# Patient Record
Sex: Female | Born: 1975
Health system: Southern US, Community
[De-identification: ages and names within clinical notes are randomized; demographics above are authoritative.]

## PROBLEM LIST (undated history)

## (undated) DIAGNOSIS — F419 Anxiety disorder, unspecified: Secondary | ICD-10-CM

## (undated) DIAGNOSIS — K589 Irritable bowel syndrome without diarrhea: Secondary | ICD-10-CM

## (undated) DIAGNOSIS — I10 Essential (primary) hypertension: Secondary | ICD-10-CM

## (undated) DIAGNOSIS — K8020 Calculus of gallbladder without cholecystitis without obstruction: Secondary | ICD-10-CM

## (undated) DIAGNOSIS — K269 Duodenal ulcer, unspecified as acute or chronic, without hemorrhage or perforation: Secondary | ICD-10-CM

## (undated) DIAGNOSIS — Z1371 Encounter for nonprocreative screening for genetic disease carrier status: Secondary | ICD-10-CM

## (undated) DIAGNOSIS — T7840XA Allergy, unspecified, initial encounter: Secondary | ICD-10-CM

## (undated) DIAGNOSIS — G43909 Migraine, unspecified, not intractable, without status migrainosus: Secondary | ICD-10-CM

## (undated) DIAGNOSIS — F32A Depression, unspecified: Secondary | ICD-10-CM

## (undated) DIAGNOSIS — K635 Polyp of colon: Secondary | ICD-10-CM

## (undated) DIAGNOSIS — Z803 Family history of malignant neoplasm of breast: Secondary | ICD-10-CM

## (undated) DIAGNOSIS — J40 Bronchitis, not specified as acute or chronic: Secondary | ICD-10-CM

## (undated) DIAGNOSIS — E162 Hypoglycemia, unspecified: Secondary | ICD-10-CM

## (undated) DIAGNOSIS — E785 Hyperlipidemia, unspecified: Secondary | ICD-10-CM

## (undated) DIAGNOSIS — Z800 Family history of malignant neoplasm of digestive organs: Secondary | ICD-10-CM

## (undated) DIAGNOSIS — Z8041 Family history of malignant neoplasm of ovary: Secondary | ICD-10-CM

## (undated) DIAGNOSIS — Z87440 Personal history of urinary (tract) infections: Secondary | ICD-10-CM

## (undated) DIAGNOSIS — K219 Gastro-esophageal reflux disease without esophagitis: Secondary | ICD-10-CM

## (undated) DIAGNOSIS — A048 Other specified bacterial intestinal infections: Secondary | ICD-10-CM

## (undated) DIAGNOSIS — R42 Dizziness and giddiness: Secondary | ICD-10-CM

## (undated) DIAGNOSIS — M1990 Unspecified osteoarthritis, unspecified site: Secondary | ICD-10-CM

## (undated) HISTORY — DX: Bronchitis, not specified as acute or chronic: J40

## (undated) HISTORY — DX: Hyperlipidemia, unspecified: E78.5

## (undated) HISTORY — DX: Family history of malignant neoplasm of breast: Z80.3

## (undated) HISTORY — PX: TUBAL LIGATION: SHX77

## (undated) HISTORY — DX: Family history of malignant neoplasm of digestive organs: Z80.0

## (undated) HISTORY — DX: Family history of malignant neoplasm of ovary: Z80.41

## (undated) HISTORY — PX: TOE SURGERY: SHX1073

## (undated) HISTORY — DX: Anxiety disorder, unspecified: F41.9

## (undated) HISTORY — DX: Irritable bowel syndrome, unspecified: K58.9

## (undated) HISTORY — PX: WISDOM TOOTH EXTRACTION: SHX21

## (undated) HISTORY — DX: Essential (primary) hypertension: I10

## (undated) HISTORY — DX: Encounter for nonprocreative screening for genetic disease carrier status: Z13.71

## (undated) HISTORY — DX: Allergy, unspecified, initial encounter: T78.40XA

## (undated) HISTORY — DX: Depression, unspecified: F32.A

## (undated) HISTORY — PX: BACK SURGERY: SHX140

## (undated) HISTORY — PX: OTHER SURGICAL HISTORY: SHX169

## (undated) HISTORY — PX: CHOLECYSTECTOMY: SHX55

## (undated) HISTORY — DX: Calculus of gallbladder without cholecystitis without obstruction: K80.20

## (undated) HISTORY — DX: Duodenal ulcer, unspecified as acute or chronic, without hemorrhage or perforation: K26.9

## (undated) HISTORY — PX: DIAGNOSTIC LAPAROSCOPY: SUR761

## (undated) HISTORY — PX: APPENDECTOMY: SHX54

## (undated) HISTORY — PX: GALLBLADDER SURGERY: SHX652

## (undated) HISTORY — DX: Unspecified osteoarthritis, unspecified site: M19.90

## (undated) HISTORY — PX: SPINE SURGERY: SHX786

## (undated) HISTORY — DX: Other specified bacterial intestinal infections: A04.8

## (undated) HISTORY — DX: Polyp of colon: K63.5

---

## 2005-12-08 ENCOUNTER — Emergency Department: Payer: Self-pay | Admitting: Emergency Medicine

## 2006-07-04 HISTORY — PX: ABDOMINAL HYSTERECTOMY: SHX81

## 2007-03-28 DIAGNOSIS — G43909 Migraine, unspecified, not intractable, without status migrainosus: Secondary | ICD-10-CM | POA: Insufficient documentation

## 2007-03-28 DIAGNOSIS — F17200 Nicotine dependence, unspecified, uncomplicated: Secondary | ICD-10-CM

## 2008-06-30 ENCOUNTER — Ambulatory Visit: Payer: Self-pay | Admitting: Urology

## 2008-07-13 ENCOUNTER — Ambulatory Visit: Payer: Self-pay | Admitting: Urology

## 2008-09-14 DIAGNOSIS — R945 Abnormal results of liver function studies: Secondary | ICD-10-CM

## 2008-09-19 ENCOUNTER — Ambulatory Visit: Payer: Self-pay | Admitting: Family Medicine

## 2008-10-12 DIAGNOSIS — F341 Dysthymic disorder: Secondary | ICD-10-CM | POA: Insufficient documentation

## 2008-11-15 DIAGNOSIS — M26609 Unspecified temporomandibular joint disorder, unspecified side: Secondary | ICD-10-CM

## 2008-11-15 DIAGNOSIS — E162 Hypoglycemia, unspecified: Secondary | ICD-10-CM | POA: Insufficient documentation

## 2009-02-21 DIAGNOSIS — K921 Melena: Secondary | ICD-10-CM

## 2009-03-30 ENCOUNTER — Ambulatory Visit: Payer: Self-pay | Admitting: Gastroenterology

## 2009-04-13 DIAGNOSIS — N644 Mastodynia: Secondary | ICD-10-CM | POA: Insufficient documentation

## 2009-04-25 ENCOUNTER — Ambulatory Visit: Payer: Self-pay | Admitting: Family Medicine

## 2009-08-07 ENCOUNTER — Emergency Department: Payer: Self-pay | Admitting: Emergency Medicine

## 2009-10-10 DIAGNOSIS — A6920 Lyme disease, unspecified: Secondary | ICD-10-CM

## 2009-12-20 DIAGNOSIS — L679 Hair color and hair shaft abnormality, unspecified: Secondary | ICD-10-CM

## 2010-03-31 ENCOUNTER — Emergency Department: Payer: Self-pay | Admitting: Unknown Physician Specialty

## 2010-08-27 ENCOUNTER — Ambulatory Visit: Payer: Self-pay

## 2010-09-20 ENCOUNTER — Ambulatory Visit: Payer: Self-pay

## 2010-09-26 ENCOUNTER — Ambulatory Visit: Payer: Self-pay

## 2010-11-01 ENCOUNTER — Ambulatory Visit: Payer: Self-pay | Admitting: Podiatry

## 2011-09-03 ENCOUNTER — Emergency Department: Payer: Self-pay | Admitting: Emergency Medicine

## 2011-09-03 LAB — URINALYSIS, COMPLETE
Bilirubin,UR: NEGATIVE
Ph: 6 (ref 4.5–8.0)
RBC,UR: 2 /HPF (ref 0–5)
Squamous Epithelial: 2
WBC UR: 7 /HPF (ref 0–5)

## 2011-09-03 LAB — CBC
MCH: 31.1 pg (ref 26.0–34.0)
MCHC: 33.3 g/dL (ref 32.0–36.0)
RBC: 4.37 10*6/uL (ref 3.80–5.20)
RDW: 12.6 % (ref 11.5–14.5)
WBC: 7.9 10*3/uL (ref 3.6–11.0)

## 2011-09-03 LAB — BASIC METABOLIC PANEL
Anion Gap: 7 (ref 7–16)
BUN: 9 mg/dL (ref 7–18)
Calcium, Total: 8.8 mg/dL (ref 8.5–10.1)
EGFR (African American): >60
EGFR (Non-African Amer.): >60
Glucose: 97 mg/dL (ref 65–99)
Sodium: 140 mmol/L (ref 136–145)

## 2011-11-03 ENCOUNTER — Emergency Department: Payer: Self-pay | Admitting: Emergency Medicine

## 2012-02-07 ENCOUNTER — Emergency Department: Payer: Self-pay | Admitting: Emergency Medicine

## 2012-02-07 LAB — URINALYSIS, COMPLETE
Bilirubin,UR: NEGATIVE
Glucose,UR: NEGATIVE mg/dL (ref 0–75)
Ketone: NEGATIVE
Ph: 6 (ref 4.5–8.0)
Protein: NEGATIVE
RBC,UR: 5 /HPF (ref 0–5)
WBC UR: 134 /HPF (ref 0–5)

## 2012-02-07 LAB — PREGNANCY, URINE: Pregnancy Test, Urine: NEGATIVE m[IU]/mL

## 2012-02-09 LAB — URINE CULTURE

## 2012-05-20 ENCOUNTER — Ambulatory Visit: Payer: Self-pay | Admitting: General Practice

## 2012-08-25 ENCOUNTER — Ambulatory Visit: Payer: Self-pay | Admitting: General Practice

## 2012-10-12 ENCOUNTER — Ambulatory Visit: Payer: Self-pay | Admitting: Pain Medicine

## 2012-10-12 ENCOUNTER — Other Ambulatory Visit: Payer: Self-pay | Admitting: Pain Medicine

## 2012-10-12 LAB — BASIC METABOLIC PANEL
Anion Gap: 6 — ABNORMAL LOW (ref 7–16)
BUN: 11 mg/dL (ref 7–18)
Calcium, Total: 8.9 mg/dL (ref 8.5–10.1)
Chloride: 109 mmol/L — ABNORMAL HIGH (ref 98–107)
Co2: 26 mmol/L (ref 21–32)
Creatinine: 0.61 mg/dL (ref 0.60–1.30)
EGFR (African American): >60
EGFR (Non-African Amer.): >60
Potassium: 3.8 mmol/L (ref 3.5–5.1)

## 2012-10-12 LAB — SEDIMENTATION RATE: Erythrocyte Sed Rate: 8 mm/hr (ref 0–20)

## 2012-10-29 ENCOUNTER — Ambulatory Visit: Payer: Self-pay | Admitting: Pain Medicine

## 2012-11-30 ENCOUNTER — Ambulatory Visit: Payer: Self-pay | Admitting: Pain Medicine

## 2012-12-03 ENCOUNTER — Ambulatory Visit: Payer: Self-pay | Admitting: General Practice

## 2012-12-03 ENCOUNTER — Ambulatory Visit: Payer: Self-pay | Admitting: Pain Medicine

## 2012-12-17 ENCOUNTER — Ambulatory Visit: Payer: Self-pay | Admitting: Pain Medicine

## 2012-12-30 ENCOUNTER — Ambulatory Visit: Payer: Self-pay | Admitting: Pain Medicine

## 2013-01-26 ENCOUNTER — Ambulatory Visit: Payer: Self-pay | Admitting: General Practice

## 2013-03-08 ENCOUNTER — Emergency Department: Payer: Self-pay | Admitting: Emergency Medicine

## 2013-03-10 ENCOUNTER — Encounter: Payer: Self-pay | Admitting: *Deleted

## 2013-03-12 ENCOUNTER — Encounter: Payer: Self-pay | Admitting: Podiatry

## 2013-03-12 ENCOUNTER — Ambulatory Visit (INDEPENDENT_AMBULATORY_CARE_PROVIDER_SITE_OTHER): Payer: PRIVATE HEALTH INSURANCE | Admitting: Podiatry

## 2013-03-12 VITALS — BP 104/63 | HR 66 | Temp 97.8°F | Resp 16 | Ht 67.0 in | Wt 175.1 lb

## 2013-03-12 DIAGNOSIS — M7750 Other enthesopathy of unspecified foot: Secondary | ICD-10-CM

## 2013-03-12 DIAGNOSIS — L600 Ingrowing nail: Secondary | ICD-10-CM

## 2013-03-12 NOTE — Patient Instructions (Signed)
We will call you when orthotics are here

## 2013-03-13 NOTE — Progress Notes (Signed)
Subjective:     Patient ID: [name], female   DOB: Jul 20, 1975, 37 y.o.   MRN: [id number]  HPI patient states that her nails are healing well. States that she does do a lot of pain in her feet when standing or walking   Review of Systems  All other systems reviewed and are negative.       Objective:   Physical Exam  Nursing note and vitals reviewed. Cardiovascular: Intact distal pulses.   Musculoskeletal: Normal range of motion.  Neurological: She is alert.  Skin: Skin is warm.   patient does develop pain in the arch bilateral with palpation and around posterior tibial insertion bilateral nailbeds of crusted over with no redness or drainage     Assessment:     Healing nail sites bilateral. Chronic tendinitis secondary to foot structure bilateral.    Plan:     Reviewed x-rays with the patient explaining foot structure. Recommended orthotics and she was scanned today for custom type orthotic devices. Continue soaks until all existing drainage has ended

## 2013-03-23 ENCOUNTER — Other Ambulatory Visit: Payer: Self-pay | Admitting: Neurological Surgery

## 2013-04-21 ENCOUNTER — Encounter (HOSPITAL_COMMUNITY): Payer: Self-pay | Admitting: Pharmacy Technician

## 2013-04-27 ENCOUNTER — Encounter (HOSPITAL_COMMUNITY)
Admission: RE | Admit: 2013-04-27 | Discharge: 2013-04-27 | Disposition: A | Discharge status: Home / Self Care | Payer: No Typology Code available for payment source | Source: Ambulatory Visit | Attending: Neurological Surgery | Admitting: Neurological Surgery

## 2013-04-27 ENCOUNTER — Other Ambulatory Visit (HOSPITAL_COMMUNITY): Payer: Self-pay | Admitting: *Deleted

## 2013-04-27 ENCOUNTER — Encounter (HOSPITAL_COMMUNITY): Payer: Self-pay

## 2013-04-27 DIAGNOSIS — Z01812 Encounter for preprocedural laboratory examination: Secondary | ICD-10-CM | POA: Insufficient documentation

## 2013-04-27 DIAGNOSIS — Z01818 Encounter for other preprocedural examination: Secondary | ICD-10-CM | POA: Insufficient documentation

## 2013-04-27 HISTORY — DX: Hypoglycemia, unspecified: E16.2

## 2013-04-27 HISTORY — DX: Personal history of urinary (tract) infections: Z87.440

## 2013-04-27 LAB — SURGICAL PCR SCREEN
MRSA, PCR: NEGATIVE
Staphylococcus aureus: NEGATIVE

## 2013-04-27 LAB — TYPE AND SCREEN: Antibody Screen: NEGATIVE

## 2013-04-27 LAB — CBC
Hemoglobin: 13.7 g/dL (ref 12.0–15.0)
MCHC: 34.1 g/dL (ref 30.0–36.0)
Platelets: 309 10*3/uL (ref 150–400)
RBC: 4.42 MIL/uL (ref 3.87–5.11)

## 2013-04-27 NOTE — Pre-Procedure Instructions (Signed)
[name]  04/27/2013   Your procedure is scheduled on:  Thursday, May 06, 2013 at 9:55 AM.   Report to [facility] Entrance "A" at 8:00 AM.   Call this number if you have problems the morning of surgery: [phone]   Remember:   Do not eat food or drink liquids after midnight Wednesday, 05/05/13.   Take these medicines the morning of surgery with A SIP OF WATER: None   Do not wear jewelry, make-up or nail polish.  Do not wear lotions, powders, or perfumes. You may wear deodorant.  Do not shave 48 hours prior to surgery.   Do not bring valuables to the hospital.  [facility] is not responsible                  for any belongings or valuables.               Contacts, dentures or bridgework may not be worn into surgery.  Leave suitcase in the car. After surgery it may be brought to your room.  For patients admitted to the hospital, discharge time is determined by your                treatment team.            Special Instructions: Shower using CHG 2 nights before surgery and the night before surgery.  If you shower the day of surgery use CHG.  Use special wash - you have one bottle of CHG for all showers.  You should use approximately 1/3 of the bottle for each shower.   Please read over the following fact sheets that you were given: Pain Booklet, Coughing and Deep Breathing, Blood Transfusion Information, MRSA Information and Surgical Site Infection Prevention

## 2013-05-04 ENCOUNTER — Ambulatory Visit: Payer: PRIVATE HEALTH INSURANCE | Admitting: Podiatry

## 2013-05-05 MED ORDER — CEFAZOLIN SODIUM-DEXTROSE 2-3 GM-% IV SOLR
2.0000 g | INTRAVENOUS | Status: AC
  Administered 2013-05-06: 2 g via INTRAVENOUS
  Filled 2013-05-05: qty 50

## 2013-05-06 ENCOUNTER — Encounter (HOSPITAL_COMMUNITY)
Admission: RE | Disposition: A | Discharge status: Home / Self Care | Payer: Self-pay | Source: Ambulatory Visit | Attending: Neurological Surgery

## 2013-05-06 ENCOUNTER — Inpatient Hospital Stay (HOSPITAL_COMMUNITY)
Admission: RE | Admit: 2013-05-06 | Discharge: 2013-05-07 | DRG: 460 | Disposition: A | Discharge status: Home / Self Care | Payer: No Typology Code available for payment source | Source: Ambulatory Visit | Attending: Neurological Surgery | Admitting: Neurological Surgery

## 2013-05-06 ENCOUNTER — Encounter (HOSPITAL_COMMUNITY): Payer: Self-pay | Admitting: *Deleted

## 2013-05-06 ENCOUNTER — Encounter (HOSPITAL_COMMUNITY): Payer: No Typology Code available for payment source | Admitting: Certified Registered Nurse Anesthetist

## 2013-05-06 ENCOUNTER — Inpatient Hospital Stay (HOSPITAL_COMMUNITY): Payer: No Typology Code available for payment source | Admitting: Certified Registered Nurse Anesthetist

## 2013-05-06 ENCOUNTER — Inpatient Hospital Stay (HOSPITAL_COMMUNITY): Payer: No Typology Code available for payment source

## 2013-05-06 DIAGNOSIS — K589 Irritable bowel syndrome without diarrhea: Secondary | ICD-10-CM | POA: Diagnosis present

## 2013-05-06 DIAGNOSIS — M51379 Other intervertebral disc degeneration, lumbosacral region without mention of lumbar back pain or lower extremity pain: Secondary | ICD-10-CM | POA: Diagnosis present

## 2013-05-06 DIAGNOSIS — G8929 Other chronic pain: Secondary | ICD-10-CM | POA: Diagnosis present

## 2013-05-06 DIAGNOSIS — Z981 Arthrodesis status: Secondary | ICD-10-CM

## 2013-05-06 DIAGNOSIS — Z9089 Acquired absence of other organs: Secondary | ICD-10-CM

## 2013-05-06 DIAGNOSIS — Z882 Allergy status to sulfonamides status: Secondary | ICD-10-CM

## 2013-05-06 DIAGNOSIS — Z79899 Other long term (current) drug therapy: Secondary | ICD-10-CM

## 2013-05-06 DIAGNOSIS — Z9851 Tubal ligation status: Secondary | ICD-10-CM

## 2013-05-06 DIAGNOSIS — Z87440 Personal history of urinary (tract) infections: Secondary | ICD-10-CM

## 2013-05-06 DIAGNOSIS — F17200 Nicotine dependence, unspecified, uncomplicated: Secondary | ICD-10-CM | POA: Diagnosis present

## 2013-05-06 DIAGNOSIS — M5137 Other intervertebral disc degeneration, lumbosacral region: Secondary | ICD-10-CM | POA: Diagnosis present

## 2013-05-06 DIAGNOSIS — Z820 Family history of epilepsy and other diseases of the nervous system: Secondary | ICD-10-CM

## 2013-05-06 DIAGNOSIS — Z836 Family history of other diseases of the respiratory system: Secondary | ICD-10-CM

## 2013-05-06 DIAGNOSIS — Z8249 Family history of ischemic heart disease and other diseases of the circulatory system: Secondary | ICD-10-CM

## 2013-05-06 DIAGNOSIS — Z888 Allergy status to other drugs, medicaments and biological substances status: Secondary | ICD-10-CM

## 2013-05-06 DIAGNOSIS — M5126 Other intervertebral disc displacement, lumbar region: Principal | ICD-10-CM | POA: Diagnosis present

## 2013-05-06 HISTORY — PX: MAXIMUM ACCESS (MAS)POSTERIOR LUMBAR INTERBODY FUSION (PLIF) 1 LEVEL: SHX6368

## 2013-05-06 LAB — POCT I-STAT 4, (NA,K, GLUC, HGB,HCT)
Glucose, Bld: 123 mg/dL — ABNORMAL HIGH (ref 70–99)
HCT: 39 % (ref 36.0–46.0)
Hemoglobin: 13.3 g/dL (ref 12.0–15.0)
Potassium: 4.5 mEq/L (ref 3.5–5.1)
Sodium: 138 mEq/L (ref 135–145)

## 2013-05-06 SURGERY — FOR MAXIMUM ACCESS (MAS) POSTERIOR LUMBAR INTERBODY FUSION (PLIF) 1 LEVEL
Anesthesia: General | Site: Spine Lumbar

## 2013-05-06 MED ORDER — DEXAMETHASONE 4 MG PO TABS
4.0000 mg | ORAL_TABLET | Freq: Four times a day (QID) | ORAL | Status: DC
  Administered 2013-05-07 (×2): 4 mg via ORAL
  Filled 2013-05-06 (×5): qty 1

## 2013-05-06 MED ORDER — 0.9 % SODIUM CHLORIDE (POUR BTL) OPTIME
TOPICAL | Status: DC | PRN
  Administered 2013-05-06: 1000 mL

## 2013-05-06 MED ORDER — BUPIVACAINE HCL (PF) 0.25 % IJ SOLN
INTRAMUSCULAR | Status: DC | PRN
  Administered 2013-05-06: 2 mL

## 2013-05-06 MED ORDER — SODIUM CHLORIDE 0.9 % IJ SOLN: 3.0000 mL | INTRAMUSCULAR | Status: DC | PRN

## 2013-05-06 MED ORDER — GLYCOPYRROLATE 0.2 MG/ML IJ SOLN
INTRAMUSCULAR | Status: DC | PRN
  Administered 2013-05-06: 0.2 mg via INTRAVENOUS

## 2013-05-06 MED ORDER — HYDROMORPHONE HCL PF 1 MG/ML IJ SOLN
INTRAMUSCULAR | Status: AC
  Filled 2013-05-06: qty 1

## 2013-05-06 MED ORDER — MORPHINE SULFATE 2 MG/ML IJ SOLN
1.0000 mg | INTRAMUSCULAR | Status: DC | PRN
  Administered 2013-05-06: 2 mg via INTRAVENOUS
  Administered 2013-05-06: 4 mg via INTRAVENOUS
  Filled 2013-05-06: qty 2
  Filled 2013-05-06: qty 1

## 2013-05-06 MED ORDER — LIDOCAINE HCL (CARDIAC) 20 MG/ML IV SOLN
INTRAVENOUS | Status: DC | PRN
  Administered 2013-05-06: 30 mg via INTRAVENOUS

## 2013-05-06 MED ORDER — ZOLPIDEM TARTRATE 5 MG PO TABS
5.0000 mg | ORAL_TABLET | Freq: Every evening | ORAL | Status: DC | PRN
  Administered 2013-05-06: 5 mg via ORAL
  Filled 2013-05-06: qty 1

## 2013-05-06 MED ORDER — LACTATED RINGERS IV SOLN
INTRAVENOUS | Status: DC
  Administered 2013-05-06 (×2): via INTRAVENOUS

## 2013-05-06 MED ORDER — ONDANSETRON HCL 4 MG/2ML IJ SOLN: 4.0000 mg | Freq: Once | INTRAMUSCULAR | Status: DC | PRN

## 2013-05-06 MED ORDER — ARTIFICIAL TEARS OP OINT
TOPICAL_OINTMENT | OPHTHALMIC | Status: DC | PRN
  Administered 2013-05-06: 1 via OPHTHALMIC

## 2013-05-06 MED ORDER — SODIUM CHLORIDE 0.9 % IR SOLN
Status: DC | PRN
  Administered 2013-05-06: 11:00:00

## 2013-05-06 MED ORDER — DEXMEDETOMIDINE HCL IN NACL 400 MCG/100ML IV SOLN: 0.4000 ug/kg/h | INTRAVENOUS | Status: DC

## 2013-05-06 MED ORDER — THROMBIN 20000 UNITS EX SOLR
CUTANEOUS | Status: DC | PRN
  Administered 2013-05-06: 20000 [IU] via TOPICAL

## 2013-05-06 MED ORDER — ONDANSETRON HCL 4 MG/2ML IJ SOLN
4.0000 mg | INTRAMUSCULAR | Status: DC | PRN
  Administered 2013-05-06: 4 mg via INTRAVENOUS
  Filled 2013-05-06: qty 2

## 2013-05-06 MED ORDER — POTASSIUM CHLORIDE IN NACL 20-0.9 MEQ/L-% IV SOLN
INTRAVENOUS | Status: DC
  Administered 2013-05-06: 19:00:00 via INTRAVENOUS
  Filled 2013-05-06 (×3): qty 1000

## 2013-05-06 MED ORDER — SUCCINYLCHOLINE CHLORIDE 20 MG/ML IJ SOLN
INTRAMUSCULAR | Status: DC | PRN
  Administered 2013-05-06: 120 mg via INTRAVENOUS

## 2013-05-06 MED ORDER — SODIUM CHLORIDE 0.9 % IJ SOLN
3.0000 mL | Freq: Two times a day (BID) | INTRAMUSCULAR | Status: DC
  Administered 2013-05-06 – 2013-05-07 (×2): 3 mL via INTRAVENOUS

## 2013-05-06 MED ORDER — DIAZEPAM 5 MG/ML IJ SOLN
2.5000 mg | Freq: Three times a day (TID) | INTRAMUSCULAR | Status: DC | PRN
  Administered 2013-05-06: 2.5 mg via INTRAVENOUS

## 2013-05-06 MED ORDER — MIDAZOLAM HCL 5 MG/5ML IJ SOLN
INTRAMUSCULAR | Status: DC | PRN
  Administered 2013-05-06: 2 mg via INTRAVENOUS

## 2013-05-06 MED ORDER — DEXAMETHASONE SODIUM PHOSPHATE 4 MG/ML IJ SOLN
4.0000 mg | Freq: Four times a day (QID) | INTRAMUSCULAR | Status: DC
  Administered 2013-05-06 – 2013-05-07 (×2): 4 mg via INTRAVENOUS
  Filled 2013-05-06 (×6): qty 1

## 2013-05-06 MED ORDER — PROPOFOL 10 MG/ML IV BOLUS
INTRAVENOUS | Status: DC | PRN
  Administered 2013-05-06: 150 mg via INTRAVENOUS

## 2013-05-06 MED ORDER — DEXAMETHASONE SODIUM PHOSPHATE 10 MG/ML IJ SOLN
INTRAMUSCULAR | Status: AC
  Administered 2013-05-06: 10 mg via INTRAVENOUS
  Filled 2013-05-06: qty 1

## 2013-05-06 MED ORDER — CEFAZOLIN SODIUM 1-5 GM-% IV SOLN
1.0000 g | Freq: Three times a day (TID) | INTRAVENOUS | Status: AC
  Administered 2013-05-06 – 2013-05-07 (×2): 1 g via INTRAVENOUS
  Filled 2013-05-06 (×2): qty 50

## 2013-05-06 MED ORDER — METHOCARBAMOL 500 MG PO TABS
ORAL_TABLET | ORAL | Status: AC
  Filled 2013-05-06: qty 1

## 2013-05-06 MED ORDER — DIAZEPAM 5 MG/ML IJ SOLN
INTRAMUSCULAR | Status: AC
  Filled 2013-05-06: qty 2

## 2013-05-06 MED ORDER — HYDROMORPHONE HCL PF 1 MG/ML IJ SOLN
INTRAMUSCULAR | Status: AC
  Administered 2013-05-06: 1 mg
  Filled 2013-05-06: qty 1

## 2013-05-06 MED ORDER — OXYCODONE-ACETAMINOPHEN 5-325 MG PO TABS
ORAL_TABLET | ORAL | Status: AC
  Filled 2013-05-06: qty 2

## 2013-05-06 MED ORDER — NICOTINE 21 MG/24HR TD PT24
21.0000 mg | MEDICATED_PATCH | Freq: Every day | TRANSDERMAL | Status: DC
  Administered 2013-05-06 – 2013-05-07 (×2): 21 mg via TRANSDERMAL
  Filled 2013-05-06 (×2): qty 1

## 2013-05-06 MED ORDER — THROMBIN 5000 UNITS EX SOLR
CUTANEOUS | Status: DC | PRN
  Administered 2013-05-06: 5000 [IU] via TOPICAL

## 2013-05-06 MED ORDER — FENTANYL CITRATE 0.05 MG/ML IJ SOLN
INTRAMUSCULAR | Status: DC | PRN
  Administered 2013-05-06 (×3): 50 ug via INTRAVENOUS
  Administered 2013-05-06: 100 ug via INTRAVENOUS

## 2013-05-06 MED ORDER — ACETAMINOPHEN 650 MG RE SUPP: 650.0000 mg | RECTAL | Status: DC | PRN

## 2013-05-06 MED ORDER — OXYCODONE-ACETAMINOPHEN 5-325 MG PO TABS
1.0000 | ORAL_TABLET | ORAL | Status: DC | PRN
  Administered 2013-05-06 – 2013-05-07 (×5): 2 via ORAL
  Filled 2013-05-06 (×4): qty 2

## 2013-05-06 MED ORDER — DIAZEPAM 5 MG/ML IJ SOLN
2.5000 mg | Freq: Once | INTRAMUSCULAR | Status: AC
  Administered 2013-05-06: 2.5 mg via INTRAVENOUS

## 2013-05-06 MED ORDER — METHOCARBAMOL 100 MG/ML IJ SOLN
500.0000 mg | Freq: Four times a day (QID) | INTRAVENOUS | Status: DC | PRN
  Filled 2013-05-06: qty 5

## 2013-05-06 MED ORDER — HEMOSTATIC AGENTS (NO CHARGE) OPTIME
TOPICAL | Status: DC | PRN
  Administered 2013-05-06: 1 via TOPICAL

## 2013-05-06 MED ORDER — PHENOL 1.4 % MT LIQD: 1.0000 | OROMUCOSAL | Status: DC | PRN

## 2013-05-06 MED ORDER — SODIUM CHLORIDE 0.9 % IV SOLN
10.0000 mg | INTRAVENOUS | Status: DC | PRN
  Administered 2013-05-06: 20 ug/min via INTRAVENOUS

## 2013-05-06 MED ORDER — METHOCARBAMOL 500 MG PO TABS
500.0000 mg | ORAL_TABLET | Freq: Four times a day (QID) | ORAL | Status: DC | PRN
  Administered 2013-05-06 – 2013-05-07 (×3): 500 mg via ORAL
  Filled 2013-05-06 (×3): qty 1

## 2013-05-06 MED ORDER — ACETAMINOPHEN 325 MG PO TABS: 650.0000 mg | ORAL_TABLET | ORAL | Status: DC | PRN

## 2013-05-06 MED ORDER — DEXMEDETOMIDINE HCL IN NACL 400 MCG/100ML IV SOLN
0.4000 ug/kg/h | INTRAVENOUS | Status: AC
  Administered 2013-05-06: 0.5 ug/kg/h via INTRAVENOUS
  Filled 2013-05-06: qty 100

## 2013-05-06 MED ORDER — HYDROMORPHONE HCL PF 1 MG/ML IJ SOLN
0.2500 mg | INTRAMUSCULAR | Status: DC | PRN
  Administered 2013-05-06 (×2): 0.5 mg via INTRAVENOUS

## 2013-05-06 MED ORDER — EPHEDRINE SULFATE 50 MG/ML IJ SOLN
INTRAMUSCULAR | Status: DC | PRN
  Administered 2013-05-06: 5 mg via INTRAVENOUS

## 2013-05-06 MED ORDER — MENTHOL 3 MG MT LOZG
1.0000 | LOZENGE | OROMUCOSAL | Status: DC | PRN
  Administered 2013-05-07: 3 mg via ORAL
  Filled 2013-05-06: qty 9

## 2013-05-06 MED ORDER — SENNA 8.6 MG PO TABS
1.0000 | ORAL_TABLET | Freq: Two times a day (BID) | ORAL | Status: DC
  Administered 2013-05-06 – 2013-05-07 (×2): 8.6 mg via ORAL
  Filled 2013-05-06 (×3): qty 1

## 2013-05-06 SURGICAL SUPPLY — 63 items
BAG DECANTER FOR FLEXI CONT (MISCELLANEOUS) ×2 IMPLANT
BENZOIN TINCTURE PRP APPL 2/3 (GAUZE/BANDAGES/DRESSINGS) ×2 IMPLANT
BLADE SURG ROTATE 9660 (MISCELLANEOUS) IMPLANT
BONE MATRIX OSTEOCEL PRO MED (Bone Implant) ×2 IMPLANT
BUR MATCHSTICK NEURO 3.0 LAGG (BURR) ×2 IMPLANT
CAGE COROENT MP 8X9X23M-8 SPIN (Cage) ×4 IMPLANT
CANISTER SUCT 3000ML (MISCELLANEOUS) ×2 IMPLANT
CLIP NEUROVISION LG (CLIP) ×2 IMPLANT
CONT SPEC 4OZ CLIKSEAL STRL BL (MISCELLANEOUS) ×4 IMPLANT
COVER BACK TABLE 24X17X13 BIG (DRAPES) IMPLANT
COVER TABLE BACK 60X90 (DRAPES) ×2 IMPLANT
DRAPE C-ARM 42X72 X-RAY (DRAPES) ×2 IMPLANT
DRAPE C-ARMOR (DRAPES) ×2 IMPLANT
DRAPE LAPAROTOMY 100X72X124 (DRAPES) ×2 IMPLANT
DRAPE POUCH INSTRU U-SHP 10X18 (DRAPES) ×2 IMPLANT
DRAPE SURG 17X23 STRL (DRAPES) ×2 IMPLANT
DRESSING TELFA 8X3 (GAUZE/BANDAGES/DRESSINGS) ×2 IMPLANT
DRSG OPSITE 4X5.5 SM (GAUZE/BANDAGES/DRESSINGS) ×4 IMPLANT
DURAPREP 26ML APPLICATOR (WOUND CARE) ×2 IMPLANT
ELECT REM PT RETURN 9FT ADLT (ELECTROSURGICAL) ×2
ELECTRODE REM PT RTRN 9FT ADLT (ELECTROSURGICAL) ×1 IMPLANT
EVACUATOR 1/8 PVC DRAIN (DRAIN) ×2 IMPLANT
GAUZE SPONGE 4X4 16PLY XRAY LF (GAUZE/BANDAGES/DRESSINGS) IMPLANT
GLOVE BIO SURGEON STRL SZ8 (GLOVE) ×4 IMPLANT
GLOVE BIOGEL PI IND STRL 7.0 (GLOVE) ×3 IMPLANT
GLOVE BIOGEL PI IND STRL 7.5 (GLOVE) ×1 IMPLANT
GLOVE BIOGEL PI INDICATOR 7.0 (GLOVE) ×3
GLOVE BIOGEL PI INDICATOR 7.5 (GLOVE) ×1
GLOVE ECLIPSE 7.5 STRL STRAW (GLOVE) ×6 IMPLANT
GLOVE SS BIOGEL STRL SZ 6.5 (GLOVE) ×2 IMPLANT
GLOVE SUPERSENSE BIOGEL SZ 6.5 (GLOVE) ×2
GOWN BRE IMP SLV AUR LG STRL (GOWN DISPOSABLE) ×4 IMPLANT
GOWN BRE IMP SLV AUR XL STRL (GOWN DISPOSABLE) ×6 IMPLANT
GOWN STRL REIN 2XL LVL4 (GOWN DISPOSABLE) IMPLANT
HEMOSTAT POWDER KIT SURGIFOAM (HEMOSTASIS) ×2 IMPLANT
KIT BASIN OR (CUSTOM PROCEDURE TRAY) ×2 IMPLANT
KIT ROOM TURNOVER OR (KITS) ×2 IMPLANT
MILL MEDIUM DISP (BLADE) ×2 IMPLANT
NEEDLE HYPO 25X1 1.5 SAFETY (NEEDLE) ×2 IMPLANT
NS IRRIG 1000ML POUR BTL (IV SOLUTION) ×2 IMPLANT
PACK LAMINECTOMY NEURO (CUSTOM PROCEDURE TRAY) ×2 IMPLANT
PAD ARMBOARD 7.5X6 YLW CONV (MISCELLANEOUS) ×10 IMPLANT
ROD 35MM (Rod) ×4 IMPLANT
SCREW LOCK (Screw) ×4 IMPLANT
SCREW LOCK FXNS SPNE MAS PL (Screw) ×4 IMPLANT
SCREW SHANK 5.0X30MM (Screw) ×2 IMPLANT
SCREW SHANK 6.5X30 (Screw) ×4 IMPLANT
SCREW SHANK PLIF 5.0X25 LUMBAR (Screw) ×2 IMPLANT
SCREW TULIP 5.5 (Screw) ×8 IMPLANT
SPONGE GAUZE 4X4 12PLY (GAUZE/BANDAGES/DRESSINGS) ×2 IMPLANT
SPONGE LAP 4X18 X RAY DECT (DISPOSABLE) IMPLANT
SPONGE SURGIFOAM ABS GEL 100 (HEMOSTASIS) ×2 IMPLANT
STRIP CLOSURE SKIN 1/2X4 (GAUZE/BANDAGES/DRESSINGS) ×4 IMPLANT
SUT VIC AB 0 CT1 18XCR BRD8 (SUTURE) ×1 IMPLANT
SUT VIC AB 0 CT1 8-18 (SUTURE) ×1
SUT VIC AB 2-0 CP2 18 (SUTURE) ×2 IMPLANT
SUT VIC AB 3-0 SH 8-18 (SUTURE) ×4 IMPLANT
SYR 20ML ECCENTRIC (SYRINGE) ×2 IMPLANT
SYR 3ML LL SCALE MARK (SYRINGE) IMPLANT
TOWEL OR 17X24 6PK STRL BLUE (TOWEL DISPOSABLE) ×2 IMPLANT
TOWEL OR 17X26 10 PK STRL BLUE (TOWEL DISPOSABLE) ×2 IMPLANT
TRAY FOLEY CATH 14FRSI W/METER (CATHETERS) ×2 IMPLANT
WATER STERILE IRR 1000ML POUR (IV SOLUTION) ×2 IMPLANT

## 2013-05-06 NOTE — Plan of Care (Signed)
Problem: Consults Goal: Diagnosis - Spinal Surgery Outcome: Completed/Met Date Met:  05/06/13 Thoraco/Lumbar Spine Fusion     

## 2013-05-06 NOTE — Preoperative (Signed)
Beta Blockers   Reason not to administer Beta Blockers:Not Applicable 

## 2013-05-06 NOTE — Anesthesia Postprocedure Evaluation (Signed)
  Anesthesia Post-op Note  Patient: [name]  Procedure(s) Performed: Procedure(s): LUMBAR FIVE-SACRAL ONE MAXIMUM ACCESS POSTERIOR LUMBAR INTERBODY FUSION (N/A)  Patient Location: PACU  Anesthesia Type:General  Level of Consciousness: awake, alert  and oriented  Airway and Oxygen Therapy: Patient Spontanous Breathing  Post-op Pain: mild  Post-op Assessment: Post-op Vital signs reviewed, Patient's Cardiovascular Status Stable, Respiratory Function Stable, Patent Airway and Pain level controlled  Post-op Vital Signs: stable  Complications: No apparent anesthesia complications

## 2013-05-06 NOTE — Op Note (Signed)
05/06/2013  12:54 PM  PATIENT:  [name]  37 y.o. female  PRE-OPERATIVE DIAGNOSIS:  Degenerative disc disease with disc herniation L5-S1 with chronic back pain and left leg pain  POST-OPERATIVE DIAGNOSIS:  Same  PROCEDURE:   1. Decompressive lumbar laminectomy L5-S1 requiring more work than would be required for a simple exposure of the disk for PLIF in order to adequately decompress the neural elements and address the spinal stenosis 2. Posterior lumbar interbody fusion L5-S1 using PEEK interbody cages packed with morcellized allograft and autograft 3. Posterior fixation L5-S1 using cortical pedicle screws.    SURGEON:  Marikay Alar, MD  ASSISTANTS: Dr. Phoebe Perch  ANESTHESIA:  General  EBL: 200 ml  Total I/O In: 1000 [I.V.:1000] Out: 625 [Urine:425; Blood:200]  BLOOD ADMINISTERED:none  DRAINS: Hemovac   INDICATION FOR PROCEDURE: This patient presented with a 10 year history of progressive low back pain. She had left leg pain. She had an MRI which showed degenerative disc disease L5-S1 with collapse the disc space and Modic endplate changes as well as a left-sided disc protrusion. Recommended a decompression and instrumented fusion. Patient understood the risks, benefits, and alternatives and potential outcomes and wished to proceed.  PROCEDURE DETAILS:  The patient was brought to the operating room. After induction of generalized endotracheal anesthesia the patient was rolled into the prone position on chest rolls and all pressure points were padded. The patient's lumbar region was cleaned and then prepped with DuraPrep and draped in the usual sterile fashion. Anesthesia was injected and then a dorsal midline incision was made and carried down to the lumbosacral fascia. The fascia was opened and the paraspinous musculature was taken down in a subperiosteal fashion to expose L5-S1. A self-retaining retractor was placed. Intraoperative fluoroscopy confirmed my level, and I  started with placement of the L5 cortical pedicle screws. The pedicle screw entry zones were identified utilizing surface landmarks and  AP and lateral fluoroscopy. I scored the cortex with the high-speed drill and then used the hand drill and EMG monitoring to drill an upward and outward direction into the pedicle. I then tapped line to line, and the tap was also monitored. I then placed a 5-0 x 30 mm cortical pedicle screw into the pedicles of L5 bilaterally. I then turned my attention to the decompression and the spinous process was removed and complete lumbar laminectomies, hemi- facetectomies, and foraminotomies were performed at L5-S1. The patient had significant spinal stenosis and this required more work than would be required for a simple exposure of the disc for posterior lumbar interbody fusion. Much more generous decompression was undertaken in order to adequately decompress the neural elements and address the patient's leg pain. The yellow ligament was removed to expose the underlying dura and nerve roots, and generous foraminotomies were performed to adequately decompress the neural elements. Both the exiting and traversing nerve roots were decompressed on both sides until a coronary dilator passed easily along the nerve roots. Once the decompression was complete, I turned my attention to the posterior lower lumbar interbody fusion. The epidural venous vasculature was coagulated and cut sharply. Disc space was incised and the initial discectomy was performed with pituitary rongeurs. The disc space was distracted with sequential distractors to a height of 9 mm. We then used a series of scrapers and shavers to prepare the endplates for fusion. The midline was prepared with Epstein curettes. Once the complete discectomy was finished, we packed an appropriate sized peek interbody cage with local autograft and morcellized  allograft, gently retracted the nerve root, and tapped the cage into position at  L5-S1.  The midline between the cages was packed with morselized autograft and allograft. We then turned our attention to the placement of the lower pedicle screws. The pedicle screw entry zones were identified utilizing surface landmarks and fluoroscopy. I drilled into each pedicle utilizing the hand drill and EMG monitoring, and tapped each pedicle with the appropriate tap. We palpated with a ball probe to assure no break in the cortex. We then placed 6-0 x 30 mm pedicle screws into the pedicles bilaterally at S1.  We then placed lordotic rods into the multiaxial screw heads of the pedicle screws and locked these in position with the locking caps and anti-torque device. We then checked our construct with AP and lateral fluoroscopy. Irrigated with copious amounts of bacitracin-containing saline solution. Placed a medium Hemovac drain through separate stab incision. Inspected the nerve roots once again to assure adequate decompression, lined to the dura with Gelfoam, and closed the muscle and the fascia with 0 Vicryl. Closed the subcutaneous tissues with 2-0 Vicryl and subcuticular tissues with 3-0 Vicryl. The skin was closed with benzoin and Steri-Strips. Dressing was then applied, the patient was awakened from general anesthesia and transported to the recovery room in stable condition. At the end of the procedure all sponge, needle and instrument counts were correct.   PLAN OF CARE: Admit to inpatient   PATIENT DISPOSITION:  PACU - hemodynamically stable.   Delay start of Pharmacological VTE agent (>24hrs) due to surgical blood loss or risk of bleeding:  yes

## 2013-05-06 NOTE — Transfer of Care (Signed)
Immediate Anesthesia Transfer of Care Note  Patient: [name]  Procedure(s) Performed: Procedure(s): LUMBAR FIVE-SACRAL ONE MAXIMUM ACCESS POSTERIOR LUMBAR INTERBODY FUSION (N/A)  Patient Location: PACU  Anesthesia Type:General  Level of Consciousness: awake, alert , oriented and patient cooperative  Airway & Oxygen Therapy: Patient Spontanous Breathing  Post-op Assessment: Report given to PACU RN, Post -op Vital signs reviewed and stable and Patient moving all extremities X 4  Post vital signs: Reviewed and stable  Complications: No apparent anesthesia complications

## 2013-05-06 NOTE — Progress Notes (Signed)
Report given to Dorma Altman rn as caregiver 

## 2013-05-06 NOTE — Anesthesia Preprocedure Evaluation (Addendum)
Anesthesia Evaluation  Patient identified by MRN, date of birth, ID band Patient awake    Reviewed: Allergy & Precautions, H&P , NPO status   Airway Mallampati: II TM Distance: >3 FB Neck ROM: Full    Dental  (+) Teeth Intact and Dental Advisory Given   Pulmonary Current Smoker,  breath sounds clear to auscultation        Cardiovascular Rhythm:Regular Rate:Normal     Neuro/Psych    GI/Hepatic   Endo/Other    Renal/GU      Musculoskeletal   Abdominal   Peds  Hematology   Anesthesia Other Findings   Reproductive/Obstetrics                          Anesthesia Physical Anesthesia Plan  ASA: II  Anesthesia Plan: General   Post-op Pain Management:    Induction: Intravenous  Airway Management Planned: Oral ETT  Additional Equipment:   Intra-op Plan:   Post-operative Plan: Extubation in OR  Informed Consent: I have reviewed the patients History and Physical, chart, labs and discussed the procedure including the risks, benefits and alternatives for the proposed anesthesia with the patient or authorized representative who has indicated his/her understanding and acceptance.   Dental advisory given  Plan Discussed with: CRNA and Anesthesiologist  Anesthesia Plan Comments: (HNP L5-S1 Smoker  Plan GA with oral ETT  Kipp Brood, MD)        Anesthesia Quick Evaluation

## 2013-05-06 NOTE — H&P (Signed)
Subjective: Patient is a 37 y.o. female admitted for PLIF L5-S1. Onset of symptoms was several months ago, gradually worsening since that time.  The pain is rated severe, and is located at the across the lower back and radiates to legs. The pain is described as aching and occurs all day. The symptoms have been progressive. Symptoms are exacerbated by exercise. MRI or CT showed DDD with HNP L5-S1.   Past Medical History  Diagnosis Date  . IBS (irritable bowel syndrome)   . Hypoglycemia   . History of frequent urinary tract infections     Past Surgical History  Procedure Laterality Date  . Abdominal hysterectomy      PARTIAL  . Gallbladder surgery    . Toe surgery    . Wisdom tooth extraction    . Diagnostic laparoscopy      test on bladder  . Tubal ligation    . Cholecystectomy      Prior to Admission medications   Not on File   Allergies  Allergen Reactions  . Codeine Nausea And Vomiting  . Sulfa Antibiotics Rash  . Vicodin [Hydrocodone-Acetaminophen] Rash    And itching all over    History  Substance Use Topics  . Smoking status: Current Every Day Smoker -- 1.00 packs/day for 20 years    Types: Cigarettes  . Smokeless tobacco: Never Used  . Alcohol Use: No     Comment: very rarely    Family History  Problem Relation Age of Onset  . Hypertension Mother   . COPD Mother   . Epilepsy Mother   . Hypertension Father      Review of Systems  Positive ROS: neg  All other systems have been reviewed and were otherwise negative with the exception of those mentioned in the HPI and as above.  Objective: Vital signs in last 24 hours: Temp:  [97.7 F (36.5 C)] 97.7 F (36.5 C) (12/04 0803) Pulse Rate:  [65] 65 (12/04 0803) Resp:  [20] 20 (12/04 0803) BP: (112)/(69) 112/69 mmHg (12/04 0803) SpO2:  [100 %] 100 % (12/04 0803)  General Appearance: Alert, cooperative, no distress, appears stated age Head: Normocephalic, without obvious abnormality, atraumatic Eyes:  PERRL, conjunctiva/corneas clear, EOM's intact    Neck: Supple, symmetrical, trachea midline Back: Symmetric, no curvature, ROM normal, no CVA tenderness Lungs:  respirations unlabored Heart: Regular rate and rhythm Abdomen: Soft, non-tender Extremities: Extremities normal, atraumatic, no cyanosis or edema Pulses: 2+ and symmetric all extremities Skin: Skin color, texture, turgor normal, no rashes or lesions  NEUROLOGIC:   Mental status: Alert and oriented x4,  no aphasia, good attention span, fund of knowledge, and memory Motor Exam - grossly normal Sensory Exam - grossly normal Reflexes: 1+ Coordination - grossly normal Gait - grossly normal Balance - grossly normal Cranial Nerves: I: smell Not tested  II: visual acuity  OS: nl    OD: nl  II: visual fields Full to confrontation  II: pupils Equal, round, reactive to light  III,VII: ptosis None  III,IV,VI: extraocular muscles  Full ROM  V: mastication Normal  V: facial light touch sensation  Normal  V,VII: corneal reflex  Present  VII: facial muscle function - upper  Normal  VII: facial muscle function - lower Normal  VIII: hearing Not tested  IX: soft palate elevation  Normal  IX,X: gag reflex Present  XI: trapezius strength  5/5  XI: sternocleidomastoid strength 5/5  XI: neck flexion strength  5/5  XII: tongue strength  Normal  Data Review Lab Results  Component Value Date   WBC 8.2 04/27/2013   HGB 13.7 04/27/2013   HCT 40.2 04/27/2013   MCV 91.0 04/27/2013   PLT 309 04/27/2013   No results found for this basename: NA, K, CL, CO2, BUN, creatinine, glucose   No results found for this basename: INR, PROTIME    Assessment/Plan: Patient admitted for PLIF L5-S1. Patient has failed a reasonable attempt at conservative therapy.  I explained the condition and procedure to the patient and answered any questions.  Patient wishes to proceed with procedure as planned. Understands risks/ benefits and typical outcomes  of procedure.   Dandrea Medders S 05/06/2013 9:20 AM

## 2013-05-06 NOTE — Progress Notes (Signed)
Orthopedic Tech Progress Note Patient Details:  [name] 1975/09/23 [id number]  Ortho Devices Type of Ortho Device: Lumbar corsett Ortho Device/Splint Interventions: Ordered   [name] 05/06/2013, 3:48 PM

## 2013-05-07 ENCOUNTER — Encounter (HOSPITAL_COMMUNITY): Payer: Self-pay | Admitting: Neurological Surgery

## 2013-05-07 MED ORDER — METHOCARBAMOL 500 MG PO TABS: 500.0000 mg | ORAL_TABLET | Freq: Four times a day (QID) | ORAL | Status: DC | PRN

## 2013-05-07 MED ORDER — FLUCONAZOLE 150 MG PO TABS
150.0000 mg | ORAL_TABLET | Freq: Once | ORAL | Status: AC
  Administered 2013-05-07: 150 mg via ORAL
  Filled 2013-05-07: qty 1

## 2013-05-07 MED ORDER — OXYCODONE-ACETAMINOPHEN 5-325 MG PO TABS: 1.0000 | ORAL_TABLET | ORAL | Status: DC | PRN

## 2013-05-07 NOTE — Progress Notes (Signed)
Orthopedic Tech Progress Note Patient Details:  [name] May 05, 1976 [id number]  Patient ID: [name], female   DOB: 05/21/1976, 37 y.o.   MRN: [id number]   [name] 05/07/2013, 8:46 [facility] bio-tech for lumbar corset.

## 2013-05-07 NOTE — Discharge Summary (Signed)
Physician Discharge Summary  Patient ID: [name] MRN: [id number] DOB/AGE: [birth year] 35 y.o.  Admit date: 05/06/2013 Discharge date: 05/07/2013  Admission Diagnoses: DDD/ HNP L5-S1   Discharge Diagnoses: same   Discharged Condition: good  Hospital Course: The patient was admitted on 05/06/2013 and taken to the operating room where the patient underwent PLIF L5-S1. The patient tolerated the procedure well and was taken to the recovery room and then to the floor in stable condition. The hospital course was routine. There were no complications. The wound remained clean dry and intact. Pt had appropriate back soreness. No complaints of leg pain or new N/T/W. The patient remained afebrile with stable vital signs, and tolerated a regular diet. The patient continued to increase activities, and pain was well controlled with oral pain medications.   Consults: None  Significant Diagnostic Studies:  Results for orders placed during the hospital encounter of 05/06/13  POCT I-STAT 4, (NA,K, GLUC, HGB,HCT)      Result Value Range   Sodium 138  135 - 145 mEq/L   Potassium 4.5  3.5 - 5.1 mEq/L   Glucose, Bld 123 (*) 70 - 99 mg/dL   HCT 30.8  65.7 - 84.6 %   Hemoglobin 13.3  12.0 - 15.0 g/dL    Dg Lumbar Spine 2-3 Views  05/06/2013   CLINICAL DATA:  L5-S1 posterior lumbar interbody fusion  EXAM: LUMBAR SPINE - 2-3 VIEW; DG C-ARM 1-60 MIN  COMPARISON:  None.  FINDINGS: Frontal and a lateral intraoperative radiographs demonstrate L5-S1 posterior lumbar interbody fusion with bilateral pedicle screw and rod construct. An interbody graft is present at the L5-S1 level. Alignment appears near anatomic. No immediate hardware complication.  IMPRESSION: Intraoperative radiographs as above.   Electronically Signed   By: Malachy Moan M.D.   On: 05/06/2013 14:42   Dg C-arm 1-60 Min  05/06/2013   CLINICAL DATA:  L5-S1 posterior lumbar interbody fusion  EXAM: LUMBAR SPINE - 2-3 VIEW; DG C-ARM 1-60  MIN  COMPARISON:  None.  FINDINGS: Frontal and a lateral intraoperative radiographs demonstrate L5-S1 posterior lumbar interbody fusion with bilateral pedicle screw and rod construct. An interbody graft is present at the L5-S1 level. Alignment appears near anatomic. No immediate hardware complication.  IMPRESSION: Intraoperative radiographs as above.   Electronically Signed   By: Malachy Moan M.D.   On: 05/06/2013 14:42    Antibiotics:  Anti-infectives   Start     Dose/Rate Route Frequency Ordered Stop   05/06/13 1730  ceFAZolin (ANCEF) IVPB 1 g/50 mL premix     1 g 100 mL/hr over 30 Minutes Intravenous Every 8 hours 05/06/13 1640 05/07/13 0233   05/06/13 1113  bacitracin 50,000 Units in sodium chloride irrigation 0.9 % 500 mL irrigation  Status:  Discontinued       As needed 05/06/13 1114 05/06/13 1250   05/06/13 0600  ceFAZolin (ANCEF) IVPB 2 g/50 mL premix     2 g 100 mL/hr over 30 Minutes Intravenous On call to O.R. 05/05/13 1422 05/06/13 1018      Discharge Exam: Blood pressure 89/55, pulse 71, temperature 98.9 F (37.2 C), temperature source Oral, resp. rate 16, height 5' 6.93" (1.7 m), weight 80 kg (176 lb 5.9 oz), SpO2 97.00%. Neurologic: Grossly normal Incision CDI  Discharge Medications:     Medication List         methocarbamol 500 MG tablet  Commonly known as:  ROBAXIN  Take 1 tablet (500 mg total) by mouth every 6 (  six) hours as needed for muscle spasms.     oxyCODONE-acetaminophen 5-325 MG per tablet  Commonly known as:  PERCOCET/ROXICET  Take 1-2 tablets by mouth every 4 (four) hours as needed for moderate pain.        Disposition: home   Final Dx: PLIF L5-S1      Discharge Orders   Future Orders Complete By Expires    Remove dressing in 72 hours  As directed    Scheduling Instructions:     Leave steri strips in place   Call MD for:  difficulty breathing, headache or visual disturbances  As directed    Call MD for:  persistant nausea and vomiting   As directed    Call MD for:  redness, tenderness, or signs of infection (pain, swelling, redness, odor or green/yellow discharge around incision site)  As directed    Call MD for:  severe uncontrolled pain  As directed    Call MD for:  temperature >100.4  As directed    Diet - low sodium heart healthy  As directed    Discharge instructions  As directed    Comments:     May shower, no driving, no bending or twisting, no lifting more than 10 lbs   Increase activity slowly  As directed       Follow-up Information   Follow up with Marissah Vandemark S, MD. Schedule an appointment as soon as possible for a visit in 2 weeks.   Specialty:  Neurosurgery   Contact information:   [address] [place] [address] [phone]        Signed: Tia Alert 05/07/2013, 7:59 AM

## 2013-05-07 NOTE — Progress Notes (Signed)
UR complete 

## 2013-05-07 NOTE — Evaluation (Signed)
Physical Therapy Evaluation Patient Details Name: [name] MRN: [id number] DOB: [birth year] Today's Date: 05/07/2013 Time: 4098-1191 PT Time Calculation (min): 16 min  PT Assessment / Plan / Recommendation History of Present Illness  Patient is a 37 y.o. female admitted for PLIF L5-S1. Onset of symptoms was several months ago, gradually worsening since that time.  The pain is rated severe, and is located at the across the lower back and radiates to legs. The pain is described as aching and occurs all day. The symptoms have been progressive. Symptoms are exacerbated by exercise. MRI or CT showed DDD with HNP L5-S1.   Clinical Impression  Patient is s/p L5S1 surgery resulting in functional deficits in mobility and daily activities. Patient received education about back precautions and dangerous activities to avoid.  Patient was understanding of importance and agrees to following the precautions. Patient will be living with parents for short stay after d/c and has adequate family support for assistance at home with activities.  Patient is safe to ambulate with caregiver and staff support in community distances.       PT Assessment  Patent does not need any further PT services    Follow Up Recommendations  No PT follow up;Supervision - Intermittent          Equipment Recommendations  None recommended by PT          Precautions / Restrictions Precautions Precautions: Back Precaution Booklet Issued: Yes (comment) Precaution Comments: Issued and reviewed w/ pt Required Braces or Orthoses: Spinal Brace Spinal Brace: Lumbar corset;Applied in sitting position;Other (comment) Spinal Brace Comments:  (Per RN, MD stated it's ok for pt to amb w/o, however recommended use in sitting secondary to having young children) Restrictions Weight Bearing Restrictions: No   Pertinent Vitals/Pain 6/10 at rest in lumbar region; decreased with ambulation      Mobility  Bed Mobility Bed  Mobility: Supine to Sit Rolling Right:  (Increased time) Right Sidelying to Sit: 6: Modified independent (Device/Increase time);HOB flat Supine to Sit: 6: Modified independent (Device/Increase time) (Increased time) Details for Bed Mobility Assistance: VC's for log roll technique, pt able to demonstrate with increased time for tasks. Transfers Sit to Stand: 6: Modified independent (Device/Increase time);From bed;From toilet;Without upper extremity assist Stand to Sit: 6: Modified independent (Device/Increase time);To bed;To toilet;Without upper extremity assist Details for Transfer Assistance: Pt demonstrates good technique, able to state and adhere to back precautions Ambulation/Gait Ambulation/Gait Assistance: 6: Modified independent (Device/Increase time) Ambulation Distance (Feet): 300 Feet Assistive device: None Ambulation/Gait Assistance Details: Decreases pain when walking.  Requires extra time due to short stride length Gait Pattern: Step-through pattern Gait velocity: decreased Stairs: Yes Stairs Assistance: 6: Modified independent (Device/Increase time) Stair Management Technique: One rail Right Number of Stairs: 10        PT Goals(Current goals can be found in the care plan section) Acute Rehab PT Goals Patient Stated Goal: D/c home later this morning PT Goal Formulation: With patient/family Time For Goal Achievement: 05/21/13 Potential to Achieve Goals: Good  Visit Information  Last PT Received On: 05/07/13 Assistance Needed: +1 History of Present Illness: Patient is a 37 y.o. female admitted for PLIF L5-S1. Onset of symptoms was several months ago, gradually worsening since that time.  The pain is rated severe, and is located at the across the lower back and radiates to legs. The pain is described as aching and occurs all day. The symptoms have been progressive. Symptoms are exacerbated by exercise. MRI or CT showed DDD  with HNP L5-S1.        Prior Functioning   Home Living Family/patient expects to be discharged to:: Private residence Living Arrangements: Other (Comment) (Plans to d/c home to parents house) Type of Home: House Home Access: Stairs to enter [employer] of Steps: 3-4 Home Layout: One level Home Equipment: None Additional Comments: Pt is planning to purchase a/e prior to d/c home Prior Function Level of Independence: Independent Communication Communication: No difficulties Dominant Hand: Right    Cognition  Cognition Arousal/Alertness: Awake/alert Behavior During Therapy: WFL for tasks assessed/performed Overall Cognitive Status: Within Functional Limits for tasks assessed    Extremity/Trunk Assessment Upper Extremity Assessment Upper Extremity Assessment: Defer to OT evaluation Lower Extremity Assessment Lower Extremity Assessment: Overall WFL for tasks assessed   Balance Balance Balance Assessed: Yes Static Sitting Balance Static Sitting - Balance Support: No upper extremity supported;Feet supported Static Standing Balance Static Standing - Balance Support: No upper extremity supported  End of Session PT - End of Session Equipment Utilized During Treatment: Gait belt;Back brace Activity Tolerance: Patient tolerated treatment well Patient left: with call bell/phone within reach;with family/visitor present;in bed Nurse Communication: Mobility status  GP    Barrie Dunker, SPT Pager:  [phone]  Barrie Dunker 05/07/2013, 11:16 AM

## 2013-05-07 NOTE — Evaluation (Signed)
Occupational Therapy Evaluation Patient Details Name: [name] MRN: [id number] DOB: 01-03-1976 Today's Date: 05/07/2013 Time: 4098-1191 OT Time Calculation (min): 18 min  OT Assessment / Plan / Recommendation History of present illness Patient is a 37 y.o. female admitted for PLIF L5-S1. Onset of symptoms was several months ago, gradually worsening since that time.  The pain is rated severe, and is located at the across the lower back and radiates to legs. The pain is described as aching and occurs all day. The symptoms have been progressive. Symptoms are exacerbated by exercise. MRI or CT showed DDD with HNP L5-S1.    Clinical Impression   Pt is overall Mod I transfers given increased time. She presents w/ deficits in her ability to perform LB ADL's but again was Mod I w/ a/e. She plans to d/c home w/ family assist and reports that she plans to purchase a/e prior to d/c home. She was educated in back precautions and lumbar corset use (don/doff in sitting). She reports no further acute OT needs at this time, will sign off.    OT Assessment  Patient does not need any further OT services    Follow Up Recommendations  No OT follow up    Barriers to Discharge      Equipment Recommendations  None recommended by OT    Recommendations for Other Services    Frequency       Precautions / Restrictions Precautions Precautions: Back Precaution Booklet Issued: Yes (comment) Precaution Comments: Issued and reviewed w/ pt Required Braces or Orthoses: Spinal Brace Spinal Brace: Lumbar corset;Applied in sitting position;Other (comment) Spinal Brace Comments:  (Per RN, MD stated it's ok for pt to amb w/o, however recommended use in sitting secondary to having young children) Restrictions Weight Bearing Restrictions: No   Pertinent Vitals/Pain 6/10 low back pain. She reports that she has had pain medication.    ADL  Eating/Feeding: Performed;Independent Where Assessed - Eating/Feeding:  Bed level Grooming: Simulated;Modified independent Where Assessed - Grooming: Unsupported standing Upper Body Bathing: Set up;Supervision/safety;Simulated Where Assessed - Upper Body Bathing: Unsupported sitting Lower Body Bathing: Simulated;Min guard (Mod I w/ a/e anticipated) Where Assessed - Lower Body Bathing: Supported sit to stand Upper Body Dressing: Performed;Supervision/safety;Set up Where Assessed - Upper Body Dressing: Unsupported sitting Lower Body Dressing: Performed;Modified independent (W/ LH reacher, a/e use) Where Assessed - Lower Body Dressing: Unsupported sitting;Supported sit to stand Toilet Transfer: Performed;Modified independent Toilet Transfer Method: Sit to Barista: Regular height toilet Toileting - Clothing Manipulation and Hygiene: Performed;Modified independent Where Assessed - Toileting Clothing Manipulation and Hygiene: Standing Tub/Shower Transfer Method: Not assessed Equipment Used: Back brace Transfers/Ambulation Related to ADLs: Pt overall Mod I transfers and functional mobility related to ADL's ADL Comments: Pt was educated in role of OT. Handout was provided and reviewed for back precautions, don/doffing back brace in sitting. Pt overall Mod I toilet transfers and LB dressing techniques w/ a/e seated vs lying in bed on back. Pt was educated in a/e use & states that she plans to purchase prior to d/c home later today. She reports no further needs at this time.    OT Diagnosis:    OT Problem List:   OT Treatment Interventions:     OT Goals(Current goals can be found in the care plan section) Acute Rehab OT Goals Patient Stated Goal: D/c home later this morning  Visit Information  Last OT Received On: 05/07/13 History of Present Illness: Patient is a 37 y.o. female admitted  for PLIF L5-S1. Onset of symptoms was several months ago, gradually worsening since that time.  The pain is rated severe, and is located at the across the  lower back and radiates to legs. The pain is described as aching and occurs all day. The symptoms have been progressive. Symptoms are exacerbated by exercise. MRI or CT showed DDD with HNP L5-S1.        Prior Functioning     Home Living Family/patient expects to be discharged to:: Private residence Living Arrangements: Other (Comment) (Plans to d/c home to parents house) Type of Home: House Home Access: Stairs to enter [employer] of Steps: 3-4 Home Layout: One level Home Equipment: None Additional Comments: Pt is planning to purchase a/e prior to d/c home Prior Function Level of Independence: Independent Communication Communication: No difficulties Dominant Hand: Right    Vision/Perception Vision - Assessment Vision Assessment: Vision not tested   Cognition  Cognition Arousal/Alertness: Awake/alert Behavior During Therapy: WFL for tasks assessed/performed Overall Cognitive Status: Within Functional Limits for tasks assessed    Extremity/Trunk Assessment Upper Extremity Assessment Upper Extremity Assessment: Overall WFL for tasks assessed Lower Extremity Assessment Lower Extremity Assessment: Defer to PT evaluation    Mobility Bed Mobility Bed Mobility: Rolling Right;Right Sidelying to Sit Rolling Right: 6: Modified independent (Device/Increase time) Right Sidelying to Sit: 6: Modified independent (Device/Increase time);HOB flat Details for Bed Mobility Assistance: VC's for log roll technique, pt able to demonstrate with increased time for tasks. Transfers Transfers: Sit to Stand;Stand to Sit Sit to Stand: 6: Modified independent (Device/Increase time);From bed;From toilet;Without upper extremity assist Stand to Sit: 6: Modified independent (Device/Increase time);To bed;To toilet;Without upper extremity assist Details for Transfer Assistance: Pt demonstrates good technique, able to state and adhere to back precautions        Balance Balance Balance  Assessed: Yes Static Sitting Balance Static Sitting - Balance Support: No upper extremity supported;Feet supported Static Standing Balance Static Standing - Balance Support: No upper extremity supported   End of Session OT - End of Session Equipment Utilized During Treatment: Back brace Activity Tolerance: Patient tolerated treatment well Patient left: in bed;with call bell/phone within reach;Other (comment) Nurse Communication: Other (comment) (No further acute OT needs)  GO     Alm Bustard 05/07/2013, 9:38 AM

## 2013-05-07 NOTE — Progress Notes (Signed)
Pt. Alert and oriented,follows simple instructions, denies pain. Incision area without swelling, redness or S/S of infection. Voiding adequate clear yellow urine. Moving all extremities well and vitals stable and documented. Patient discharged home with father. Lumbar fusion notes instructions given to patient and family member for home safety and precautions. Pt. and family stated understanding of instructions given. Pain medication given to patient for pain and discomfort of ride home.

## 2013-05-07 NOTE — Evaluation (Signed)
Read, reviewed, and agree.  Gaynor Ferreras B. Dotti Busey, PT, DPT #[id number]  

## 2013-07-07 ENCOUNTER — Telehealth: Payer: Self-pay | Admitting: *Deleted

## 2013-07-07 NOTE — Telephone Encounter (Signed)
Pt called wanting to know if she needed to buy bigger shoes before she picked up her orthotics. Told pt to bring in current shoes that she is going to wear them in and if they dont fit then she can purchase bigger shoes.

## 2013-07-16 ENCOUNTER — Encounter: Payer: Self-pay | Admitting: Podiatry

## 2013-07-16 ENCOUNTER — Ambulatory Visit (INDEPENDENT_AMBULATORY_CARE_PROVIDER_SITE_OTHER): Payer: PRIVATE HEALTH INSURANCE | Admitting: Podiatry

## 2013-07-16 VITALS — BP 117/77 | HR 76 | Resp 16 | Ht 67.0 in | Wt 165.0 lb

## 2013-07-16 DIAGNOSIS — M7750 Other enthesopathy of unspecified foot: Secondary | ICD-10-CM

## 2013-07-16 DIAGNOSIS — L600 Ingrowing nail: Secondary | ICD-10-CM

## 2013-07-16 NOTE — Progress Notes (Signed)
Subjective:     Patient ID: [name], female   DOB: 1975/10/05, 38 y.o.   MRN: [id number]  HPI patient presents for orthotic pickup and also has concern about her ingrown toenail correction which we did on both feet   Review of Systems     Objective:   Physical Exam Neurovascular status intact and patient is well oriented x3 with well-healed female sites hallux bilateral second right with mild thickness of the tissue surrounding the nailbed. Foot pain of a chronic nature both feet that is still present    Assessment:     Tendinitis with mild ingrown toenail deformity which I think overall is doing fine that needs to be worked on periodically    Plan:     Advised on trimming nail corners properly and fitted orthotics with instructions on usage. Reappoint 6 weeks

## 2013-07-16 NOTE — Patient Instructions (Signed)

## 2013-08-27 ENCOUNTER — Ambulatory Visit: Payer: PRIVATE HEALTH INSURANCE | Admitting: Podiatry

## 2013-09-17 ENCOUNTER — Ambulatory Visit: Payer: PRIVATE HEALTH INSURANCE | Admitting: Podiatry

## 2013-10-18 ENCOUNTER — Ambulatory Visit: Payer: Self-pay | Admitting: General Practice

## 2013-10-23 ENCOUNTER — Ambulatory Visit: Payer: Self-pay | Admitting: Physician Assistant

## 2013-10-23 LAB — BASIC METABOLIC PANEL
Anion Gap: 12 (ref 7–16)
BUN: 8 mg/dL (ref 7–18)
CHLORIDE: 105 mmol/L (ref 98–107)
CREATININE: 0.8 mg/dL (ref 0.60–1.30)
Calcium, Total: 8.7 mg/dL (ref 8.5–10.1)
Co2: 25 mmol/L (ref 21–32)
EGFR (African American): >60
EGFR (Non-African Amer.): >60
GLUCOSE: 87 mg/dL (ref 65–99)
Osmolality: 281 (ref 275–301)
Potassium: 4.2 mmol/L (ref 3.5–5.1)
Sodium: 142 mmol/L (ref 136–145)

## 2013-10-23 LAB — URINALYSIS, COMPLETE
Glucose,UR: NEGATIVE mg/dL (ref 0–75)
KETONE: NEGATIVE
Nitrite: POSITIVE
Ph: 6 (ref 4.5–8.0)
RBC,UR: >30 /HPF (ref 0–5)
SPECIFIC GRAVITY: 1.02 (ref 1.003–1.030)

## 2013-10-23 LAB — CBC WITH DIFFERENTIAL/PLATELET
BASOS PCT: 0.3 %
Basophil #: 0.1 10*3/uL (ref 0.0–0.1)
EOS PCT: 0.3 %
Eosinophil #: 0.1 10*3/uL (ref 0.0–0.7)
HCT: 40.6 % (ref 35.0–47.0)
HGB: 13.4 g/dL (ref 12.0–16.0)
LYMPHS ABS: 1.4 10*3/uL (ref 1.0–3.6)
Lymphocyte %: 8.3 %
MCH: 30 pg (ref 26.0–34.0)
MCHC: 32.9 g/dL (ref 32.0–36.0)
MCV: 91 fL (ref 80–100)
Monocyte #: 0.7 x10 3/mm (ref 0.2–0.9)
Monocyte %: 4.2 %
Neutrophil #: 14.3 10*3/uL — ABNORMAL HIGH (ref 1.4–6.5)
Neutrophil %: 86.9 %
Platelet: 264 10*3/uL (ref 150–440)
RBC: 4.45 10*6/uL (ref 3.80–5.20)
RDW: 12.8 % (ref 11.5–14.5)
WBC: 16.5 10*3/uL — AB (ref 3.6–11.0)

## 2013-10-23 LAB — PREGNANCY, URINE: Pregnancy Test, Urine: NEGATIVE m[IU]/mL

## 2013-10-25 LAB — URINE CULTURE

## 2014-05-14 ENCOUNTER — Emergency Department: Payer: Self-pay | Admitting: Emergency Medicine

## 2014-05-14 LAB — BASIC METABOLIC PANEL
Anion Gap: 9 (ref 7–16)
BUN: 6 mg/dL — AB (ref 7–18)
CALCIUM: 8.4 mg/dL — AB (ref 8.5–10.1)
CREATININE: 1.1 mg/dL (ref 0.60–1.30)
Chloride: 108 mmol/L — ABNORMAL HIGH (ref 98–107)
Co2: 22 mmol/L (ref 21–32)
GFR CALC NON AF AMER: 60 — AB
GLUCOSE: 129 mg/dL — AB (ref 65–99)
OSMOLALITY: 277 (ref 275–301)
Potassium: 3.5 mmol/L (ref 3.5–5.1)
SODIUM: 139 mmol/L (ref 136–145)

## 2014-05-14 LAB — CBC
HCT: 42.8 % (ref 35.0–47.0)
HGB: 14.3 g/dL (ref 12.0–16.0)
MCH: 30.5 pg (ref 26.0–34.0)
MCHC: 33.5 g/dL (ref 32.0–36.0)
MCV: 91 fL (ref 80–100)
PLATELETS: 319 10*3/uL (ref 150–440)
RBC: 4.7 10*6/uL (ref 3.80–5.20)
RDW: 12.7 % (ref 11.5–14.5)
WBC: 8 10*3/uL (ref 3.6–11.0)

## 2014-05-14 LAB — TROPONIN I: Troponin-I: <0.02 ng/mL

## 2014-09-05 ENCOUNTER — Ambulatory Visit
Admit: 2014-09-05 | Disposition: A | Discharge status: Home / Self Care | Payer: Self-pay | Attending: Family Medicine | Admitting: Family Medicine

## 2014-12-22 ENCOUNTER — Encounter: Payer: Self-pay | Admitting: Family Medicine

## 2014-12-22 ENCOUNTER — Ambulatory Visit (INDEPENDENT_AMBULATORY_CARE_PROVIDER_SITE_OTHER): Payer: Self-pay | Admitting: Family Medicine

## 2014-12-22 VITALS — BP 110/76 | HR 60 | Temp 98.5°F | Resp 16 | Ht 66.5 in | Wt 183.8 lb

## 2014-12-22 DIAGNOSIS — J029 Acute pharyngitis, unspecified: Secondary | ICD-10-CM

## 2014-12-22 LAB — POCT RAPID STREP A (OFFICE): Rapid Strep A Screen: NEGATIVE

## 2014-12-22 NOTE — Progress Notes (Signed)
Subjective:     Patient ID: [name], female   DOB: May 25, 1976, 38 y.o.   MRN: [id number]  HPI  Chief Complaint  Patient presents with  . Sore Throat    patient comes in office today with complaints of a sore throat for the past 24hrs, pain is located mostly on right than left. Patient describes pain as a dull ache/sharp when swallowing. Associates symptoms include neck pain, ear pain, fatigue and chills. Patient denies being exposed to strep and has taken OTC: Night Quil  States kids are not ill at this time. Has not checked her temp.at home.   Review of Systems     Objective:   Physical Exam  Constitutional: She appears well-developed and well-nourished. She appears distressed (throat pain).  Ears: T.M's intact without inflammation Throat: no tonsillar enlargement or exudate; moderate posterior pharyngeal erythema Neck: no cervical adenopathy Lungs: clear     Assessment:    1. Pharyngitis - POCT rapid strep A    Plan:   Symptomatic treatment discussed.

## 2014-12-22 NOTE — Patient Instructions (Addendum)
Discussed use of ibuprofen and salt water gargles for sore throat. For cold sx Discussed use of Mucinex D for congestion, Delsym for cough, and Benadryl for postnasal drainage.

## 2014-12-30 ENCOUNTER — Encounter: Payer: Self-pay | Admitting: Family Medicine

## 2014-12-30 ENCOUNTER — Ambulatory Visit (INDEPENDENT_AMBULATORY_CARE_PROVIDER_SITE_OTHER): Payer: Self-pay | Admitting: Family Medicine

## 2014-12-30 VITALS — BP 104/72 | HR 72 | Temp 99.2°F | Resp 18 | Wt 184.0 lb

## 2014-12-30 DIAGNOSIS — G4700 Insomnia, unspecified: Secondary | ICD-10-CM | POA: Insufficient documentation

## 2014-12-30 DIAGNOSIS — H109 Unspecified conjunctivitis: Secondary | ICD-10-CM

## 2014-12-30 DIAGNOSIS — J0110 Acute frontal sinusitis, unspecified: Secondary | ICD-10-CM

## 2014-12-30 MED ORDER — HYDROCODONE-HOMATROPINE 5-1.5 MG/5ML PO SYRP: ORAL_SOLUTION | ORAL | Status: DC

## 2014-12-30 MED ORDER — FLUCONAZOLE 150 MG PO TABS: 150.0000 mg | ORAL_TABLET | Freq: Once | ORAL | Status: DC

## 2014-12-30 MED ORDER — TRAZODONE HCL 50 MG PO TABS: ORAL_TABLET | ORAL | Status: DC

## 2014-12-30 MED ORDER — ERYTHROMYCIN 5 MG/GM OP OINT: 1.0000 "application " | TOPICAL_OINTMENT | Freq: Four times a day (QID) | OPHTHALMIC | Status: DC

## 2014-12-30 MED ORDER — AMOXICILLIN-POT CLAVULANATE 875-125 MG PO TABS: 1.0000 | ORAL_TABLET | Freq: Two times a day (BID) | ORAL | Status: DC

## 2014-12-30 NOTE — Progress Notes (Signed)
Subjective:     Patient ID: [name], female   DOB: [birth year], 39 y.o.   MRN: [id number]  HPI  Chief Complaint  Patient presents with  . Sore Throat    Patient is here to follow up. She was last seen on 12/22/14 and was diagnosed with pharyngitis. Strep test was done which was negative. She has done salt garlgels, took Ibuprofen, Delsym, Mucinex and she is better some but not a 100%. She is still has sore throat, head congestion, malaise.  . Back Pain    Sudden occurence yesterday morning. Located in the upper back. Extremely painful to cough, or even with rest. She does not recall injuring her back or doing anything out of the ordinary to cause these symptoms.  . Medication Refill    Patient is requesting refill on her Trazodone today.  Here in f/u of o.v. Of 7/21 for initial onset of URI. She subsequently has developed further sx. Patient reports increased frontal sinus pressure, purulent post nasal drainage and accompanying cough. Reports left eye redness and matting this AM. Has been taking ibuprofen 800 mg. every 4 hours for her discomfort and an otc decongestant. Has continued to smoke while ill.   Review of Systems  Constitutional: Negative for fever and chills.       Objective:   Physical Exam  Constitutional: She appears well-developed and well-nourished. She has a sickly appearance.  Ears: T.M's intact without inflammation Sinuses: mild frontal sinus tenderness Throat: no tonsillar enlargement or exudate, mild posterior pharyngeal erythema. Neck: no cervical adenopathy Lungs: clear Back: left thoracic paraspinal tenderness appreciated    Assessment:    1. Acute frontal sinusitis, recurrence not specified - amoxicillin-clavulanate (AUGMENTIN) 875-125 MG per tablet; Take 1 tablet by mouth 2 (two) times daily.  Dispense: 20 tablet; Refill: 0 - fluconazole (DIFLUCAN) 150 MG tablet; Take 1 tablet (150 mg total) by mouth once.  Dispense: 1 tablet; Refill: 0 -  HYDROcodone-homatropine (HYCODAN) 5-1.5 MG/5ML syrup; 5 ml 4-6 hours as needed for cough  Dispense: 240 mL; Refill: 0  2. Conjunctivitis of left eye - erythromycin ophthalmic ointment; Place 1 application into the left eye 4 (four) times daily.  Dispense: 3.5 g; Refill: 0  3. Insomnia - traZODone (DESYREL) 50 MG tablet; Take one or two at bedtime as needed for insomnia  Dispense: 60 tablet; Refill: 5    Plan:    Start warm compresses for her left eye. If not improving in 24 hours to start abx ointment. Cough syrup may help with back pain as well as ibuprofen every 6 hours.

## 2014-12-30 NOTE — Patient Instructions (Signed)
Warm compresses to left eye 4 x day. If not improving start eye antibiotic

## 2015-03-15 ENCOUNTER — Emergency Department: Payer: Self-pay

## 2015-03-15 ENCOUNTER — Emergency Department
Admission: EM | Admit: 2015-03-15 | Discharge: 2015-03-15 | Disposition: A | Discharge status: Home / Self Care | Payer: Self-pay | Attending: Emergency Medicine | Admitting: Emergency Medicine

## 2015-03-15 ENCOUNTER — Encounter: Payer: Self-pay | Admitting: Emergency Medicine

## 2015-03-15 DIAGNOSIS — M7918 Myalgia, other site: Secondary | ICD-10-CM

## 2015-03-15 DIAGNOSIS — M546 Pain in thoracic spine: Secondary | ICD-10-CM | POA: Insufficient documentation

## 2015-03-15 DIAGNOSIS — Z792 Long term (current) use of antibiotics: Secondary | ICD-10-CM | POA: Insufficient documentation

## 2015-03-15 DIAGNOSIS — Z720 Tobacco use: Secondary | ICD-10-CM | POA: Insufficient documentation

## 2015-03-15 DIAGNOSIS — Z79899 Other long term (current) drug therapy: Secondary | ICD-10-CM | POA: Insufficient documentation

## 2015-03-15 LAB — COMPREHENSIVE METABOLIC PANEL
ALBUMIN: 4.5 g/dL (ref 3.5–5.0)
ALK PHOS: 85 U/L (ref 38–126)
ALT: 21 U/L (ref 14–54)
ANION GAP: 8 (ref 5–15)
AST: 23 U/L (ref 15–41)
BILIRUBIN TOTAL: 0.5 mg/dL (ref 0.3–1.2)
BUN: 10 mg/dL (ref 6–20)
CALCIUM: 9.2 mg/dL (ref 8.9–10.3)
CO2: 23 mmol/L (ref 22–32)
Chloride: 107 mmol/L (ref 101–111)
Creatinine, Ser: 0.73 mg/dL (ref 0.44–1.00)
GFR calc Af Amer: >60 mL/min (ref >60)
GFR calc non Af Amer: >60 mL/min (ref >60)
GLUCOSE: 88 mg/dL (ref 65–99)
Potassium: 4 mmol/L (ref 3.5–5.1)
Sodium: 138 mmol/L (ref 135–145)
Total Protein: 7.8 g/dL (ref 6.5–8.1)

## 2015-03-15 LAB — CBC WITH DIFFERENTIAL/PLATELET
BASOS ABS: 0.1 10*3/uL (ref 0–0.1)
BASOS PCT: 1 %
EOS ABS: 0.1 10*3/uL (ref 0–0.7)
EOS PCT: 1 %
HCT: 43 % (ref 35.0–47.0)
Hemoglobin: 14.5 g/dL (ref 12.0–16.0)
Lymphocytes Relative: 19 %
Lymphs Abs: 1.9 10*3/uL (ref 1.0–3.6)
MCH: 30.9 pg (ref 26.0–34.0)
MCHC: 33.7 g/dL (ref 32.0–36.0)
MCV: 91.6 fL (ref 80.0–100.0)
Monocytes Absolute: 0.4 10*3/uL (ref 0.2–0.9)
Monocytes Relative: 4 %
NEUTROS PCT: 75 %
Neutro Abs: 7.5 10*3/uL — ABNORMAL HIGH (ref 1.4–6.5)
Platelets: 286 10*3/uL (ref 150–440)
RBC: 4.7 MIL/uL (ref 3.80–5.20)
RDW: 12.6 % (ref 11.5–14.5)
WBC: 10 10*3/uL (ref 3.6–11.0)

## 2015-03-15 LAB — TROPONIN I: Troponin I: <0.03 ng/mL (ref <0.031)

## 2015-03-15 LAB — FIBRIN DERIVATIVES D-DIMER (ARMC ONLY): FIBRIN DERIVATIVES D-DIMER (ARMC): 231 (ref 0–499)

## 2015-03-15 LAB — CK: CK TOTAL: 63 U/L (ref 38–234)

## 2015-03-15 MED ORDER — METHOCARBAMOL 500 MG PO TABS: 1000.0000 mg | ORAL_TABLET | Freq: Three times a day (TID) | ORAL | Status: DC | PRN

## 2015-03-15 MED ORDER — OXYCODONE-ACETAMINOPHEN 5-325 MG PO TABS: 1.0000 | ORAL_TABLET | Freq: Four times a day (QID) | ORAL | Status: DC | PRN

## 2015-03-15 MED ORDER — PROMETHAZINE HCL 12.5 MG PO TABS: 12.5000 mg | ORAL_TABLET | Freq: Four times a day (QID) | ORAL | Status: DC | PRN

## 2015-03-15 MED ORDER — MORPHINE SULFATE (PF) 4 MG/ML IV SOLN
4.0000 mg | Freq: Once | INTRAVENOUS | Status: AC
  Administered 2015-03-15: 4 mg via INTRAVENOUS
  Filled 2015-03-15: qty 1

## 2015-03-15 MED ORDER — ONDANSETRON HCL 4 MG/2ML IJ SOLN
INTRAMUSCULAR | Status: AC
  Administered 2015-03-15: 4 mg via INTRAVENOUS
  Filled 2015-03-15: qty 2

## 2015-03-15 MED ORDER — ONDANSETRON HCL 4 MG/2ML IJ SOLN
4.0000 mg | Freq: Once | INTRAMUSCULAR | Status: AC
  Administered 2015-03-15: 4 mg via INTRAVENOUS
  Filled 2015-03-15: qty 2

## 2015-03-15 MED ORDER — HYDROMORPHONE HCL 1 MG/ML IJ SOLN
INTRAMUSCULAR | Status: AC
  Administered 2015-03-15: 1 mg via INTRAVENOUS
  Filled 2015-03-15: qty 1

## 2015-03-15 MED ORDER — ONDANSETRON HCL 4 MG/2ML IJ SOLN
4.0000 mg | Freq: Once | INTRAMUSCULAR | Status: AC
  Administered 2015-03-15: 4 mg via INTRAVENOUS

## 2015-03-15 MED ORDER — HYDROMORPHONE HCL 1 MG/ML IJ SOLN
0.5000 mg | Freq: Once | INTRAMUSCULAR | Status: AC
  Administered 2015-03-15: 0.5 mg via INTRAVENOUS
  Filled 2015-03-15: qty 1

## 2015-03-15 MED ORDER — HYDROMORPHONE HCL 1 MG/ML IJ SOLN
1.0000 mg | Freq: Once | INTRAMUSCULAR | Status: AC
  Administered 2015-03-15: 1 mg via INTRAVENOUS

## 2015-03-15 NOTE — Discharge Instructions (Signed)

## 2015-03-15 NOTE — ED Notes (Signed)
Pt comes into the ED via POV c/o rib/chest pain when she takes a deep breath or moves.  The pain is located on the right side of her upper back.  Patient denies any dizziness, N/V, or diaphoresis.  Patient present with being very uncomfortable upon assessment.

## 2015-03-15 NOTE — ED Provider Notes (Signed)
[facility] Emergency Department Provider Note ____________________________________________  Time seen: Approximately 1:37 PM  I have reviewed the triage vital signs and the nursing notes.   HISTORY  Chief Complaint Chest Pain   HPI DORRENE BENTLY is a 39 y.o. female who presents to the emergency department for sudden onset of right mid back pain. She states that she laid down on the couch and when she awoke she had excruciating pain in the right mid back. The pain does not radiate. The pain increases with any movement or deep breath. She denies shortness of breath or chest pain. She is a smoker, she has had a partial hysterectomy, she denies history of DVT. She denies palpitations or feeling of an increase in heart rate.   Past Medical History  Diagnosis Date  . IBS (irritable bowel syndrome)   . Hypoglycemia   . History of frequent urinary tract infections     Patient Active Problem List   Diagnosis Date Noted  . Insomnia 12/30/2014  . S/P lumbar spinal fusion 05/06/2013    Past Surgical History  Procedure Laterality Date  . Abdominal hysterectomy      PARTIAL  . Gallbladder surgery    . Toe surgery    . Wisdom tooth extraction    . Diagnostic laparoscopy      test on bladder  . Tubal ligation    . Cholecystectomy    . Maximum access (mas)posterior lumbar interbody fusion (plif) 1 level N/A 05/06/2013    Procedure: LUMBAR FIVE-SACRAL ONE MAXIMUM ACCESS POSTERIOR LUMBAR INTERBODY FUSION;  Surgeon: Tia Alert, MD;  Location: [facility];  Service: Neurosurgery;  Laterality: N/A;    Current Outpatient Rx  Name  Route  Sig  Dispense  Refill  . amoxicillin-clavulanate (AUGMENTIN) 875-125 MG per tablet   Oral   Take 1 tablet by mouth 2 (two) times daily.   20 tablet   0   . cyclobenzaprine (FLEXERIL) 10 MG tablet   Oral   Take 10 mg by mouth.         . erythromycin ophthalmic ointment   Left Eye   Place 1 application into the left  eye 4 (four) times daily.   3.5 g   0   . fluconazole (DIFLUCAN) 150 MG tablet   Oral   Take 1 tablet (150 mg total) by mouth once.   1 tablet   0   . HYDROcodone-homatropine (HYCODAN) 5-1.5 MG/5ML syrup      5 ml 4-6 hours as needed for cough   240 mL   0   . methocarbamol (ROBAXIN) 500 MG tablet   Oral   Take 2 tablets (1,000 mg total) by mouth every 8 (eight) hours as needed for muscle spasms.   30 tablet   0   . oxyCODONE-acetaminophen (ROXICET) 5-325 MG tablet   Oral   Take 1 tablet by mouth every 6 (six) hours as needed.   12 tablet   0   . promethazine (PHENERGAN) 12.5 MG tablet   Oral   Take 1 tablet (12.5 mg total) by mouth every 6 (six) hours as needed for nausea or vomiting.   30 tablet   0   . traZODone (DESYREL) 50 MG tablet      Take one or two at bedtime as needed for insomnia   60 tablet   5     Allergies Codeine; Sulfa antibiotics; and Vicodin  Family History  Problem Relation Age of Onset  .  Hypertension Mother   . COPD Mother   . Epilepsy Mother   . Hypertension Father     Social History Social History  Substance Use Topics  . Smoking status: Current Some Day Smoker -- 0.50 packs/day for 20 years    Types: Cigarettes  . Smokeless tobacco: Never Used  . Alcohol Use: No     Comment: very rarely    Review of Systems Constitutional: No fever/chills Eyes: No visual changes. ENT: No sore throat. Cardiovascular: Denies chest pain. Respiratory: Denies shortness of breath. Gastrointestinal: No abdominal pain.  Mild nausea, no vomiting.  No diarrhea.  No constipation. Genitourinary: Negative for dysuria. Musculoskeletal: Positive for back pain. Skin: Negative for rash. Neurological: Negative for headaches, focal weakness or numbness.  10-point ROS otherwise negative.  ____________________________________________   PHYSICAL EXAM:  BP 125/92; HR 64; Temp 98.9; Resp 22; SpO2 98% on room air  VITAL SIGNS: ED Triage Vitals  Enc  Vitals Group     BP --      Pulse --      Resp --      Temp --      Temp src --      SpO2 --      Weight --      Height --      Head Cir --      Peak Flow --      Pain Score --      Pain Loc --      Pain Edu? --      Excl. in GC? --     Constitutional: Alert and oriented. Uncomfortable appearing and in no acute distress. Eyes: Conjunctivae are normal. PERRL. EOMI. Head: Atraumatic. Nose: No congestion/rhinnorhea. Mouth/Throat: Mucous membranes are moist.  Oropharynx non-erythematous. Neck: No stridor.   Cardiovascular: Normal rate, regular rhythm. Grossly normal heart sounds.  Good peripheral circulation. Respiratory: Normal respiratory effort.  No retractions. Lungs CTAB. Gastrointestinal: Soft and nontender. No distention. No abdominal bruits. No CVA tenderness. Musculoskeletal: Pain in the right mid back is not reproducible with palpation, pain is reproduced with movement or deep breaths. Neurologic:  Normal speech and language. No gross focal neurologic deficits are appreciated. No gait instability. Skin:  Skin is warm, dry and intact. No rash noted. Psychiatric: Mood and affect are normal. Speech and behavior are normal.  ____________________________________________   LABS (all labs ordered are listed, but only abnormal results are displayed)  Labs Reviewed  CBC WITH DIFFERENTIAL/PLATELET - Abnormal; Notable for the following:    Neutro Abs 7.5 (*)    All other components within normal limits  COMPREHENSIVE METABOLIC PANEL  FIBRIN DERIVATIVES D-DIMER ([facility] ONLY)  TROPONIN I  CK   ____________________________________________  EKG  Normal sinus rhythm rate of 74, No STEMI, Normal axis; normal QT ____________________________________________  RADIOLOGY  Chest x-ray showing subsegmental atelectasis in the lingula.  CT chest shows a linear subsegmental atelectasis or scarring in both lower lungs. No acute  abnormality. ____________________________________________   PROCEDURES  Procedure(s) performed: None  Critical Care performed: No  ____________________________________________   INITIAL IMPRESSION / ASSESSMENT AND PLAN / ED COURSE  Pertinent labs & imaging results that were available during my care of the patient were reviewed by me and considered in my medical decision making (see chart for details).  Patient has a low risk for PE, however if d-dimer is elevated well proceed with CTA. Morphine and Zofran were ordered.  3:08 PM No relief with morphine and Zofran. Dilaudid and additional Zofran were  ordered. ----------------------------------------- 4:31 PM on 03/15/2015 -----------------------------------------  Pain much less after her Dilaudid and Zofran. D-dimer is negative, however the chest x-ray shows a subsegmental atelectasis in the lingula. Patient does not have pain in the left side, however due to sudden onset of symptoms we will do CT of the chest without contrast.  5:05 PM  Patient was given results of CT scan as well as negative results for labs. She was advised to call to [facility] schedule follow-up appointment this week with her primary care provider. She was advised to return to the emergency department for symptoms that change or worsen if she is unable schedule an appointment.  ____________________________________________   FINAL CLINICAL IMPRESSION(S) / ED DIAGNOSES  Final diagnoses:  Musculoskeletal pain        Chinita Pester, FNP 03/15/15 1800  Sharman Cheek, MD 03/16/15 2126

## 2015-06-08 ENCOUNTER — Ambulatory Visit: Payer: Self-pay | Admitting: Family Medicine

## 2015-08-31 ENCOUNTER — Encounter: Payer: Self-pay | Admitting: Physician Assistant

## 2015-08-31 ENCOUNTER — Ambulatory Visit
Admission: RE | Admit: 2015-08-31 | Discharge: 2015-08-31 | Disposition: A | Discharge status: Home / Self Care | Payer: Managed Care, Other (non HMO) | Source: Ambulatory Visit | Attending: Physician Assistant | Admitting: Physician Assistant

## 2015-08-31 ENCOUNTER — Telehealth: Payer: Self-pay

## 2015-08-31 ENCOUNTER — Ambulatory Visit (INDEPENDENT_AMBULATORY_CARE_PROVIDER_SITE_OTHER): Payer: Managed Care, Other (non HMO) | Admitting: Physician Assistant

## 2015-08-31 VITALS — BP 120/80 | HR 60 | Temp 98.4°F | Resp 16 | Wt 185.2 lb

## 2015-08-31 DIAGNOSIS — R110 Nausea: Secondary | ICD-10-CM | POA: Diagnosis not present

## 2015-08-31 DIAGNOSIS — R42 Dizziness and giddiness: Secondary | ICD-10-CM | POA: Diagnosis not present

## 2015-08-31 DIAGNOSIS — N83209 Unspecified ovarian cyst, unspecified side: Secondary | ICD-10-CM | POA: Insufficient documentation

## 2015-08-31 DIAGNOSIS — X58XXXA Exposure to other specified factors, initial encounter: Secondary | ICD-10-CM | POA: Insufficient documentation

## 2015-08-31 DIAGNOSIS — F430 Acute stress reaction: Secondary | ICD-10-CM | POA: Insufficient documentation

## 2015-08-31 DIAGNOSIS — H9311 Tinnitus, right ear: Secondary | ICD-10-CM | POA: Insufficient documentation

## 2015-08-31 DIAGNOSIS — S0990XA Unspecified injury of head, initial encounter: Secondary | ICD-10-CM | POA: Insufficient documentation

## 2015-08-31 DIAGNOSIS — R51 Headache: Secondary | ICD-10-CM

## 2015-08-31 DIAGNOSIS — M7989 Other specified soft tissue disorders: Secondary | ICD-10-CM | POA: Insufficient documentation

## 2015-08-31 DIAGNOSIS — R519 Headache, unspecified: Secondary | ICD-10-CM

## 2015-08-31 MED ORDER — MECLIZINE HCL 25 MG PO TABS: 25.0000 mg | ORAL_TABLET | Freq: Three times a day (TID) | ORAL | Status: DC | PRN

## 2015-08-31 MED ORDER — PROMETHAZINE HCL 25 MG PO TABS: 25.0000 mg | ORAL_TABLET | Freq: Three times a day (TID) | ORAL | Status: DC | PRN

## 2015-08-31 MED ORDER — TRAMADOL HCL 50 MG PO TABS: 50.0000 mg | ORAL_TABLET | Freq: Four times a day (QID) | ORAL | Status: DC | PRN

## 2015-08-31 NOTE — Patient Instructions (Signed)
Concussion, Adult  A concussion, or closed-head injury, is a brain injury caused by a direct blow to the head or by a quick and sudden movement (jolt) of the head or neck. Concussions are usually not life-threatening. Even so, the effects of a concussion can be serious. If you have had a concussion before, you are more likely to experience concussion-like symptoms after a direct blow to the head.   CAUSES  · Direct blow to the head, such as from running into another player during a soccer game, being hit in a fight, or hitting your head on a hard surface.  · A jolt of the head or neck that causes the brain to move back and forth inside the skull, such as in a car crash.  SIGNS AND SYMPTOMS  The signs of a concussion can be hard to notice. Early on, they may be missed by you, family members, and health care providers. You may look fine but act or feel differently.  Symptoms are usually temporary, but they may last for days, weeks, or even longer. Some symptoms may appear right away while others may not show up for hours or days. Every head injury is different. Symptoms include:  · Mild to moderate headaches that will not go away.  · A feeling of pressure inside your head.  · Having more trouble than usual:    Learning or remembering things you have heard.    Answering questions.    Paying attention or concentrating.    Organizing daily tasks.    Making decisions and solving problems.  · Slowness in thinking, acting or reacting, speaking, or reading.  · Getting lost or being easily confused.  · Feeling tired all the time or lacking energy (fatigued).  · Feeling drowsy.  · Sleep disturbances.    Sleeping more than usual.    Sleeping less than usual.    Trouble falling asleep.    Trouble sleeping (insomnia).  · Loss of balance or feeling lightheaded or dizzy.  · Nausea or vomiting.  · Numbness or tingling.  · Increased sensitivity to:    Sounds.    Lights.    Distractions.  · Vision problems or eyes that tire  easily.  · Diminished sense of taste or smell.  · Ringing in the ears.  · Mood changes such as feeling sad or anxious.  · Becoming easily irritated or angry for little or no reason.  · Lack of motivation.  · Seeing or hearing things other people do not see or hear (hallucinations).  DIAGNOSIS  Your health care provider can usually diagnose a concussion based on a description of your injury and symptoms. He or she will ask whether you passed out (lost consciousness) and whether you are having trouble remembering events that happened right before and during your injury.  Your evaluation might include:  · A brain scan to look for signs of injury to the brain. Even if the test shows no injury, you may still have a concussion.  · Blood tests to be sure other problems are not present.  TREATMENT  · Concussions are usually treated in an emergency department, in urgent care, or at a clinic. You may need to stay in the hospital overnight for further treatment.  · Tell your health care provider if you are taking any medicines, including prescription medicines, over-the-counter medicines, and natural remedies. Some medicines, such as blood thinners (anticoagulants) and aspirin, may increase the chance of complications. Also tell your health care   provider whether you have had alcohol or are taking illegal drugs. This information may affect treatment.  · Your health care provider will send you home with important instructions to follow.  · How fast you will recover from a concussion depends on many factors. These factors include how severe your concussion is, what part of your brain was injured, your age, and how healthy you were before the concussion.  · Most people with mild injuries recover fully. Recovery can take time. In general, recovery is slower in older persons. Also, persons who have had a concussion in the past or have other medical problems may find that it takes longer to recover from their current injury.  HOME  CARE INSTRUCTIONS  General Instructions  · Carefully follow the directions your health care provider gave you.  · Only take over-the-counter or prescription medicines for pain, discomfort, or fever as directed by your health care provider.  · Take only those medicines that your health care provider has approved.  · Do not drink alcohol until your health care provider says you are well enough to do so. Alcohol and certain other drugs may slow your recovery and can put you at risk of further injury.  · If it is harder than usual to remember things, write them down.  · If you are easily distracted, try to do one thing at a time. For example, do not try to watch TV while fixing dinner.  · Talk with family members or close friends when making important decisions.  · Keep all follow-up appointments. Repeated evaluation of your symptoms is recommended for your recovery.  · Watch your symptoms and tell others to do the same. Complications sometimes occur after a concussion. Older adults with a brain injury may have a higher risk of serious complications, such as a blood clot on the brain.  · Tell your teachers, school nurse, school counselor, coach, athletic trainer, or work manager about your injury, symptoms, and restrictions. Tell them about what you can or cannot do. They should watch for:    Increased problems with attention or concentration.    Increased difficulty remembering or learning new information.    Increased time needed to complete tasks or assignments.    Increased irritability or decreased ability to cope with stress.    Increased symptoms.  · Rest. Rest helps the brain to heal. Make sure you:    Get plenty of sleep at night. Avoid staying up late at night.    Keep the same bedtime hours on weekends and weekdays.    Rest during the day. Take daytime naps or rest breaks when you feel tired.  · Limit activities that require a lot of thought or concentration. These include:    Doing homework or job-related  work.    Watching TV.    Working on the computer.  · Avoid any situation where there is potential for another head injury (football, hockey, soccer, basketball, martial arts, downhill snow sports and horseback riding). Your condition will get worse every time you experience a concussion. You should avoid these activities until you are evaluated by the appropriate follow-up health care providers.  Returning To Your Regular Activities  You will need to return to your normal activities slowly, not all at once. You must give your body and brain enough time for recovery.  · Do not return to sports or other athletic activities until your health care provider tells you it is safe to do so.  · Ask   your health care provider when you can drive, ride a bicycle, or operate heavy machinery. Your ability to react may be slower after a brain injury. Never do these activities if you are dizzy.  · Ask your health care provider about when you can return to work or school.  Preventing Another Concussion  It is very important to avoid another brain injury, especially before you have recovered. In rare cases, another injury can lead to permanent brain damage, brain swelling, or death. The risk of this is greatest during the first 7-10 days after a head injury. Avoid injuries by:  · Wearing a seat belt when riding in a car.  · Drinking alcohol only in moderation.  · Wearing a helmet when biking, skiing, skateboarding, skating, or doing similar activities.  · Avoiding activities that could lead to a second concussion, such as contact or recreational sports, until your health care provider says it is okay.  · Taking safety measures in your home.    Remove clutter and tripping hazards from floors and stairways.    Use grab bars in bathrooms and handrails by stairs.    Place non-slip mats on floors and in bathtubs.    Improve lighting in dim areas.  SEEK MEDICAL CARE IF:  · You have increased problems paying attention or  concentrating.  · You have increased difficulty remembering or learning new information.  · You need more time to complete tasks or assignments than before.  · You have increased irritability or decreased ability to cope with stress.  · You have more symptoms than before.  Seek medical care if you have any of the following symptoms for more than 2 weeks after your injury:  · Lasting (chronic) headaches.  · Dizziness or balance problems.  · Nausea.  · Vision problems.  · Increased sensitivity to noise or light.  · Depression or mood swings.  · Anxiety or irritability.  · Memory problems.  · Difficulty concentrating or paying attention.  · Sleep problems.  · Feeling tired all the time.  SEEK IMMEDIATE MEDICAL CARE IF:  · You have severe or worsening headaches. These may be a sign of a blood clot in the brain.  · You have weakness (even if only in one hand, leg, or part of the face).  · You have numbness.  · You have decreased coordination.  · You vomit repeatedly.  · You have increased sleepiness.  · One pupil is larger than the other.  · You have convulsions.  · You have slurred speech.  · You have increased confusion. This may be a sign of a blood clot in the brain.  · You have increased restlessness, agitation, or irritability.  · You are unable to recognize people or places.  · You have neck pain.  · It is difficult to wake you up.  · You have unusual behavior changes.  · You lose consciousness.  MAKE SURE YOU:  · Understand these instructions.  · Will watch your condition.  · Will get help right away if you are not doing well or get worse.     This information is not intended to replace advice given to you by your health care provider. Make sure you discuss any questions you have with your health care provider.     Document Released: 08/10/2003 Document Revised: 06/10/2014 Document Reviewed: 12/10/2012  Elsevier Interactive Patient Education ©2016 [employer]

## 2015-08-31 NOTE — Progress Notes (Signed)
Patient: [name] Female    DOB: 09/07/1975   40 y.o.   MRN: [id number] Visit Date: 08/31/2015  Today's Provider: Margaretann Loveless, PA-C   Chief Complaint  Patient presents with  . Head Injury   Subjective:    HPI  Patient hit her head 6 days (Saturday 08/26/15) ago on the right temple. She states she was working in her yard and was digging holes to put up a fence. There was a trellis on the ground that she accidentally hit when digging and it flung up and hit her over the right temple. She reports she has had a headache since then, and it is getting worse . It is on the right side only and that it feels like pressure. She has been feeling dizzy and feels waves (roaring) sounds in her right ear. She also has some visual disturbances, like dark shadows. She developed vomiting and diarrhea yesterday. She vomitted this morning but no diarrhea. She feels foggy, not like herself and lots of nausea. On the right side of her head she feels pain when she touches it where she was hit.  She denies LOC but states it did make her fall down and "see stars".  Treatment: Aleve  took one yesterday and one today to help with the headache. The Medication belongs to her daughter.    Allergies  Allergen Reactions  . Codeine Nausea And Vomiting  . Prednisolone     Other reaction(s): Dizziness, Irregular heart rate  . Sulfa Antibiotics Rash  . Vicodin [Hydrocodone-Acetaminophen] Nausea Only    And itching all over   Previous Medications   METHOCARBAMOL (ROBAXIN) 500 MG TABLET    Take 2 tablets (1,000 mg total) by mouth every 8 (eight) hours as needed for muscle spasms.   PROMETHAZINE (PHENERGAN) 12.5 MG TABLET    Take 1 tablet (12.5 mg total) by mouth every 6 (six) hours as needed for nausea or vomiting.   TRAZODONE (DESYREL) 50 MG TABLET    Take one or two at bedtime as needed for insomnia    Review of Systems  Constitutional: Negative for activity change and appetite change.    Eyes: Positive for visual disturbance.  Respiratory: Positive for chest tightness (but is probably the anxiety from the headache.). Negative for cough, shortness of breath and wheezing.   Cardiovascular: Negative for chest pain, palpitations and leg swelling.  Gastrointestinal: Positive for nausea, vomiting and diarrhea. Negative for abdominal pain.  Neurological: Positive for dizziness, weakness, light-headedness and headaches. Negative for speech difficulty and numbness.  Psychiatric/Behavioral: Negative for confusion.    Social History  Substance Use Topics  . Smoking status: Current Some Day Smoker -- 0.50 packs/day for 20 years    Types: Cigarettes  . Smokeless tobacco: Never Used  . Alcohol Use: No     Comment: very rarely   Objective:   BP 120/80 mmHg  Pulse 60  Temp(Src) 98.4 F (36.9 C) (Oral)  Resp 16  Wt 185 lb 3.2 oz (84.006 kg)  Physical Exam  Constitutional: She is oriented to person, place, and time. She appears well-developed and well-nourished. No distress.  HENT:  Head: Normocephalic and atraumatic. Head is without raccoon's eyes, without Battle's sign, without contusion and without laceration.    Right Ear: Hearing, tympanic membrane, external ear and ear canal normal.  Left Ear: Hearing, tympanic membrane, external ear and ear canal normal.  Nose: Nose normal.  Mouth/Throat: Uvula is midline, oropharynx is clear and  moist and mucous membranes are normal. No oropharyngeal exudate, posterior oropharyngeal edema or posterior oropharyngeal erythema.  Eyes: Conjunctivae are normal. Pupils are equal, round, and reactive to light. Right eye exhibits no discharge. Left eye exhibits no discharge. No scleral icterus.  Neck: Normal range of motion. Neck supple. No tracheal deviation present. No thyromegaly present.  Cardiovascular: Normal rate, regular rhythm and normal heart sounds.  Exam reveals no gallop and no friction rub.   No murmur heard. Pulmonary/Chest:  Effort normal and breath sounds normal. No stridor. No respiratory distress. She has no wheezes. She has no rales.  Musculoskeletal: Normal range of motion.  Lymphadenopathy:    She has no cervical adenopathy.  Neurological: She is alert and oriented to person, place, and time. She has normal strength. No cranial nerve deficit or sensory deficit. Gait abnormal. Coordination normal.  Positive romberg and unsteady gait  Skin: Skin is warm and dry. She is not diaphoretic.  Vitals reviewed.       Assessment & Plan:     1. Nausea We'll get CT of the head to rule out a slow bleed. If CT is normal will give promethazine, meclizine and tramadol for nausea and vomiting and headache. We did discuss that she most likely did obtain a concussion from this injury and discussed postconcussive syndrome. I did put her out of work until next week. I will see her on Monday for reevaluation. If she is still symptomatic I will continue to keep her out of work for brain rest. May consider ENT referral if tinnitus and dizziness do not improve over the weekend. - CT Head Wo Contrast; Future - promethazine (PHENERGAN) 25 MG tablet; Take 1 tablet (25 mg total) by mouth every 8 (eight) hours as needed for nausea or vomiting.  Dispense: 20 tablet; Refill: 0  2. Vertigo See above medical treatment plan. - CT Head Wo Contrast; Future - meclizine (ANTIVERT) 25 MG tablet; Take 1 tablet (25 mg total) by mouth 3 (three) times daily as needed for dizziness.  Dispense: 30 tablet; Refill: 0  3. Head injury, initial encounter See above medical treatment plan. - CT Head Wo Contrast; Future  4. Tinnitus, right See above medical treatment plan. - CT Head Wo Contrast; Future  5. Headache, unspecified headache type See above medical treatment plan. - traMADol (ULTRAM) 50 MG tablet; Take 1 tablet (50 mg total) by mouth every 6 (six) hours as needed.  Dispense: 30 tablet; Refill: 0  *Addend: CLINICAL DATA: Right-sided  headaches following blunt trauma to the head  EXAM: CT HEAD WITHOUT CONTRAST  TECHNIQUE: Contiguous axial images were obtained from the base of the skull through the vertex without intravenous contrast.  COMPARISON: None.  FINDINGS: Bony calvarium is intact. No gross soft tissue abnormality is noted. No findings to suggest acute hemorrhage, acute infarction or space-occupying mass lesion are noted.  IMPRESSION: No acute intracranial abnormality noted.  These results were called by telephone at the time of interpretation on 08/31/2015 at 4:34 pm to [facility]ENNIF[facility] Bryella Diviney, PA , who verbally acknowledged these results.   Electronically Signed  By: Alcide CleverMark Lukens M.D.  On: 08/31/2015 16:38       Margaretann LovelessJennifer M Anisten Tomassi, PA-C  [facility]

## 2015-08-31 NOTE — Telephone Encounter (Signed)
Prescription for Tramadol 50 mg was called to [facility] [address]. Patient advised.  Thanks,  -Janiesha Diehl

## 2015-09-04 ENCOUNTER — Ambulatory Visit (INDEPENDENT_AMBULATORY_CARE_PROVIDER_SITE_OTHER): Payer: Managed Care, Other (non HMO) | Admitting: Physician Assistant

## 2015-09-04 ENCOUNTER — Encounter: Payer: Self-pay | Admitting: Physician Assistant

## 2015-09-04 VITALS — BP 112/78 | HR 72 | Temp 99.1°F | Resp 16 | Wt 185.4 lb

## 2015-09-04 DIAGNOSIS — S060X0D Concussion without loss of consciousness, subsequent encounter: Secondary | ICD-10-CM

## 2015-09-04 DIAGNOSIS — G43109 Migraine with aura, not intractable, without status migrainosus: Secondary | ICD-10-CM

## 2015-09-04 MED ORDER — BUTALBITAL-APAP-CAFFEINE 50-325-40 MG PO TABS: 1.0000 | ORAL_TABLET | Freq: Two times a day (BID) | ORAL | Status: DC | PRN

## 2015-09-04 NOTE — Patient Instructions (Signed)
Post-Concussion Syndrome  Post-concussion syndrome describes the symptoms that can occur after a head injury. These symptoms can last from weeks to months.  CAUSES   It is not clear why some head injuries cause post-concussion syndrome. It can occur whether your head injury was mild or severe and whether you were wearing head protection or not.   SIGNS AND SYMPTOMS  · Memory difficulties.  · Dizziness.  · Headaches.  · Double vision or blurry vision.  · Sensitivity to light.  · Hearing difficulties.  · Depression.  · Tiredness.  · Weakness.  · Difficulty with concentration.  · Difficulty sleeping or staying asleep.  · Vomiting.  · Poor balance or instability on your feet.  · Slow reaction time.  · Difficulty learning and remembering things you have heard.  DIAGNOSIS   There is no test to determine whether you have post-concussion syndrome. Your health care provider may order an imaging scan of your brain, such as a CT scan, to check for other problems that may be causing your symptoms (such as a severe injury inside your skull).  TREATMENT   Usually, these problems disappear over time without medical care. Your health care provider may prescribe medicine to help ease your symptoms. It is important to follow up with a neurologist to evaluate your recovery and address any lingering symptoms or issues.  HOME CARE INSTRUCTIONS   · Take medicines only as directed by your health care provider. Do not take aspirin. Aspirin can slow blood clotting.  · Sleep with your head slightly elevated to help with headaches.  · Avoid any situation where there is potential for another head injury. This includes football, hockey, soccer, basketball, martial arts, downhill snow sports, and horseback riding. Your condition will get worse every time you experience a concussion. You should avoid these activities until you are evaluated by the appropriate follow-up health care providers.  · Keep all follow-up visits as directed by your health  care provider. This is important.  SEEK MEDICAL CARE IF:  · You have increased problems paying attention or concentrating.  · You have increased difficulty remembering or learning new information.  · You need more time to complete tasks or assignments than before.  · You have increased irritability or decreased ability to cope with stress.  · You have more symptoms than before.  Seek medical care if you have any of the following symptoms for more than two weeks after your injury:  · Lasting (chronic) headaches.  · Dizziness or balance problems.  · Nausea.  · Vision problems.  · Increased sensitivity to noise or light.  · Depression or mood swings.  · Anxiety or irritability.  · Memory problems.  · Difficulty concentrating or paying attention.  · Sleep problems.  · Feeling tired all the time.  SEEK IMMEDIATE MEDICAL CARE IF:  · You have confusion or unusual drowsiness.  · Others find it difficult to wake you up.  · You have nausea or persistent, forceful vomiting.  · You feel like you are moving when you are not (vertigo). Your eyes may move rapidly back and forth.  · You have convulsions or faint.  · You have severe, persistent headaches that are not relieved by medicine.  · You cannot use your arms or legs normally.  · One of your pupils is larger than the other.  · You have clear or bloody discharge from your nose or ears.  · Your problems are getting worse, not better.  MAKE   SURE YOU:  · Understand these instructions.  · Will watch your condition.  · Will get help right away if you are not doing well or get worse.     This information is not intended to replace advice given to you by your health care provider. Make sure you discuss any questions you have with your health care provider.     Document Released: 11/09/2001 Document Revised: 06/10/2014 Document Reviewed: 08/25/2013  Elsevier Interactive Patient Education ©2016 [employer]
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
## 2015-09-06 ENCOUNTER — Other Ambulatory Visit: Payer: Self-pay | Admitting: Physician Assistant

## 2015-09-06 ENCOUNTER — Encounter: Payer: Self-pay | Admitting: Physician Assistant

## 2015-09-06 ENCOUNTER — Ambulatory Visit
Admission: RE | Admit: 2015-09-06 | Discharge: 2015-09-06 | Disposition: A | Discharge status: Home / Self Care | Payer: Managed Care, Other (non HMO) | Source: Ambulatory Visit | Attending: Physician Assistant | Admitting: Physician Assistant

## 2015-09-06 ENCOUNTER — Ambulatory Visit (INDEPENDENT_AMBULATORY_CARE_PROVIDER_SITE_OTHER): Payer: Managed Care, Other (non HMO) | Admitting: Physician Assistant

## 2015-09-06 VITALS — BP 132/80 | HR 60 | Temp 98.4°F | Resp 16 | Wt 185.4 lb

## 2015-09-06 DIAGNOSIS — M6248 Contracture of muscle, other site: Secondary | ICD-10-CM | POA: Diagnosis not present

## 2015-09-06 DIAGNOSIS — M5412 Radiculopathy, cervical region: Secondary | ICD-10-CM | POA: Insufficient documentation

## 2015-09-06 DIAGNOSIS — M50322 Other cervical disc degeneration at C5-C6 level: Secondary | ICD-10-CM | POA: Diagnosis not present

## 2015-09-06 DIAGNOSIS — M5030 Other cervical disc degeneration, unspecified cervical region: Secondary | ICD-10-CM

## 2015-09-06 DIAGNOSIS — R519 Headache, unspecified: Secondary | ICD-10-CM

## 2015-09-06 DIAGNOSIS — M62838 Other muscle spasm: Secondary | ICD-10-CM

## 2015-09-06 DIAGNOSIS — R51 Headache: Secondary | ICD-10-CM

## 2015-09-06 DIAGNOSIS — R208 Other disturbances of skin sensation: Secondary | ICD-10-CM

## 2015-09-06 DIAGNOSIS — M50323 Other cervical disc degeneration at C6-C7 level: Secondary | ICD-10-CM | POA: Diagnosis not present

## 2015-09-06 DIAGNOSIS — R200 Anesthesia of skin: Secondary | ICD-10-CM

## 2015-09-06 MED ORDER — METHOCARBAMOL 500 MG PO TABS: 1000.0000 mg | ORAL_TABLET | Freq: Three times a day (TID) | ORAL | Status: DC | PRN

## 2015-09-06 MED ORDER — TRAMADOL HCL 50 MG PO TABS: 50.0000 mg | ORAL_TABLET | Freq: Four times a day (QID) | ORAL | Status: DC | PRN

## 2015-09-06 NOTE — Patient Instructions (Signed)
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
## 2015-09-06 NOTE — Telephone Encounter (Signed)
Appears both RX were set as print. Called both RX in at [facility]. Patient is aware.

## 2015-09-06 NOTE — Telephone Encounter (Signed)
[facility] [address] And was advised that they haven't received either of the scripts that were going to be sent in today. Pt would like methocarbamol (ROBAXIN) 500 MG tablet & traMADol (ULTRAM) 50 MG tablet to be sent in asap. Thanks TNP

## 2015-09-11 ENCOUNTER — Ambulatory Visit: Payer: Managed Care, Other (non HMO) | Admitting: Physician Assistant

## 2015-09-11 ENCOUNTER — Ambulatory Visit (INDEPENDENT_AMBULATORY_CARE_PROVIDER_SITE_OTHER): Payer: Managed Care, Other (non HMO) | Admitting: Physician Assistant

## 2015-09-11 ENCOUNTER — Encounter: Payer: Self-pay | Admitting: Physician Assistant

## 2015-09-11 VITALS — BP 128/70 | HR 88 | Temp 98.5°F | Resp 16 | Wt 185.8 lb

## 2015-09-11 DIAGNOSIS — M6249 Contracture of muscle, multiple sites: Secondary | ICD-10-CM

## 2015-09-11 DIAGNOSIS — M5412 Radiculopathy, cervical region: Secondary | ICD-10-CM

## 2015-09-11 DIAGNOSIS — M542 Cervicalgia: Secondary | ICD-10-CM

## 2015-09-11 DIAGNOSIS — M62838 Other muscle spasm: Secondary | ICD-10-CM

## 2015-09-11 MED ORDER — PREDNISONE 10 MG PO TABS: ORAL_TABLET | ORAL | Status: DC

## 2015-09-11 NOTE — Progress Notes (Signed)
Patient: [name] Female    DOB: 1976/02/21   39 y.o.   MRN: [id number] Visit Date: 09/11/2015  Today's Provider: Margaretann LovelessJennifer M Burnette, PA-C   Chief Complaint  Patient presents with  . Follow-up    Muscle Spasm and Headache   Subjective:    HPI Patient is here to follow-up on muscle spasm of neck. Last office visit she was prescribed Robaxin 500 mg. She was advised to use heating pad and do some gently stretching. Xray was obtained and showed Degenerative disc disease at C5-6 and C6-7 with some foraminal narrowing at these levels.Slight reversal of cervical spine curvature. She reports that she can put her chin down slowly but can feel the pain down her spine. She still have the arm pain specially on her left shoulder with the numbness in her hands bilaterally. She denies any weakness or grip strength decrease.   Headache: Tramadol was refilled last ov. She reports her headache is much better. She have had a couple of episode that made her dizzy but not like it was last week. No nausea or vision disturbances.    Allergies  Allergen Reactions  . Codeine Nausea And Vomiting  . Prednisolone     Other reaction(s): Dizziness, Irregular heart rate  . Sulfa Antibiotics Rash  . Vicodin [Hydrocodone-Acetaminophen] Nausea Only    And itching all over   Previous Medications   BUTALBITAL-ACETAMINOPHEN-CAFFEINE (FIORICET, ESGIC) 50-325-40 MG TABLET    Take 1 tablet by mouth 2 (two) times daily as needed for headache.   MECLIZINE (ANTIVERT) 25 MG TABLET    Take 1 tablet (25 mg total) by mouth 3 (three) times daily as needed for dizziness.   METHOCARBAMOL (ROBAXIN) 500 MG TABLET    Take 2 tablets (1,000 mg total) by mouth every 8 (eight) hours as needed for muscle spasms.   PROMETHAZINE (PHENERGAN) 25 MG TABLET    Take 1 tablet (25 mg total) by mouth every 8 (eight) hours as needed for nausea or vomiting.   TRAMADOL (ULTRAM) 50 MG TABLET    Take 1 tablet (50 mg total) by mouth every  6 (six) hours as needed.   TRAZODONE (DESYREL) 50 MG TABLET    Take one or two at bedtime as needed for insomnia    Review of Systems  Constitutional: Negative.   HENT: Negative.   Respiratory: Negative.   Cardiovascular: Negative for chest pain, palpitations and leg swelling.  Gastrointestinal: Negative.   Musculoskeletal: Positive for neck pain and neck stiffness.  Neurological: Positive for numbness. Negative for weakness.    Social History  Substance Use Topics  . Smoking status: Current Some Day Smoker -- 0.50 packs/day for 20 years    Types: Cigarettes  . Smokeless tobacco: Never Used  . Alcohol Use: No     Comment: very rarely   Objective:   BP 128/70 mmHg  Pulse 88  Temp(Src) 98.5 F (36.9 C) (Oral)  Resp 16  Wt 185 lb 12.8 oz (84.278 kg)  Physical Exam  Constitutional: She appears well-developed and well-nourished. No distress.  Neck: No JVD present. Muscular tenderness present. No spinous process tenderness present. Rigidity present. Decreased range of motion (all motions) present. No tracheal deviation present. No Brudzinski's sign and no Kernig's sign noted. No thyromegaly present.  Cardiovascular: Normal rate, regular rhythm and normal heart sounds.  Exam reveals no gallop and no friction rub.   No murmur heard. Pulmonary/Chest: Effort normal and breath sounds normal. No respiratory distress.  She has no wheezes. She has no rales.  Lymphadenopathy:    She has no cervical adenopathy.  Skin: She is not diaphoretic.  Vitals reviewed.       Assessment & Plan:     1. Cervicalgia Will add prednisone to robaxin therapy. Continue heat and gentle stretching. Will refer to PT as well. I will see her back next week to re-evaluate and see if prednisone has offered any relief. Discussed taking an anti-histamine for flushing response she gets from prednisone. MRI was denied at this time. She is to call if symptoms worsen. - predniSONE (DELTASONE) 10 MG tablet; Take 4  tabs PO on day 1 & 2, 3 tabs PO days 3 & 4, 2 tabs PO day 5 & 6, and 1 tab PO until completed.  Dispense: 30 tablet; Refill: 0 - Ambulatory referral to Physical Therapy  2. Cervical paraspinal muscle spasm See above medical treatment plan. - predniSONE (DELTASONE) 10 MG tablet; Take 4 tabs PO on day 1 & 2, 3 tabs PO days 3 & 4, 2 tabs PO day 5 & 6, and 1 tab PO until completed.  Dispense: 30 tablet; Refill: 0 - Ambulatory referral to Physical Therapy  3. Radiculopathy of cervical spine See above medical treatment plan. - predniSONE (DELTASONE) 10 MG tablet; Take 4 tabs PO on day 1 & 2, 3 tabs PO days 3 & 4, 2 tabs PO day 5 & 6, and 1 tab PO until completed.  Dispense: 30 tablet; Refill: 0 - Ambulatory referral to Physical Therapy       Margaretann Loveless, PA-C  [facility]

## 2015-09-11 NOTE — Patient Instructions (Signed)
Acute Torticollis Torticollis is a condition in which the muscles of the neck tighten (contract) abnormally, causing the neck to twist and the head to move into an unnatural position. Torticollis that develops suddenly is called acute torticollis. If torticollis becomes chronic and is left untreated, the face and neck can become deformed. CAUSES This condition may be caused by:  Sleeping in an awkward position (common).  Extending or twisting the neck muscles beyond their normal position.  Infection. In some cases, the cause may not be known. SYMPTOMS Symptoms of this condition include:  An unnatural position of the head.  Neck pain.  A limited ability to move the neck.  Twisting of the neck to one side. DIAGNOSIS This condition is diagnosed with a physical exam. You may also have imaging tests, such as an X-ray, CT scan, or MRI. TREATMENT Treatment for this condition involves trying to relax the neck muscles. It may include:  Medicines or shots.  Physical therapy.  Surgery. This may be done in severe cases. HOME CARE INSTRUCTIONS  Take medicines only as directed by your health care provider.  Do stretching exercises and massage your neck as directed by your health care provider.  Keep all follow-up visits as directed by your health care provider. This is important. SEEK MEDICAL CARE IF:  You develop a fever. SEEK IMMEDIATE MEDICAL CARE IF:  You develop difficulty breathing.  You develop noisy breathing (stridor).  You start drooling.  You have trouble swallowing or have pain with swallowing.  You develop numbness or weakness in your hands or feet.  You have changes in your speech, understanding, or vision.  Your pain gets worse.   This information is not intended to replace advice given to you by your health care provider. Make sure you discuss any questions you have with your health care provider.   Document Released: 05/17/2000 Document Revised:  10/04/2014 Document Reviewed: 05/16/2014 Elsevier Interactive Patient Education 2016 Elsevier Inc. Muscle Cramps and Spasms Muscle cramps and spasms occur when a muscle or muscles tighten and you have no control over this tightening (involuntary muscle contraction). They are a common problem and can develop in any muscle. The most common place is in the calf muscles of the leg. Both muscle cramps and muscle spasms are involuntary muscle contractions, but they also have differences:   Muscle cramps are sporadic and painful. They may last a few seconds to a quarter of an hour. Muscle cramps are often more forceful and last longer than muscle spasms.  Muscle spasms may or may not be painful. They may also last just a few seconds or much longer. CAUSES  It is uncommon for cramps or spasms to be due to a serious underlying problem. In many cases, the cause of cramps or spasms is unknown. Some common causes are:   Overexertion.   Overuse from repetitive motions (doing the same thing over and over).   Remaining in a certain position for a long period of time.   Improper preparation, form, or technique while performing a sport or activity.   Dehydration.   Injury.   Side effects of some medicines.   Abnormally low levels of the salts and ions in your blood (electrolytes), especially potassium and calcium. This could happen if you are taking water pills (diuretics) or you are pregnant.  Some underlying medical problems can make it more likely to develop cramps or spasms. These include, but are not limited to:   Diabetes.   Parkinson disease.  Hormone disorders, such as thyroid problems.   °· Alcohol abuse.   °· Diseases specific to muscles, joints, and bones.   °· Blood vessel disease where not enough blood is getting to the muscles.   °HOME CARE INSTRUCTIONS  °· Stay well hydrated. Drink enough water and fluids to keep your urine clear or pale yellow. °· It may be helpful to  massage, stretch, and relax the affected muscle. °· For tight or tense muscles, use a warm towel, heating pad, or hot shower water directed to the affected area. °· If you are sore or have pain after a cramp or spasm, applying ice to the affected area may relieve discomfort. °· Put ice in a plastic bag. °· Place a towel between your skin and the bag. °· Leave the ice on for 15-20 minutes, 03-04 times a day. °· Medicines used to treat a known cause of cramps or spasms may help reduce their frequency or severity. Only take over-the-counter or prescription medicines as directed by your caregiver. °SEEK MEDICAL CARE IF:  °Your cramps or spasms get more severe, more frequent, or do not improve over time.  °MAKE SURE YOU:  °· Understand these instructions. °· Will watch your condition. °· Will get help right away if you are not doing well or get worse. °  °This information is not intended to replace advice given to you by your health care provider. Make sure you discuss any questions you have with your health care provider. °  °Document Released: 11/09/2001 Document Revised: 09/14/2012 Document Reviewed: 05/06/2012 °Elsevier Interactive Patient Education ©2016 Elsevier Inc. ° °

## 2015-09-18 ENCOUNTER — Telehealth: Payer: Self-pay | Admitting: Physician Assistant

## 2015-09-18 ENCOUNTER — Ambulatory Visit: Payer: Managed Care, Other (non HMO) | Admitting: Physician Assistant

## 2015-09-18 NOTE — Telephone Encounter (Signed)
FYI---Insurance company denied MRI

## 2015-09-18 NOTE — Telephone Encounter (Signed)
Ok thank you 

## 2015-10-09 ENCOUNTER — Telehealth: Payer: Self-pay | Admitting: Family Medicine

## 2015-10-09 ENCOUNTER — Other Ambulatory Visit: Payer: Self-pay | Admitting: Family Medicine

## 2015-10-09 DIAGNOSIS — Z87898 Personal history of other specified conditions: Secondary | ICD-10-CM

## 2015-10-09 MED ORDER — SCOPOLAMINE 1 MG/3DAYS TD PT72: MEDICATED_PATCH | TRANSDERMAL | Status: DC

## 2015-10-09 NOTE — Telephone Encounter (Signed)
Pt is going on a 5 days cruise next week and is requesting motion sickness patches if possible.  [facility]  WU#[phone]/NFCB#[phone]/MW

## 2015-10-09 NOTE — Telephone Encounter (Signed)
Please advise, KW 

## 2015-10-09 NOTE — Telephone Encounter (Signed)
Patches sent in. Have a good trip!

## 2015-10-10 NOTE — Telephone Encounter (Signed)
Advised patient as below.  

## 2015-10-23 ENCOUNTER — Other Ambulatory Visit: Payer: Self-pay | Admitting: Family Medicine

## 2015-11-15 ENCOUNTER — Ambulatory Visit (INDEPENDENT_AMBULATORY_CARE_PROVIDER_SITE_OTHER): Payer: Managed Care, Other (non HMO) | Admitting: Family Medicine

## 2015-11-15 ENCOUNTER — Encounter: Payer: Self-pay | Admitting: Family Medicine

## 2015-11-15 VITALS — BP 108/70 | HR 69 | Temp 98.7°F | Resp 16 | Ht 67.0 in | Wt 187.0 lb

## 2015-11-15 DIAGNOSIS — R002 Palpitations: Secondary | ICD-10-CM | POA: Insufficient documentation

## 2015-11-15 DIAGNOSIS — R0789 Other chest pain: Secondary | ICD-10-CM

## 2015-11-15 DIAGNOSIS — R42 Dizziness and giddiness: Secondary | ICD-10-CM | POA: Insufficient documentation

## 2015-11-15 DIAGNOSIS — R9431 Abnormal electrocardiogram [ECG] [EKG]: Secondary | ICD-10-CM | POA: Insufficient documentation

## 2015-11-15 NOTE — Patient Instructions (Signed)
Start 81 mg. Aspirin daily and follow up with cardiology.

## 2015-11-15 NOTE — Progress Notes (Signed)
Subjective:     Patient ID: [name], female   DOB: 02-Feb-1976, 40 y.o.   MRN: [id number]  HPI Chief Complaint  Patient presents with  . Shortness of Breath    pt reports chest tightness, sob since Saturday.  . Dizziness  States that on 6/10 she initiated 21 mg. Nicotine patch to try to stop smoking @ 0.5 ppd. She subsequently developed shortness of breath, chest tightness with transient radiation down her left arm,headache and nausea. She discontinued the patch over the last two days but chest tightness persists. No family hx of heart disease. Cholesterol status is unknown. 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.  Review of Systems     Objective:   Physical Exam  Constitutional: She appears well-developed and well-nourished. No distress.  Cardiovascular: Normal rate and regular rhythm.   Pulmonary/Chest: Breath sounds normal.  Musculoskeletal: She exhibits no edema (of lower extremities).       Assessment:    1. Chest tightness: 81 mg. ASA given in the office today. - EKG 12-Lead: EKG changes from prior tracing with ?of old antero-septal M.I. - Renal function panel - Lipid panel - Ambulatory referral to Cardiology    Plan:    Further f/u pending lab work and cardiology evaluation.

## 2015-11-17 LAB — RENAL FUNCTION PANEL
Albumin: 4.4 g/dL (ref 3.5–5.5)
BUN / CREAT RATIO: 15 (ref 9–23)
BUN: 12 mg/dL (ref 6–20)
CALCIUM: 9.3 mg/dL (ref 8.7–10.2)
CO2: 20 mmol/L (ref 18–29)
CREATININE: 0.82 mg/dL (ref 0.57–1.00)
Chloride: 103 mmol/L (ref 96–106)
GFR calc Af Amer: 105 mL/min/{1.73_m2} (ref >59)
GFR, EST NON AFRICAN AMERICAN: 91 mL/min/{1.73_m2} (ref >59)
GLUCOSE: 89 mg/dL (ref 65–99)
POTASSIUM: 4.8 mmol/L (ref 3.5–5.2)
Phosphorus: 3.2 mg/dL (ref 2.5–4.5)
SODIUM: 141 mmol/L (ref 134–144)

## 2015-11-17 LAB — LIPID PANEL
CHOL/HDL RATIO: 3.7 ratio (ref 0.0–4.4)
CHOLESTEROL TOTAL: 173 mg/dL (ref 100–199)
HDL: 47 mg/dL (ref >39)
LDL CALC: 98 mg/dL (ref 0–99)
Triglycerides: 140 mg/dL (ref 0–149)
VLDL Cholesterol Cal: 28 mg/dL (ref 5–40)

## 2015-11-19 ENCOUNTER — Emergency Department
Admission: EM | Admit: 2015-11-19 | Discharge: 2015-11-19 | Disposition: A | Discharge status: Home / Self Care | Payer: Managed Care, Other (non HMO) | Attending: Emergency Medicine | Admitting: Emergency Medicine

## 2015-11-19 ENCOUNTER — Other Ambulatory Visit: Payer: Self-pay

## 2015-11-19 ENCOUNTER — Emergency Department: Payer: Managed Care, Other (non HMO)

## 2015-11-19 ENCOUNTER — Encounter: Payer: Self-pay | Admitting: Emergency Medicine

## 2015-11-19 DIAGNOSIS — F17210 Nicotine dependence, cigarettes, uncomplicated: Secondary | ICD-10-CM | POA: Diagnosis not present

## 2015-11-19 DIAGNOSIS — Z79899 Other long term (current) drug therapy: Secondary | ICD-10-CM | POA: Insufficient documentation

## 2015-11-19 DIAGNOSIS — R079 Chest pain, unspecified: Secondary | ICD-10-CM

## 2015-11-19 DIAGNOSIS — R0789 Other chest pain: Secondary | ICD-10-CM | POA: Diagnosis not present

## 2015-11-19 LAB — BASIC METABOLIC PANEL
Anion gap: 10 (ref 5–15)
BUN: 20 mg/dL (ref 6–20)
CALCIUM: 9.8 mg/dL (ref 8.9–10.3)
CO2: 23 mmol/L (ref 22–32)
CREATININE: 0.9 mg/dL (ref 0.44–1.00)
Chloride: 104 mmol/L (ref 101–111)
GFR calc Af Amer: >60 mL/min (ref >60)
GLUCOSE: 129 mg/dL — AB (ref 65–99)
Potassium: 3.7 mmol/L (ref 3.5–5.1)
Sodium: 137 mmol/L (ref 135–145)

## 2015-11-19 LAB — CBC
HEMATOCRIT: 41.1 % (ref 35.0–47.0)
HEMOGLOBIN: 14 g/dL (ref 12.0–16.0)
MCH: 30.3 pg (ref 26.0–34.0)
MCHC: 33.9 g/dL (ref 32.0–36.0)
MCV: 89.3 fL (ref 80.0–100.0)
Platelets: 277 10*3/uL (ref 150–440)
RBC: 4.61 MIL/uL (ref 3.80–5.20)
RDW: 12.6 % (ref 11.5–14.5)
WBC: 8.1 10*3/uL (ref 3.6–11.0)

## 2015-11-19 LAB — TROPONIN I

## 2015-11-19 MED ORDER — LORAZEPAM 0.5 MG PO TABS
0.5000 mg | ORAL_TABLET | Freq: Once | ORAL | Status: AC
  Administered 2015-11-19: 0.5 mg via ORAL
  Filled 2015-11-19: qty 1

## 2015-11-19 MED ORDER — LORAZEPAM 0.5 MG PO TABS: 0.5000 mg | ORAL_TABLET | Freq: Two times a day (BID) | ORAL | Status: DC | PRN

## 2015-11-19 NOTE — Discharge Instructions (Signed)
You have been seen in the emergency department today for chest pain. Your workup has shown normal results. As we discussed please follow-up with your primary care physician in the next 1-2 days for recheck. Return to the emergency department for any further chest pain, trouble breathing, or any other symptom personally concerning to yourself. ° °Nonspecific Chest Pain °It is often hard to find the cause of chest pain. There is always a chance that your pain could be related to something serious, such as a heart attack or a blood clot in your lungs. Chest pain can also be caused by conditions that are not life-threatening. If you have chest pain, it is very important to follow up with your doctor. ° °HOME CARE °· If you were prescribed an antibiotic medicine, finish it all even if you start to feel better. °· Avoid any activities that cause chest pain. °· Do not use any tobacco products, including cigarettes, chewing tobacco, or electronic cigarettes. If you need help quitting, ask your doctor. °· Do not drink alcohol. °· Take medicines only as told by your doctor. °· Keep all follow-up visits as told by your doctor. This is important. This includes any further testing if your chest pain does not go away. °· Your doctor may tell you to keep your head raised (elevated) while you sleep. °· Make lifestyle changes as told by your doctor. These may include: °¨ Getting regular exercise. Ask your doctor to suggest some activities that are safe for you. °¨ Eating a heart-healthy diet. Your doctor or a diet specialist (dietitian) can help you to learn healthy eating options. °¨ Maintaining a healthy weight. °¨ Managing diabetes, if necessary. °¨ Reducing stress. °GET HELP IF: °· Your chest pain does not go away, even after treatment. °· You have a rash with blisters on your chest. °· You have a fever. °GET HELP RIGHT AWAY IF: °· Your chest pain is worse. °· You have an increasing cough, or you cough up blood. °· You have  severe belly (abdominal) pain. °· You feel extremely weak. °· You pass out (faint). °· You have chills. °· You have sudden, unexplained chest discomfort. °· You have sudden, unexplained discomfort in your arms, back, neck, or jaw. °· You have shortness of breath at any time. °· You suddenly start to sweat, or your skin gets clammy. °· You feel nauseous. °· You vomit. °· You suddenly feel light-headed or dizzy. °· Your heart begins to beat quickly, or it feels like it is skipping beats. °These symptoms may be an emergency. Do not wait to see if the symptoms will go away. Get medical help right away. Call your local emergency services (911 in the [place] Do not drive yourself to the hospital. °  °This information is not intended to replace advice given to you by your health care provider. Make sure you discuss any questions you have with your health care provider. °  °Document Released: 11/06/2007 Document Revised: 06/10/2014 Document Reviewed: 12/24/2013 °Elsevier Interactive Patient Education ©2016 Elsevier Inc. ° °

## 2015-11-19 NOTE — ED Notes (Signed)
Patient here for CP for last week. Was seen by her PCP (has EKG) and was sent to cardiology. States she wore a halter monitor for 24 hours and turned it in on Wednesday. Has not heard from them. States that she still has pain and she just wanted to be checked out. States she is under some stress from work. States she thought at first it was just anxiety and ignored it for a day or so but got concerned because it did not go away.

## 2015-11-19 NOTE — ED Notes (Signed)
Patient with complaint of chest pressure, shortness of breath, nausea, vomiting, and diaphoresis that started last Saturday. Patient was seen by cardiologist on Wednesday and was told that she did have some ekg changes. Patient is scheduled for stress test Thursday.

## 2015-11-19 NOTE — ED Provider Notes (Signed)
[facility] Emergency Department Provider Note  Time seen: 11:12 PM  I have reviewed the triage vital signs and the nursing notes.   HISTORY  Chief Complaint Chest Pain    HPI [name] is a 40 y.o. female with no past medical history who presents to the emergency department for chest pain. According to the patient beginning approximately 7 days ago she experienced chest tightness and pressure which comes and goes. Patient states she was seen by her primary care physician approximately 5 days ago who did an EKG showing a possible old infarct/septal infarct, as referred to cardiology. Patient saw the cardiologist 4 days ago, who hooked her up to a Holter monitor not receive the results of that yet. She has a stress test scheduled for this Thursday. Patient states the chest pain has not worsened, but has not improved either, comes intermittently throughout the day. Denies trouble breathing. Denies nausea or diaphoresis.     Past Medical History  Diagnosis Date  . IBS (irritable bowel syndrome)   . Hypoglycemia   . History of frequent urinary tract infections     Patient Active Problem List   Diagnosis Date Noted  . Insomnia 12/30/2014  . S/P lumbar spinal fusion 05/06/2013  . Compulsive tobacco user syndrome 03/28/2007  . Headache, migraine 03/28/2007    Past Surgical History  Procedure Laterality Date  . Abdominal hysterectomy      PARTIAL  . Gallbladder surgery    . Toe surgery    . Wisdom tooth extraction    . Diagnostic laparoscopy      test on bladder  . Tubal ligation    . Cholecystectomy    . Maximum access (mas)posterior lumbar interbody fusion (plif) 1 level N/A 05/06/2013    Procedure: LUMBAR FIVE-SACRAL ONE MAXIMUM ACCESS POSTERIOR LUMBAR INTERBODY FUSION;  Surgeon: Tia Alert, MD;  Location: [facility];  Service: Neurosurgery;  Laterality: N/A;  . Back surgery      Current Outpatient Rx  Name  Route  Sig  Dispense   Refill  . nitroGLYCERIN (NITROSTAT) 0.4 MG SL tablet   Sublingual   Place 0.4 mg under the tongue every 5 (five) minutes as needed for chest pain.         . traMADol (ULTRAM) 50 MG tablet   Oral   Take 1 tablet (50 mg total) by mouth every 6 (six) hours as needed.   30 tablet   0   . traZODone (DESYREL) 50 MG tablet   Oral   Take 50 mg by mouth at bedtime as needed for sleep.         . butalbital-acetaminophen-caffeine (FIORICET, ESGIC) 50-325-40 MG tablet   Oral   Take 1 tablet by mouth 2 (two) times daily as needed for headache. Patient not taking: Reported on 11/15/2015   14 tablet   0   . meclizine (ANTIVERT) 25 MG tablet   Oral   Take 1 tablet (25 mg total) by mouth 3 (three) times daily as needed for dizziness. Patient not taking: Reported on 11/15/2015   30 tablet   0   . methocarbamol (ROBAXIN) 500 MG tablet   Oral   Take 2 tablets (1,000 mg total) by mouth every 8 (eight) hours as needed for muscle spasms. Patient not taking: Reported on 11/15/2015   60 tablet   0   . promethazine (PHENERGAN) 25 MG tablet   Oral   Take 1 tablet (25 mg total) by mouth every  8 (eight) hours as needed for nausea or vomiting. Patient not taking: Reported on 11/15/2015   20 tablet   0     Allergies Codeine; Prednisolone; Sulfa antibiotics; and Vicodin  Family History  Problem Relation Age of Onset  . Hypertension Mother   . COPD Mother   . Epilepsy Mother   . Hypertension Father     Social History Social History  Substance Use Topics  . Smoking status: Current Some Day Smoker -- 0.50 packs/day for 20 years    Types: Cigarettes  . Smokeless tobacco: Never Used  . Alcohol Use: No    Review of Systems Constitutional: Negative for fever. Cardiovascular: Positive for intermittent chest pain Respiratory: Negative for shortness of breath. Gastrointestinal: Negative for abdominal pain, vomiting and diarrhea. Musculoskeletal: Negative for back pain. Neurological:  Negative for headache 10-point ROS otherwise negative.  ____________________________________________   PHYSICAL EXAM:  VITAL SIGNS: ED Triage Vitals  Enc Vitals Group     BP 11/19/15 2121 131/71 mmHg     Pulse Rate 11/19/15 2121 86     Resp 11/19/15 2121 20     Temp 11/19/15 2121 97.8 F (36.6 C)     Temp Source 11/19/15 2121 Oral     SpO2 11/19/15 2121 100 %     Weight 11/19/15 2121 184 lb (83.462 kg)     Height 11/19/15 2121  (1.676 m)     Head Cir --      Peak Flow --      Pain Score 11/19/15 2122 7     Pain Loc --      Pain Edu? --      Excl. in GC? --     Constitutional: Alert and oriented. Well appearing and in no distress. Eyes: Normal exam ENT   Head: Normocephalic and atraumatic   Mouth/Throat: Mucous membranes are moist. Cardiovascular: Normal rate, regular rhythm. No murmur Respiratory: Normal respiratory effort without tachypnea nor retractions. Breath sounds are clear  Gastrointestinal: Soft and nontender. No distention.   Musculoskeletal: Nontender with normal range of motion in all extremities. Nontender lower extremities, no edema. Neurologic:  Normal speech and language. No gross focal neurologic deficits Skin:  Skin is warm, dry and intact.  Psychiatric: Mood and affect are normal.  ____________________________________________    EKG  EKG reviewed and interpreted by myself shows normal sinus rhythm at 85 bpm, narrow QRS, normal axis, normal intervals, no concerning ST changes.  ____________________________________________    RADIOLOGY  Negative chest x-ray  ____________________________________________   INITIAL IMPRESSION / ASSESSMENT AND PLAN / ED COURSE  Pertinent labs & imaging results that were available during my care of the patient were reviewed by me and considered in my medical decision making (see chart for details).  The patient presents to the emergency department with chest pain intermittently for the past one  week. Denies any acute worsening, but states it is not improved. Patient has seen cardiology, as a stress test scheduled in 4 days. Denies any chest pain currently. Denies any short of breath, nausea or diaphoresis. Patient's labs are within normal limits including negative troponin. The patient is PERC negative. No birth control/hormone use. Patient has no leg pain or swelling. Patient also states a degree of anxiety, and is not sure if this is anxiety related. I discussed a trial of Ativan, the patient is agreeable. I discussed strict return precautions, the patient is agreeable. She will otherwise follow up with cardiology on Thursday as scheduled for her stress test.  ____________________________________________   FINAL CLINICAL IMPRESSION(S) / ED DIAGNOSES  Chest pain   Minna AntisKevin Dariyah Garduno, MD 11/19/15 [phone]

## 2015-11-23 DIAGNOSIS — R079 Chest pain, unspecified: Secondary | ICD-10-CM | POA: Insufficient documentation

## 2016-01-01 ENCOUNTER — Other Ambulatory Visit: Payer: Self-pay | Admitting: Family Medicine

## 2016-01-01 NOTE — Telephone Encounter (Signed)
Please review and advise. KW 

## 2016-01-16 ENCOUNTER — Encounter: Payer: Self-pay | Admitting: Family Medicine

## 2016-01-16 ENCOUNTER — Ambulatory Visit (INDEPENDENT_AMBULATORY_CARE_PROVIDER_SITE_OTHER): Payer: PRIVATE HEALTH INSURANCE | Admitting: Family Medicine

## 2016-01-16 VITALS — BP 110/74 | HR 60 | Temp 98.6°F | Resp 16 | Wt 187.8 lb

## 2016-01-16 DIAGNOSIS — M6249 Contracture of muscle, multiple sites: Secondary | ICD-10-CM

## 2016-01-16 DIAGNOSIS — M62838 Other muscle spasm: Secondary | ICD-10-CM

## 2016-01-16 DIAGNOSIS — M5412 Radiculopathy, cervical region: Secondary | ICD-10-CM

## 2016-01-16 MED ORDER — PREDNISONE 10 MG PO TABS: ORAL_TABLET | ORAL | 0 refills | Status: DC

## 2016-01-16 NOTE — Patient Instructions (Signed)
May use Tylenol extra strength 500 mg. Two pills 3 x day. Not to exceed 3000 mg./day. Warm compresses for 20 minutes several x day may help spasms.

## 2016-01-16 NOTE — Progress Notes (Signed)
Subjective:     Patient ID: [name], female   DOB: [birth year], 40 y.o.   MRN: [id number]  HPI  Chief Complaint  Patient presents with  . Neck Pain    Patient comes in office today with complaints of neck and shoulder pain that start suddenly on 01/13/16. Patient reports difficulty moving head and looking up and down, pain from her neck radiates down to both shoulders and to her left hand. Patient states that she has numbness and tingling in her left fingers. Patient reports taking Robaxin and Tramadol for relief but reports that pain has persisted.   States that she had similar episode in April which improved with prednisone. Tingling is in her left third and fourth fingers and pain radiates to her elbown. Cervical x-rays in April c/w cervical DDD. Denies specific injury. Prior hx of lumbar fusion. Accompanied by her husband, [place], today. 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   Review of Systems     Objective:   Physical Exam  Constitutional: She appears well-developed and well-nourished. Distressed: holding her head rigidly    Musculoskeletal:  Grip/EE/EF/shoulder 5/5. Cervical ROM limited in flexion and extension with increased pain. Tender over her posterior cervical and upper trapezius areas.       Assessment:    1. Radiculopathy of cervical spine - predniSONE (DELTASONE) 10 MG tablet; Taper as follows: 4 pills x 2 days, 3 pills x 2 days, two pills x 2 days, one pill x 2 days  Dispense: 22 tablet; Refill: 0  2. Cervical paraspinal muscle spasm    Plan:    Discussed use of warm compresses, Tylenol and Robaxin as needed. Consider further imaging if not improving.

## 2016-03-11 ENCOUNTER — Encounter: Payer: Self-pay | Admitting: Physician Assistant

## 2016-03-11 ENCOUNTER — Ambulatory Visit (INDEPENDENT_AMBULATORY_CARE_PROVIDER_SITE_OTHER): Payer: No Typology Code available for payment source | Admitting: Physician Assistant

## 2016-03-11 VITALS — BP 118/70 | Temp 99.0°F | Resp 16 | Wt 194.0 lb

## 2016-03-11 DIAGNOSIS — F339 Major depressive disorder, recurrent, unspecified: Secondary | ICD-10-CM

## 2016-03-11 DIAGNOSIS — J019 Acute sinusitis, unspecified: Secondary | ICD-10-CM

## 2016-03-11 MED ORDER — PAROXETINE HCL 20 MG PO TABS: 20.0000 mg | ORAL_TABLET | Freq: Every day | ORAL | 0 refills | Status: DC

## 2016-03-11 MED ORDER — PAROXETINE HCL 20 MG PO TABS
20.0000 mg | ORAL_TABLET | Freq: Every day | ORAL | 0 refills | Status: DC
Start: 2016-03-11 — End: 2016-12-08

## 2016-03-11 NOTE — Progress Notes (Signed)
Patient: [name] Female    DOB: Mar 21, 1976   40 y.o.   MRN: [id number] Visit Date: 03/11/2016  Today's Provider: Trey Sailors, PA-C   Chief Complaint  Patient presents with  . URI   Subjective:    HPI Patient comes in today c/o a possible sinus infection. She reports that she has congestion in her head, nasal congestion, PND, ear fullness, and cough ongoing for 6 days. She denies facial pain. She reports that she has also felt feverish. She has been taking Mucinex OTC with no relief.     Patient also wants to discuss starting back on Paxil. She is reporting problems with her current husband and ex-husband, as well as her children. She reports feeling down for about one month, and having sleep, eating and concentration disturbances. She denies thoughts of harming herself or ending her life. She reports that she has taken this before in the past in 2010 and it helped with her symptoms.   Depression screen [facility] 2/9 03/11/2016 11/15/2015  Decreased Interest 2 0  Down, Depressed, Hopeless 2 0  PHQ - 2 Score 4 0  Altered sleeping 3 -  Tired, decreased energy 3 -  Change in appetite 3 -  Feeling bad or failure about yourself  2 -  Trouble concentrating 2 -  Moving slowly or fidgety/restless 1 -  Suicidal thoughts 0 -  PHQ-9 Score 18 -  Difficult doing work/chores Very difficult -      Allergies  Allergen Reactions  . Codeine Nausea And Vomiting  . Prednisolone     Other reaction(s): Dizziness, Irregular heart rate  . Sulfa Antibiotics Rash  . Vicodin [Hydrocodone-Acetaminophen] Nausea Only    And itching all over     Current Outpatient Prescriptions:  .  nitroGLYCERIN (NITROSTAT) 0.4 MG SL tablet, Place 0.4 mg under the tongue every 5 (five) minutes as needed for chest pain., Disp: , Rfl:  .  traZODone (DESYREL) 50 MG tablet, TAKE 1 TO 2 TABLETS BY MOUTH EVERY NIGHTAT BEDTIME AS NEEDED FOR INSOMNIA, Disp: 60 tablet, Rfl: 5 .  PARoxetine (PAXIL) 20 MG  tablet, Take 1 tablet (20 mg total) by mouth daily., Disp: 30 tablet, Rfl: 0  Review of Systems  Constitutional: Positive for activity change, appetite change, chills and fatigue.  HENT: Positive for congestion, ear pain, postnasal drip, sinus pressure, sneezing, sore throat, trouble swallowing and voice change.   Respiratory: Positive for cough.   Cardiovascular: Negative.   Neurological: Positive for light-headedness and headaches.  Psychiatric/Behavioral: Positive for agitation, decreased concentration and sleep disturbance. Negative for suicidal ideas. The patient is nervous/anxious.     Social History  Substance Use Topics  . Smoking status: Former Smoker    Packs/day: 0.50    Years: 20.00    Types: Cigarettes    Quit date: 02/26/2016  . Smokeless tobacco: Never Used  . Alcohol use No   Objective:   BP 118/70 (BP Location: Left Arm, Patient Position: Sitting, Cuff Size: Normal)   Temp 99 F (37.2 C)   Resp 16   Wt 194 lb (88 kg)   BMI 31.31 kg/m   Physical Exam  Constitutional: She is oriented to person, place, and time. She appears well-developed and well-nourished. No distress.  HENT:  Right Ear: External ear normal.  Left Ear: External ear normal.  Ears:  Nose: Nose normal. Right sinus exhibits no maxillary sinus tenderness and no frontal sinus tenderness. Left sinus exhibits no maxillary  sinus tenderness and no frontal sinus tenderness.  Mouth/Throat: No oropharyngeal exudate.  Eyes: Conjunctivae are normal. Right eye exhibits no discharge. Left eye exhibits no discharge.  Neck: Neck supple.  Cardiovascular: Normal rate and regular rhythm.   Pulmonary/Chest: Effort normal and breath sounds normal. No respiratory distress. She has no wheezes. She has no rales.  Lymphadenopathy:    She has no cervical adenopathy.  Neurological: She is alert and oriented to person, place, and time.  Skin: Skin is warm and dry. She is not diaphoretic.  Psychiatric: Her behavior is  normal. She exhibits a depressed mood.  Patient is tearful in exam room.        Assessment & Plan:   Problem List Items Addressed This Visit    None    Visit Diagnoses    Acute sinusitis, recurrence not specified, unspecified location    -  Primary   Recurrent major depressive disorder, remission status unspecified (HCC)       Relevant Medications   PARoxetine (PAXIL) 20 MG tablet     Sinusitis  Patient still in duration of symptoms where sinusitis is considered viral. Instructed to use Sudafed for symptomatic relief and call back in three days if symptoms have not stopped, will call in abx.  Depression  Patient presenting with recurrent episode of depression, last episode being in 2010, documented in [facility] archive, when she was on Paxil. Patient prescribed Paxil. Patient educated and counseled on risk of serotonin syndrome with simultaneous use of trazodone. Patient and I discussed benefits and costs and we have mutually decided that it would be better for her to take Paxil than to not. Patient willing to not take trazodone at night to see if Paxil helps with her sleep. Counseled on symptoms such as persistent fever, agitation, and muscle contractions that would prompt her to discontinue medications and go to emergency room. Patient decline referral to counseling at this time, citing insurance issues. Will look into community resources to be discussed at next visit. Will see her back in 2 weeks.  The entirety of the information documented in the History of Present Illness, Review of Systems and Physical Exam were personally obtained by me. Portions of this information were initially [facility] by [facility] and reviewed by me for thoroughness and accuracy.    Return in about 2 weeks (around 03/25/2016) for depression.   Patient Instructions  Sinusitis, Adult Sinusitis is redness, soreness, and inflammation of the paranasal sinuses. Paranasal sinuses are air pockets within the bones  of your face. They are located beneath your eyes, in the middle of your forehead, and above your eyes. In healthy paranasal sinuses, mucus is able to drain out, and air is able to circulate through them by way of your nose. However, when your paranasal sinuses are inflamed, mucus and air can become trapped. This can allow bacteria and other germs to grow and cause infection. Sinusitis can develop quickly and last only a short time (acute) or continue over a long period (chronic). Sinusitis that lasts for more than 12 weeks is considered chronic. CAUSES Causes of sinusitis include:  Allergies.  Structural abnormalities, such as displacement of the cartilage that separates your nostrils (deviated septum), which can decrease the air flow through your nose and sinuses and affect sinus drainage.  Functional abnormalities, such as when the small hairs (cilia) that line your sinuses and help remove mucus do not work properly or are not present. SIGNS AND SYMPTOMS Symptoms of acute and chronic sinusitis are the  same. The primary symptoms are pain and pressure around the affected sinuses. Other symptoms include:  Upper toothache.  Earache.  Headache.  Bad breath.  Decreased sense of smell and taste.  A cough, which worsens when you are lying flat.  Fatigue.  Fever.  Thick drainage from your nose, which often is green and may contain pus (purulent).  Swelling and warmth over the affected sinuses. DIAGNOSIS Your health care provider will perform a physical exam. During your exam, your health care provider may perform any of the following to help determine if you have acute sinusitis or chronic sinusitis:  Look in your nose for signs of abnormal growths in your nostrils (nasal polyps).  Tap over the affected sinus to check for signs of infection.  View the inside of your sinuses using an imaging device that has a light attached (endoscope). If your health care provider suspects that you  have chronic sinusitis, one or more of the following tests may be recommended:  Allergy tests.  Nasal culture. A sample of mucus is taken from your nose, sent to a lab, and screened for bacteria.  Nasal cytology. A sample of mucus is taken from your nose and examined by your health care provider to determine if your sinusitis is related to an allergy. TREATMENT Most cases of acute sinusitis are related to a viral infection and will resolve on their own within 10 days. Sometimes, medicines are prescribed to help relieve symptoms of both acute and chronic sinusitis. These may include pain medicines, decongestants, nasal steroid sprays, or saline sprays. However, for sinusitis related to a bacterial infection, your health care provider will prescribe antibiotic medicines. These are medicines that will help kill the bacteria causing the infection. Rarely, sinusitis is caused by a fungal infection. In these cases, your health care provider will prescribe antifungal medicine. For some cases of chronic sinusitis, surgery is needed. Generally, these are cases in which sinusitis recurs more than 3 times per year, despite other treatments. HOME CARE INSTRUCTIONS  Drink plenty of water. Water helps thin the mucus so your sinuses can drain more easily.  Use a humidifier.  Inhale steam 3-4 times a day (for example, sit in the bathroom with the shower running).  Apply a warm, moist washcloth to your face 3-4 times a day, or as directed by your health care provider.  Use saline nasal sprays to help moisten and clean your sinuses.  Take medicines only as directed by your health care provider.  If you were prescribed either an antibiotic or antifungal medicine, finish it all even if you start to feel better. SEEK IMMEDIATE MEDICAL CARE IF:  You have increasing pain or severe headaches.  You have nausea, vomiting, or drowsiness.  You have swelling around your face.  You have vision problems.  You  have a stiff neck.  You have difficulty breathing.   This information is not intended to replace advice given to you by your health care provider. Make sure you discuss any questions you have with your health care provider.   Document Released: 05/20/2005 Document Revised: 06/10/2014 Document Reviewed: 06/04/2011 Elsevier Interactive Patient Education 2016 [employer].          Trey Sailors, PA-C  [facility]

## 2016-03-11 NOTE — Addendum Note (Signed)
Addended by: Trey SailorsPOLLAK, ADRIANA M on: 03/11/2016 09:05 PM   Modules accepted: Orders

## 2016-03-11 NOTE — Patient Instructions (Signed)

## 2016-03-12 ENCOUNTER — Ambulatory Visit: Payer: No Typology Code available for payment source | Admitting: Physician Assistant

## 2016-03-12 NOTE — Addendum Note (Signed)
Addended by: Trey SailorsPOLLAK, Estephanie Hubbs M on: 03/12/2016 08:27 AM   Modules accepted: Orders

## 2016-03-25 ENCOUNTER — Ambulatory Visit: Payer: No Typology Code available for payment source | Admitting: Physician Assistant

## 2016-07-10 ENCOUNTER — Other Ambulatory Visit: Payer: Self-pay | Admitting: Family Medicine

## 2016-08-07 ENCOUNTER — Other Ambulatory Visit: Payer: Self-pay | Admitting: Family Medicine

## 2016-12-07 ENCOUNTER — Emergency Department: Payer: Medicaid Other

## 2016-12-07 ENCOUNTER — Observation Stay
Admission: EM | Admit: 2016-12-07 | Discharge: 2016-12-09 | Disposition: A | Discharge status: Home / Self Care | Payer: Medicaid Other | Attending: General Surgery | Admitting: General Surgery

## 2016-12-07 DIAGNOSIS — Z882 Allergy status to sulfonamides status: Secondary | ICD-10-CM | POA: Diagnosis not present

## 2016-12-07 DIAGNOSIS — Z79899 Other long term (current) drug therapy: Secondary | ICD-10-CM | POA: Insufficient documentation

## 2016-12-07 DIAGNOSIS — K353 Acute appendicitis with localized peritonitis, without perforation or gangrene: Secondary | ICD-10-CM

## 2016-12-07 DIAGNOSIS — Z8719 Personal history of other diseases of the digestive system: Secondary | ICD-10-CM | POA: Diagnosis not present

## 2016-12-07 DIAGNOSIS — Z885 Allergy status to narcotic agent status: Secondary | ICD-10-CM | POA: Diagnosis not present

## 2016-12-07 DIAGNOSIS — K381 Appendicular concretions: Secondary | ICD-10-CM | POA: Insufficient documentation

## 2016-12-07 DIAGNOSIS — Z87891 Personal history of nicotine dependence: Secondary | ICD-10-CM | POA: Diagnosis not present

## 2016-12-07 DIAGNOSIS — K3589 Other acute appendicitis: Principal | ICD-10-CM | POA: Insufficient documentation

## 2016-12-07 DIAGNOSIS — K36 Other appendicitis: Secondary | ICD-10-CM | POA: Diagnosis not present

## 2016-12-07 DIAGNOSIS — Z87440 Personal history of urinary (tract) infections: Secondary | ICD-10-CM | POA: Diagnosis not present

## 2016-12-07 DIAGNOSIS — K37 Unspecified appendicitis: Secondary | ICD-10-CM | POA: Diagnosis present

## 2016-12-07 LAB — URINALYSIS, COMPLETE (UACMP) WITH MICROSCOPIC
BILIRUBIN URINE: NEGATIVE
Glucose, UA: NEGATIVE mg/dL
KETONES UR: NEGATIVE mg/dL
LEUKOCYTES UA: NEGATIVE
Nitrite: NEGATIVE
Protein, ur: NEGATIVE mg/dL
SPECIFIC GRAVITY, URINE: 1.006 (ref 1.005–1.030)
pH: 5 (ref 5.0–8.0)

## 2016-12-07 LAB — COMPREHENSIVE METABOLIC PANEL
ALBUMIN: 4.3 g/dL (ref 3.5–5.0)
ALT: 16 U/L (ref 14–54)
AST: 19 U/L (ref 15–41)
Alkaline Phosphatase: 78 U/L (ref 38–126)
Anion gap: 8 (ref 5–15)
BUN: 15 mg/dL (ref 6–20)
CHLORIDE: 108 mmol/L (ref 101–111)
CO2: 21 mmol/L — AB (ref 22–32)
CREATININE: 0.77 mg/dL (ref 0.44–1.00)
Calcium: 9.1 mg/dL (ref 8.9–10.3)
GFR calc Af Amer: >60 mL/min (ref >60)
Glucose, Bld: 84 mg/dL (ref 65–99)
POTASSIUM: 3.7 mmol/L (ref 3.5–5.1)
SODIUM: 137 mmol/L (ref 135–145)
Total Bilirubin: 0.4 mg/dL (ref 0.3–1.2)
Total Protein: 7.4 g/dL (ref 6.5–8.1)

## 2016-12-07 LAB — CBC
HEMATOCRIT: 40.6 % (ref 35.0–47.0)
Hemoglobin: 13.8 g/dL (ref 12.0–16.0)
MCH: 30.7 pg (ref 26.0–34.0)
MCHC: 33.9 g/dL (ref 32.0–36.0)
MCV: 90.4 fL (ref 80.0–100.0)
Platelets: 286 10*3/uL (ref 150–440)
RBC: 4.49 MIL/uL (ref 3.80–5.20)
RDW: 13 % (ref 11.5–14.5)
WBC: 9.6 10*3/uL (ref 3.6–11.0)

## 2016-12-07 LAB — LIPASE, BLOOD: LIPASE: 27 U/L (ref 11–51)

## 2016-12-07 MED ORDER — ONDANSETRON HCL 4 MG/2ML IJ SOLN
4.0000 mg | Freq: Once | INTRAMUSCULAR | Status: AC
  Administered 2016-12-07: 4 mg via INTRAVENOUS
  Filled 2016-12-07: qty 2

## 2016-12-07 MED ORDER — DEXTROSE 5 % IV SOLN
1.0000 g | Freq: Once | INTRAVENOUS | Status: AC
  Administered 2016-12-07: 1 g via INTRAVENOUS
  Filled 2016-12-07: qty 10

## 2016-12-07 MED ORDER — IOPAMIDOL (ISOVUE-300) INJECTION 61%
15.0000 mL | INTRAVENOUS | Status: DC
  Administered 2016-12-07: 15 mL via ORAL

## 2016-12-07 MED ORDER — SODIUM CHLORIDE 0.9 % IV BOLUS (SEPSIS)
1000.0000 mL | Freq: Once | INTRAVENOUS | Status: AC
  Administered 2016-12-07: 1000 mL via INTRAVENOUS

## 2016-12-07 MED ORDER — FENTANYL CITRATE (PF) 100 MCG/2ML IJ SOLN
50.0000 ug | Freq: Once | INTRAMUSCULAR | Status: AC
  Administered 2016-12-07: 50 ug via INTRAVENOUS
  Filled 2016-12-07: qty 2

## 2016-12-07 MED ORDER — CEPHALEXIN 500 MG PO CAPS: 500.0000 mg | ORAL_CAPSULE | Freq: Three times a day (TID) | ORAL | 0 refills | Status: DC

## 2016-12-07 MED ORDER — IOPAMIDOL (ISOVUE-300) INJECTION 61%
100.0000 mL | Freq: Once | INTRAVENOUS | Status: AC | PRN
Start: 2016-12-07 — End: 2016-12-07
  Administered 2016-12-07: 100 mL via INTRAVENOUS

## 2016-12-07 NOTE — ED Triage Notes (Signed)
Pt presents to ED via POV with s/o and c/o epigastric pain with radiation into "mainly the right side" of her upper back. Pt reports (+) nausea, denies vomiting or diarrhea.Pt reports pain started yesterday afternoon; pt reports pain "comes and goes" but nothing makes it better or worse. Pt reports 2 nights of "waking up" with urinary urgency, but denies other urinary s/x's or c/o's. Last BM was this morning and "was more than usual and smelled worse than normal".

## 2016-12-07 NOTE — ED Provider Notes (Signed)
Clinical Course as of Dec 08 709  Sat Dec 07, 2016  2338 Assuming care from Dr. Lenard LancePaduchowski.  In short, [name] is a 41 y.o. female with a chief complaint of abdominal pain.  Refer to the original H&P for additional details.  The current plan of care is to check CT scan and reassess. Received empiric abx.  Patient is s/p cholecystectomy.   [CF]  Sun Dec 08, 2016  0037 Early appendicitis on CT scan.  Updated patient.  Called Dr. Tonita CongWoodham who will come to ED to evaluate the patient in person.    [CF]    Clinical Course User Index [CF] Loleta RoseForbach, Bristol Soy, MD      Loleta RoseForbach, Enaya Howze, MD 12/08/16 [phone]

## 2016-12-07 NOTE — ED Provider Notes (Signed)
[facility] Emergency Department Provider Note  Time seen: 10:19 PM  I have reviewed the triage vital signs and the nursing notes.   HISTORY  Chief Complaint Abdominal Pain    HPI [name] is a 41 y.o. female with a past medical history of cholecystectomy in 2004 presents the emergency department for right sided abdominal pain. According to the patient for the past 2 days she has been experiencing upper and right-sided abdominal pain now with low-grade fever today. Patient states nausea but denies any vomiting. States it is a dull aching pain that comes and goes located mostly along the right side and occasionally into the epigastrium. States her volume stool today. Denies any black or bloody stool. Denies diarrhea. Patient has temperature of 99.8 in the emergency department denies any known fever at home.  Past Medical History:  Diagnosis Date  . History of frequent urinary tract infections   . Hypoglycemia   . IBS (irritable bowel syndrome)     Patient Active Problem List   Diagnosis Date Noted  . Insomnia 12/30/2014  . S/P lumbar spinal fusion 05/06/2013  . Compulsive tobacco user syndrome 03/28/2007  . Headache, migraine 03/28/2007    Past Surgical History:  Procedure Laterality Date  . ABDOMINAL HYSTERECTOMY     PARTIAL  . BACK SURGERY    . CHOLECYSTECTOMY    . DIAGNOSTIC LAPAROSCOPY     test on bladder  . GALLBLADDER SURGERY    . MAXIMUM ACCESS (MAS)POSTERIOR LUMBAR INTERBODY FUSION (PLIF) 1 LEVEL N/A 05/06/2013   Procedure: LUMBAR FIVE-SACRAL ONE MAXIMUM ACCESS POSTERIOR LUMBAR INTERBODY FUSION;  Surgeon: Tia Alert, MD;  Location: [facility];  Service: Neurosurgery;  Laterality: N/A;  . TOE SURGERY    . TUBAL LIGATION    . WISDOM TOOTH EXTRACTION      Prior to Admission medications   Medication Sig Start Date End Date Taking? Authorizing Provider  nitroGLYCERIN (NITROSTAT) 0.4 MG SL tablet Place 0.4 mg under the tongue  every 5 (five) minutes as needed for chest pain.    [provider]  PARoxetine (PAXIL) 20 MG tablet Take 1 tablet (20 mg total) by mouth daily. 03/11/16 04/10/16  Trey Sailors, PA-C  traZODone (DESYREL) 50 MG tablet TAKE 1 TO 2 TABLETS BY MOUTH EVERY NIGHTAT BEDTIME AS NEEDED FOR INSOMNIA 08/07/16   Anola Gurney, PA    Allergies  Allergen Reactions  . Codeine Nausea And Vomiting  . Prednisolone     Other reaction(s): Dizziness, Irregular heart rate  . Sulfa Antibiotics Rash  . Vicodin [Hydrocodone-Acetaminophen] Nausea Only    And itching all over    Family History  Problem Relation Age of Onset  . Hypertension Mother   . COPD Mother   . Epilepsy Mother   . Hypertension Father     Social History Social History  Substance Use Topics  . Smoking status: Former Smoker    Packs/day: 0.50    Years: 20.00    Types: Cigarettes    Quit date: 02/26/2016  . Smokeless tobacco: Never Used  . Alcohol use No    Review of Systems Constitutional: Negative for fever. Cardiovascular: Negative for chest pain. Respiratory: Negative for shortness of breath. Gastrointestinal: Positive for abdominal pain mostly in the right side. Positive for nausea. Negative for vomiting or diarrhea Genitourinary: Negative for dysuria. Musculoskeletal: Negative for back pain Neurological: Negative for headache All other ROS negative  ____________________________________________   PHYSICAL EXAM:  VITAL SIGNS: ED Triage Vitals  Enc Vitals Group     BP 12/07/16 2118 134/81     Pulse Rate 12/07/16 2118 72     Resp 12/07/16 2118 18     Temp 12/07/16 2118 99.8 F (37.7 C)     Temp Source 12/07/16 2118 Oral     SpO2 12/07/16 2118 99 %     Weight 12/07/16 2112 193 lb (87.5 kg)     Height 12/07/16 2112 5\' 6"  (1.676 m)     Head Circumference --      Peak Flow --      Pain Score 12/07/16 2111 7     Pain Loc --      Pain Edu? --      Excl. in GC? --     Constitutional: Alert and  oriented. Well appearing and in no distress. Eyes: Normal exam ENT   Head: Normocephalic and atraumatic.   Mouth/Throat: Mucous membranes are moist. Cardiovascular: Normal rate, regular rhythm. No murmur Respiratory: Normal respiratory effort without tachypnea nor retractions. Breath sounds are clear  Gastrointestinal: Soft, mild right-sided abdominal tenderness palpation, no epigastric tenderness, abdomen otherwise benign. Soft, nondistended no rebound or guarding. Musculoskeletal: Nontender with normal range of motion in all extremities. Neurologic:  Normal speech and language. No gross focal neurologic deficits Skin:  Skin is warm, dry and intact.  Psychiatric: Mood and affect are normal.  ____________________________________________    EKG  EKG reviewed and interpreted by myself shows normal sinus rhythm at 68 bpm, narrow QRS, normal axis, normal intervals, no ST changes. Normal EKG. ____________________________________________    RADIOLOGY  CT pending  ____________________________________________   INITIAL IMPRESSION / ASSESSMENT AND PLAN / ED COURSE  Pertinent labs & imaging results that were available during my care of the patient were reviewed by me and considered in my medical decision making (see chart for details).  The patient presents to the emergency department for abdominal pain for the past 2 days mostly right-sided. Low-grade temperature 99.8 in the emergency department. Patient does have mild right-sided tenderness palpation, abdomen otherwise benign however given the right-sided tenderness low-grade fever we will check labs and likely proceed with a CT scan of the abdomen/pelvis to further evaluate. Patient is status post cholecystectomy in 2004.  Patient's labs are largely normal besides her urinalysis shows many bacteria, I will add on a urine culture, we will cover with antibiotics while awaiting CT scan.  Patient care signed out to dr.  York Ceriseforbach.  ____________________________________________   FINAL CLINICAL IMPRESSION(S) / ED DIAGNOSES  Abdominal pain Urinary tract infection   Minna AntisPaduchowski, Journiee Feldkamp, MD 12/07/16 2321

## 2016-12-07 NOTE — ED Notes (Signed)
Lab verbalizes acknowledgment of add-on urine culture to previously collected UA.

## 2016-12-08 ENCOUNTER — Observation Stay: Payer: Medicaid Other | Admitting: Anesthesiology

## 2016-12-08 ENCOUNTER — Encounter
Admission: EM | Disposition: A | Discharge status: Home / Self Care | Payer: Self-pay | Source: Home / Self Care | Attending: Emergency Medicine

## 2016-12-08 ENCOUNTER — Encounter: Payer: Self-pay | Admitting: Anesthesiology

## 2016-12-08 DIAGNOSIS — K37 Unspecified appendicitis: Secondary | ICD-10-CM | POA: Diagnosis present

## 2016-12-08 DIAGNOSIS — K353 Acute appendicitis with localized peritonitis: Secondary | ICD-10-CM

## 2016-12-08 DIAGNOSIS — K3580 Unspecified acute appendicitis: Secondary | ICD-10-CM

## 2016-12-08 HISTORY — PX: LAPAROSCOPIC APPENDECTOMY: SHX408

## 2016-12-08 LAB — COMPREHENSIVE METABOLIC PANEL
ALBUMIN: 3.6 g/dL (ref 3.5–5.0)
ALK PHOS: 74 U/L (ref 38–126)
ALT: 57 U/L — AB (ref 14–54)
ANION GAP: 4 — AB (ref 5–15)
AST: 132 U/L — AB (ref 15–41)
BILIRUBIN TOTAL: 0.8 mg/dL (ref 0.3–1.2)
BUN: 11 mg/dL (ref 6–20)
CALCIUM: 8 mg/dL — AB (ref 8.9–10.3)
CO2: 22 mmol/L (ref 22–32)
CREATININE: 0.74 mg/dL (ref 0.44–1.00)
Chloride: 111 mmol/L (ref 101–111)
GFR calc Af Amer: >60 mL/min (ref >60)
GFR calc non Af Amer: >60 mL/min (ref >60)
GLUCOSE: 106 mg/dL — AB (ref 65–99)
Potassium: 3.6 mmol/L (ref 3.5–5.1)
SODIUM: 137 mmol/L (ref 135–145)
TOTAL PROTEIN: 6.3 g/dL — AB (ref 6.5–8.1)

## 2016-12-08 LAB — CBC
HCT: 35.8 % (ref 35.0–47.0)
HEMOGLOBIN: 12.5 g/dL (ref 12.0–16.0)
MCH: 31.8 pg (ref 26.0–34.0)
MCHC: 35.1 g/dL (ref 32.0–36.0)
MCV: 90.6 fL (ref 80.0–100.0)
PLATELETS: 230 10*3/uL (ref 150–440)
RBC: 3.95 MIL/uL (ref 3.80–5.20)
RDW: 13 % (ref 11.5–14.5)
WBC: 9.6 10*3/uL (ref 3.6–11.0)

## 2016-12-08 LAB — MRSA PCR SCREENING: MRSA by PCR: NEGATIVE

## 2016-12-08 SURGERY — APPENDECTOMY, LAPAROSCOPIC
Anesthesia: General

## 2016-12-08 MED ORDER — CHLORHEXIDINE GLUCONATE CLOTH 2 % EX PADS: 6.0000 | MEDICATED_PAD | Freq: Once | CUTANEOUS | Status: DC

## 2016-12-08 MED ORDER — LACTATED RINGERS IV SOLN
INTRAVENOUS | Status: DC | PRN
  Administered 2016-12-08: 13:00:00 via INTRAVENOUS

## 2016-12-08 MED ORDER — FENTANYL CITRATE (PF) 100 MCG/2ML IJ SOLN
INTRAMUSCULAR | Status: AC
  Filled 2016-12-08: qty 2

## 2016-12-08 MED ORDER — ACETAMINOPHEN 10 MG/ML IV SOLN
INTRAVENOUS | Status: AC
  Filled 2016-12-08: qty 100

## 2016-12-08 MED ORDER — NICOTINE 21 MG/24HR TD PT24: 21.0000 mg | MEDICATED_PATCH | Freq: Every day | TRANSDERMAL | Status: DC

## 2016-12-08 MED ORDER — NICOTINE 21 MG/24HR TD PT24
21.0000 mg | MEDICATED_PATCH | Freq: Every day | TRANSDERMAL | Status: DC
  Administered 2016-12-08: 21 mg via TRANSDERMAL
  Filled 2016-12-08: qty 1

## 2016-12-08 MED ORDER — PROPOFOL 10 MG/ML IV BOLUS
INTRAVENOUS | Status: DC | PRN
  Administered 2016-12-08: 150 mg via INTRAVENOUS

## 2016-12-08 MED ORDER — PHENYLEPHRINE HCL 10 MG/ML IJ SOLN
INTRAMUSCULAR | Status: DC | PRN
  Administered 2016-12-08: 200 ug via INTRAVENOUS

## 2016-12-08 MED ORDER — SUGAMMADEX SODIUM 500 MG/5ML IV SOLN
INTRAVENOUS | Status: AC
  Filled 2016-12-08: qty 5

## 2016-12-08 MED ORDER — ROCURONIUM BROMIDE 50 MG/5ML IV SOLN
INTRAVENOUS | Status: AC
  Filled 2016-12-08: qty 1

## 2016-12-08 MED ORDER — PANTOPRAZOLE SODIUM 40 MG IV SOLR
40.0000 mg | Freq: Every day | INTRAVENOUS | Status: DC
  Administered 2016-12-08 (×2): 40 mg via INTRAVENOUS
  Filled 2016-12-08 (×2): qty 40

## 2016-12-08 MED ORDER — TRAZODONE HCL 100 MG PO TABS
100.0000 mg | ORAL_TABLET | Freq: Every day | ORAL | Status: DC
  Administered 2016-12-08: 100 mg via ORAL
  Filled 2016-12-08: qty 1

## 2016-12-08 MED ORDER — DEXTROSE IN LACTATED RINGERS 5 % IV SOLN
INTRAVENOUS | Status: DC
  Administered 2016-12-08 – 2016-12-09 (×4): via INTRAVENOUS

## 2016-12-08 MED ORDER — LIDOCAINE HCL (CARDIAC) 20 MG/ML IV SOLN
INTRAVENOUS | Status: DC | PRN
  Administered 2016-12-08: 100 mg via INTRAVENOUS

## 2016-12-08 MED ORDER — MORPHINE SULFATE (PF) 4 MG/ML IV SOLN
4.0000 mg | INTRAVENOUS | Status: DC | PRN
  Administered 2016-12-08 (×2): 4 mg via INTRAVENOUS
  Filled 2016-12-08 (×2): qty 1

## 2016-12-08 MED ORDER — MIDAZOLAM HCL 2 MG/2ML IJ SOLN
INTRAMUSCULAR | Status: AC
  Filled 2016-12-08: qty 2

## 2016-12-08 MED ORDER — KETOROLAC TROMETHAMINE 30 MG/ML IJ SOLN
INTRAMUSCULAR | Status: AC
  Filled 2016-12-08: qty 1

## 2016-12-08 MED ORDER — FLUCONAZOLE IN SODIUM CHLORIDE 100-0.9 MG/50ML-% IV SOLN
100.0000 mg | INTRAVENOUS | Status: DC
  Filled 2016-12-08: qty 50

## 2016-12-08 MED ORDER — EPHEDRINE SULFATE 50 MG/ML IJ SOLN
INTRAMUSCULAR | Status: DC | PRN
  Administered 2016-12-08: 10 mg via INTRAVENOUS

## 2016-12-08 MED ORDER — ROCURONIUM BROMIDE 100 MG/10ML IV SOLN
INTRAVENOUS | Status: DC | PRN
  Administered 2016-12-08: 10 mg via INTRAVENOUS
  Administered 2016-12-08: 40 mg via INTRAVENOUS

## 2016-12-08 MED ORDER — ONDANSETRON HCL 4 MG/2ML IJ SOLN
4.0000 mg | Freq: Once | INTRAMUSCULAR | Status: AC
  Administered 2016-12-08: 4 mg via INTRAVENOUS

## 2016-12-08 MED ORDER — DIPHENHYDRAMINE HCL 25 MG PO CAPS: 25.0000 mg | ORAL_CAPSULE | Freq: Four times a day (QID) | ORAL | Status: DC | PRN

## 2016-12-08 MED ORDER — SUCCINYLCHOLINE CHLORIDE 20 MG/ML IJ SOLN
INTRAMUSCULAR | Status: AC
  Filled 2016-12-08: qty 1

## 2016-12-08 MED ORDER — EPHEDRINE SULFATE 50 MG/ML IJ SOLN
INTRAMUSCULAR | Status: AC
  Filled 2016-12-08: qty 1

## 2016-12-08 MED ORDER — LIDOCAINE HCL (PF) 2 % IJ SOLN
INTRAMUSCULAR | Status: AC
  Filled 2016-12-08: qty 2

## 2016-12-08 MED ORDER — MIDAZOLAM HCL 2 MG/2ML IJ SOLN
INTRAMUSCULAR | Status: DC | PRN
  Administered 2016-12-08: 2 mg via INTRAVENOUS

## 2016-12-08 MED ORDER — ONDANSETRON HCL 4 MG/2ML IJ SOLN
4.0000 mg | Freq: Once | INTRAMUSCULAR | Status: AC | PRN
  Administered 2016-12-08: 4 mg via INTRAVENOUS

## 2016-12-08 MED ORDER — HYDROMORPHONE HCL 1 MG/ML IJ SOLN
1.0000 mg | INTRAMUSCULAR | Status: DC | PRN
  Administered 2016-12-08 (×4): 1 mg via INTRAVENOUS
  Filled 2016-12-08 (×4): qty 1

## 2016-12-08 MED ORDER — ONDANSETRON HCL 4 MG/2ML IJ SOLN
INTRAMUSCULAR | Status: AC
  Filled 2016-12-08: qty 2

## 2016-12-08 MED ORDER — ONDANSETRON HCL 4 MG/2ML IJ SOLN
INTRAMUSCULAR | Status: DC | PRN
  Administered 2016-12-08: 4 mg via INTRAVENOUS

## 2016-12-08 MED ORDER — SUGAMMADEX SODIUM 500 MG/5ML IV SOLN
INTRAVENOUS | Status: DC | PRN
  Administered 2016-12-08: 400 mg via INTRAVENOUS

## 2016-12-08 MED ORDER — PIPERACILLIN-TAZOBACTAM 3.375 G IVPB
3.3750 g | Freq: Three times a day (TID) | INTRAVENOUS | Status: DC
  Administered 2016-12-08 – 2016-12-09 (×4): 3.375 g via INTRAVENOUS
  Filled 2016-12-08 (×6): qty 50

## 2016-12-08 MED ORDER — FENTANYL CITRATE (PF) 100 MCG/2ML IJ SOLN
INTRAMUSCULAR | Status: DC | PRN
  Administered 2016-12-08 (×2): 50 ug via INTRAVENOUS

## 2016-12-08 MED ORDER — DEXAMETHASONE SODIUM PHOSPHATE 10 MG/ML IJ SOLN
INTRAMUSCULAR | Status: DC | PRN
  Administered 2016-12-08: 10 mg via INTRAVENOUS

## 2016-12-08 MED ORDER — CHLORHEXIDINE GLUCONATE CLOTH 2 % EX PADS
6.0000 | MEDICATED_PAD | Freq: Once | CUTANEOUS | Status: AC
  Administered 2016-12-08: 6 via TOPICAL

## 2016-12-08 MED ORDER — OXYCODONE-ACETAMINOPHEN 5-325 MG PO TABS: 1.0000 | ORAL_TABLET | ORAL | Status: DC | PRN

## 2016-12-08 MED ORDER — KETOROLAC TROMETHAMINE 30 MG/ML IJ SOLN
30.0000 mg | Freq: Four times a day (QID) | INTRAMUSCULAR | Status: DC
  Administered 2016-12-08 – 2016-12-09 (×3): 30 mg via INTRAVENOUS
  Filled 2016-12-08 (×3): qty 1

## 2016-12-08 MED ORDER — FLUCONAZOLE 100MG IVPB
100.0000 mg | INTRAVENOUS | Status: DC
  Administered 2016-12-08 – 2016-12-09 (×2): 100 mg via INTRAVENOUS
  Filled 2016-12-08 (×2): qty 50

## 2016-12-08 MED ORDER — HYDRALAZINE HCL 20 MG/ML IJ SOLN: 10.0000 mg | INTRAMUSCULAR | Status: DC | PRN

## 2016-12-08 MED ORDER — FENTANYL CITRATE (PF) 100 MCG/2ML IJ SOLN
25.0000 ug | INTRAMUSCULAR | Status: AC | PRN
  Administered 2016-12-08 (×6): 25 ug via INTRAVENOUS

## 2016-12-08 MED ORDER — ONDANSETRON HCL 4 MG/2ML IJ SOLN
4.0000 mg | Freq: Four times a day (QID) | INTRAMUSCULAR | Status: DC | PRN
  Administered 2016-12-08 (×3): 4 mg via INTRAVENOUS
  Filled 2016-12-08 (×4): qty 2

## 2016-12-08 MED ORDER — GLYCOPYRROLATE 0.2 MG/ML IJ SOLN
INTRAMUSCULAR | Status: AC
  Filled 2016-12-08: qty 1

## 2016-12-08 MED ORDER — DEXAMETHASONE SODIUM PHOSPHATE 10 MG/ML IJ SOLN
INTRAMUSCULAR | Status: AC
  Filled 2016-12-08: qty 1

## 2016-12-08 MED ORDER — DIPHENHYDRAMINE HCL 50 MG/ML IJ SOLN
25.0000 mg | Freq: Four times a day (QID) | INTRAMUSCULAR | Status: DC | PRN
  Administered 2016-12-08 (×2): 25 mg via INTRAVENOUS
  Filled 2016-12-08 (×2): qty 1

## 2016-12-08 MED ORDER — ACETAMINOPHEN 10 MG/ML IV SOLN
INTRAVENOUS | Status: DC | PRN
  Administered 2016-12-08: 1000 mg via INTRAVENOUS

## 2016-12-08 MED ORDER — ONDANSETRON 4 MG PO TBDP: 4.0000 mg | ORAL_TABLET | Freq: Four times a day (QID) | ORAL | Status: DC | PRN

## 2016-12-08 MED ORDER — BUPIVACAINE-EPINEPHRINE 0.25% -1:200000 IJ SOLN
INTRAMUSCULAR | Status: DC | PRN
  Administered 2016-12-08: 30 mL

## 2016-12-08 MED ORDER — KETOROLAC TROMETHAMINE 30 MG/ML IJ SOLN
INTRAMUSCULAR | Status: DC | PRN
  Administered 2016-12-08: 30 mg via INTRAVENOUS

## 2016-12-08 SURGICAL SUPPLY — 36 items
APPLIER CLIP 5 13 M/L LIGAMAX5 (MISCELLANEOUS) ×3
BLADE CLIPPER SURG (BLADE) ×3 IMPLANT
CANISTER SUCT 1200ML W/VALVE (MISCELLANEOUS) ×3 IMPLANT
CHLORAPREP W/TINT 26ML (MISCELLANEOUS) ×3 IMPLANT
CLIP APPLIE 5 13 M/L LIGAMAX5 (MISCELLANEOUS) ×1 IMPLANT
CUTTER FLEX LINEAR 45M (STAPLE) ×3 IMPLANT
DERMABOND ADVANCED (GAUZE/BANDAGES/DRESSINGS) ×2
DERMABOND ADVANCED .7 DNX12 (GAUZE/BANDAGES/DRESSINGS) ×1 IMPLANT
ELECT CAUTERY BLADE 6.4 (BLADE) ×3 IMPLANT
ELECT REM PT RETURN 9FT ADLT (ELECTROSURGICAL) ×3
ELECTRODE REM PT RTRN 9FT ADLT (ELECTROSURGICAL) ×1 IMPLANT
ENDOPOUCH RETRIEVER 10 (MISCELLANEOUS) ×3 IMPLANT
GLOVE BIO SURGEON STRL SZ7 (GLOVE) ×3 IMPLANT
GOWN STRL REUS W/ TWL LRG LVL3 (GOWN DISPOSABLE) ×2 IMPLANT
GOWN STRL REUS W/TWL LRG LVL3 (GOWN DISPOSABLE) ×4
IRRIGATION STRYKERFLOW (MISCELLANEOUS) ×1 IMPLANT
IRRIGATOR STRYKERFLOW (MISCELLANEOUS) ×3
IV NS 1000ML (IV SOLUTION) ×2
IV NS 1000ML BAXH (IV SOLUTION) ×1 IMPLANT
NEEDLE HYPO 22GX1.5 SAFETY (NEEDLE) ×3 IMPLANT
NS IRRIG 500ML POUR BTL (IV SOLUTION) ×3 IMPLANT
PACK LAP CHOLECYSTECTOMY (MISCELLANEOUS) ×3 IMPLANT
PENCIL ELECTRO HAND CTR (MISCELLANEOUS) ×3 IMPLANT
RELOAD 45 VASCULAR/THIN (ENDOMECHANICALS) ×3 IMPLANT
RELOAD STAPLE TA45 3.5 REG BLU (ENDOMECHANICALS) ×3 IMPLANT
SCALPEL HARMONIC ACE (MISCELLANEOUS) ×3 IMPLANT
SCISSORS METZENBAUM CVD 33 (INSTRUMENTS) ×3 IMPLANT
SLEEVE ENDOPATH XCEL 5M (ENDOMECHANICALS) ×3 IMPLANT
SPONGE LAP 18X18 5 PK (GAUZE/BANDAGES/DRESSINGS) ×3 IMPLANT
SUT MNCRL AB 4-0 PS2 18 (SUTURE) ×3 IMPLANT
SUT VICRYL 0 AB UR-6 (SUTURE) ×6 IMPLANT
SYR 20CC LL (SYRINGE) ×3 IMPLANT
TRAY FOLEY W/METER SILVER 16FR (SET/KITS/TRAYS/PACK) ×3 IMPLANT
TROCAR XCEL BLUNT TIP 100MML (ENDOMECHANICALS) ×3 IMPLANT
TROCAR XCEL NON-BLD 5MMX100MML (ENDOMECHANICALS) ×6 IMPLANT
TUBING INSUF HEATED (TUBING) ×3 IMPLANT

## 2016-12-08 NOTE — Op Note (Signed)
laparascopic appendectomy   Doris Regulusngela Eastwood Penrose Date of operation:  12/08/2016  Indications: The patient presented with a history of  abdominal pain. Workup has revealed findings consistent with acute appendicitis.  Pre-operative Diagnosis: Acute appendicitis without mention of peritonitis  Post-operative Diagnosis: Same  Surgeon: Sterling Bigiego Amylah Will, MD, FACS  Anesthesia: General with endotracheal tube  Findings: Non perforated appendicitis  Estimated Blood Loss: 5cc         Specimens: appendix         Complications:  none  Procedure Details  The patient was seen again in the preop area. The options of surgery versus observation were reviewed with the patient and/or family. The risks of bleeding, infection, recurrence of symptoms, negative laparoscopy, potential for an open procedure, bowel injury, abscess or infection, were all reviewed as well. The patient was taken to Operating Room, identified as [name] and the procedure verified as laparoscopic appendectomy. A Time Out was held and the above information confirmed.  The patient was placed in the supine position and general anesthesia was induced.  Antibiotic prophylaxis was administered and VT E prophylaxis was in place. A Foley catheter was placed by the nursing staff.   The abdomen was prepped and draped in a sterile fashion. An infraumbilical incision was made. A cutdown technique was used to enter the abdominal cavity. Two vicryl stitches were placed on the fascia and a Hasson trocar inserted. Pneumoperitoneum obtained. Two 5 mm ports were placed under direct visualization.   The appendix was identified and found to be acutely inflamed  The appendix was carefully dissected. The mesoappendix was divided withHarmonic scalpel. The base of the appendix was dissected out and divided with a standard load Endo GIA.The appendix was placed in a Endo Catch bag and removed via the Hasson port. The right lower quadrant and  pelvis was then irrigated with  normal saline which was aspirated. Inspection  failed to identify any additional bleeding and there were no signs of bowel injury. Again the right lower quadrant was inspected there was no sign of bleeding or bowel injury therefore pneumoperitoneum was released, all ports were removed.  The umbilical fascia was closed with 0 Vicryl interrupted sutures and the skin incisions were approximated with subcuticular 4-0 Monocryl. Dermabond was placed The patient tolerated the procedure well, there were no complications. The sponge lap and needle count were correct at the end of the procedure.  The patient was taken to the recovery room in stable condition to be admitted for continued care.    Sterling Bigiego Kouper Spinella, MD FACS

## 2016-12-08 NOTE — Care Management Obs Status (Signed)
MEDICARE OBSERVATION STATUS NOTIFICATION   Patient Details  Name: [name] MRN: [id number] Date of Birth: [birth year]   Medicare Observation Status Notification Given:  No [facility]([place] letter)  Not given per no medicare benefits.     Desmon Hitchner A, RN 12/08/2016, 12:44 PM

## 2016-12-08 NOTE — Progress Notes (Signed)
Pt BP low this AM 88/49, MD notified. Will continue to monitor.

## 2016-12-08 NOTE — Transfer of Care (Signed)
Immediate Anesthesia Transfer of Care Note  Patient: [name]  Procedure(s) Performed: Procedure(s): APPENDECTOMY LAPAROSCOPIC (N/A)  Patient Location: PACU  Anesthesia Type:General  Level of Consciousness: sedated  Airway & Oxygen Therapy: Patient Spontanous Breathing and Patient connected to face mask oxygen  Post-op Assessment: Report given to RN and Post -op Vital signs reviewed and stable  Post vital signs: Reviewed and stable  Last Vitals:  Vitals:   12/08/16 0754 12/08/16 1354  BP: (!) 90/52 111/67  Pulse: (!) 52 73  Resp:  10  Temp:  (!) 36.2 C    Complications: No apparent anesthesia complications

## 2016-12-08 NOTE — Progress Notes (Signed)
41 year old female admitted with appendicitis. She still has persistent pain. CT scan reviewed consistent with appendicitis. Patient wishes to proceed with appendectomy. Medical Rx has not improved her sxs and surgery is indicated at this time. The risks, benefits, complications, treatment options, and expected outcomes were discussed with the patient.  Also discussed continuing to the operating room for Laparoscopic Appendectomy.  The possibilities of  bleeding, recurrent infection, perforation of viscus, finding a normal appendix, the need for additional procedures, failure to diagnose a condition, conversion to open procedure and creating a complication requiring transfusion or further operations were discussed. The patient was given the opportunity to ask questions and have them answered.  Patient would like to proceed with Laparoscopic Appendectomy and consent was obtained.

## 2016-12-08 NOTE — H&P (Signed)
Patient ID: [name], female   DOB: Nov 23, 1975, 41 y.o.   MRN: [id number]  CC: Abdominal pain  HPI [name] is a 41 y.o. female who presents emergency department today for evaluation of abdominal pain. Patient reports for the last 2 days she's had midepigastric abdominal pain that has radiated to her right upper quadrant and right flank. She's also had frequent urination and dysuria. She's had subjective fevers but she has not taken her temperature. She's had nausea without vomiting. She denies any chills, chest pain, shortness of breath. She had a larger than normal bowel movement earlier today but denies any diarrhea or constipation. The pain is described as coming in waves and being throbbing in nature. Nothing has made it better or worse. She's never had pain like this before. She denies any recent sick contacts or any other changes in her medical history.  HPI  Past Medical History:  Diagnosis Date  . History of frequent urinary tract infections   . Hypoglycemia   . IBS (irritable bowel syndrome)     Past Surgical History:  Procedure Laterality Date  . ABDOMINAL HYSTERECTOMY     PARTIAL  . BACK SURGERY    . CHOLECYSTECTOMY    . DIAGNOSTIC LAPAROSCOPY     test on bladder  . GALLBLADDER SURGERY    . MAXIMUM ACCESS (MAS)POSTERIOR LUMBAR INTERBODY FUSION (PLIF) 1 LEVEL N/A 05/06/2013   Procedure: LUMBAR FIVE-SACRAL ONE MAXIMUM ACCESS POSTERIOR LUMBAR INTERBODY FUSION;  Surgeon: Tia Alert, MD;  Location: [facility];  Service: Neurosurgery;  Laterality: N/A;  . TOE SURGERY    . TUBAL LIGATION    . WISDOM TOOTH EXTRACTION      Family History  Problem Relation Age of Onset  . Hypertension Mother   . COPD Mother   . Epilepsy Mother   . Hypertension Father     Social History Social History  Substance Use Topics  . Smoking status: Former Smoker    Packs/day: 0.50    Years: 20.00    Types: Cigarettes    Quit date: 02/26/2016  . Smokeless  tobacco: Never Used  . Alcohol use No    Allergies  Allergen Reactions  . Codeine Nausea And Vomiting  . Prednisolone     Other reaction(s): Dizziness, Irregular heart rate  . Sulfa Antibiotics Rash  . Vicodin [Hydrocodone-Acetaminophen] Nausea Only    And itching all over    No current facility-administered medications for this encounter.    Current Outpatient Prescriptions  Medication Sig Dispense Refill  . cephALEXin (KEFLEX) 500 MG capsule Take 1 capsule (500 mg total) by mouth 3 (three) times daily. 30 capsule 0  . nitroGLYCERIN (NITROSTAT) 0.4 MG SL tablet Place 0.4 mg under the tongue every 5 (five) minutes as needed for chest pain.    . traZODone (DESYREL) 50 MG tablet TAKE 1 TO 2 TABLETS BY MOUTH EVERY NIGHTAT BEDTIME AS NEEDED FOR INSOMNIA 60 tablet 5     Review of Systems A Multi-point review of systems was asked and was negative except for the findings documented in the history of present illness  Physical Exam Blood pressure 109/73, pulse (!) 57, temperature 99.8 F (37.7 C), temperature source Oral, resp. rate 16, height 5\' 6"  (1.676 m), weight 87.5 kg (193 lb), SpO2 99 %. CONSTITUTIONAL: No acute distress. EYES: Pupils are equal, round, and reactive to light, Sclera are non-icteric. EARS, NOSE, MOUTH AND THROAT: The oropharynx is clear. The oral mucosa is pink and moist. Hearing  is intact to voice. LYMPH NODES:  Lymph nodes in the neck are normal. RESPIRATORY:  Lungs are clear. There is normal respiratory effort, with equal breath sounds bilaterally, and without pathologic use of accessory muscles. CARDIOVASCULAR: Heart is regular without murmurs, gallops, or rubs. GI: The abdomen is soft, mildly tender to the midepigastric, right upper quadrant and right flank without rebound, guarding, McBurney sign., and nondistended. There are no palpable masses. There is no hepatosplenomegaly. There are normal bowel sounds in all quadrants. GU: Rectal deferred.    MUSCULOSKELETAL: Normal muscle strength and tone. No cyanosis or edema.   SKIN: Turgor is good and there are no pathologic skin lesions or ulcers. NEUROLOGIC: Motor and sensation is grossly normal. Cranial nerves are grossly intact. PSYCH:  Oriented to person, place and time. Affect is normal.  Data Reviewed Images and labs reviewed. All of her labs are within normal limits with the exception of bacteria seen on her urinalysis. Her white blood cell count, LFTs, electrolytes are within normal limits. CT scan of the abdomen shows a thickened, dilated appendix in the right lower quadrant consistent with acute appendicitis. There is no evidence of free air or abscess. I have personally reviewed the patient's imaging, laboratory findings and medical records.    Assessment    Appendicitis    Plan    41 year old female with multiple prior abdominal surgeries and acute appendicitis. May also be some dysuria from a urinary tract infection for her symptoms. Discussed the treatment options of appendicitis to include a laparoscopic appendectomy versus admission for IV antibiotics. The risks and benefits of both were described in detail. After long discussion the patient voiced preference to attempt IV antibiotic therapy without surgery at this point. She understands that if her symptoms do not resolve within 24-48 hours she will still be off her surgery. She also understands that should her symptoms worsen at any point she would again be offered surgery for removal of her appendix. She voiced understanding. Plan to bring in the hospital under observation, nothing by mouth, IV hydration, IV antibiotics, frequent abdominal exams.     Time spent with the patient was 50 minutes, with more than 50% of the time spent in face-to-face education, counseling and care coordination.     Doris Frameharles Merinda Victorino, MD FACS General Surgeon 12/08/2016, 1:10 AM

## 2016-12-08 NOTE — Anesthesia Post-op Follow-up Note (Cosign Needed)
Anesthesia QCDR form completed.        

## 2016-12-08 NOTE — Anesthesia Postprocedure Evaluation (Signed)
Anesthesia Post Note  Patient: [name]  Procedure(s) Performed: Procedure(s) (LRB): APPENDECTOMY LAPAROSCOPIC (N/A)  Patient location during evaluation: PACU Anesthesia Type: General Level of consciousness: awake and alert and oriented Pain management: pain level controlled Vital Signs Assessment: post-procedure vital signs reviewed and stable Respiratory status: spontaneous breathing Cardiovascular status: blood pressure returned to baseline Anesthetic complications: no     Last Vitals:  Vitals:   12/08/16 1737 12/08/16 2046  BP: (!) 111/43 113/65  Pulse: (!) 49 (!) 46  Resp: 14 20  Temp: 36.9 C 36.4 C    Last Pain:  Vitals:   12/08/16 2046  TempSrc: Oral  PainSc:                  Brendon Christoffel

## 2016-12-08 NOTE — Progress Notes (Signed)
Pt BP in the 80's/40's this AM. Pt requesting pain med for pain of 7/10. MD called for advise. Morphine 4mg  admin. On assessment Pt asymptomatic and noted that she was feeling ok and pain down to 5/10. Will continue to monitor.

## 2016-12-08 NOTE — Consult Note (Signed)
HCPOA/AD materials dropped off with patient.  CH available to review as needed. 

## 2016-12-08 NOTE — Anesthesia Preprocedure Evaluation (Addendum)
Anesthesia Evaluation  Patient identified by MRN, date of birth, ID band Patient awake    Reviewed: Allergy & Precautions, H&P , NPO status   Airway Mallampati: II  TM Distance: >3 FB Neck ROM: Full    Dental  (+) Teeth Intact, Dental Advisory Given, Chipped   Pulmonary Current Smoker, former smoker,    breath sounds clear to auscultation       Cardiovascular negative cardio ROS   Rhythm:Regular Rate:Normal     Neuro/Psych  Headaches, negative psych ROS   GI/Hepatic Neg liver ROS, IBS   Endo/Other  negative endocrine ROS  Renal/GU negative Renal ROS     Musculoskeletal negative musculoskeletal ROS (+)   Abdominal (+)  Abdomen: tender.    Peds negative pediatric ROS (+)  Hematology negative hematology ROS (+)   Anesthesia Other Findings Past Medical History: No date: History of frequent urinary tract infections No date: Hypoglycemia No date: IBS (irritable bowel syndrome)  Reproductive/Obstetrics                            Anesthesia Physical  Anesthesia Plan  ASA: II and emergent  Anesthesia Plan: General   Post-op Pain Management:    Induction: Intravenous  PONV Risk Score and Plan:   Airway Management Planned: Oral ETT  Additional Equipment:   Intra-op Plan:   Post-operative Plan: Extubation in OR  Informed Consent: I have reviewed the patients History and Physical, chart, labs and discussed the procedure including the risks, benefits and alternatives for the proposed anesthesia with the patient or authorized representative who has indicated his/her understanding and acceptance.   Dental advisory given  Plan Discussed with: CRNA and Anesthesiologist  Anesthesia Plan Comments: (HNP L5-S1 Smoker  Plan GA with oral ETT  Kipp Broodavid Joslin, MD)        Anesthesia Quick Evaluation

## 2016-12-08 NOTE — Anesthesia Procedure Notes (Signed)
Procedure Name: Intubation Date/Time: 12/08/2016 1:10 PM Performed by: Doreen Salvage Pre-anesthesia Checklist: Patient identified, Patient being monitored, Timeout performed, Emergency Drugs available and Suction available Patient Re-evaluated:Patient Re-evaluated prior to inductionOxygen Delivery Method: Circle system utilized Preoxygenation: Pre-oxygenation with 100% oxygen Intubation Type: IV induction Ventilation: Mask ventilation without difficulty Laryngoscope Size: Mac and 3 Grade View: Grade I Tube type: Oral Tube size: 7.0 mm Number of attempts: 1 Airway Equipment and Method: Stylet Placement Confirmation: ETT inserted through vocal cords under direct vision,  positive ETCO2 and breath sounds checked- equal and bilateral Secured at: 22 cm Tube secured with: Tape Dental Injury: Teeth and Oropharynx as per pre-operative assessment

## 2016-12-09 ENCOUNTER — Encounter: Payer: Self-pay | Admitting: Surgery

## 2016-12-09 ENCOUNTER — Telehealth: Payer: Self-pay

## 2016-12-09 LAB — URINE CULTURE

## 2016-12-09 LAB — HIV ANTIBODY (ROUTINE TESTING W REFLEX): HIV SCREEN 4TH GENERATION: NONREACTIVE

## 2016-12-09 MED ORDER — OXYCODONE-ACETAMINOPHEN 5-325 MG PO TABS: 1.0000 | ORAL_TABLET | ORAL | 0 refills | Status: DC | PRN

## 2016-12-09 NOTE — Telephone Encounter (Signed)
Post-op call made to patient at this time. Spoke with patient. Post-op interview questions below.  1. How are you feeling? Good  2. Is your pain controlled? yes  3. What are you doing for the pain? Taking her pain medications as needed.  4. Are you having any Nausea or Vomiting? no  5. Are you having any Fever or Chills? No  6. Are you having any Constipation or Diarrhea? No  7. Is there any Swelling or Bruising you are concerned about? No  8. Do you have any questions or concerns at this time? Not at this time. However, patient wanted a letter to return to work. This has been taken care of.

## 2016-12-09 NOTE — Telephone Encounter (Signed)
Called patient back and left her a voicemail to return my call. 

## 2016-12-09 NOTE — Progress Notes (Signed)
Discharge   Pt was able to participate with discharge teaching with family at the bedside. Pt was informed of follow-up appts and medications. PIV removed and pt has no complaints of pain at this time.

## 2016-12-09 NOTE — Telephone Encounter (Signed)
Patient had a Laparoscopic Appendectomy on 12/09/16 with Dr. Everlene FarrierPabon. She is wanting to know when she can return to work. She works in an office. She would also like to know if she can get a work note stating her return to work date and limitations. Please call patient and advice.

## 2016-12-09 NOTE — Care Management (Signed)
Patient admitted post lap appy.  Patient uninsured.  PCP Chauvin.  [facility] however she states that she prefers to continue going to her current MD.  Patient to discharge on PO keflex which is on the $4 list at [facility].  No Further needs identified.  RNCM signing off.

## 2016-12-11 LAB — SURGICAL PATHOLOGY

## 2016-12-16 ENCOUNTER — Ambulatory Visit (INDEPENDENT_AMBULATORY_CARE_PROVIDER_SITE_OTHER): Payer: Self-pay | Admitting: Family Medicine

## 2016-12-16 ENCOUNTER — Encounter: Payer: Self-pay | Admitting: Family Medicine

## 2016-12-16 VITALS — BP 122/82 | HR 83 | Temp 99.1°F | Resp 16 | Wt 193.8 lb

## 2016-12-16 DIAGNOSIS — R42 Dizziness and giddiness: Secondary | ICD-10-CM

## 2016-12-16 DIAGNOSIS — H019 Unspecified inflammation of eyelid: Secondary | ICD-10-CM

## 2016-12-16 DIAGNOSIS — L235 Allergic contact dermatitis due to other chemical products: Secondary | ICD-10-CM

## 2016-12-16 MED ORDER — TRIAMCINOLONE ACETONIDE 0.1 % EX CREA: 1.0000 "application " | TOPICAL_CREAM | Freq: Three times a day (TID) | CUTANEOUS | 1 refills | Status: DC

## 2016-12-16 MED ORDER — MUPIROCIN CALCIUM 2 % EX CREA: 1.0000 "application " | TOPICAL_CREAM | Freq: Three times a day (TID) | CUTANEOUS | 0 refills | Status: DC

## 2016-12-16 NOTE — Progress Notes (Signed)
Subjective:     Patient ID: [name], female   DOB: [birth year], 41 y.o.   MRN: [id number]  HPI  Chief Complaint  Patient presents with  . Belepharitis    Patient comes in office today with concerns of swelling to her left eyelid since 12/11/16. Patient reports eye is painful to the touch and she has had discharge and crusting of eye lid.   . Rash    Patient reports rash acroos her entire abdomen since 12/11/16, patient describes the reash as slightly itchy she denies that rash has spread anywhere else.   . Dizziness    Patient reports dizziness since 12/12/16,associated with dizziness patient complains of nausea and decreased appetite.   She had laparoscopic appendectomy on 7/8//18. She has 3 ports on her abdomen and completing a 10 day course of Keflex. Reports no changes in her vision or significant matting. Did see small "white bumps" on her left upper eyelid which have since resolved. Now has upper > lower left eyelid swelling. Accompanied by her daughter today.   Review of Systems     Objective:   Physical Exam  Constitutional: She appears well-developed and well-nourished. No distress.  HENT:  Left upper eyelid with moderate swelling (not impairing vision) and an irritated area on lateral aspect c/w involuted pustules/vesicles. Mild lower eyelid swelling. No chalazion or eyelash margin involvement. Sclera is not inflamed.  Pulmonary/Chest: Breath sounds normal.  Skin:  Abdomen with 1-2 mm. Discrete papules across lower abdomen. Lap ports with surgical glue intact without drainage. Involved are c/w topical application of peri-surgical betadine.       Assessment:    1. Allergic dermatitis due to other chemical product - triamcinolone cream (KENALOG) 0.1 %; Apply 1 application topically 3 (three) times daily. To abdominal rash  Dispense: 30 g; Refill: 1  2. Dizziness; ? Secondary to abx - CBC with Differential/Platelet - Renal function panel  3. Infection of eyelid:  cover for bacterial involvement; monitor for further development suggestive of shingles. - mupirocin cream (BACTROBAN) 2 %; Apply 1 application topically 3 (three) times daily. To upper eyelid.  Dispense: 15 g; Refill: 0    Plan:   Further f/u pending lab work.

## 2016-12-16 NOTE — Patient Instructions (Signed)
We will call you with the lab results. 

## 2016-12-17 ENCOUNTER — Telehealth: Payer: Self-pay | Admitting: Family Medicine

## 2016-12-17 ENCOUNTER — Telehealth: Payer: Self-pay

## 2016-12-17 LAB — RENAL FUNCTION PANEL
Albumin: 4.8 g/dL (ref 3.5–5.5)
BUN / CREAT RATIO: 14 (ref 9–23)
BUN: 11 mg/dL (ref 6–20)
CO2: 21 mmol/L (ref 20–29)
CREATININE: 0.81 mg/dL (ref 0.57–1.00)
Calcium: 9.9 mg/dL (ref 8.7–10.2)
Chloride: 103 mmol/L (ref 96–106)
GFR, EST AFRICAN AMERICAN: 106 mL/min/{1.73_m2} (ref >59)
GFR, EST NON AFRICAN AMERICAN: 92 mL/min/{1.73_m2} (ref >59)
Glucose: 78 mg/dL (ref 65–99)
Phosphorus: 3.7 mg/dL (ref 2.5–4.5)
Potassium: 5 mmol/L (ref 3.5–5.2)
SODIUM: 139 mmol/L (ref 134–144)

## 2016-12-17 LAB — CBC WITH DIFFERENTIAL/PLATELET
BASOS: 0 %
Basophils Absolute: 0 10*3/uL (ref 0.0–0.2)
EOS (ABSOLUTE): 0.3 10*3/uL (ref 0.0–0.4)
EOS: 3 %
HEMATOCRIT: 43.5 % (ref 34.0–46.6)
Hemoglobin: 14.2 g/dL (ref 11.1–15.9)
IMMATURE GRANULOCYTES: 0 %
Immature Grans (Abs): 0 10*3/uL (ref 0.0–0.1)
LYMPHS ABS: 2.3 10*3/uL (ref 0.7–3.1)
Lymphs: 24 %
MCH: 30.6 pg (ref 26.6–33.0)
MCHC: 32.6 g/dL (ref 31.5–35.7)
MCV: 94 fL (ref 79–97)
MONOS ABS: 0.4 10*3/uL (ref 0.1–0.9)
Monocytes: 4 %
NEUTROS ABS: 6.7 10*3/uL (ref 1.4–7.0)
NEUTROS PCT: 69 %
PLATELETS: 383 10*3/uL — AB (ref 150–379)
RBC: 4.64 x10E6/uL (ref 3.77–5.28)
RDW: 13.3 % (ref 12.3–15.4)
WBC: 9.8 10*3/uL (ref 3.4–10.8)

## 2016-12-17 NOTE — Telephone Encounter (Signed)
Discussed resuming normal eating pattern off abx and see if dizziness/anorexia improves. States abdominal rash and eyelid swelling starting to improve. If dizziness not improving over the next day or two have asked her to call her surgeon.

## 2016-12-17 NOTE — Telephone Encounter (Signed)
Patient has been advised. KW 

## 2016-12-17 NOTE — Telephone Encounter (Signed)
Pt is calling back about her lab results.  She was in earlier this week with Nadine CountsBob.  Her call back is [phone]  Thanks Barth Kirksteri

## 2016-12-17 NOTE — Telephone Encounter (Signed)
-----   Message from Anola Gurneyobert Chauvin, [place] sent at 12/17/2016 10:19 AM EDT ----- Labs ok-no anemia or white count elevation suggesting significant infection. Let's see how you feel when you finish the antibiotic.

## 2016-12-17 NOTE — Telephone Encounter (Signed)
Spoke with patient on the phone and she states that she d/c antibiotic yesterday. Patient states that she has been on medication since 12/08/16 and felt that it was contributing to her symptoms. Patient wants to know what she should do now? Please advise. KW

## 2016-12-21 NOTE — Discharge Summary (Signed)
Physician Discharge Summary  Patient ID: [name] MRN: [id number] DOB/AGE: [birth year] 39 y.o.  Admit date: 12/07/2016 Discharge date: 12/21/2016  Admission Diagnoses:  Discharge Diagnoses:  Active Problems:   Appendicitis   Discharged Condition: good  Hospital Course: 41 year old Female presented [facility] ED with abdominal pain x 2 days, along with subjective fevers and nausea. Workup was found to be significant for acute appendicitis on CT imaging and possible recurrent UTI. Patient initially requested non-operative management with antibiotic therapy, but subsequently elected to proceed with laparoscopic appendectomy when her pain did not resolve with antibiotics alone. Post-operatively, patient's pain was well-controlled, advancement of her diet was well-tolerated, and the remainder of her admission was unremarkable. Accordingly, discharge planning was initiated, and patient was safely able to be discharged home with appropriate discharge instructions, post-surgical follow-up, and pain medication.  Consults: None  Significant Diagnostic Studies: radiology: CT scan: acute appendicitis  Treatments: surgery: laparoscopic appendectomy  Discharge Exam: Blood pressure 126/68, pulse (!) 51, temperature 98.5 F (36.9 C), temperature source Oral, resp. rate 19, height 5' 6.5" (1.689 m), weight 193 lb 12.8 oz (87.9 kg), SpO2 100 %. General appearance: alert, cooperative and no distress GI: soft and non-distended with mild peri-incisional tenderness to palpation, incisions well-approximated without erythema or drainage  Disposition: 01-Home or Self Care   Allergies as of 12/09/2016      Reactions   Codeine Nausea And Vomiting   Prednisolone    Other reaction(s): Dizziness, Irregular heart rate   Sulfa Antibiotics Rash   Vicodin [hydrocodone-acetaminophen] Nausea Only   And itching all over      Medication List    TAKE these medications   cephALEXin 500 MG  capsule Commonly known as:  KEFLEX Take 1 capsule (500 mg total) by mouth 3 (three) times daily.   nitroGLYCERIN 0.4 MG SL tablet Commonly known as:  NITROSTAT Place 0.4 mg under the tongue every 5 (five) minutes as needed for chest pain.   traZODone 50 MG tablet Commonly known as:  DESYREL TAKE 1 TO 2 TABLETS BY MOUTH EVERY NIGHTAT BEDTIME AS NEEDED FOR INSOMNIA      Follow-up Information    Anola GurneyChauvin, Robert, [place]. Go on 12/16/2016.   Specialty:  Family Medicine Why:  Monday at 10:00am for hospital follow-up Contact information: [address] [place] [address] [phone]        Leafy RoPabon, Diego F, MD On 12/25/2016.   Specialty:  General Surgery Why:  Wednesday at 1:15pm for hospital follow-up Contact information: [address] [place] [address] [phone]           Signed: Ancil LinseyJason Evan Davis 12/21/2016, 1:01 PM

## 2016-12-25 ENCOUNTER — Ambulatory Visit (INDEPENDENT_AMBULATORY_CARE_PROVIDER_SITE_OTHER): Payer: Self-pay | Admitting: Surgery

## 2016-12-25 ENCOUNTER — Encounter: Payer: Self-pay | Admitting: Surgery

## 2016-12-25 VITALS — BP 126/84 | HR 86 | Temp 99.8°F | Ht 66.0 in | Wt 190.4 lb

## 2016-12-25 DIAGNOSIS — Z09 Encounter for follow-up examination after completed treatment for conditions other than malignant neoplasm: Secondary | ICD-10-CM

## 2016-12-25 NOTE — Progress Notes (Signed)
S/p lap appy Doing well No complaints + PO Path d/w pt  PE NAD Abd: soft, NT incisions c/d/i,   A/P Doing well No Heavy lifting RTC prn

## 2016-12-25 NOTE — Patient Instructions (Signed)
Please do not submerge in a tub, hot tub, or pool until incisions are completely sealed.  Use sun block to incision area over the next year if this area will be exposed to sun. This helps decrease scarring.  You may resume your normal activities on 01/19/17. At that time- Listen to your body when lifting, if you have pain when lifting, stop and then try again in a few days. Soreness after doing exercises or activities of daily living is normal as you get back in to your normal routine.  If you develop redness, drainage, or pain at incision sites- call our office immediately and speak with a nurse.  Please call our office with any questions or concerns that you may have 

## 2017-01-13 ENCOUNTER — Emergency Department
Admission: EM | Admit: 2017-01-13 | Discharge: 2017-01-14 | Disposition: A | Discharge status: Home / Self Care | Payer: Self-pay | Attending: Emergency Medicine | Admitting: Emergency Medicine

## 2017-01-13 ENCOUNTER — Emergency Department: Payer: Self-pay

## 2017-01-13 ENCOUNTER — Encounter: Payer: Self-pay | Admitting: *Deleted

## 2017-01-13 DIAGNOSIS — K5901 Slow transit constipation: Secondary | ICD-10-CM | POA: Insufficient documentation

## 2017-01-13 DIAGNOSIS — F17210 Nicotine dependence, cigarettes, uncomplicated: Secondary | ICD-10-CM | POA: Insufficient documentation

## 2017-01-13 DIAGNOSIS — Z79899 Other long term (current) drug therapy: Secondary | ICD-10-CM | POA: Insufficient documentation

## 2017-01-13 LAB — CBC
HCT: 42.4 % (ref 35.0–47.0)
HEMOGLOBIN: 14.2 g/dL (ref 12.0–16.0)
MCH: 29.7 pg (ref 26.0–34.0)
MCHC: 33.6 g/dL (ref 32.0–36.0)
MCV: 88.5 fL (ref 80.0–100.0)
PLATELETS: 257 10*3/uL (ref 150–440)
RBC: 4.79 MIL/uL (ref 3.80–5.20)
RDW: 12.8 % (ref 11.5–14.5)
WBC: 7.4 10*3/uL (ref 3.6–11.0)

## 2017-01-13 LAB — COMPREHENSIVE METABOLIC PANEL
ALBUMIN: 4.7 g/dL (ref 3.5–5.0)
ALK PHOS: 72 U/L (ref 38–126)
ALT: 17 U/L (ref 14–54)
ANION GAP: 7 (ref 5–15)
AST: 20 U/L (ref 15–41)
BUN: 13 mg/dL (ref 6–20)
CALCIUM: 9.5 mg/dL (ref 8.9–10.3)
CO2: 25 mmol/L (ref 22–32)
Chloride: 106 mmol/L (ref 101–111)
Creatinine, Ser: 0.81 mg/dL (ref 0.44–1.00)
GFR calc non Af Amer: >60 mL/min (ref >60)
Glucose, Bld: 92 mg/dL (ref 65–99)
Potassium: 4 mmol/L (ref 3.5–5.1)
Sodium: 138 mmol/L (ref 135–145)
TOTAL PROTEIN: 7.4 g/dL (ref 6.5–8.1)
Total Bilirubin: 0.7 mg/dL (ref 0.3–1.2)

## 2017-01-13 LAB — URINALYSIS, COMPLETE (UACMP) WITH MICROSCOPIC
BACTERIA UA: NONE SEEN
Bilirubin Urine: NEGATIVE
Glucose, UA: NEGATIVE mg/dL
Ketones, ur: NEGATIVE mg/dL
LEUKOCYTES UA: NEGATIVE
Nitrite: NEGATIVE
PH: 6 (ref 5.0–8.0)
Protein, ur: NEGATIVE mg/dL
SPECIFIC GRAVITY, URINE: 1.001 — AB (ref 1.005–1.030)
WBC, UA: NONE SEEN WBC/hpf (ref 0–5)

## 2017-01-13 NOTE — ED Triage Notes (Addendum)
Pt to triage via wheelchair.  Pt had appendectomy 4 weeks ago at [facility].  Today, pt has increased pain in right side of abd .  No n/v/d.  Pt alert.  Speech clear.  Pt states no bm for 2 weeks.

## 2017-01-13 NOTE — ED Provider Notes (Signed)
[facility] Emergency Department Provider Note    First MD Initiated Contact with Patient 01/13/17 2310     (approximate)  I have reviewed the triage vital signs and the nursing notes.   HISTORY  Chief Complaint Abdominal Pain and Constipation    HPI [name] is a 41 y.o. female with below list of chronic medical conditions including recent appendectomy on 12/09/18 performed by Dr. Everlene Farrier return to emergency department with increasing right lower quadrant abdominal discomfort today accompanied by nausea. Patient admits to constipation 2 weeks as well. Patient denies any fever afebrile on presentation temperature 98.7. Patient denies any urinary sms.   Past Medical History:  Diagnosis Date  . History of frequent urinary tract infections   . Hypoglycemia   . IBS (irritable bowel syndrome)     Patient Active Problem List   Diagnosis Date Noted  . Appendicitis 12/08/2016  . Chest pain, unspecified 11/23/2015  . Abnormal EKG 11/15/2015  . Dizziness 11/15/2015  . Intermittent palpitations 11/15/2015  . Insomnia 12/30/2014  . S/P lumbar spinal fusion 05/06/2013  . Compulsive tobacco user syndrome 03/28/2007  . Headache, migraine 03/28/2007    Past Surgical History:  Procedure Laterality Date  . ABDOMINAL HYSTERECTOMY     PARTIAL  . BACK SURGERY    . CHOLECYSTECTOMY    . DIAGNOSTIC LAPAROSCOPY     test on bladder  . GALLBLADDER SURGERY    . LAPAROSCOPIC APPENDECTOMY N/A 12/08/2016   Procedure: APPENDECTOMY LAPAROSCOPIC;  Surgeon: Leafy Ro, MD;  Location: [facility] ORS;  Service: General;  Laterality: N/A;  . MAXIMUM ACCESS (MAS)POSTERIOR LUMBAR INTERBODY FUSION (PLIF) 1 LEVEL N/A 05/06/2013   Procedure: LUMBAR FIVE-SACRAL ONE MAXIMUM ACCESS POSTERIOR LUMBAR INTERBODY FUSION;  Surgeon: Tia Alert, MD;  Location: [facility];  Service: Neurosurgery;  Laterality: N/A;  . TOE SURGERY    . TUBAL LIGATION    . WISDOM TOOTH EXTRACTION       Prior to Admission medications   Medication Sig Start Date End Date Taking? Authorizing Provider  cephALEXin (KEFLEX) 500 MG capsule Take 1 capsule (500 mg total) by mouth 3 (three) times daily. 12/07/16   Minna Antis, MD  mupirocin cream (BACTROBAN) 2 % Apply 1 application topically 3 (three) times daily. To upper eyelid. 12/16/16   Anola Gurney, PA  nitroGLYCERIN (NITROSTAT) 0.4 MG SL tablet Place 0.4 mg under the tongue every 5 (five) minutes as needed for chest pain.    [provider]  polyethylene glycol (MIRALAX) packet Take 17 g by mouth daily as needed. 01/14/17   Darci Current, MD  traZODone (DESYREL) 50 MG tablet TAKE 1 TO 2 TABLETS BY MOUTH EVERY NIGHTAT BEDTIME AS NEEDED FOR INSOMNIA 08/07/16   Anola Gurney, PA  triamcinolone cream (KENALOG) 0.1 % Apply 1 application topically 3 (three) times daily. To abdominal rash 12/16/16   Anola Gurney, PA    Allergies Codeine; Prednisolone; Sulfa antibiotics; and Vicodin [hydrocodone-acetaminophen]  Family History  Problem Relation Age of Onset  . Hypertension Mother   . COPD Mother   . Epilepsy Mother   . Hypertension Father     Social History Social History  Substance Use Topics  . Smoking status: Current Every Day Smoker    Packs/day: 0.50    Years: 20.00    Types: Cigarettes    Last attempt to quit: 02/26/2016  . Smokeless tobacco: Never Used  . Alcohol use No    Review of Systems Constitutional: No fever/chills Eyes: No  visual changes. ENT: No sore throat. Cardiovascular: Denies chest pain. Respiratory: Denies shortness of breath. Gastrointestinal:positive for abdominal pain and nausea, no vomiting.  No diarrhea.  Positive for constipation Genitourinary: Negative for dysuria. Musculoskeletal: Negative for neck pain.  Negative for back pain. Integumentary: Negative for rash. Neurological: Negative for headaches, focal weakness or  numbness.   ____________________________________________   PHYSICAL EXAM:  VITAL SIGNS: ED Triage Vitals [01/13/17 2121]  Enc Vitals Group     BP 125/78     Pulse Rate 66     Resp 20     Temp 98.7 F (37.1 C)     Temp Source Oral     SpO2 99 %     Weight 83.5 kg (184 lb)     Height 1.676 m (5\' 6" )     Head Circumference      Peak Flow      Pain Score 5     Pain Loc      Pain Edu?      Excl. in GC?     Constitutional: Alert and oriented. Well appearing and in no acute distress. Eyes: Conjunctivae are normal.  Head: Atraumatic. Mouth/Throat: Mucous membranes are moist. Oropharynx non-erythematous. Neck: No stridor.  Cardiovascular: Normal rate, regular rhythm. Good peripheral circulation. Grossly normal heart sounds. Respiratory: Normal respiratory effort.  No retractions. Lungs CTAB. Gastrointestinal: right lower quadrant abdominal tenderness to palpation. No distention.  Musculoskeletal: No lower extremity tenderness nor edema. No gross deformities of extremities. Neurologic:  Normal speech and language. No gross focal neurologic deficits are appreciated.  Skin:  Skin is warm, dry and intact. No rash noted. Psychiatric: Mood and affect are normal. Speech and behavior are normal.  ____________________________________________   LABS (all labs ordered are listed, but only abnormal results are displayed)  Labs Reviewed  URINALYSIS, COMPLETE (UACMP) WITH MICROSCOPIC - Abnormal; Notable for the following:       Result Value   Color, Urine COLORLESS (*)    APPearance CLEAR (*)    Specific Gravity, Urine 1.001 (*)    Hgb urine dipstick SMALL (*)    Squamous Epithelial / LPF 0-5 (*)    All other components within normal limits  COMPREHENSIVE METABOLIC PANEL  CBC    RADIOLOGY I, [place] N BROWN, personally viewed and evaluated these images (plain radiographs) as part of my medical decision making, as well as reviewing the written report by the radiologist.  Dg  Abdomen 1 View  Result Date: 01/14/2017 CLINICAL DATA:  Acute onset of sharp lower abdominal pain and nausea. Initial encounter. EXAM: ABDOMEN - 1 VIEW COMPARISON:  CT of the abdomen and pelvis from 12/07/2016 FINDINGS: The visualized bowel gas pattern is unremarkable. Scattered air and stool filled loops of colon are seen; no abnormal dilatation of small bowel loops is seen to suggest small bowel obstruction. No free intra-abdominal air is identified, though evaluation for free air is limited on a single supine view. The patient is status post lumbosacral spinal fusion at L5-S1. The sacroiliac joints are unremarkable in appearance. IMPRESSION: Unremarkable bowel gas pattern; no free intra-abdominal air seen. Small amount of stool noted in the colon. Electronically Signed   By: Roanna Raider M.D.   On: 01/14/2017 00:35   Ct Abdomen Pelvis W Contrast  Result Date: 01/14/2017 CLINICAL DATA:  Colicky right lower quadrant abdominal pain. Appendectomy 4 weeks prior. Patient reports no bowel movement for 2 weeks. EXAM: CT ABDOMEN AND PELVIS WITH CONTRAST TECHNIQUE: Multidetector CT imaging of the abdomen and  pelvis was performed using the standard protocol following bolus administration of intravenous contrast. CONTRAST:  100mL ISOVUE-300 IOPAMIDOL (ISOVUE-300) INJECTION 61% COMPARISON:  CT 12/07/2016 FINDINGS: Lower chest: The lung bases are clear. Hepatobiliary: Tiny low-density hepatic lesions are unchanged from prior exam. No new hepatic lesion. Clips in the gallbladder fossa postcholecystectomy. No biliary dilatation. Pancreas: No ductal dilatation or inflammation. Spleen: Normal in size without focal abnormality. Adrenals/Urinary Tract: Normal adrenal glands. No hydronephrosis or perinephric edema. Homogeneous enhancement with symmetric excretion on delayed phase imaging. Tiny low-density lesion in the mid left kidney is too small to accurately characterize. Urinary bladder is physiologically distended.  Stomach/Bowel: Stomach is physiologically distended. Fluid-filled nondilated small bowel. Post appendectomy. Fluid-filled cecum, ascending, and proximal transverse colon. Moderate stool in the more distal colon. No evidence of bowel wall thickening or inflammation. Vascular/Lymphatic: No significant vascular findings are present. No enlarged abdominal or pelvic lymph nodes. Reproductive: Status post hysterectomy. No adnexal masses. Other: No intra-abdominal abscess, particularly in the right lower quadrant. No evidence of fat necrosis. No ascites or free air. No subcutaneous fluid collection in the abdominal wall. Musculoskeletal: Posterior L5-S1 fusion with interbody spacer. There are no acute or suspicious osseous abnormalities. IMPRESSION: 1. Post appendectomy without intra-abdominal abscess. 2. Constipation/slow bowel transit pattern with moderate colonic stool burden and fluid-filled proximal bowel. No bowel inflammation or obstruction. Electronically Signed   By: Rubye OaksMelanie  Ehinger M.D.   On: 01/14/2017 01:40     Procedures   ____________________________________________   INITIAL IMPRESSION / ASSESSMENT AND PLAN / ED COURSE  Pertinent labs & imaging results that were available during my care of the patient were reviewed by me and considered in my medical decision making (see chart for details).  Patient given an enema in the emergency department with some results patient was also given senna plus. Patient however continued to admit to right lower quadrant abdominal pain and a such CT scan of the abdomen was performed which revealed above-stated findings of constipation      ____________________________________________  FINAL CLINICAL IMPRESSION(S) / ED DIAGNOSES  Final diagnoses:  Slow transit constipation     MEDICATIONS GIVEN DURING THIS VISIT:  Medications  ondansetron (ZOFRAN) 4 MG/2ML injection (  Not Given 01/14/17 0145)  senna-docusate (Senokot-S) tablet 2 tablet (not  administered)  docusate (COLACE) 50 MG/5ML liquid 100 mg (100 mg Oral Given 01/14/17 0059)  mineral oil enema 1 enema (1 enema Rectal Given 01/14/17 0012)  magnesium citrate solution 1 Bottle (1 Bottle Oral Given 01/14/17 0012)  senna-docusate (Senokot-S) tablet 2 tablet (2 tablets Oral Given 01/14/17 0012)  iopamidol (ISOVUE-300) 61 % injection 100 mL (100 mLs Intravenous Contrast Given 01/14/17 0117)  ondansetron (ZOFRAN) injection 4 mg (4 mg Intravenous Given 01/14/17 0145)     NEW OUTPATIENT MEDICATIONS STARTED DURING THIS VISIT:  Current Discharge Medication List    START taking these medications   Details  polyethylene glycol (MIRALAX) packet Take 17 g by mouth daily as needed. Qty: 14 each, Refills: 0        Current Discharge Medication List      Current Discharge Medication List       Note:  This document was prepared using Dragon voice recognition software and may include unintentional dictation errors.    Darci CurrentBrown, Pinole N, MD 01/14/17 [phone]

## 2017-01-13 NOTE — ED Notes (Signed)
Dr Brown and Butch, RN at bedside at this time. 

## 2017-01-14 ENCOUNTER — Emergency Department: Payer: Self-pay

## 2017-01-14 ENCOUNTER — Encounter: Payer: Self-pay | Admitting: Radiology

## 2017-01-14 MED ORDER — MAGNESIUM CITRATE PO SOLN
1.0000 | Freq: Once | ORAL | Status: AC
  Administered 2017-01-14: 1 via ORAL
  Filled 2017-01-14: qty 296

## 2017-01-14 MED ORDER — SENNOSIDES-DOCUSATE SODIUM 8.6-50 MG PO TABS
2.0000 | ORAL_TABLET | Freq: Once | ORAL | Status: AC
  Administered 2017-01-14: 2 via ORAL
  Filled 2017-01-14: qty 2

## 2017-01-14 MED ORDER — POLYETHYLENE GLYCOL 3350 17 G PO PACK: 17.0000 g | PACK | Freq: Every day | ORAL | 0 refills | Status: DC

## 2017-01-14 MED ORDER — DOCUSATE SODIUM 50 MG/5ML PO LIQD
100.0000 mg | Freq: Once | ORAL | Status: AC
  Administered 2017-01-14: 100 mg via ORAL
  Filled 2017-01-14: qty 10

## 2017-01-14 MED ORDER — POLYETHYLENE GLYCOL 3350 17 G PO PACK: 17.0000 g | PACK | Freq: Every day | ORAL | 0 refills | Status: DC | PRN

## 2017-01-14 MED ORDER — MINERAL OIL RE ENEM
1.0000 | ENEMA | Freq: Once | RECTAL | Status: AC
  Administered 2017-01-14: 1 via RECTAL

## 2017-01-14 MED ORDER — IOPAMIDOL (ISOVUE-300) INJECTION 61%
100.0000 mL | Freq: Once | INTRAVENOUS | Status: AC | PRN
  Administered 2017-01-14: 100 mL via INTRAVENOUS

## 2017-01-14 MED ORDER — ONDANSETRON HCL 4 MG/2ML IJ SOLN
4.0000 mg | Freq: Once | INTRAMUSCULAR | Status: AC
  Administered 2017-01-14: 4 mg via INTRAVENOUS

## 2017-01-14 MED ORDER — ONDANSETRON HCL 4 MG/2ML IJ SOLN
INTRAMUSCULAR | Status: AC
  Filled 2017-01-14: qty 2

## 2017-02-05 ENCOUNTER — Other Ambulatory Visit: Payer: Self-pay | Admitting: Family Medicine

## 2017-02-20 ENCOUNTER — Ambulatory Visit (INDEPENDENT_AMBULATORY_CARE_PROVIDER_SITE_OTHER): Payer: Self-pay | Admitting: Family Medicine

## 2017-02-20 ENCOUNTER — Other Ambulatory Visit: Payer: Self-pay | Admitting: Family Medicine

## 2017-02-20 VITALS — BP 110/70 | HR 64 | Temp 98.7°F | Resp 16 | Wt 185.4 lb

## 2017-02-20 DIAGNOSIS — G4700 Insomnia, unspecified: Secondary | ICD-10-CM

## 2017-02-20 DIAGNOSIS — K589 Irritable bowel syndrome without diarrhea: Secondary | ICD-10-CM | POA: Insufficient documentation

## 2017-02-20 MED ORDER — ZOLPIDEM TARTRATE ER 6.25 MG PO TBCR: 6.2500 mg | EXTENDED_RELEASE_TABLET | Freq: Every evening | ORAL | 0 refills | Status: DC | PRN

## 2017-02-20 NOTE — Progress Notes (Signed)
Subjective:     Patient ID: [name], female   DOB: 12-03-1975, 41 y.o.   MRN: [id number]  HPI  Chief Complaint  Patient presents with  . Insomnia    Patient would like to address issues with insomnia that have been increasingly getting worse over he past several weeks. Patient reports that she takes Trazodone  qhs and only sleeps on average 2-4hrs a night.   States she gets to sleep ok but will wake up after 3-4 hours and can't get back to sleep.   Review of Systems     Objective:   Physical Exam  Constitutional: She appears well-developed and well-nourished. No distress.  Yawning frequently during her visit.       Assessment:    1. Insomnia, unspecified type - zolpidem (AMBIEN CR) 6.25 MG CR tablet; Take 1 tablet (6.25 mg total) by mouth at bedtime as needed for sleep.  Dispense: 30 tablet; Refill: 0    Plan:    Discussed sleep hygiene. Phone f/u in a week.

## 2017-02-20 NOTE — Patient Instructions (Signed)
Allow yourself a 8 hour sleep time with this medication. 1.Keep the same bedtime every night 2.Use your bed for sleep and sex only 3. Avoid exercise or stimulating electronic activity (video games/smart phones) prior to bedtime 4.Avoid alcohol or caffeine 2-3 hours before bedtime 5.Take a bath/shower prior to bedtime. This will help cool your body down and prepare it for sleep. 6.If you can't sleep, get up and do a sleep inducing activity like reading,

## 2017-02-21 ENCOUNTER — Telehealth: Payer: Self-pay | Admitting: Family Medicine

## 2017-02-21 ENCOUNTER — Other Ambulatory Visit: Payer: Self-pay | Admitting: Family Medicine

## 2017-02-21 MED ORDER — ESZOPICLONE 2 MG PO TABS: 2.0000 mg | ORAL_TABLET | Freq: Every evening | ORAL | 0 refills | Status: DC | PRN

## 2017-02-21 MED ORDER — CLONAZEPAM 1 MG PO TABS: ORAL_TABLET | ORAL | 0 refills | Status: DC

## 2017-02-21 NOTE — Telephone Encounter (Signed)
Please review-Anastasiya V Hopkins, RMA  

## 2017-02-21 NOTE — Telephone Encounter (Signed)
Will try Lunesta.

## 2017-02-21 NOTE — Telephone Encounter (Signed)
Patient states second prescription that was sent in today [place]) is $262 dollars and she cannot pay for this. If something different can be send to the pharmacy that is cheaper.  Please Review.  Thanks,  -Nautika Cressey

## 2017-02-21 NOTE — Telephone Encounter (Signed)
Pt states Nadine Counts called in a new Rx for .  Pt states this was $154.00 and she did not get this.  Pt is asking if she can get something less expensive.  [facility] [address]  UJ#[phone]/FA

## 2017-02-21 NOTE — Telephone Encounter (Signed)
Discussed with patient. Currently does not have insurance. Has tried melatonin in the past and did not help. Will try clonazepam at bedtime.

## 2017-02-21 NOTE — Progress Notes (Signed)
rx called in-Anastasiya V Hopkins, RMA  

## 2017-02-21 NOTE — Progress Notes (Signed)
Called in generic Lunesta to [facility]. And D/C Ambien. sd

## 2017-02-21 NOTE — Telephone Encounter (Signed)
Please review-[name], RMA  

## 2017-02-28 NOTE — Telephone Encounter (Signed)
Pt has been taking the clonazepam to help her sleep.  She thinks it is causing her to have vertigo.  Please advise.  Pt's call back [phone]  Thanks teri

## 2017-02-28 NOTE — Telephone Encounter (Signed)
She can stop clonazepam over the weekend and call back if vertigo does not improve. If it does improve may need to try something else for sleep.

## 2017-02-28 NOTE — Telephone Encounter (Signed)
This is a patient of Bobs can you review chart please?KW

## 2017-03-03 NOTE — Telephone Encounter (Signed)
Left message to call back  

## 2017-07-18 ENCOUNTER — Emergency Department: Payer: Managed Care, Other (non HMO)

## 2017-07-18 ENCOUNTER — Other Ambulatory Visit: Payer: Self-pay

## 2017-07-18 DIAGNOSIS — R079 Chest pain, unspecified: Secondary | ICD-10-CM | POA: Insufficient documentation

## 2017-07-18 DIAGNOSIS — Z5321 Procedure and treatment not carried out due to patient leaving prior to being seen by health care provider: Secondary | ICD-10-CM | POA: Insufficient documentation

## 2017-07-18 LAB — BASIC METABOLIC PANEL
ANION GAP: 9 (ref 5–15)
BUN: 9 mg/dL (ref 6–20)
CALCIUM: 9.6 mg/dL (ref 8.9–10.3)
CO2: 25 mmol/L (ref 22–32)
CREATININE: 0.75 mg/dL (ref 0.44–1.00)
Chloride: 106 mmol/L (ref 101–111)
GFR calc Af Amer: >60 mL/min (ref >60)
GFR calc non Af Amer: >60 mL/min (ref >60)
GLUCOSE: 86 mg/dL (ref 65–99)
Potassium: 4.3 mmol/L (ref 3.5–5.1)
Sodium: 140 mmol/L (ref 135–145)

## 2017-07-18 LAB — CBC
HCT: 43.9 % (ref 35.0–47.0)
HEMOGLOBIN: 14.7 g/dL (ref 12.0–16.0)
MCH: 30.6 pg (ref 26.0–34.0)
MCHC: 33.4 g/dL (ref 32.0–36.0)
MCV: 91.5 fL (ref 80.0–100.0)
Platelets: 284 10*3/uL (ref 150–440)
RBC: 4.8 MIL/uL (ref 3.80–5.20)
RDW: 12.9 % (ref 11.5–14.5)
WBC: 7.3 10*3/uL (ref 3.6–11.0)

## 2017-07-18 LAB — TROPONIN I

## 2017-07-18 NOTE — ED Triage Notes (Signed)
Pt arrives to ED via POV from home with c/o chest pain, burning in her throat, and left arm pain x2 days. Pt reports chest pain "is just under her neck" without radiation; pt denies N/V/D, no fever, denies diaphoresis, no SHOB. Pt is A&O, in NAD; RR even, regular, and unlabored.

## 2017-07-19 ENCOUNTER — Emergency Department
Admission: EM | Admit: 2017-07-19 | Discharge: 2017-07-19 | Disposition: A | Discharge status: Home / Self Care | Payer: Managed Care, Other (non HMO) | Attending: Emergency Medicine | Admitting: Emergency Medicine

## 2017-07-19 NOTE — ED Notes (Signed)
Pt called x 3 for room. 

## 2017-07-19 NOTE — ED Notes (Signed)
Pt called x1 for room 

## 2017-07-19 NOTE — ED Notes (Signed)
Pt called x 2 for room. 

## 2017-10-16 ENCOUNTER — Encounter: Payer: Self-pay | Admitting: Family Medicine

## 2017-10-16 ENCOUNTER — Other Ambulatory Visit: Payer: Self-pay | Admitting: Family Medicine

## 2017-10-16 ENCOUNTER — Ambulatory Visit
Admission: RE | Admit: 2017-10-16 | Discharge: 2017-10-16 | Disposition: A | Discharge status: Home / Self Care | Payer: Managed Care, Other (non HMO) | Source: Ambulatory Visit | Attending: Family Medicine | Admitting: Family Medicine

## 2017-10-16 ENCOUNTER — Telehealth: Payer: Self-pay

## 2017-10-16 ENCOUNTER — Ambulatory Visit: Payer: Self-pay | Admitting: Family Medicine

## 2017-10-16 VITALS — BP 120/80 | HR 74 | Temp 98.9°F | Resp 16 | Wt 192.8 lb

## 2017-10-16 DIAGNOSIS — R1031 Right lower quadrant pain: Secondary | ICD-10-CM

## 2017-10-16 DIAGNOSIS — R1033 Periumbilical pain: Secondary | ICD-10-CM | POA: Insufficient documentation

## 2017-10-16 DIAGNOSIS — N83202 Unspecified ovarian cyst, left side: Secondary | ICD-10-CM | POA: Insufficient documentation

## 2017-10-16 DIAGNOSIS — Z90710 Acquired absence of both cervix and uterus: Secondary | ICD-10-CM | POA: Insufficient documentation

## 2017-10-16 LAB — POCT URINALYSIS DIPSTICK
Bilirubin, UA: NEGATIVE
GLUCOSE UA: NEGATIVE
Ketones, UA: NEGATIVE
Leukocytes, UA: NEGATIVE
Nitrite, UA: NEGATIVE
Protein, UA: NEGATIVE
Urobilinogen, UA: 0.2 E.U./dL
pH, UA: 5 (ref 5.0–8.0)

## 2017-10-16 MED ORDER — TRAMADOL HCL 50 MG PO TABS: 50.0000 mg | ORAL_TABLET | Freq: Three times a day (TID) | ORAL | 0 refills | Status: DC | PRN

## 2017-10-16 NOTE — Progress Notes (Signed)
  Subjective:     Patient ID: [name]   DOB: 1976-05-14, 41 y.o.   MRN: [id number] Chief Complaint  Patient presents with  . Skin Problem    Patient comes in office today for skin check, patient states that 4 days ago a "bump" appeared inside of her belly button and states that when she pressed on "bump" it seemed to move. Paitent states that skin is very tender to the touch and site is painful to push on. Patient states that when she bends down she has sharp pain in her abdomen. Patient denies any of hernia, paitent states that she was constipated but has since passed bowel movement.    HPI States it is not unusual for her to go 7-14 days before she will use a laxative to move her bowels. Denies lifting and no longer has an appendix, gall bladder, or uterus. She has had a tubal ligation. No hx of kidney stone with remote hx of ovarian cyst.  Review of Systems     Objective:   Physical Exam  Constitutional: She appears well-developed and well-nourished. She appears distressed (moderate abdominal pain).  Abdominal: Soft. Tenderness: right lower quadrant with guarding and tenderness in her umbiicus area         Assessment:    1. Right lower quadrant abdominal pain: evaluate for ovarian cyst - traMADol (ULTRAM) 50 MG tablet; Take 1 tablet (50 mg total) by mouth every 8 (eight) hours as needed.  Dispense: 30 tablet; Refill: 0 - US Pelvis Complete; Future - POCT urinalysis dipstick 2. Periumbilical abdominal pain - traMADol (ULTRAM) 50 MG tablet; Take 1 tablet (50 mg total) by mouth every 8 (eight) hours as needed.  Dispense: 30 tablet; Refill: 0 - US Pelvis Complete; Future    Plan:    Add Miralax; Further f/u pending ultrasound result.

## 2017-10-16 NOTE — Patient Instructions (Addendum)
Discussed use of Miralax daily to keep bowels moving regularly. We will call you about the ultrasound scheduling. May also use Tylenol for pain.

## 2017-10-16 NOTE — Telephone Encounter (Signed)
Outpatient imaging called to give you the results of her [place]. Results are available to review. Thanks!

## 2017-10-17 ENCOUNTER — Other Ambulatory Visit: Payer: Self-pay | Admitting: Family Medicine

## 2017-10-17 DIAGNOSIS — R1033 Periumbilical pain: Secondary | ICD-10-CM

## 2017-10-17 LAB — CBC WITH DIFFERENTIAL/PLATELET
BASOS ABS: 0 10*3/uL (ref 0.0–0.2)
BASOS: 0 %
EOS (ABSOLUTE): 0.1 10*3/uL (ref 0.0–0.4)
Eos: 1 %
HEMATOCRIT: 41.9 % (ref 34.0–46.6)
HEMOGLOBIN: 13.7 g/dL (ref 11.1–15.9)
IMMATURE GRANS (ABS): 0 10*3/uL (ref 0.0–0.1)
Immature Granulocytes: 0 %
LYMPHS ABS: 2.1 10*3/uL (ref 0.7–3.1)
LYMPHS: 23 %
MCH: 30 pg (ref 26.6–33.0)
MCHC: 32.7 g/dL (ref 31.5–35.7)
MCV: 92 fL (ref 79–97)
MONOCYTES: 5 %
Monocytes Absolute: 0.4 10*3/uL (ref 0.1–0.9)
NEUTROS ABS: 6.5 10*3/uL (ref 1.4–7.0)
Neutrophils: 71 %
Platelets: 309 10*3/uL (ref 150–379)
RBC: 4.57 x10E6/uL (ref 3.77–5.28)
RDW: 13.1 % (ref 12.3–15.4)
WBC: 9.1 10*3/uL (ref 3.4–10.8)

## 2017-10-17 LAB — COMPREHENSIVE METABOLIC PANEL
A/G RATIO: 2.1 (ref 1.2–2.2)
ALK PHOS: 74 IU/L (ref 39–117)
ALT: 15 IU/L (ref 0–32)
AST: 14 IU/L (ref 0–40)
Albumin: 4.6 g/dL (ref 3.5–5.5)
BUN / CREAT RATIO: 13 (ref 9–23)
BUN: 11 mg/dL (ref 6–20)
Bilirubin Total: 0.3 mg/dL (ref 0.0–1.2)
CO2: 19 mmol/L — ABNORMAL LOW (ref 20–29)
CREATININE: 0.86 mg/dL (ref 0.57–1.00)
Calcium: 9.3 mg/dL (ref 8.7–10.2)
Chloride: 104 mmol/L (ref 96–106)
GFR calc Af Amer: 98 mL/min/{1.73_m2} (ref >59)
GFR, EST NON AFRICAN AMERICAN: 85 mL/min/{1.73_m2} (ref >59)
GLUCOSE: 155 mg/dL — AB (ref 65–99)
Globulin, Total: 2.2 g/dL (ref 1.5–4.5)
POTASSIUM: 3.7 mmol/L (ref 3.5–5.2)
Sodium: 137 mmol/L (ref 134–144)
TOTAL PROTEIN: 6.8 g/dL (ref 6.0–8.5)

## 2017-10-17 LAB — LIPASE: Lipase: 74 U/L — ABNORMAL HIGH (ref 14–72)

## 2017-10-21 ENCOUNTER — Ambulatory Visit
Admission: RE | Admit: 2017-10-21 | Discharge: 2017-10-21 | Disposition: A | Discharge status: Home / Self Care | Payer: Self-pay | Source: Ambulatory Visit | Attending: Family Medicine | Admitting: Family Medicine

## 2017-10-21 ENCOUNTER — Other Ambulatory Visit: Payer: Self-pay | Admitting: Family Medicine

## 2017-10-21 DIAGNOSIS — R1033 Periumbilical pain: Secondary | ICD-10-CM | POA: Insufficient documentation

## 2017-10-21 MED ORDER — ONDANSETRON HCL 8 MG PO TABS: 8.0000 mg | ORAL_TABLET | Freq: Three times a day (TID) | ORAL | 1 refills | Status: DC | PRN

## 2017-10-21 MED ORDER — IOPAMIDOL (ISOVUE-300) INJECTION 61%
100.0000 mL | Freq: Once | INTRAVENOUS | Status: AC | PRN
  Administered 2017-10-21: 100 mL via INTRAVENOUS

## 2017-10-31 ENCOUNTER — Telehealth: Payer: Self-pay | Admitting: Family Medicine

## 2017-10-31 NOTE — Telephone Encounter (Signed)
See contact information

## 2017-11-10 ENCOUNTER — Encounter: Payer: Self-pay | Admitting: Family Medicine

## 2017-11-10 ENCOUNTER — Ambulatory Visit: Payer: Self-pay | Admitting: Family Medicine

## 2017-11-10 VITALS — BP 126/84 | HR 76 | Temp 98.7°F | Resp 16 | Wt 195.0 lb

## 2017-11-10 DIAGNOSIS — S39012A Strain of muscle, fascia and tendon of lower back, initial encounter: Secondary | ICD-10-CM

## 2017-11-10 DIAGNOSIS — S161XXA Strain of muscle, fascia and tendon at neck level, initial encounter: Secondary | ICD-10-CM

## 2017-11-10 MED ORDER — OXYCODONE-ACETAMINOPHEN 5-325 MG PO TABS: 1.0000 | ORAL_TABLET | ORAL | 0 refills | Status: DC | PRN

## 2017-11-10 MED ORDER — CYCLOBENZAPRINE HCL 5 MG PO TABS: 5.0000 mg | ORAL_TABLET | Freq: Three times a day (TID) | ORAL | 0 refills | Status: DC | PRN

## 2017-11-10 NOTE — Patient Instructions (Signed)
Discussed use of two Aleve twice daily with food. Let me know if not improving over the next week.
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
## 2017-11-19 ENCOUNTER — Ambulatory Visit: Payer: Self-pay | Admitting: Family Medicine

## 2017-11-19 ENCOUNTER — Encounter: Payer: Self-pay | Admitting: Family Medicine

## 2017-11-19 VITALS — BP 122/80 | HR 65 | Temp 98.7°F | Resp 16 | Wt 193.0 lb

## 2017-11-19 DIAGNOSIS — S161XXA Strain of muscle, fascia and tendon at neck level, initial encounter: Secondary | ICD-10-CM

## 2017-11-19 DIAGNOSIS — R202 Paresthesia of skin: Secondary | ICD-10-CM

## 2017-11-19 MED ORDER — PREDNISONE 10 MG PO TABS: ORAL_TABLET | ORAL | 0 refills | Status: DC
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
## 2017-11-19 NOTE — Patient Instructions (Signed)
Let me know if not improving on the prednisone. Stop Aleve while on oral steroids.

## 2017-11-25 ENCOUNTER — Telehealth: Payer: Self-pay | Admitting: Family Medicine

## 2017-11-25 ENCOUNTER — Other Ambulatory Visit: Payer: Self-pay | Admitting: Family Medicine

## 2017-11-25 DIAGNOSIS — M542 Cervicalgia: Secondary | ICD-10-CM

## 2017-11-25 NOTE — Telephone Encounter (Signed)
Pt would like Nadine CountsBob to call her regarding her neck pain  Pt's CB is [phone]  Thanks Barth Kirksteri

## 2017-11-25 NOTE — Telephone Encounter (Signed)
Fyi. KW 

## 2017-11-25 NOTE — Telephone Encounter (Signed)
Neck pain still present with mild improvement after prednisone: "I feel like there is a TENS unit on my neck." Will update c-spine x-ray.

## 2017-11-26 ENCOUNTER — Ambulatory Visit
Admission: RE | Admit: 2017-11-26 | Discharge: 2017-11-26 | Disposition: A | Discharge status: Home / Self Care | Payer: No Typology Code available for payment source | Source: Ambulatory Visit | Attending: Family Medicine | Admitting: Family Medicine

## 2017-11-26 DIAGNOSIS — M47812 Spondylosis without myelopathy or radiculopathy, cervical region: Secondary | ICD-10-CM | POA: Insufficient documentation

## 2017-11-26 DIAGNOSIS — M542 Cervicalgia: Secondary | ICD-10-CM | POA: Diagnosis not present

## 2018-02-11 ENCOUNTER — Encounter: Payer: Self-pay | Admitting: Family Medicine

## 2018-02-13 ENCOUNTER — Other Ambulatory Visit: Payer: Self-pay | Admitting: Family Medicine

## 2018-04-19 ENCOUNTER — Other Ambulatory Visit: Payer: Self-pay | Admitting: Family Medicine

## 2018-04-27 ENCOUNTER — Encounter: Payer: Self-pay | Admitting: Family Medicine

## 2018-04-27 ENCOUNTER — Ambulatory Visit: Payer: BLUE CROSS/BLUE SHIELD | Admitting: Family Medicine

## 2018-04-27 VITALS — BP 110/72 | HR 66 | Temp 98.9°F | Resp 16 | Wt 198.6 lb

## 2018-04-27 DIAGNOSIS — Z716 Tobacco abuse counseling: Secondary | ICD-10-CM | POA: Diagnosis not present

## 2018-04-27 DIAGNOSIS — R110 Nausea: Secondary | ICD-10-CM | POA: Diagnosis not present

## 2018-04-27 MED ORDER — VARENICLINE TARTRATE 0.5 MG X 11 & 1 MG X 42 PO MISC: ORAL | 0 refills | Status: DC

## 2018-04-27 NOTE — Progress Notes (Signed)
  Subjective:     Patient ID: [name], female   DOB: [birth year], 42 y.o.   MRN: [id number] Chief Complaint  Patient presents with  . Nicotine Dependence    Patient comes in office today to address smoking cessation. Patient states that she has successfully quit before in the past on Chantix. Patient states that she has tried nicotene gum and patches with no success, patient smokes about 3/4 of a pack a day.    HPI Wishes to try Chantix again: "I have been smoking since I was 16". Also has been having mild nausea and loose stools for the last two weeks. She has had a cholecystectomy. Reports her mother is recovering from orthopedic surgery and her 40 year old son has moved out on his own. States trazodone continues to help her sleep.  Review of Systems     Objective:   Physical Exam  Constitutional: She appears well-developed and well-nourished. No distress.  Abdominal: Bowel sounds are normal. There is tenderness (mild epigastric).       Assessment:    1. Encounter for smoking cessation counseling: start Chantix starter pack  2. Nausea: Try Dexilant 60 mg. (10 samples provided)    Plan:    Call if nausea not improving and for Chantix continuation pack in a month.

## 2018-04-27 NOTE — Patient Instructions (Signed)
Try Dexilant and let me know if it helps with your nausea. Also call for Chantix continuation pack when ready in a month..Marland Kitchen

## 2018-05-25 ENCOUNTER — Other Ambulatory Visit: Payer: Self-pay | Admitting: Family Medicine

## 2018-05-25 ENCOUNTER — Telehealth: Payer: Self-pay

## 2018-05-25 MED ORDER — VARENICLINE TARTRATE 1 MG PO TABS
1.0000 mg | ORAL_TABLET | Freq: Two times a day (BID) | ORAL | 1 refills | Status: DC
Start: 2018-05-25 — End: 2018-07-08

## 2018-05-25 NOTE — Telephone Encounter (Signed)
Sent in continuation pack #60 1 refill.

## 2018-05-25 NOTE — Telephone Encounter (Signed)
Patient [facility] [phone]

## 2018-06-15 ENCOUNTER — Encounter: Payer: Self-pay | Admitting: Family Medicine

## 2018-06-15 ENCOUNTER — Other Ambulatory Visit: Payer: Self-pay | Admitting: Family Medicine

## 2018-06-15 ENCOUNTER — Ambulatory Visit: Payer: BLUE CROSS/BLUE SHIELD | Admitting: Family Medicine

## 2018-06-15 VITALS — BP 128/84 | HR 78 | Temp 98.5°F | Wt 196.0 lb

## 2018-06-15 DIAGNOSIS — B349 Viral infection, unspecified: Secondary | ICD-10-CM | POA: Diagnosis not present

## 2018-06-15 DIAGNOSIS — Z1239 Encounter for other screening for malignant neoplasm of breast: Secondary | ICD-10-CM | POA: Diagnosis not present

## 2018-06-15 NOTE — Patient Instructions (Addendum)
Discussed use of Mucinex D for congestion, Delsym for cough, and Benadryl for postnasal drainage. Let me know if new symptoms or not improving over the course of the week. Call [facility] at your convenience and set up the mammogram.

## 2018-06-15 NOTE — Progress Notes (Signed)
  Subjective:     Patient ID: [name], female   DOB: 12/10/1975, 43 y.o.   MRN: [id number] Chief Complaint  Patient presents with  . URI    Patient presents today for upper respiratory infection symptoms for about 3 days now. Patient states she is having the following symptoms: chest congestion, headaches and diarrhea 06/14/2018.   HPI Reports mild sinus congestion and chills but no fever. States her husband has been sick as well. Also wishes screening for breast cancer. States two maternal aunts have had breast cancer. She has quit smoking with the use of Chantix.  Review of Systems     Objective:   Physical Exam Constitutional:      General: She is not in acute distress.    Appearance: She is ill-appearing.  Neurological:     Mental Status: She is alert.   Ears: T.M's intact without inflammation Throat: no tonsillar enlargement or exudate Neck: no cervical adenopathy Lungs: clear     Assessment:    1. Acute viral syndrome  2. Screening for breast cancer - MM 3D SCREEN BREAST BILATERAL; Future    Plan:    Discussed use of Mucinex D for congestion, Delsym for cough, and Benadryl for postnasal drainage. May call if new symptoms or not improving over the course of the week.

## 2018-06-16 ENCOUNTER — Telehealth: Payer: Self-pay | Admitting: Family Medicine

## 2018-06-16 ENCOUNTER — Other Ambulatory Visit: Payer: Self-pay | Admitting: Family Medicine

## 2018-06-16 DIAGNOSIS — R05 Cough: Secondary | ICD-10-CM

## 2018-06-16 DIAGNOSIS — R059 Cough, unspecified: Secondary | ICD-10-CM

## 2018-06-16 MED ORDER — OSELTAMIVIR PHOSPHATE 75 MG PO CAPS: 75.0000 mg | ORAL_CAPSULE | Freq: Two times a day (BID) | ORAL | 0 refills | Status: DC

## 2018-06-16 NOTE — Telephone Encounter (Signed)
Pt calling back to let Nadine Counts know she is feeling worse today.  She started running a fever last night and has kept one all day today at 101.1.    Please advise.  Thanks, [employer]

## 2018-06-16 NOTE — Telephone Encounter (Signed)
See below and advise. KW

## 2018-06-16 NOTE — Telephone Encounter (Signed)
Reports she now has developed fever and body aches since office visit. Will send in Tamiflu and order a CXR.

## 2018-07-04 DIAGNOSIS — Z1371 Encounter for nonprocreative screening for genetic disease carrier status: Secondary | ICD-10-CM

## 2018-07-04 HISTORY — DX: Encounter for nonprocreative screening for genetic disease carrier status: Z13.71

## 2018-07-08 ENCOUNTER — Encounter: Payer: Self-pay | Admitting: Family Medicine

## 2018-07-08 ENCOUNTER — Other Ambulatory Visit: Payer: Self-pay

## 2018-07-08 ENCOUNTER — Ambulatory Visit: Payer: BLUE CROSS/BLUE SHIELD | Admitting: Family Medicine

## 2018-07-08 ENCOUNTER — Ambulatory Visit
Admission: RE | Admit: 2018-07-08 | Discharge: 2018-07-08 | Disposition: A | Discharge status: Home / Self Care | Payer: BLUE CROSS/BLUE SHIELD | Source: Ambulatory Visit | Attending: Family Medicine | Admitting: Family Medicine

## 2018-07-08 VITALS — BP 120/72 | HR 60 | Temp 98.5°F | Ht 66.5 in | Wt 189.0 lb

## 2018-07-08 DIAGNOSIS — F439 Reaction to severe stress, unspecified: Secondary | ICD-10-CM | POA: Diagnosis not present

## 2018-07-08 DIAGNOSIS — R42 Dizziness and giddiness: Secondary | ICD-10-CM

## 2018-07-08 DIAGNOSIS — N3090 Cystitis, unspecified without hematuria: Secondary | ICD-10-CM

## 2018-07-08 DIAGNOSIS — Z1239 Encounter for other screening for malignant neoplasm of breast: Secondary | ICD-10-CM | POA: Insufficient documentation

## 2018-07-08 LAB — POCT URINALYSIS DIPSTICK
Glucose, UA: NEGATIVE
Ketones, UA: NEGATIVE
LEUKOCYTES UA: NEGATIVE
NITRITE UA: NEGATIVE
PROTEIN UA: NEGATIVE
RBC UA: NEGATIVE
Urobilinogen, UA: 0.2 E.U./dL
pH, UA: 5 (ref 5.0–8.0)

## 2018-07-08 LAB — GLUCOSE, POCT (MANUAL RESULT ENTRY): POC GLUCOSE: 97 mg/dL (ref 70–99)

## 2018-07-08 MED ORDER — CEPHALEXIN 500 MG PO CAPS: 500.0000 mg | ORAL_CAPSULE | Freq: Two times a day (BID) | ORAL | 0 refills | Status: DC

## 2018-07-08 MED ORDER — FLUOXETINE HCL 20 MG PO TABS: 20.0000 mg | ORAL_TABLET | Freq: Every day | ORAL | 1 refills | Status: DC

## 2018-07-08 NOTE — Progress Notes (Signed)
  Subjective:     Patient ID: [name], female   DOB: 1975/12/06, 43 y.o.   MRN: [id number] Chief Complaint  Patient presents with  . Fatigue    not feeling like herself, mouth dry can't quinch thirst, hard time focusing, dizzy spells since  06/24/18  . Urinary Tract Infection    burning with urination, pressure in lower abdomen 07/08/18   HPI States she quit smoking after using Chantix for 7 weeks. Reports significant stress due to increased work load and being "disowned" by her father due to her daughter's interracial dating. Reports tolerance to fluoxetine in the past.  Review of Systems     Objective:   Physical Exam Eyes:     Extraocular Movements: Extraocular movements intact.     Pupils: Pupils are equal, round, and reactive to light.  Cardiovascular:     Rate and Rhythm: Normal rate and regular rhythm.  Pulmonary:     Breath sounds: Normal breath sounds.  Musculoskeletal:     Comments: Grip strength 5/5 symmetircally  Neurological:     Coordination: Coordination normal.        Assessment:    1. Dizziness - POCT glucose (manual entry)  2. Situational stress - FLUoxetine (PROZAC) 20 MG tablet; Take 1 tablet (20 mg total) by mouth daily.  Dispense: 30 tablet; Refill: 1  3. Cystitis - Urine Culture - POCT urinalysis dipstick - cephALEXin (KEFLEX) 500 MG capsule; Take 1 capsule (500 mg total) by mouth 2 (two) times daily.  Dispense: 14 capsule; Refill: 0    Plan:    Further f/u pending urine culture and phone f/u in two weeks re: fluoxetine

## 2018-07-08 NOTE — Patient Instructions (Addendum)
We will call you with the urine culture results. Phone follow up in two weeks about response to fluoxetine.

## 2018-07-09 ENCOUNTER — Other Ambulatory Visit: Payer: Self-pay | Admitting: Family Medicine

## 2018-07-09 DIAGNOSIS — Z1239 Encounter for other screening for malignant neoplasm of breast: Secondary | ICD-10-CM

## 2018-07-10 LAB — URINE CULTURE

## 2018-07-14 ENCOUNTER — Ambulatory Visit
Admission: RE | Admit: 2018-07-14 | Discharge: 2018-07-14 | Disposition: A | Discharge status: Home / Self Care | Payer: BLUE CROSS/BLUE SHIELD | Source: Ambulatory Visit | Attending: Family Medicine | Admitting: Family Medicine

## 2018-07-14 DIAGNOSIS — Z1239 Encounter for other screening for malignant neoplasm of breast: Secondary | ICD-10-CM | POA: Diagnosis not present

## 2018-07-14 DIAGNOSIS — N6489 Other specified disorders of breast: Secondary | ICD-10-CM | POA: Diagnosis not present

## 2018-07-14 DIAGNOSIS — R928 Other abnormal and inconclusive findings on diagnostic imaging of breast: Secondary | ICD-10-CM | POA: Diagnosis not present

## 2018-07-15 ENCOUNTER — Other Ambulatory Visit: Payer: Self-pay | Admitting: Family Medicine

## 2018-07-15 DIAGNOSIS — Z8041 Family history of malignant neoplasm of ovary: Secondary | ICD-10-CM

## 2018-07-15 MED ORDER — TRAZODONE HCL 50 MG PO TABS: ORAL_TABLET | ORAL | 1 refills | Status: DC

## 2018-07-16 ENCOUNTER — Telehealth: Payer: Self-pay | Admitting: Obstetrics & Gynecology

## 2018-07-16 NOTE — Telephone Encounter (Signed)
[facility] referring for Family history of ovarian cancer. Called and left voicemail for patient to call back to be schedule

## 2018-07-27 ENCOUNTER — Encounter: Payer: Self-pay | Admitting: Obstetrics and Gynecology

## 2018-07-27 ENCOUNTER — Ambulatory Visit (INDEPENDENT_AMBULATORY_CARE_PROVIDER_SITE_OTHER): Payer: BLUE CROSS/BLUE SHIELD | Admitting: Obstetrics and Gynecology

## 2018-07-27 VITALS — BP 110/74 | HR 83 | Ht 66.5 in | Wt 190.0 lb

## 2018-07-27 DIAGNOSIS — Z808 Family history of malignant neoplasm of other organs or systems: Secondary | ICD-10-CM | POA: Diagnosis not present

## 2018-07-27 DIAGNOSIS — Z8041 Family history of malignant neoplasm of ovary: Secondary | ICD-10-CM | POA: Diagnosis not present

## 2018-07-27 DIAGNOSIS — Z1379 Encounter for other screening for genetic and chromosomal anomalies: Secondary | ICD-10-CM

## 2018-07-27 DIAGNOSIS — Z800 Family history of malignant neoplasm of digestive organs: Secondary | ICD-10-CM | POA: Diagnosis not present

## 2018-07-27 DIAGNOSIS — Z803 Family history of malignant neoplasm of breast: Secondary | ICD-10-CM | POA: Diagnosis not present

## 2018-07-27 NOTE — Progress Notes (Signed)
Obstetrics & Gynecology Office Visit   Chief Complaint:  Chief Complaint  Patient presents with  . Gynecologic Exam    Referred by [facility] family history ovarian cancer    History of Present Illness: 43 year old G2P2 seen in consultation at the request of Carmon Ginsberg, [place] at [facility] given family history of ovarian cancer.  The patient reports a family history in her maternal aunt, a history of breast cancer in a second maternal aunt, and history of pancreatic cancer in maternal grandmother.  No family history of colon cancer.  The patient is status post TVH for benign reason, prior partial salpingectomy preceding TVH.  She is up to date on mammograms.  She did have recent imaging with CT abdomen and pelvis 10/21/2017 at which time there were no gynecologic abnormalities imaged.  She is interest in surveillance or testing strategies given her cancer history.  Review of Systems: Review of Systems  Constitutional: Negative.   Gastrointestinal: Negative.   Skin: Negative.      Past Medical History:  Past Medical History:  Diagnosis Date  . History of frequent urinary tract infections   . Hypoglycemia   . IBS (irritable bowel syndrome)     Past Surgical History:  Past Surgical History:  Procedure Laterality Date  . ABDOMINAL HYSTERECTOMY  07/2006   PARTIAL  . BACK SURGERY    . CHOLECYSTECTOMY    . DIAGNOSTIC LAPAROSCOPY     test on bladder  . GALLBLADDER SURGERY    . LAPAROSCOPIC APPENDECTOMY N/A 12/08/2016   Procedure: APPENDECTOMY LAPAROSCOPIC;  Surgeon: Jules Husbands, MD;  Location: [facility] ORS;  Service: General;  Laterality: N/A;  . MAXIMUM ACCESS (MAS)POSTERIOR LUMBAR INTERBODY FUSION (PLIF) 1 LEVEL N/A 05/06/2013   Procedure: LUMBAR FIVE-SACRAL ONE MAXIMUM ACCESS POSTERIOR LUMBAR INTERBODY FUSION;  Surgeon: Eustace Moore, MD;  Location: [place] [facility]URO ORS;  Service: Neurosurgery;  Laterality: N/A;  . TOE SURGERY    . TUBAL LIGATION    .  WISDOM TOOTH EXTRACTION      Gynecologic History: No LMP recorded. Patient has had a hysterectomy.  Obstetric History: No obstetric history on file.  Family History:  Family History  Problem Relation Age of Onset  . Hypertension Mother   . COPD Mother   . Epilepsy Mother   . Hypertension Father   . Breast cancer Maternal Aunt 42  . Ovarian cancer Maternal Aunt 73  . Colon cancer Paternal Uncle 47  . Pancreatic cancer Maternal Grandmother 27  . Colon cancer Maternal Grandfather 33    Social History:  Social History   Socioeconomic History  . Marital status: Married    Spouse name: Not on file  . Number of children: Not on file  . Years of education: Not on file  . Highest education level: Not on file  Occupational History  . Not on file  Social Needs  . Financial resource strain: Not on file  . Food insecurity:    Worry: Not on file    Inability: Not on file  . Transportation needs:    Medical: Not on file    Non-medical: Not on file  Tobacco Use  . Smoking status: Former Smoker    Packs/day: 0.50    Years: 20.00    Pack years: 10.00    Types: Cigarettes    Last attempt to quit: 05/30/2018    Years since quitting: 0.1  . Smokeless tobacco: Never Used  Substance and Sexual Activity  .  Alcohol use: No    Alcohol/week: 0.0 standard drinks  . Drug use: No  . Sexual activity: Yes    Birth control/protection: Surgical    Comment: Hysterectomy  Lifestyle  . Physical activity:    Days per week: Not on file    Minutes per session: Not on file  . Stress: Not on file  Relationships  . Social connections:    Talks on phone: Not on file    Gets together: Not on file    Attends religious service: Not on file    Active member of club or organization: Not on file    Attends meetings of clubs or organizations: Not on file    Relationship status: Not on file  . Intimate partner violence:    Fear of current or ex partner: Not on file    Emotionally abused: Not on  file    Physically abused: Not on file    Forced sexual activity: Not on file  Other Topics Concern  . Not on file  Social History Narrative  . Not on file    Allergies:  Allergies  Allergen Reactions  . Codeine Nausea And Vomiting  . Prednisolone     Other reaction(s): Dizziness, Irregular heart rate  . Sulfa Antibiotics Rash  . Vicodin [Hydrocodone-Acetaminophen] Nausea Only    And itching all over    Medications: Prior to Admission medications   Medication Sig Start Date End Date Taking? Authorizing Provider  cephALEXin (KEFLEX) 500 MG capsule Take 1 capsule (500 mg total) by mouth 2 (two) times daily. 07/08/18   Carmon Ginsberg, PA  FLUoxetine (PROZAC) 20 MG tablet Take 1 tablet (20 mg total) by mouth daily. 07/08/18   Carmon Ginsberg, PA  phentermine (ADIPEX-P) 37.5 MG tablet Take 37.5 mg by mouth daily before breakfast.    [provider]  Sennosides (SENNA LAXATIVE PO) Take by mouth.    [provider]  traZODone (DESYREL) 50 MG tablet TAKE 1 TO 2 TABLETS BY MOUTH IN THE EVENING AS NEEDED FOR INSOMNIA. 07/15/18   Carmon Ginsberg, PA    Physical Exam Vitals:  Vitals:   07/27/18 1329  BP: 110/74  Pulse: 83   No LMP recorded. Patient has had a hysterectomy.  General: NAD HEENT: normocephalic, anicteric Pulmonary: No increased work of breathing Cardiovascular: RRR, distal pulses 2+ Genitourinary:  External: Normal external female genitalia.  Normal urethral meatus, normal  Bartholin's and Skene's glands.    Vagina: Normal vaginal mucosa, no evidence of prolapse.    Cervix: surgically asbent  Uterus: surgically absent  Adnexa: ovaries non-enlarged, no adnexal masses  Rectal: deferred  Lymphatic: no evidence of inguinal lymphadenopathy Extremities: no edema, erythema, or tenderness Neurologic: Grossly intact Psychiatric: mood appropriate, affect full  Female chaperone present for pelvic  portions of the physical exam  Assessment: 43 y.o.   Presenting for consultation secondary to a family history of ovarian cancer  Plan: Problem List Items Addressed This Visit    None    Visit Diagnoses    Family history of ovarian cancer    -  Primary   Relevant Orders   Integrated BRACAnalysis [facility])   Family history of breast cancer       Relevant Orders   Integrated BRACAnalysis ([facility])   Nonprocreative genetic screening       Relevant Orders   Integrated BRACAnalysis ([place])     1) Pap smear - per ASCCP guidelines pap may be discontinued in the  setting of prior hysterectomy without prior history of dysplasia.  Records going back to 2001 reviewed with no evidence of dysplasia  of CIN II or greater(on colposcopy showing benign inflammatory changes 2003, CIN in 2001)  2) Family history of ovarian cancer - currently there is no recommendation for routine CA-125 and transvaginal ultrasound surveillance outside of BRCA mutation carriers.  In fact given the high risk of false positive triggering unnecessary surgical interventions these modalities are actually discouraged.  Counseling BRCA - I offered referral to genetic counseling for discussion MyRisk testing because of her significant family history of breast and/or ovarian cancer.  If a mutation is found, the patient would be advised of an increased risk of breast cancer and ovarian cancer.  If a mutation is identified other family members could be tested and would have the opportunity to take advantage of approaches to prevention and early detection of breast and ovarian cancer.    - MyRisk testing obtained today  3) A total of 30 minutes were spent in face-to-face contact with the patient during this encounter with over half of that time devoted to counseling and coordination of care. Prior records and pathology reviewed.    4) Return in about 1 year (around 07/28/2019) for annual.   Malachy Mood, MD, [place], [facility] 07/27/2018, 1:31 PM

## 2018-07-29 ENCOUNTER — Encounter: Payer: Self-pay | Admitting: Obstetrics and Gynecology

## 2018-07-30 ENCOUNTER — Ambulatory Visit: Payer: BLUE CROSS/BLUE SHIELD | Admitting: Family Medicine

## 2018-08-03 ENCOUNTER — Encounter: Payer: Self-pay | Admitting: Obstetrics and Gynecology

## 2018-08-10 ENCOUNTER — Encounter: Payer: Self-pay | Admitting: Obstetrics and Gynecology

## 2018-08-10 DIAGNOSIS — Z1371 Encounter for nonprocreative screening for genetic disease carrier status: Secondary | ICD-10-CM | POA: Insufficient documentation

## 2018-08-27 ENCOUNTER — Ambulatory Visit (INDEPENDENT_AMBULATORY_CARE_PROVIDER_SITE_OTHER): Payer: BLUE CROSS/BLUE SHIELD | Admitting: Physician Assistant

## 2018-08-27 ENCOUNTER — Encounter: Payer: Self-pay | Admitting: Physician Assistant

## 2018-08-27 ENCOUNTER — Other Ambulatory Visit: Payer: Self-pay

## 2018-08-27 VITALS — BP 103/67 | HR 65 | Temp 98.9°F | Resp 16 | Wt 182.0 lb

## 2018-08-27 DIAGNOSIS — F439 Reaction to severe stress, unspecified: Secondary | ICD-10-CM | POA: Diagnosis not present

## 2018-08-27 DIAGNOSIS — R1084 Generalized abdominal pain: Secondary | ICD-10-CM | POA: Diagnosis not present

## 2018-08-27 DIAGNOSIS — R197 Diarrhea, unspecified: Secondary | ICD-10-CM | POA: Diagnosis not present

## 2018-08-27 MED ORDER — FLUOXETINE HCL 40 MG PO CAPS: 40.0000 mg | ORAL_CAPSULE | Freq: Every day | ORAL | 1 refills | Status: DC

## 2018-08-27 MED ORDER — FLUOXETINE HCL 20 MG PO TABS: 20.0000 mg | ORAL_TABLET | Freq: Every day | ORAL | 1 refills | Status: DC

## 2018-08-27 MED ORDER — DICYCLOMINE HCL 10 MG PO CAPS: 10.0000 mg | ORAL_CAPSULE | Freq: Three times a day (TID) | ORAL | 0 refills | Status: DC

## 2018-08-27 NOTE — Progress Notes (Signed)
Patient: [name] Female    DOB: [birth year]   43 y.o.   MRN: [id number] Visit Date: 08/27/2018  Today's Provider: Trinna Post, PA-C   Chief Complaint  Patient presents with  . Nausea   Subjective:     HPI   Nausea an bowel habit changes:  Patient with history of IBS -C comes in today complaining of bowel changes fron the past 3-4 weeks. Normally has bowel movement every 7-12 days. Now having bowel movements 4 times per day. She reports having symptoms of changing in stool color (khaki colored), nausea, abdominal pain and gas. Reports bright red blood stool every time on the tissue. Reports burning and stinging pain when having bowel movement. Patient states that her stool floats in the toilet bowl and she has notice that she has a bowel movement every tine she urinates. One uncle and grandfather on opposite sides with colon cancer. No family history of celiac disease, crohns or ulcerative colitis. Has not been camping or international travelling. No recent antibiotics in the past three months. Colonoscopy 2010 was normal, performed by Dr. Allen Norris. She is about to lose her insurance at the end of the month.    Wt Readings from Last 3 Encounters:  08/27/18 182 lb (82.6 kg)  07/27/18 190 lb (86.2 kg)  07/08/18 189 lb (85.7 kg)   Depression and Anxiety  Reports she was started by Mariel Sleet, PA-C on prozac 20 mg daily a few months ago for anxiety and depression. She reports this is helping her and doing well on it. She recently became the manager of a gym. She is also going through some home life stressors, she learned her daughter is using drugs and most recently her daughter has moved out of the home and in with her father.   Allergies  Allergen Reactions  . Codeine Nausea And Vomiting  . Prednisolone     Other reaction(s): Dizziness, Irregular heart rate  . Sulfa Antibiotics Rash  . Vicodin [Hydrocodone-Acetaminophen] Nausea Only    And itching all over      Current Outpatient Medications:  .  FLUoxetine (PROZAC) 20 MG tablet, Take 1 tablet (20 mg total) by mouth daily., Disp: 30 tablet, Rfl: 1 .  traZODone (DESYREL) 50 MG tablet, TAKE 1 TO 2 TABLETS BY MOUTH IN THE EVENING AS NEEDED FOR INSOMNIA., Disp: 180 tablet, Rfl: 1  Review of Systems  Social History   Tobacco Use  . Smoking status: Former Smoker    Packs/day: 0.50    Years: 20.00    Pack years: 10.00    Types: Cigarettes    Last attempt to quit: 05/30/2018    Years since quitting: 0.2  . Smokeless tobacco: Never Used  Substance Use Topics  . Alcohol use: No    Alcohol/week: 0.0 standard drinks      Objective:   BP 103/67   Pulse 65   Temp 98.9 F (37.2 C) (Oral)   Resp 16   Wt 182 lb (82.6 kg)   SpO2 98% Comment: room air  BMI 28.94 kg/m  Vitals:   08/27/18 0853  BP: 103/67  Pulse: 65  Resp: 16  Temp: 98.9 F (37.2 C)  TempSrc: Oral  SpO2: 98%  Weight: 182 lb (82.6 kg)     Physical Exam Constitutional:      Appearance: Normal appearance.  Cardiovascular:     Rate and Rhythm: Normal rate and regular rhythm.  Pulmonary:     Effort: Pulmonary  effort is normal.     Breath sounds: Normal breath sounds.  Abdominal:     General: Abdomen is flat. Bowel sounds are normal.     Palpations: Abdomen is soft.     Tenderness: There is no abdominal tenderness.  Skin:    General: Skin is warm and dry.  Neurological:     Mental Status: She is alert and oriented to person, place, and time. Mental status is at baseline.  Psychiatric:        Mood and Affect: Mood normal.        Behavior: Behavior normal.         Assessment & Plan    1. Situational stress  Will increase prozac to 40 mg daily and refill as below. Instructed this should be on the 4 dollar list at [facility] should she have to fill it without insurance.   I will place referral for CCM counseling services to consent her to the program and perform telephone visits as we are currently limiting our  office visits.  - FLUoxetine (PROZAC) 40 MG capsule; Take 1 capsule (40 mg total) by mouth daily.  Dispense: 90 capsule; Refill: 1  2. Diarrhea, unspecified type  Workup as below. Patient would like to see what labork shows before pursuing GI appointment. Counseled she may not be scheduled for GI in the next month due to COVID-19 restrictions. If sx persist, she will likely need to see GI.  - dicyclomine (BENTYL) 10 MG capsule; Take 1 capsule (10 mg total) by mouth 4 (four) times daily -  before meals and at bedtime for 30 days.  Dispense: 120 capsule; Refill: 0 - Comprehensive Metabolic Panel (CMET) - CBC with Differential - TSH - Sed Rate (ESR) - C-reactive protein - Tissue transglutaminase, IgA - IgA  3. Generalized abdominal pain  Bentyl as below for abdominal cramping and spasms.   - dicyclomine (BENTYL) 10 MG capsule; Take 1 capsule (10 mg total) by mouth 4 (four) times daily -  before meals and at bedtime for 30 days.  Dispense: 120 capsule; Refill: 0 - Comprehensive Metabolic Panel (CMET) - CBC with Differential - TSH - Sed Rate (ESR) - C-reactive protein - Tissue transglutaminase, IgA - IgA  The entirety of the information documented in the History of Present Illness, Review of Systems and Physical Exam were personally obtained by me. Portions of this information were initially documented by Meyer Cory, CMA and reviewed by me for thoroughness and accuracy.   F/u 1 year for anxiety and depression.         Trinna Post, PA-C  [facility]

## 2018-08-27 NOTE — Patient Instructions (Signed)

## 2018-08-28 ENCOUNTER — Ambulatory Visit: Payer: Self-pay

## 2018-08-28 DIAGNOSIS — F439 Reaction to severe stress, unspecified: Secondary | ICD-10-CM

## 2018-08-28 DIAGNOSIS — G4700 Insomnia, unspecified: Secondary | ICD-10-CM

## 2018-08-28 LAB — CBC WITH DIFFERENTIAL/PLATELET
Basophils Absolute: 0 10*3/uL (ref 0.0–0.2)
Basos: 1 %
EOS (ABSOLUTE): 0.1 10*3/uL (ref 0.0–0.4)
Eos: 1 %
Hematocrit: 39.8 % (ref 34.0–46.6)
Hemoglobin: 12.9 g/dL (ref 11.1–15.9)
Immature Grans (Abs): 0 10*3/uL (ref 0.0–0.1)
Immature Granulocytes: 0 %
Lymphocytes Absolute: 1.2 10*3/uL (ref 0.7–3.1)
Lymphs: 23 %
MCH: 29.3 pg (ref 26.6–33.0)
MCHC: 32.4 g/dL (ref 31.5–35.7)
MCV: 90 fL (ref 79–97)
Monocytes Absolute: 0.4 10*3/uL (ref 0.1–0.9)
Monocytes: 8 %
Neutrophils Absolute: 3.7 10*3/uL (ref 1.4–7.0)
Neutrophils: 67 %
Platelets: 293 10*3/uL (ref 150–450)
RBC: 4.41 x10E6/uL (ref 3.77–5.28)
RDW: 12.4 % (ref 11.7–15.4)
WBC: 5.4 10*3/uL (ref 3.4–10.8)

## 2018-08-28 LAB — C-REACTIVE PROTEIN: CRP: <1 mg/L (ref 0–10)

## 2018-08-28 LAB — COMPREHENSIVE METABOLIC PANEL
ALT: 18 IU/L (ref 0–32)
AST: 27 IU/L (ref 0–40)
Albumin/Globulin Ratio: 2.1 (ref 1.2–2.2)
Albumin: 4.6 g/dL (ref 3.8–4.8)
Alkaline Phosphatase: 76 IU/L (ref 39–117)
BUN/Creatinine Ratio: 16 (ref 9–23)
BUN: 16 mg/dL (ref 6–24)
Bilirubin Total: 0.3 mg/dL (ref 0.0–1.2)
CO2: 21 mmol/L (ref 20–29)
Calcium: 9.1 mg/dL (ref 8.7–10.2)
Chloride: 105 mmol/L (ref 96–106)
Creatinine, Ser: 0.97 mg/dL (ref 0.57–1.00)
GFR calc Af Amer: 84 mL/min/{1.73_m2} (ref >59)
GFR calc non Af Amer: 73 mL/min/{1.73_m2} (ref >59)
Globulin, Total: 2.2 g/dL (ref 1.5–4.5)
Glucose: 86 mg/dL (ref 65–99)
Potassium: 4.7 mmol/L (ref 3.5–5.2)
Sodium: 141 mmol/L (ref 134–144)
Total Protein: 6.8 g/dL (ref 6.0–8.5)

## 2018-08-28 LAB — TISSUE TRANSGLUTAMINASE, IGA: Transglutaminase IgA: <2 U/mL (ref 0–3)

## 2018-08-28 LAB — SEDIMENTATION RATE: Sed Rate: 3 mm/hr (ref 0–32)

## 2018-08-28 LAB — TSH: TSH: 0.994 u[IU]/mL (ref 0.450–4.500)

## 2018-08-28 LAB — IGA: IgA/Immunoglobulin A, Serum: 173 mg/dL (ref 87–352)

## 2018-08-28 NOTE — Chronic Care Management (AMB) (Signed)
  Care Management   Note  08/28/2018 Name: [name] MRN: [id number] DOB: 06-17-1975  [name] is a 43 year old female who sees Osvaldo Angst PA-C for primary care. Adriana asked the CCM team to consult the patient for care coordination and case management specifically related to need for Social Work counseling secondary to situational stress. Patient has a history of but not limited to migraine headaches. Referral was placed 08/28/2018. Patient's last office visit was yesterday  Telephone outreach to patient today to introduce CCM services.  Ms. Hosaka was given information about Care Management services today including:  1. Case Management services include personalized support from designated clinical staff supervised by a physician, including individualized plan of care and coordination with other care providers 2. 24/7 contact phone numbers for assistance for urgent and routine care needs. 3. The patient may stop CCM services at any time (effective at the end of the month) by phone call to the office staff.   Patient did not agree to services and wishes to consider information provided before deciding about enrollment in CCM services.     Plan: Will follow up again with patient next week as requested.  Kensington Rios E. Suzie Portela, RN, BSN Nurse Care Coordinator [facility] Care Management [phone]

## 2018-09-07 ENCOUNTER — Ambulatory Visit: Payer: Self-pay | Admitting: Pharmacist

## 2018-09-07 DIAGNOSIS — F439 Reaction to severe stress, unspecified: Secondary | ICD-10-CM

## 2018-09-07 NOTE — Chronic Care Management (AMB) (Signed)
  Care Management   Note  09/07/2018 Name: [name] MRN: [id number] DOB: 04/26/1976  43 y.o. year old female referred to Care Management by Osvaldo Angst- PAC for situational anxiety and LCSW services.  Last office visit with Trey Sailors, PA-C was 08/27/18, initial outreach by Yvone Neu, RNCM, on 08/28/18.   Was unable to reach patient via telephone today and have left HIPAA compliant voicemail asking patient to return my call. (unsuccessful outreach #1).  Karalee Height, PharmD Clinical Pharmacist [facility] Practice/[facility] [phone]

## 2018-09-18 ENCOUNTER — Ambulatory Visit: Payer: Self-pay

## 2018-09-18 DIAGNOSIS — G4700 Insomnia, unspecified: Secondary | ICD-10-CM

## 2018-09-18 DIAGNOSIS — F439 Reaction to severe stress, unspecified: Secondary | ICD-10-CM

## 2018-09-18 NOTE — Chronic Care Management (AMB) (Signed)
  Care Management   Note  09/18/2018 Name: [name] MRN: [id number] DOB: 10/29/1975  [name] is a 43 year old female who sees Osvaldo Angst PA-C for primary care. [name] the CCM team to consult the patient for care coordination and case management specifically related to need for Social Work counseling secondary to situational stress. Patient has a history of but not limited to migraine headaches. Referral was placed3/27/2020. Patient's last office visit was yesterday. CCM Service introduction completed on 08/28/2018. Patient wished to think about services at that time.  Today, CCM RN CM followed up with [name] for further discussion about CCM engagement.  [name] was given information again about Care Management services today including:  1. Case Management services include personalized support from designated clinical staff supervised by a physician, including individualized plan of care and coordination with other care providers 2. 24/7 contact phone numbers for assistance for urgent and routine care needs. 3. The patient may stop CCM services at any time (effective at the end of the month) by phone call to the office staff.   Patient did not agree to services and does not wish to consider at this time.  She has been provided CCM Contact information and encouraged to call if her mind changed.  Plan: Referral closed  Deigo Alonso E. Suzie Portela, RN, BSN Nurse Care Coordinator [facility] Practice/[facility] Care Management [phone]

## 2019-01-09 ENCOUNTER — Other Ambulatory Visit: Payer: Self-pay | Admitting: Physician Assistant

## 2019-01-09 DIAGNOSIS — F439 Reaction to severe stress, unspecified: Secondary | ICD-10-CM

## 2019-01-11 ENCOUNTER — Other Ambulatory Visit: Payer: Self-pay | Admitting: Physician Assistant

## 2019-01-11 DIAGNOSIS — F439 Reaction to severe stress, unspecified: Secondary | ICD-10-CM

## 2019-01-11 MED ORDER — FLUOXETINE HCL 40 MG PO CAPS: 40.0000 mg | ORAL_CAPSULE | Freq: Every day | ORAL | 1 refills | Status: DC

## 2019-01-11 MED ORDER — TRAZODONE HCL 50 MG PO TABS: ORAL_TABLET | ORAL | 1 refills | Status: DC

## 2019-01-11 NOTE — Telephone Encounter (Signed)
[facility] faxed refill request for the following medications:  traZODone (DESYREL) 50 MG tablet   FLUoxetine (PROZAC) 40 MG capsule     Please advise.

## 2019-01-11 NOTE — Telephone Encounter (Signed)
Please review. Thanks!  

## 2019-03-19 ENCOUNTER — Other Ambulatory Visit: Payer: Self-pay

## 2019-03-19 ENCOUNTER — Telehealth: Payer: Self-pay

## 2019-03-19 DIAGNOSIS — Z20828 Contact with and (suspected) exposure to other viral communicable diseases: Secondary | ICD-10-CM | POA: Diagnosis not present

## 2019-03-19 DIAGNOSIS — Z20822 Contact with and (suspected) exposure to covid-19: Secondary | ICD-10-CM

## 2019-03-19 NOTE — Telephone Encounter (Signed)
Agree 

## 2019-03-19 NOTE — Telephone Encounter (Signed)
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  He tested positive for covid last night.  Patient wants testing.  Have put order in for test.  Discuss quarantine and advised patient of test site protocol.  Advised we will call with results.

## 2019-03-21 LAB — NOVEL CORONAVIRUS, NAA: SARS-CoV-2, NAA: NOT DETECTED

## 2019-04-06 ENCOUNTER — Other Ambulatory Visit: Payer: Self-pay

## 2019-04-06 ENCOUNTER — Telehealth (INDEPENDENT_AMBULATORY_CARE_PROVIDER_SITE_OTHER): Payer: BC Managed Care – PPO | Admitting: Physician Assistant

## 2019-04-06 DIAGNOSIS — R05 Cough: Secondary | ICD-10-CM

## 2019-04-06 DIAGNOSIS — M791 Myalgia, unspecified site: Secondary | ICD-10-CM | POA: Diagnosis not present

## 2019-04-06 DIAGNOSIS — R059 Cough, unspecified: Secondary | ICD-10-CM

## 2019-04-06 DIAGNOSIS — R5383 Other fatigue: Secondary | ICD-10-CM

## 2019-04-06 DIAGNOSIS — Z20822 Contact with and (suspected) exposure to covid-19: Secondary | ICD-10-CM

## 2019-04-06 MED ORDER — PROMETHAZINE-DM 6.25-15 MG/5ML PO SYRP: 5.0000 mL | ORAL_SOLUTION | Freq: Every evening | ORAL | 0 refills | Status: DC | PRN

## 2019-04-06 NOTE — Progress Notes (Signed)
Patient: [name] Female    DOB: 1975-09-02   43 y.o.   MRN: [id number] Visit Date: 04/06/2019  Today's Provider: Trinna Post, PA-C   Chief Complaint  Patient presents with  . URI   Subjective:    I, [name] CMA, am acting as a Education administrator for [employer], PA-C.   Virtual Visit via Video Note  I connected with [name] on 04/06/19 at  3:40 PM EST by a video enabled telemedicine application and verified that I am speaking with the correct person using two identifiers.  Location: Patient: Home Provider: Office   Interactive audio and video communications were attempted, although failed due to patient's inability to connect to video. Continued visit with audio only interaction with patient agreement.   I discussed the limitations of evaluation and management by telemedicine and the availability of in person appointments. The patient expressed understanding and agreed to proceed.  URI  This is a new problem. The current episode started yesterday. The problem has been gradually worsening. The maximum temperature recorded prior to her arrival was 100.4 - 100.9 F. Associated symptoms include congestion, coughing, headaches, sneezing and a sore throat. Pertinent negatives include no chest pain, sinus pain or wheezing.   Patient states her fever has went down to 99.1. Patient states that her grandfather passed away from COVID-65 on 04/27/19 and she was his caregiver but she wasn't around him for 3 weeks a least per patient.  She is currently working. Husband was sick last week but did not get tested - same as hers. Reports her husband is still not feeling great.   She reports she got Nyquil and alka seltzer plus.   Allergies  Allergen Reactions  . Codeine Nausea And Vomiting  . Prednisolone     Other reaction(s): Dizziness, Irregular heart rate  . Sulfa Antibiotics Rash  . Vicodin [Hydrocodone-Acetaminophen] Nausea Only    And itching all over      Current Outpatient Medications:  .  FLUoxetine (PROZAC) 40 MG capsule, Take 1 capsule (40 mg total) by mouth daily., Disp: 90 capsule, Rfl: 1 .  traZODone (DESYREL) 50 MG tablet, TAKE 1 TO 2 TABLETS BY MOUTH IN THE EVENING AS NEEDED FOR INSOMNIA., Disp: 180 tablet, Rfl: 1 .  dicyclomine (BENTYL) 10 MG capsule, Take 1 capsule (10 mg total) by mouth 4 (four) times daily -  before meals and at bedtime for 30 days., Disp: 120 capsule, Rfl: 0  Review of Systems  Constitutional: Positive for chills, fatigue and fever.  HENT: Positive for congestion, sneezing and sore throat. Negative for sinus pressure and sinus pain.   Respiratory: Positive for cough. Negative for chest tightness, shortness of breath and wheezing.   Cardiovascular: Negative for chest pain.  Gastrointestinal: Negative.   Endocrine: Negative.   Neurological: Positive for weakness and headaches.    Social History   Tobacco Use  . Smoking status: Former Smoker    Packs/day: 0.50    Years: 20.00    Pack years: 10.00    Types: Cigarettes    Quit date: 05/30/2018    Years since quitting: 0.8  . Smokeless tobacco: Never Used  Substance Use Topics  . Alcohol use: No    Alcohol/week: 0.0 standard drinks      Objective:   There were no vitals taken for this visit. There were no vitals filed for this visit.There is no height or weight on file to calculate BMI.   Physical Exam Constitutional:  General: She is not in acute distress.    Appearance: Normal appearance. She is ill-appearing. She is not diaphoretic.  Neurological:     Mental Status: She is alert.  Psychiatric:        Mood and Affect: Mood normal.        Behavior: Behavior normal.      No results found for any visits on 04/06/19.     Assessment & Plan  1. Cough  Will send in cough medication as below. May take mucinex. Counseled on course of virus ongoing for 10 days and course of recovery. Return precautions counseled. Isolation precautions  counseled. Wrote work note and sent through [employer].   - promethazine-dextromethorphan (PROMETHAZINE-DM) 6.25-15 MG/5ML syrup; Take 5 mLs by mouth at bedtime as needed.  Dispense: 118 mL; Refill: 0  2. Myalgia   3. Other fatigue  The entirety of the information documented in the History of Present Illness, Review of Systems and Physical Exam were personally obtained by me. Portions of this information were initially documented by [facility], CMA and reviewed by me for thoroughness and accuracy.   F/u PRN    Trey Sailors, PA-C  [facility]

## 2019-04-07 LAB — NOVEL CORONAVIRUS, NAA: SARS-CoV-2, NAA: DETECTED — AB

## 2019-05-06 ENCOUNTER — Ambulatory Visit (INDEPENDENT_AMBULATORY_CARE_PROVIDER_SITE_OTHER): Payer: BC Managed Care – PPO | Admitting: Physician Assistant

## 2019-05-06 DIAGNOSIS — J0110 Acute frontal sinusitis, unspecified: Secondary | ICD-10-CM | POA: Diagnosis not present

## 2019-05-06 DIAGNOSIS — Z720 Tobacco use: Secondary | ICD-10-CM

## 2019-05-06 MED ORDER — FLUCONAZOLE 150 MG PO TABS: ORAL_TABLET | ORAL | 0 refills | Status: DC

## 2019-05-06 MED ORDER — DOXYCYCLINE HYCLATE 100 MG PO TABS: 100.0000 mg | ORAL_TABLET | Freq: Two times a day (BID) | ORAL | 0 refills | Status: AC

## 2019-05-06 MED ORDER — CHANTIX STARTING MONTH PAK 0.5 MG X 11 & 1 MG X 42 PO TABS: ORAL_TABLET | ORAL | 0 refills | Status: DC

## 2019-05-06 NOTE — Progress Notes (Signed)
   Subjective:    Patient ID: [name], female    DOB: 05-28-1976, 43 y.o.   MRN: [id number]  [name] is a 43 y.o. female presenting on 05/06/2019 for Nasal Congestion  Virtual Visit via Telephone Note  I connected with Sari Cogan on 05/07/19 at  4:00 PM EST by telephone and verified that I am speaking with the correct person using two identifiers.   I discussed the limitations, risks, security and privacy concerns of performing an evaluation and management service by telephone and the availability of in person appointments. I also discussed with the patient that there may be a patient responsible charge related to this service. The patient expressed understanding and agreed to proceed.   HPI   Patient was diagnosed with COVID on 04/06/2019. She felt like she had a bad head cold with fatigue at the time. She reports muscle aches at the time. She reports her head still feels congested. She had a low grade fever. Reports coughing some phlegm up. Reports she did have a period where she was getting better and then got worse after that point.   She is taking keflex for a UTI from an MD live visit she did yesterday. She is needing a work note.   She took a course of Chantix earlier this year which helped her quit smoking for a period however she has begun smoking again. Would like to try Chantix again.   Social History   Tobacco Use  . Smoking status: Former Smoker    Packs/day: 0.50    Years: 20.00    Pack years: 10.00    Types: Cigarettes    Quit date: 05/30/2018    Years since quitting: 0.9  . Smokeless tobacco: Never Used  Substance Use Topics  . Alcohol use: No    Alcohol/week: 0.0 standard drinks  . Drug use: No    Review of Systems Per HPI unless specifically indicated above     Objective:    There were no vitals taken for this visit.  Wt Readings from Last 3 Encounters:  08/27/18 182 lb (82.6 kg)  07/27/18 190 lb (86.2 kg)  07/08/18 189 lb (85.7 kg)   Physical Exam Results for orders placed or performed in visit on 04/06/19  Novel Coronavirus, NAA ([facility])   Specimen: Nasopharyngeal(NP) swabs in vial transport medium   NASOPHARYNGE  TESTING  Result Value Ref Range   SARS-CoV-2, NAA Detected (A) Not Detected      Assessment & Plan:   1. Acute non-recurrent frontal sinusitis  - doxycycline (VIBRA-TABS) 100 MG tablet; Take 1 tablet (100 mg total) by mouth 2 (two) times daily for 7 days.  Dispense: 14 tablet; Refill: 0 - fluconazole (DIFLUCAN) 150 MG tablet; Take 1 tablet on day 1 and then second tablet three days later if still having symptoms.  Dispense: 2 tablet; Refill: 0  2. Tobacco abuse  - varenicline (CHANTIX STARTING MONTH PAK) 0.5 MG X 11 & 1 MG X 42 tablet; Take one 0.5 mg tablet by mouth once daily for 3 days, then increase to one 0.5 mg tablet twice daily for 4 days, then increase to one 1 mg tablet twice daily.  Dispense: 53 tablet; Refill: 0   Follow up plan: Return if symptoms worsen or fail to improve.  Carles Collet, PA-C [facility] 05/07/2019, 9:54 AM

## 2019-05-25 ENCOUNTER — Ambulatory Visit (INDEPENDENT_AMBULATORY_CARE_PROVIDER_SITE_OTHER): Payer: BC Managed Care – PPO | Admitting: Physician Assistant

## 2019-05-25 ENCOUNTER — Ambulatory Visit
Admission: RE | Admit: 2019-05-25 | Discharge: 2019-05-25 | Disposition: A | Discharge status: Home / Self Care | Payer: BC Managed Care – PPO | Source: Ambulatory Visit | Attending: Physician Assistant | Admitting: Physician Assistant

## 2019-05-25 ENCOUNTER — Encounter: Payer: Self-pay | Admitting: Physician Assistant

## 2019-05-25 VITALS — Temp 98.9°F

## 2019-05-25 DIAGNOSIS — R300 Dysuria: Secondary | ICD-10-CM | POA: Diagnosis not present

## 2019-05-25 DIAGNOSIS — M25562 Pain in left knee: Secondary | ICD-10-CM | POA: Diagnosis not present

## 2019-05-25 DIAGNOSIS — S8992XA Unspecified injury of left lower leg, initial encounter: Secondary | ICD-10-CM | POA: Diagnosis not present

## 2019-05-25 DIAGNOSIS — J0110 Acute frontal sinusitis, unspecified: Secondary | ICD-10-CM

## 2019-05-25 MED ORDER — AMOXICILLIN-POT CLAVULANATE 875-125 MG PO TABS: 1.0000 | ORAL_TABLET | Freq: Two times a day (BID) | ORAL | 0 refills | Status: AC

## 2019-05-25 MED ORDER — PREDNISONE 20 MG PO TABS: 20.0000 mg | ORAL_TABLET | Freq: Every day | ORAL | 0 refills | Status: AC

## 2019-05-25 NOTE — Progress Notes (Signed)
Patient: [name] Female    DOB: 10-31-1975   43 y.o.   MRN: [id number] Visit Date: 05/25/2019  Today's Provider: Trinna Post, PA-C   Chief Complaint  Patient presents with  . URI   Subjective:    Virtual Visit via Video Note  I connected with [name] on 05/25/19 at 11:20 AM EST by a video enabled telemedicine application and verified that I am speaking with the correct person using two identifiers.  Location: Patient: home Provider: office   I discussed the limitations of evaluation and management by telemedicine and the availability of in person appointments. The patient expressed understanding and agreed to proceed.    URI  Associated symptoms include a sore throat. Pertinent negatives include no abdominal pain, chest pain, nausea or vomiting.   Had COVID early November and is now presenting with worsening facial pressure and sinus congestion. Treated for sinusitis on 05/06/2019 with doxycycline 100 mg BID x 7 days and got better but then got worse again. Was also taking keflex for UTI prescribed by a teledoc through employer. Reports she is having UTI symptoms again.   She additionally fell several weeks ago directly onto both knees and reports since then her left knee is painful and makes a crunching noise. She is able to walk.   Allergies  Allergen Reactions  . Codeine Nausea And Vomiting  . Prednisolone     Other reaction(s): Dizziness, Irregular heart rate  . Sulfa Antibiotics Rash  . Vicodin [Hydrocodone-Acetaminophen] Nausea Only    And itching all over     Current Outpatient Medications:  .  FLUoxetine (PROZAC) 40 MG capsule, Take 1 capsule (40 mg total) by mouth daily., Disp: 90 capsule, Rfl: 1 .  traZODone (DESYREL) 50 MG tablet, TAKE 1 TO 2 TABLETS BY MOUTH IN THE EVENING AS NEEDED FOR INSOMNIA., Disp: 180 tablet, Rfl: 1 .  dicyclomine (BENTYL) 10 MG capsule, Take 1 capsule (10 mg total) by mouth 4 (four) times daily -  before meals  and at bedtime for 30 days. (Patient not taking: Reported on 05/25/2019), Disp: 120 capsule, Rfl: 0 .  fluconazole (DIFLUCAN) 150 MG tablet, Take 1 tablet on day 1 and then second tablet three days later if still having symptoms. (Patient not taking: Reported on 05/25/2019), Disp: 2 tablet, Rfl: 0 .  promethazine-dextromethorphan (PROMETHAZINE-DM) 6.25-15 MG/5ML syrup, Take 5 mLs by mouth at bedtime as needed. (Patient not taking: Reported on 05/25/2019), Disp: 118 mL, Rfl: 0 .  varenicline (CHANTIX STARTING MONTH PAK) 0.5 MG X 11 & 1 MG X 42 tablet, Take one 0.5 mg tablet by mouth once daily for 3 days, then increase to one 0.5 mg tablet twice daily for 4 days, then increase to one 1 mg tablet twice daily. (Patient not taking: Reported on 05/25/2019), Disp: 53 tablet, Rfl: 0  Review of Systems  Constitutional: Negative for appetite change, chills, fatigue and fever.  HENT: Positive for sore throat.   Respiratory: Positive for shortness of breath. Negative for chest tightness.   Cardiovascular: Negative for chest pain and palpitations.  Gastrointestinal: Negative for abdominal pain, nausea and vomiting.  Neurological: Negative for dizziness and weakness.    Social History   Tobacco Use  . Smoking status: Former Smoker    Packs/day: 0.50    Years: 20.00    Pack years: 10.00    Types: Cigarettes    Quit date: 05/30/2018    Years since quitting: 0.9  . Smokeless tobacco:  Never Used  Substance Use Topics  . Alcohol use: No    Alcohol/week: 0.0 standard drinks      Objective:   Temp 98.9 F (37.2 C) (Oral)  Vitals:   05/25/19 1056  Temp: 98.9 F (37.2 C)  TempSrc: Oral  There is no height or weight on file to calculate BMI.   Physical Exam Constitutional:      Appearance: Normal appearance.  Cardiovascular:     Pulses: Normal pulses.  Pulmonary:     Effort: Pulmonary effort is normal. No respiratory distress.  Neurological:     Mental Status: She is alert and oriented to  person, place, and time. Mental status is at baseline.  Psychiatric:        Mood and Affect: Mood normal.        Behavior: Behavior normal.      No results found for any visits on 05/25/19.     Assessment & Plan    1. Acute non-recurrent frontal sinusitis   - amoxicillin-clavulanate (AUGMENTIN) 875-125 MG tablet; Take 1 tablet by mouth 2 (two) times daily for 10 days.  Dispense: 20 tablet; Refill: 0 - predniSONE (DELTASONE) 20 MG tablet; Take 1 tablet (20 mg total) by mouth daily with breakfast for 5 days.  Dispense: 5 tablet; Refill: 0  2. Acute pain of left knee  - DG Knee Complete 4 Views Left; Future  3. Dysuria  Covered by Augmentin. If symptoms persist, will need sample.   I discussed the assessment and treatment plan with the patient. The patient was provided an opportunity to ask questions and all were answered. The patient agreed with the plan and demonstrated an understanding of the instructions.   The patient was advised to call back or seek an in-person evaluation if the symptoms worsen or if the condition fails to improve as anticipated.      Trey Sailors, PA-C  [facility]

## 2019-07-31 ENCOUNTER — Other Ambulatory Visit: Payer: Self-pay | Admitting: Physician Assistant

## 2019-07-31 DIAGNOSIS — F439 Reaction to severe stress, unspecified: Secondary | ICD-10-CM

## 2019-08-01 NOTE — Telephone Encounter (Signed)
Requested Prescriptions  Pending Prescriptions Disp Refills  . FLUoxetine (PROZAC) 40 MG capsule [Pharmacy Med Name: FLUoxetine HCl 40 MG Oral Capsule] 90 capsule 0    Sig: Take 1 capsule by mouth once daily     Psychiatry:  Antidepressants - SSRI Passed - 07/31/2019  3:46 PM      Passed - Valid encounter within last 6 months    Recent Outpatient Visits          2 months ago Acute non-recurrent frontal sinusitis   [facility] [place], Adriana M, PA-C   2 months ago Acute non-recurrent frontal sinusitis   [facility] [place] Angst M, [place]   3 months ago Cough   [facility] Os[place]do Angst M, [place]   11 months ago Diarrhea, unspecified type   [facility] [place], [place]ella [place], [place]   1 year ago Dizziness   [facility] [place], [place], [place]             . traZODone (DESYREL) 50 MG tablet [Pharmacy Med Name: TRAZODONE 50MG       TAB] 180 tablet 0    Sig: TAKE 1 TO 2 TABLETS BY MOUTH IN THE EVENING AS NEEDED FOR INSOMNIA     Psychiatry: Antidepressants - Serotonin Modulator Passed - 07/31/2019  3:46 PM      Passed - Valid encounter within last 6 months    Recent Outpatient Visits          2 months ago Acute non-recurrent frontal sinusitis   [facility] [place], [place] M, M   2 months ago Acute non-recurrent frontal sinusitis   [facility] [place], [place], [place]   3 months ago Cough   [facility] M, M   11 months ago Diarrhea, unspecified type   [facility] [place], [place], Lavella [place]   1 year ago Dizziness   [facility] [place], [place]ESSHEIM

## 2019-09-06 ENCOUNTER — Encounter: Payer: Self-pay | Admitting: Emergency Medicine

## 2019-09-06 ENCOUNTER — Emergency Department
Admission: EM | Admit: 2019-09-06 | Discharge: 2019-09-06 | Disposition: A | Discharge status: Home / Self Care | Payer: BC Managed Care – PPO | Attending: Emergency Medicine | Admitting: Emergency Medicine

## 2019-09-06 ENCOUNTER — Telehealth (INDEPENDENT_AMBULATORY_CARE_PROVIDER_SITE_OTHER): Payer: BC Managed Care – PPO | Admitting: Physician Assistant

## 2019-09-06 ENCOUNTER — Emergency Department: Payer: BC Managed Care – PPO

## 2019-09-06 ENCOUNTER — Other Ambulatory Visit: Payer: Self-pay

## 2019-09-06 DIAGNOSIS — J020 Streptococcal pharyngitis: Secondary | ICD-10-CM | POA: Diagnosis not present

## 2019-09-06 DIAGNOSIS — J029 Acute pharyngitis, unspecified: Secondary | ICD-10-CM

## 2019-09-06 DIAGNOSIS — R509 Fever, unspecified: Secondary | ICD-10-CM

## 2019-09-06 DIAGNOSIS — R1310 Dysphagia, unspecified: Secondary | ICD-10-CM | POA: Diagnosis not present

## 2019-09-06 DIAGNOSIS — J039 Acute tonsillitis, unspecified: Secondary | ICD-10-CM | POA: Diagnosis not present

## 2019-09-06 LAB — BASIC METABOLIC PANEL
Anion gap: 10 (ref 5–15)
BUN: 10 mg/dL (ref 6–20)
CO2: 24 mmol/L (ref 22–32)
Calcium: 8.6 mg/dL — ABNORMAL LOW (ref 8.9–10.3)
Chloride: 102 mmol/L (ref 98–111)
Creatinine, Ser: 0.75 mg/dL (ref 0.44–1.00)
GFR calc Af Amer: >60 mL/min (ref >60)
GFR calc non Af Amer: >60 mL/min (ref >60)
Glucose, Bld: 117 mg/dL — ABNORMAL HIGH (ref 70–99)
Potassium: 3.7 mmol/L (ref 3.5–5.1)
Sodium: 136 mmol/L (ref 135–145)

## 2019-09-06 LAB — CBC WITH DIFFERENTIAL/PLATELET
Abs Immature Granulocytes: 0.05 10*3/uL (ref 0.00–0.07)
Basophils Absolute: 0 10*3/uL (ref 0.0–0.1)
Basophils Relative: 0 %
Eosinophils Absolute: 0 10*3/uL (ref 0.0–0.5)
Eosinophils Relative: 0 %
HCT: 41.7 % (ref 36.0–46.0)
Hemoglobin: 13.8 g/dL (ref 12.0–15.0)
Immature Granulocytes: 0 %
Lymphocytes Relative: 11 %
Lymphs Abs: 1.4 10*3/uL (ref 0.7–4.0)
MCH: 31.4 pg (ref 26.0–34.0)
MCHC: 33.1 g/dL (ref 30.0–36.0)
MCV: 94.8 fL (ref 80.0–100.0)
Monocytes Absolute: 1.2 10*3/uL — ABNORMAL HIGH (ref 0.1–1.0)
Monocytes Relative: 9 %
Neutro Abs: 10.3 10*3/uL — ABNORMAL HIGH (ref 1.7–7.7)
Neutrophils Relative %: 80 %
Platelets: 245 10*3/uL (ref 150–400)
RBC: 4.4 MIL/uL (ref 3.87–5.11)
RDW: 13 % (ref 11.5–15.5)
WBC: 13.1 10*3/uL — ABNORMAL HIGH (ref 4.0–10.5)
nRBC: 0 % (ref 0.0–0.2)

## 2019-09-06 LAB — GROUP A STREP BY PCR: Group A Strep by PCR: DETECTED — AB

## 2019-09-06 MED ORDER — ONDANSETRON HCL 4 MG/2ML IJ SOLN
4.0000 mg | Freq: Once | INTRAMUSCULAR | Status: AC
  Administered 2019-09-06: 4 mg via INTRAVENOUS
  Filled 2019-09-06: qty 2

## 2019-09-06 MED ORDER — DEXAMETHASONE SODIUM PHOSPHATE 10 MG/ML IJ SOLN
10.0000 mg | Freq: Once | INTRAMUSCULAR | Status: AC
  Administered 2019-09-06: 10 mg via INTRAMUSCULAR
  Filled 2019-09-06: qty 1

## 2019-09-06 MED ORDER — BENADRYL ALLERGY CHILDRENS 12.5-5 MG/5ML PO SOLN: 25.0000 mg | Freq: Four times a day (QID) | ORAL | 0 refills | Status: DC

## 2019-09-06 MED ORDER — METHYLPREDNISOLONE 4 MG PO TBPK: ORAL_TABLET | ORAL | 0 refills | Status: DC

## 2019-09-06 MED ORDER — IOHEXOL 300 MG/ML  SOLN
75.0000 mL | Freq: Once | INTRAMUSCULAR | Status: AC | PRN
  Administered 2019-09-06: 19:00:00 75 mL via INTRAVENOUS
  Filled 2019-09-06: qty 75

## 2019-09-06 MED ORDER — PENICILLIN G BENZATHINE & PROC 1200000 UNIT/2ML IM SUSP
2.4000 10*6.[IU] | Freq: Once | INTRAMUSCULAR | Status: AC
  Administered 2019-09-06: 2.4 10*6.[IU] via INTRAMUSCULAR
  Filled 2019-09-06: qty 4

## 2019-09-06 MED ORDER — MORPHINE SULFATE (PF) 4 MG/ML IV SOLN
4.0000 mg | Freq: Once | INTRAVENOUS | Status: AC
  Administered 2019-09-06: 4 mg via INTRAVENOUS
  Filled 2019-09-06: qty 1

## 2019-09-06 MED ORDER — DIPHENHYDRAMINE HCL 12.5 MG/5ML PO ELIX
25.0000 mg | ORAL_SOLUTION | Freq: Once | ORAL | Status: AC
  Administered 2019-09-06: 25 mg via ORAL
  Filled 2019-09-06: qty 10

## 2019-09-06 MED ORDER — LIDOCAINE VISCOUS HCL 2 % MT SOLN
15.0000 mL | Freq: Once | OROMUCOSAL | Status: AC
  Administered 2019-09-06: 15 mL via ORAL
  Filled 2019-09-06: qty 15

## 2019-09-06 MED ORDER — LIDOCAINE VISCOUS HCL 2 % MT SOLN: 5.0000 mL | Freq: Four times a day (QID) | OROMUCOSAL | 0 refills | Status: DC | PRN

## 2019-09-06 NOTE — Discharge Instructions (Signed)
Take medication as directed.

## 2019-09-06 NOTE — ED Triage Notes (Addendum)
Pt presents to ED via POV with c/o sore throat and fever. Pt states pain radiates up to her ear and down her neck on her L side. Pt states pain is affecting her speech. Pt states is unable to swallow her own saliva however is noted to be carrying a cup with a drink in it. Pt also noted to be able to maintain her own secretions at this time.

## 2019-09-06 NOTE — ED Notes (Signed)
See triage note  Presents with sore throat  States she did have temp at home yesterday of 102  Today  States pain is worse nad having increased diff in swallowing  And low grade temp today

## 2019-09-06 NOTE — Progress Notes (Signed)
Patient: [name] Female    DOB: Jun 02, 1976   44 y.o.   MRN: [id number] Visit Date: 09/06/2019  Today's Provider: Trinna Post, PA-C   Chief Complaint  Patient presents with  . Sore Throat   Subjective:    I, [name],CMA am acting as a Education administrator for [employer].  Virtual Visit via Video Note  I connected with [name] on 09/06/19 at  4:00 PM EDT by a video enabled telemedicine application and verified that I am speaking with the correct person using two identifiers.  Location: Patient: Home Provider: Office    I discussed the limitations of evaluation and management by telemedicine and the availability of in person appointments. The patient expressed understanding and agreed to proceed.  Sore Throat  This is a new problem. The current episode started yesterday. The problem has been gradually worsening. The maximum temperature recorded prior to her arrival was 100.4 - 100.9 F. The fever has been present for 1 to 2 days. The pain is at a severity of 9/10. The pain is severe. Associated symptoms include ear pain, headaches, a hoarse voice and trouble swallowing. Pertinent negatives include no congestion, coughing, diarrhea or shortness of breath. She has tried NSAIDs for the symptoms. The treatment provided mild relief.   Patient reports severe sore throat. She woke up like this yesterday. She describes fever with temperature of 102.5. She is experiencing one sided swelling worse than the other. She reports having difficulty swallowing to the point she cannot swallow her own saliva. She reports she is having difficulty speaking as well.   Allergies  Allergen Reactions  . Codeine Nausea And Vomiting  . Prednisolone     Other reaction(s): Dizziness, Irregular heart rate  . Sulfa Antibiotics Rash  . Vicodin [Hydrocodone-Acetaminophen] Nausea Only    And itching all over     Current Outpatient Medications:  .  dicyclomine (BENTYL) 10 MG capsule,  Take 1 capsule (10 mg total) by mouth 4 (four) times daily -  before meals and at bedtime for 30 days. (Patient not taking: Reported on 05/25/2019), Disp: 120 capsule, Rfl: 0 .  fluconazole (DIFLUCAN) 150 MG tablet, Take 1 tablet on day 1 and then second tablet three days later if still having symptoms. (Patient not taking: Reported on 05/25/2019), Disp: 2 tablet, Rfl: 0 .  FLUoxetine (PROZAC) 40 MG capsule, Take 1 capsule by mouth once daily, Disp: 90 capsule, Rfl: 0 .  promethazine-dextromethorphan (PROMETHAZINE-DM) 6.25-15 MG/5ML syrup, Take 5 mLs by mouth at bedtime as needed. (Patient not taking: Reported on 05/25/2019), Disp: 118 mL, Rfl: 0 .  traZODone (DESYREL) 50 MG tablet, TAKE 1 TO 2 TABLETS BY MOUTH IN THE EVENING AS NEEDED FOR INSOMNIA, Disp: 180 tablet, Rfl: 0 .  varenicline (CHANTIX STARTING MONTH PAK) 0.5 MG X 11 & 1 MG X 42 tablet, Take one 0.5 mg tablet by mouth once daily for 3 days, then increase to one 0.5 mg tablet twice daily for 4 days, then increase to one 1 mg tablet twice daily. (Patient not taking: Reported on 05/25/2019), Disp: 53 tablet, Rfl: 0  Review of Systems  Constitutional: Positive for chills, fatigue and fever (100.8-102.1 per pt).  HENT: Positive for ear pain, hoarse voice, sore throat and trouble swallowing. Negative for congestion, postnasal drip, rhinorrhea, sinus pressure, sinus pain and sneezing.   Respiratory: Negative.  Negative for cough, chest tightness, shortness of breath and wheezing.   Cardiovascular: Negative for chest pain.  Gastrointestinal:  Negative.  Negative for diarrhea and nausea.  Neurological: Positive for headaches.    Social History   Tobacco Use  . Smoking status: Former Smoker    Packs/day: 0.50    Years: 20.00    Pack years: 10.00    Types: Cigarettes    Quit date: 05/30/2018    Years since quitting: 1.2  . Smokeless tobacco: Never Used  Substance Use Topics  . Alcohol use: No    Alcohol/week: 0.0 standard drinks       Objective:   There were no vitals taken for this visit. There were no vitals filed for this visit.There is no height or weight on file to calculate BMI.   Physical Exam Constitutional:      General: She is not in acute distress.    Appearance: She is well-developed. She is ill-appearing.  HENT:     Mouth/Throat:     Comments: Voice has a muffled quality to it.  Neurological:     Mental Status: She is alert.      No results found for any visits on 09/06/19.     Assessment & Plan    1. Sore throat  Patient appears very ill on video visit. Her voice is noticeably changed. She appears to be having difficulty swallowing. I am concerned about possible peritonsillar abscess and I have recommended she be seen in the emergency room. Patient is agreeable and she will head over to [facility]. I have called ahead to let them know of her arrival.   2. Fever, unspecified fever cause  I discussed the assessment and treatment plan with the patient. The patient was provided an opportunity to ask questions and all were answered. The patient agreed with the plan and demonstrated an understanding of the instructions.   The patient was advised to call back or seek an in-person evaluation if the symptoms worsen or if the condition fails to improve as anticipated.  I provided 15 minutes of non-face-to-face time during this encounter.     Trey Sailors, PA-C  [facility]

## 2019-09-06 NOTE — ED Provider Notes (Signed)
[facility] Emergency Department Provider Note   ____________________________________________   First MD Initiated Contact with Patient 09/06/19 1726     (approximate)  I have reviewed the triage vital signs and the nursing notes.   HISTORY  Chief Complaint Sore Throat    HPI [name] is a 44 y.o. female patient presents with acute onset of sore throat and fever.  Patient the pain radiates from the throat to have left ear.  Patient states difficulty with swallowing.  Patient was seen by telemedicine visit prior to arrival.  Based on her presentation she was advised to go to the ED for definitive evaluation to rule out tonsillar abscess.  Patient rates her pain/discomfort as 9/10.  No palliative measures for complaint.      Past Medical History:  Diagnosis Date  . BRCA negative 07/2018   MyRisk neg  . Family history of breast cancer    IBIS=13%/riskscore=8.4%  . Family history of ovarian cancer   . Family history of pancreatic cancer   . History of frequent urinary tract infections   . Hypoglycemia   . IBS (irritable bowel syndrome)     Patient Active Problem List   Diagnosis Date Noted  . BRCA negative 08/10/2018  . IBS (irritable colon syndrome) 02/20/2017  . Intermittent palpitations 11/15/2015  . Insomnia 12/30/2014  . S/P lumbar spinal fusion 05/06/2013  . Headache, migraine 03/28/2007    Past Surgical History:  Procedure Laterality Date  . ABDOMINAL HYSTERECTOMY  07/2006   PARTIAL  . BACK SURGERY    . CHOLECYSTECTOMY    . DIAGNOSTIC LAPAROSCOPY     test on bladder  . GALLBLADDER SURGERY    . LAPAROSCOPIC APPENDECTOMY N/A 12/08/2016   Procedure: APPENDECTOMY LAPAROSCOPIC;  Surgeon: Jules Husbands, MD;  Location: [facility] ORS;  Service: General;  Laterality: N/A;  . MAXIMUM ACCESS (MAS)POSTERIOR LUMBAR INTERBODY FUSION (PLIF) 1 LEVEL N/A 05/06/2013   Procedure: LUMBAR FIVE-SACRAL ONE MAXIMUM ACCESS POSTERIOR LUMBAR INTERBODY  FUSION;  Surgeon: Eustace Moore, MD;  Location: [facility];  Service: Neurosurgery;  Laterality: N/A;  . TOE SURGERY    . TUBAL LIGATION    . WISDOM TOOTH EXTRACTION      Prior to Admission medications   Medication Sig Start Date End Date Taking? Authorizing Provider  diphenhydrAMINE-Phenylephrine (BENADRYL ALLERGY CHILDRENS) 12.5-5 MG/5ML SOLN Take 25 mg by mouth in the morning, at noon, in the evening, and at bedtime. Mixed with 5 mL of viscous lidocaine.  Swish and swallow 09/06/19   Sable Feil, PA-C  FLUoxetine (PROZAC) 40 MG capsule Take 1 capsule by mouth once daily 08/01/19   Trinna Post, PA-C  lidocaine (XYLOCAINE) 2 % solution Use as directed 5 mLs in the mouth or throat every 6 (six) hours as needed for mouth pain. Benadryl elixir for swish and swallow 09/06/19   Sable Feil, PA-C  methylPREDNISolone (MEDROL DOSEPAK) 4 MG TBPK tablet Take Tapered dose as directed 09/06/19   Sable Feil, PA-C  traZODone (DESYREL) 50 MG tablet TAKE 1 TO 2 TABLETS BY MOUTH IN THE EVENING AS NEEDED FOR INSOMNIA 08/01/19   Trinna Post, PA-C    Allergies Codeine, Prednisolone, Sulfa antibiotics, and Vicodin [hydrocodone-acetaminophen]  Family History  Problem Relation Age of Onset  . Hypertension Mother   . COPD Mother   . Epilepsy Mother   . Hypertension Father   . Breast cancer Maternal Aunt 42  . Ovarian cancer Maternal Aunt 14  . Colon  cancer Paternal Uncle 87  . Pancreatic cancer Maternal Grandmother 64  . Colon cancer Maternal Grandfather 65    Social History Social History   Tobacco Use  . Smoking status: Former Smoker    Packs/day: 0.50    Years: 20.00    Pack years: 10.00    Types: Cigarettes    Quit date: 05/30/2018    Years since quitting: 1.2  . Smokeless tobacco: Never Used  Substance Use Topics  . Alcohol use: No    Alcohol/week: 0.0 standard drinks  . Drug use: No    Review of Systems Constitutional: No fever/chills Eyes: No visual  changes. ENT: Sore throat.   Cardiovascular: Denies chest pain. Respiratory: Denies shortness of breath. Gastrointestinal: No abdominal pain.  No nausea, no vomiting.  No diarrhea.  No constipation. Genitourinary: Negative for dysuria. Musculoskeletal: Negative for back pain. Skin: Negative for rash. Neurological: Negative for headaches, focal weakness or numbness. Allergic/Immunilogical: Codeine, prednisone, sulfur, and Vicodin. ____________________________________________   PHYSICAL EXAM:  VITAL SIGNS: ED Triage Vitals [09/06/19 1715]  Enc Vitals Group     BP 123/73     Pulse Rate 82     Resp 20     Temp 99.1 F (37.3 C)     Temp Source Oral     SpO2 98 %     Weight 180 lb (81.6 kg)     Height '5\' 6"'  (1.676 m)     Head Circumference      Peak Flow      Pain Score 9     Pain Loc      Pain Edu?      Excl. in [place]?    Constitutional: Alert and oriented. Well appearing and in no acute distress. Mouth/Throat: Mucous membranes are moist.  Oropharynx with bilateral edematous/exudative tonsils. Neck: No stridor.  Hematological/Lymphatic/Immunilogical: Bilateral cervical lymphadenopathy. Cardiovascular: Normal rate, regular rhythm. Grossly normal heart sounds.  Good peripheral circulation. Respiratory: Normal respiratory effort.  No retractions. Lungs CTAB. Skin:  Skin is warm, dry and intact. No rash noted.  ____________________________________________   LABS (all labs ordered are listed, but only abnormal results are displayed)  Labs Reviewed  GROUP A STREP BY PCR - Abnormal; Notable for the following components:      Result Value   Group A Strep by PCR DETECTED (*)    All other components within normal limits  BASIC METABOLIC PANEL - Abnormal; Notable for the following components:   Glucose, Bld 117 (*)    Calcium 8.6 (*)    All other components within normal limits  CBC WITH DIFFERENTIAL/PLATELET - Abnormal; Notable for the following components:   WBC 13.1 (*)     Neutro Abs 10.3 (*)    Monocytes Absolute 1.2 (*)    All other components within normal limits   ____________________________________________  EKG   ____________________________________________  RADIOLOGY  ED MD interpretation:    Official radiology report(s): CT Soft Tissue Neck W Contrast  Result Date: 09/06/2019 CLINICAL DATA:  Initial evaluation for acute sore throat, fever. EXAM: CT NECK WITH CONTRAST TECHNIQUE: Multidetector CT imaging of the neck was performed using the standard protocol following the bolus administration of intravenous contrast. CONTRAST:  23m OMNIPAQUE IOHEXOL 300 MG/ML  SOLN COMPARISON:  None available. FINDINGS: Pharynx and larynx: Oral cavity within normal limits without discrete mass or loculated collection. No acute inflammatory changes seen about the dentition. Palatine tonsils are hypertrophied and hyperenhancing bilaterally, left greater than right, suggesting acute tonsillitis. No discrete tonsillar or peritonsillar  abscess. Associated inflammatory stranding within the adjacent left parapharyngeal fat. Asymmetric fullness and mild mucosal edema within the left oropharynx and nasopharynx. No retropharyngeal collection. Epiglottis within normal limits. Vallecula clear. Remainder of the hypopharynx and supraglottic larynx within normal limits. True cords symmetric and normal. Subglottic airway clear. Salivary glands: Salivary glands including the parotid and submandibular glands are within normal limits. Thyroid: Normal. Lymph nodes: Mildly prominent left level II a node measures 11 mm in short axis, likely reactive. No other pathologically enlarged or abnormal adenopathy within the neck. Vascular: Normal intravascular enhancement seen throughout the neck. Limited intracranial: Unremarkable. Visualized orbits: Visualized globes and orbital soft tissues within normal limits. Mastoids and visualized paranasal sinuses: Multifocal retention cyst noted within the right  maxillary sinus. Visualized paranasal sinuses are otherwise largely clear. Visualized mastoids and middle ear cavities are well pneumatized and free of fluid. Skeleton: No acute osseous abnormality. No discrete or worrisome osseous lesions. Moderate cervical spondylosis noted at C5-6. Upper chest: Visualized upper chest demonstrates no acute finding. Partially visualized lung apices are clear. Other: None. IMPRESSION: 1. Findings consistent with acute tonsillitis involving the left greater than right palatine tonsils. No discrete tonsillar or peritonsillar abscess. 2. Mildly prominent left level IIa lymph node, likely reactive. 3. Moderate cervical spondylosis at C5-6. Electronically Signed   By: Jeannine Boga M.D.   On: 09/06/2019 18:54    ____________________________________________   PROCEDURES  Procedure(s) performed (including Critical Care):  Procedures   ____________________________________________   INITIAL IMPRESSION / ASSESSMENT AND PLAN / ED COURSE  As part of my medical decision making, I reviewed the following data within the [place]     Patient presents with acute onset of sore throat.  Discussed labs and imaging results with patient.  Physical exam consistent with strep pharyngitis.  Patient given Bicillin and Decadron IM.  Patient given prescription for Medrol Dosepak and Magic mouthwash.  Patient given a work note and advised to follow-up PCP if no improvement in 3 days.  Return to ED if condition worsens.    [name] was evaluated in Emergency Department on 09/06/2019 for the symptoms described in the history of present illness. She was evaluated in the context of the global COVID-19 pandemic, which necessitated consideration that the patient might be at risk for infection with the SARS-CoV-2 virus that causes COVID-19. Institutional protocols and algorithms that pertain to the evaluation of patients at risk for COVID-19 are in a state of rapid  change based on information released by regulatory bodies including the CDC and federal and state organizations. These policies and algorithms were followed during the patient's care in the ED.       ____________________________________________   FINAL CLINICAL IMPRESSION(S) / ED DIAGNOSES  Final diagnoses:  Strep throat     ED Discharge Orders         Ordered    methylPREDNISolone (MEDROL DOSEPAK) 4 MG TBPK tablet     09/06/19 1906    lidocaine (XYLOCAINE) 2 % solution  Every 6 hours PRN     09/06/19 1906    diphenhydrAMINE-Phenylephrine (BENADRYL ALLERGY CHILDRENS) 12.5-5 MG/5ML SOLN  4 times daily     09/06/19 1906           Note:  This document was prepared using Dragon voice recognition software and may include unintentional dictation errors.    Sable Feil, PA-C 09/06/19 1911    Delman Kitten, MD 09/06/19 2119

## 2019-09-21 ENCOUNTER — Ambulatory Visit: Payer: BC Managed Care – PPO | Admitting: Psychology

## 2019-10-05 ENCOUNTER — Ambulatory Visit: Payer: BC Managed Care – PPO | Admitting: Psychology

## 2019-10-19 ENCOUNTER — Ambulatory Visit: Payer: BC Managed Care – PPO | Admitting: Psychology

## 2019-11-09 ENCOUNTER — Other Ambulatory Visit: Payer: Self-pay | Admitting: Physician Assistant

## 2019-11-09 DIAGNOSIS — F439 Reaction to severe stress, unspecified: Secondary | ICD-10-CM

## 2019-12-31 ENCOUNTER — Other Ambulatory Visit: Payer: Self-pay

## 2020-01-01 LAB — SARS-COV-2, NAA 2 DAY TAT

## 2020-01-01 LAB — NOVEL CORONAVIRUS, NAA: SARS-CoV-2, NAA: NOT DETECTED

## 2020-01-03 ENCOUNTER — Ambulatory Visit (INDEPENDENT_AMBULATORY_CARE_PROVIDER_SITE_OTHER): Payer: Self-pay | Admitting: Podiatry

## 2020-01-03 ENCOUNTER — Encounter: Payer: Self-pay | Admitting: Podiatry

## 2020-01-03 ENCOUNTER — Other Ambulatory Visit: Payer: Self-pay

## 2020-01-03 DIAGNOSIS — L600 Ingrowing nail: Secondary | ICD-10-CM

## 2020-01-03 MED ORDER — NEOMYCIN-POLYMYXIN-HC 1 % OT SOLN: OTIC | 1 refills | Status: DC

## 2020-01-03 NOTE — Patient Instructions (Signed)

## 2020-01-03 NOTE — Progress Notes (Signed)
Subjective:  Patient ID: [name], female    DOB: 06/14/1975,  MRN: [id number] HPI Chief Complaint  Patient presents with  . Toe Pain    Hallux left - lateral border, red, swollen x few weeks, took antibiotic  . New Patient (Initial Visit)    Est pt 77    44 y.o. female presents with the above complaint.   ROS: Denies fever chills nausea vomiting muscle aches pains calf pain back pain chest pain shortness of breath.  Past Medical History:  Diagnosis Date  . BRCA negative 07/2018   MyRisk neg  . Family history of breast cancer    IBIS=13%/riskscore=8.4%  . Family history of ovarian cancer   . Family history of pancreatic cancer   . History of frequent urinary tract infections   . Hypoglycemia   . IBS (irritable bowel syndrome)    Past Surgical History:  Procedure Laterality Date  . ABDOMINAL HYSTERECTOMY  07/2006   PARTIAL  . BACK SURGERY    . CHOLECYSTECTOMY    . DIAGNOSTIC LAPAROSCOPY     test on bladder  . GALLBLADDER SURGERY    . LAPAROSCOPIC APPENDECTOMY N/A 12/08/2016   Procedure: APPENDECTOMY LAPAROSCOPIC;  Surgeon: Jules Husbands, MD;  Location: [facility] ORS;  Service: General;  Laterality: N/A;  . MAXIMUM ACCESS (MAS)POSTERIOR LUMBAR INTERBODY FUSION (PLIF) 1 LEVEL N/A 05/06/2013   Procedure: LUMBAR FIVE-SACRAL ONE MAXIMUM ACCESS POSTERIOR LUMBAR INTERBODY FUSION;  Surgeon: Eustace Moore, MD;  Location: [facility];  Service: Neurosurgery;  Laterality: N/A;  . TOE SURGERY    . TUBAL LIGATION    . WISDOM TOOTH EXTRACTION      Current Outpatient Medications:  .  FLUoxetine (PROZAC) 40 MG capsule, Take 1 capsule by mouth once daily, Disp: 90 capsule, Rfl: 0 .  NEOMYCIN-POLYMYXIN-HYDROCORTISONE (CORTISPORIN) 1 % SOLN OTIC solution, Apply 1-2 drops to toe BID after soaking, Disp: 10 mL, Rfl: 1 .  traZODone (DESYREL) 50 MG tablet, TAKE 1 TO 2 TABLETS BY MOUTH IN THE EVENING AS NEEDED FOR INSOMNIA, Disp: 180 tablet, Rfl: 0  Allergies  Allergen Reactions  .  Codeine Nausea And Vomiting  . Sulfa Antibiotics Rash  . Vicodin [Hydrocodone-Acetaminophen] Nausea Only    And itching all over   Review of Systems Objective:  There were no vitals filed for this visit.  General: Well developed, nourished, in no acute distress, alert and oriented x3   Dermatological: Skin is warm, dry and supple bilateral. Nails x 10 are well maintained; remaining integument appears unremarkable at this time. There are no open sores, no preulcerative lesions, no rash or signs of infection present.  Vascular: Dorsalis Pedis artery and Posterior Tibial artery pedal pulses are 2/4 bilateral with immedate capillary fill time. Pedal hair growth present. No varicosities and no lower extremity edema present bilateral.   Neruologic: Grossly intact via light touch bilateral. Vibratory intact via tuning fork bilateral. Protective threshold with Semmes Wienstein monofilament intact to all pedal sites bilateral. Patellar and Achilles deep tendon reflexes 2+ bilateral. No Babinski or clonus noted bilateral.   Musculoskeletal: No gross boney pedal deformities bilateral. No pain, crepitus, or limitation noted with foot and ankle range of motion bilateral. Muscular strength 5/5 in all groups tested bilateral.  Gait: Unassisted, Nonantalgic.    Radiographs:  None taken  Assessment & Plan:   Assessment: Ingrown nail fibular border hallux left,   Plan: Performed chemical matricectomy to the tibiofibular border of the hallux left today she tolerated procedure well without complications.  Start her on Cortisporin Otic placed 2 drops after soaking twice daily.  She tolerated procedure well without complications we will follow-up with her in 2 to 3 weeks.     Elania Crowl T. [place], [place]

## 2020-01-26 ENCOUNTER — Encounter: Payer: Self-pay | Admitting: Podiatry

## 2020-01-26 ENCOUNTER — Ambulatory Visit: Payer: BLUE CROSS/BLUE SHIELD | Admitting: Podiatry

## 2020-01-26 ENCOUNTER — Other Ambulatory Visit: Payer: Self-pay

## 2020-01-26 DIAGNOSIS — L603 Nail dystrophy: Secondary | ICD-10-CM

## 2020-01-26 DIAGNOSIS — L601 Onycholysis: Secondary | ICD-10-CM | POA: Diagnosis not present

## 2020-01-26 DIAGNOSIS — L03032 Cellulitis of left toe: Secondary | ICD-10-CM | POA: Diagnosis not present

## 2020-01-26 DIAGNOSIS — L608 Other nail disorders: Secondary | ICD-10-CM | POA: Diagnosis not present

## 2020-01-26 NOTE — Progress Notes (Signed)
She presents today for follow-up of her matrixectomy fibular border hallux left she states that it does not hurt but it does not look too good.  She also is here for samples of her toenails.  Objective: Vital signs are stable she is alert oriented x3.  There is no erythema edema cellulitis drainage or odor fibular margin of the hallux left demonstrates well-healing matrixectomy.  Toenails are long thick yellow dystrophic clinically mycotic.  Assessment: Pain in limb secondary to nail dystrophy.  Resolving surgical toe hallux left.  Plan: Continue to soak daily or every other day Epson salts and warm water continue to cover during the day but leave open at bedtime.  I also requested does take samples of the nails today we took samples of the nails and will follow up with her in 1 month.  She will call sooner if needed foot matrixectomy.

## 2020-02-16 ENCOUNTER — Encounter: Payer: Self-pay | Admitting: *Deleted

## 2020-02-18 ENCOUNTER — Encounter: Payer: Self-pay | Admitting: Family Medicine

## 2020-02-18 ENCOUNTER — Ambulatory Visit: Payer: Self-pay | Admitting: Family Medicine

## 2020-02-18 ENCOUNTER — Other Ambulatory Visit: Payer: Self-pay

## 2020-02-18 DIAGNOSIS — A6004 Herpesviral vulvovaginitis: Secondary | ICD-10-CM

## 2020-02-18 DIAGNOSIS — Z113 Encounter for screening for infections with a predominantly sexual mode of transmission: Secondary | ICD-10-CM

## 2020-02-18 LAB — WET PREP FOR TRICH, YEAST, CLUE
Trichomonas Exam: NEGATIVE
Yeast Exam: NEGATIVE

## 2020-02-18 MED ORDER — ACYCLOVIR 400 MG PO TABS: 400.0000 mg | ORAL_TABLET | Freq: Three times a day (TID) | ORAL | 0 refills | Status: AC

## 2020-02-18 NOTE — Progress Notes (Signed)
Patient here for STD testing, c/o vaginal itching, irritation, discharge with malodor. States she used a boric acid suppository yesterday.Burt Knack, RN

## 2020-02-18 NOTE — Progress Notes (Signed)
[facility] Department STI clinic/screening visit  Subjective:  [name] is a 44 y.o. female being seen today for  Chief Complaint  Patient presents with  . SEXUALLY TRANSMITTED DISEASE     The patient reports they do have symptoms. Patient reports that they do not desire a pregnancy in the next year. They reported they are not interested in discussing contraception today.   Patient has the following medical conditions:   Patient Active Problem List   Diagnosis Date Noted  . BRCA negative 08/10/2018  . IBS (irritable colon syndrome) 02/20/2017  . Intermittent palpitations 11/15/2015  . Insomnia 12/30/2014  . S/P lumbar spinal fusion 05/06/2013  . Headache, migraine 03/28/2007    HPI  Pt reports here for STI screening. She has had white discharge, malodor x3 days. She used boric acid suppository yesterday to treat what may have been recurrent BV. Today noticed area of burning/itching/tingling spot on R labia. Denies fever or flu like symptoms. No known partners w/symptoms.  See flowsheet for further details and programmatic requirements.    No LMP recorded. Patient has had a hysterectomy. Last sex: 1 wk ago BCM: s/p tubal ligation Desires EC? n/a  Last pap per pt/review of record: 2004 Last HIV test per pt/review of record: 2018  Last tetanus vaccine: unsure   No components found for: HCV  The following portions of the patient's history were reviewed and updated as appropriate: allergies, current medications, past medical history, past social history, past surgical history and problem list.  Objective:  There were no vitals filed for this visit.   Physical Exam Vitals and nursing note reviewed.  Constitutional:      Appearance: Normal appearance.  HENT:     Head: Normocephalic and atraumatic.     Mouth/Throat:     Mouth: Mucous membranes are moist.     Pharynx: Oropharynx is clear. No oropharyngeal exudate or posterior oropharyngeal erythema.    Pulmonary:     Effort: Pulmonary effort is normal.  Abdominal:     General: Abdomen is flat.     Palpations: There is no mass.     Tenderness: There is no abdominal tenderness. There is no rebound.  Genitourinary:    General: Normal vulva.     Exam position: Lithotomy position.     Pubic Area: No rash or pubic lice.      Labia:        Right: Lesion present. No rash.        Left: No rash or lesion.      Vagina: Vaginal discharge (white, moderate amt, ph<4.5) present. No erythema, bleeding or lesions.     Adnexa: Right adnexa normal and left adnexa normal.     Rectum: Normal.       Comments: Cervix and uterus surgically absent. Lymphadenopathy:     Head:     Right side of head: No preauricular or posterior auricular adenopathy.     Left side of head: No preauricular or posterior auricular adenopathy.     Cervical: No cervical adenopathy.     Upper Body:     Right upper body: No supraclavicular or axillary adenopathy.     Left upper body: No supraclavicular or axillary adenopathy.     Lower Body: No right inguinal adenopathy. No left inguinal adenopathy.  Skin:    General: Skin is warm and dry.     Findings: No rash.  Neurological:     Mental Status: She is alert and oriented to person, place,  and time.      Assessment and Plan:  [name] is a 44 y.o. female presenting to the [facility] for STI screening   1. Screening examination for venereal disease -Pt with symptoms. Screenings today as below. Treat wet prep per standing order. -Patient does meet criteria for HepB, HepC Screening. Accepts these screenings. -Counseled on warning s/sx and when to seek care. Recommended condom use with all sex and discussed importance of condom use for STI prevention. - WET PREP FOR [place], YEAST, CLUE - Chlamydia/Gonorrhea [facility] - HBV Antigen/Antibody [facility] - HIV/HCV [facility] - Syphilis Serology, [facility]  2. Herpes simplex  vulvovaginitis -Appearance of HSV on exam, will get culture and treat what is likely primary infection today as below.  -Pt was counseled regarding HSV disease, transmission, cyclic nature, subclinical disease and treatment options.  -Pt informed they will be called if culture is HSV + and option for suppressive treatment will also be discussed further at that time. - Virology, [facility] - acyclovir (ZOVIRAX) 400 MG tablet; Take 1 tablet (400 mg total) by mouth 3 (three) times daily for 10 days.  Dispense: 30 tablet; Refill: 0  The patient was dispensed acyclovir today. I provided counseling today regarding the medication. We discussed the medication, the side effects and when to call clinic. Patient given the opportunity to ask questions. Questions answered.   Return if symptoms worsen or fail to improve.  Future Appointments  Date Time Provider [place]  02/28/2020  3:45 PM [place], Bennie Pierini T, [place] [id number]    Kandee Keen, PA-C

## 2020-02-18 NOTE — Progress Notes (Signed)
Wet mount reviewed, no treatment indicated. Patient [facility] updated with correct name, address, phone per [facility] by client. Client counseled Tdap due 2025, flu vaccine coming soon, and given covid 19 vaccine appointment information.Burt Knack, RN

## 2020-02-21 ENCOUNTER — Other Ambulatory Visit: Payer: Self-pay | Admitting: Physician Assistant

## 2020-02-25 ENCOUNTER — Encounter: Payer: Self-pay | Admitting: Family Medicine

## 2020-02-25 LAB — HEPATITIS B SURFACE ANTIGEN: Hepatitis B Surface Ag: NONREACTIVE

## 2020-02-28 ENCOUNTER — Other Ambulatory Visit: Payer: Self-pay

## 2020-02-28 ENCOUNTER — Encounter: Payer: Self-pay | Admitting: Podiatry

## 2020-02-28 ENCOUNTER — Ambulatory Visit (INDEPENDENT_AMBULATORY_CARE_PROVIDER_SITE_OTHER): Payer: BLUE CROSS/BLUE SHIELD | Admitting: Podiatry

## 2020-02-28 DIAGNOSIS — Z79899 Other long term (current) drug therapy: Secondary | ICD-10-CM | POA: Diagnosis not present

## 2020-02-28 DIAGNOSIS — L603 Nail dystrophy: Secondary | ICD-10-CM

## 2020-02-28 MED ORDER — TERBINAFINE HCL 250 MG PO TABS: 250.0000 mg | ORAL_TABLET | Freq: Every day | ORAL | 0 refills | Status: DC

## 2020-02-28 NOTE — Progress Notes (Signed)
She presents today for follow-up of her pathology results regarding her toenails.  She states that the matrixectomy is healing just fine no problems whatsoever.  Objective: Vital signs are stable she is alert and oriented x3.  Did not examine the feet today.  We did discuss in great detail the pros and cons of the use of different medications and treatment types for onychomycosis.  Pathology results do demonstrate dermatophytic onychomycosis.  Assessment: Onychomycosis.  Plan: We discussed topical therapy laser therapy and oral therapy at this point she has chosen oral therapy Lamisil tablets 1 tablet daily after a requisition of blood work will be sent.  Should blood work come back abnormal we will notify her immediately.  Otherwise I will follow-up with her in 1 month for another set of blood work.

## 2020-03-01 ENCOUNTER — Other Ambulatory Visit: Payer: Self-pay | Admitting: Physician Assistant

## 2020-03-01 DIAGNOSIS — F439 Reaction to severe stress, unspecified: Secondary | ICD-10-CM

## 2020-03-01 NOTE — Telephone Encounter (Signed)
Requested Prescriptions  Pending Prescriptions Disp Refills   FLUoxetine (PROZAC) 40 MG capsule [Pharmacy Med Name: FLUoxetine HCl 40 MG Oral Capsule] 90 capsule 0    Sig: Take 1 capsule by mouth once daily     Psychiatry:  Antidepressants - SSRI Passed - 03/01/2020 10:09 AM      Passed - Valid encounter within last 6 months    Recent Outpatient Visits          5 months ago Sore throat   [facility] Osvaldo Angst M, [place]   9 months ago Acute non-recurrent frontal sinusitis   [facility] [place], Ricki Rodriguez M, [place]   10 months ago Acute non-recurrent frontal sinusitis   [facility] Osvaldo Angst M, [place]   11 months ago Cough   [facility] [place], Ricki Rodriguez M, [place]   1 year ago Diarrhea, unspecified type   [facility] [place], [place], [place]

## 2020-03-02 DIAGNOSIS — Z79899 Other long term (current) drug therapy: Secondary | ICD-10-CM | POA: Diagnosis not present

## 2020-03-03 LAB — COMPREHENSIVE METABOLIC PANEL
ALT: 15 IU/L (ref 0–32)
AST: 16 IU/L (ref 0–40)
Albumin/Globulin Ratio: 2 (ref 1.2–2.2)
Albumin: 4.9 g/dL — ABNORMAL HIGH (ref 3.8–4.8)
Alkaline Phosphatase: 76 IU/L (ref 44–121)
BUN/Creatinine Ratio: 18 (ref 9–23)
BUN: 15 mg/dL (ref 6–24)
Bilirubin Total: 0.3 mg/dL (ref 0.0–1.2)
CO2: 21 mmol/L (ref 20–29)
Calcium: 9.7 mg/dL (ref 8.7–10.2)
Chloride: 102 mmol/L (ref 96–106)
Creatinine, Ser: 0.83 mg/dL (ref 0.57–1.00)
GFR calc Af Amer: 101 mL/min/{1.73_m2} (ref >59)
GFR calc non Af Amer: 87 mL/min/{1.73_m2} (ref >59)
Globulin, Total: 2.4 g/dL (ref 1.5–4.5)
Glucose: 88 mg/dL (ref 65–99)
Potassium: 5.2 mmol/L (ref 3.5–5.2)
Sodium: 139 mmol/L (ref 134–144)
Total Protein: 7.3 g/dL (ref 6.0–8.5)

## 2020-03-15 ENCOUNTER — Telehealth: Payer: Self-pay

## 2020-03-15 NOTE — Telephone Encounter (Signed)
-----   Message from Elinor Parkinson, [place] sent at 03/07/2020  7:52 AM EDT ----- Blood work looks good and may continue medication.

## 2020-03-15 NOTE — Telephone Encounter (Signed)
Patient notified of results and instructed to continue medication as directed by Dr. Al Corpus

## 2020-03-29 ENCOUNTER — Encounter: Payer: Self-pay | Admitting: Podiatry

## 2020-03-29 ENCOUNTER — Ambulatory Visit: Payer: BLUE CROSS/BLUE SHIELD | Admitting: Podiatry

## 2020-03-29 ENCOUNTER — Other Ambulatory Visit: Payer: Self-pay

## 2020-03-29 DIAGNOSIS — L603 Nail dystrophy: Secondary | ICD-10-CM

## 2020-03-29 DIAGNOSIS — Z79899 Other long term (current) drug therapy: Secondary | ICD-10-CM | POA: Diagnosis not present

## 2020-03-29 MED ORDER — TERBINAFINE HCL 250 MG PO TABS: 250.0000 mg | ORAL_TABLET | Freq: Every day | ORAL | 0 refills | Status: DC

## 2020-03-29 NOTE — Progress Notes (Signed)
She presents today states that the one that he did the ingrown toenail on is starting to come back I have not noticed much change in the nail plates of yet denies fever chills nausea vomiting muscle aches pains calf pain back pain chest pain shortness of breath itching or rashes.  She is completed her first 30 days of Lamisil.  Objective: Vital signs are stable alert oriented x3 pulses are palpable.  No change in the nail plates as of yet.  The old margin of the nail with is still growing out but I trimmed the corner all for her today because it was digging in just a little bit this should help alleviate her symptoms.  Assessment long-term therapy of Lamisil secondary to onychomycosis..  Plan: Discussed etiology pathology and surgical therapies requested today complete metabolic panel and I will follow-up with her in about 4 months we already sent over a prescription for 90 more of Lamisil tablets.

## 2020-03-30 LAB — COMPREHENSIVE METABOLIC PANEL
ALT: 16 IU/L (ref 0–32)
AST: 16 IU/L (ref 0–40)
Albumin/Globulin Ratio: 1.7 (ref 1.2–2.2)
Albumin: 4.5 g/dL (ref 3.8–4.8)
Alkaline Phosphatase: 90 IU/L (ref 44–121)
BUN/Creatinine Ratio: 14 (ref 9–23)
BUN: 13 mg/dL (ref 6–24)
Bilirubin Total: <0.2 mg/dL (ref 0.0–1.2)
CO2: 22 mmol/L (ref 20–29)
Calcium: 9.4 mg/dL (ref 8.7–10.2)
Chloride: 101 mmol/L (ref 96–106)
Creatinine, Ser: 0.92 mg/dL (ref 0.57–1.00)
GFR calc Af Amer: 89 mL/min/{1.73_m2} (ref >59)
GFR calc non Af Amer: 77 mL/min/{1.73_m2} (ref >59)
Globulin, Total: 2.6 g/dL (ref 1.5–4.5)
Glucose: 82 mg/dL (ref 65–99)
Potassium: 4.6 mmol/L (ref 3.5–5.2)
Sodium: 137 mmol/L (ref 134–144)
Total Protein: 7.1 g/dL (ref 6.0–8.5)

## 2020-04-03 ENCOUNTER — Telehealth: Payer: Self-pay

## 2020-04-03 NOTE — Telephone Encounter (Signed)
Patient notified of results and instructed to continue medication per Dr. Geryl Rankins order

## 2020-04-03 NOTE — Telephone Encounter (Signed)
-----   Message from Elinor Parkinson, [place] sent at 03/30/2020  5:27 PM EDT ----- Blood work good may continue medication.

## 2020-05-31 ENCOUNTER — Ambulatory Visit: Payer: Self-pay

## 2020-05-31 NOTE — Telephone Encounter (Signed)
Pt. Has known COVID 19 exposure from co-worker. Pt. Has headache, chills, fatigue, burning in throat. Virtual visit made for tomorrow. Interested in being tested today if possible. Please advise.  Answer Assessment - Initial Assessment Questions 1. COVID-19 EXPOSURE: "Please describe how you were exposed to someone with a COVID-19 infection."     Work 2. PLACE of CONTACT: "Where were you when you were exposed to COVID-19?" (e.g., home, school, medical waiting room; which city?)     Work 3. TYPE of CONTACT: "How much contact was there?" (e.g., sitting next to, live in same house, work in same office, same building)     Work 4. DURATION of CONTACT: "How long were you in contact with the COVID-19 patient?" (e.g., a few seconds, passed by person, a few minutes, 15 minutes or longer, live with the patient)     All day 5. MASK: "Were you wearing a mask?" "Was the other person wearing a mask?" Note: wearing a mask reduces the risk of an otherwise close contact.     No 6. DATE of CONTACT: "When did you have contact with a COVID-19 patient?" (e.g., how many days ago)     Yesterday 7. COMMUNITY SPREAD: "Are there lots of cases of COVID-19 (community spread) where you live?" (See public health department website, if unsure)       Yes 8. SYMPTOMS: "Do you have any symptoms?" (e.g., fever, cough, breathing difficulty, loss of taste or smell)     Fatigue, chills, headache, burning throat 9. VACCINE: "Have you gotten the COVID-19 vaccine?" If Yes ask: "Which one, how many shots, when did you get it?"     No 10. PREGNANCY OR POSTPARTUM: "Is there any chance you are pregnant?" "When was your last menstrual period?" "Did you deliver in the last 2 weeks?"       No 11. HIGH RISK: "Do you have any heart or lung problems?" "Do you have a weak immune system?" (e.g., heart failure, COPD, asthma, HIV positive, chemotherapy, renal failure, diabetes mellitus, sickle cell anemia, obesity)       No 12. TRAVEL: "Have you  traveled out of the country recently?" If Yes, ask: "When and where?" Also ask about out-of-state travel, since the CDC has identified some high-risk cities for community spread in the [place]. Note: Travel becomes less relevant if there is widespread community transmission where the patient lives.       No  Protocols used: CORONAVIRUS (COVID-19) EXPOSURE-A-AH

## 2020-06-01 ENCOUNTER — Ambulatory Visit (INDEPENDENT_AMBULATORY_CARE_PROVIDER_SITE_OTHER): Payer: BLUE CROSS/BLUE SHIELD | Admitting: Family Medicine

## 2020-06-01 DIAGNOSIS — Z20822 Contact with and (suspected) exposure to covid-19: Secondary | ICD-10-CM

## 2020-06-01 DIAGNOSIS — R5383 Other fatigue: Secondary | ICD-10-CM | POA: Diagnosis not present

## 2020-06-01 DIAGNOSIS — R5381 Other malaise: Secondary | ICD-10-CM

## 2020-06-01 NOTE — Progress Notes (Signed)
Virtual telephone visit    Virtual Visit via Telephone Note   This visit type was conducted due to national recommendations for restrictions regarding the COVID-19 Pandemic (e.g. social distancing) in an effort to limit this patient's exposure and mitigate transmission in our community. Due to her co-morbid illnesses, this patient is at least at moderate risk for complications without adequate follow up. This format is felt to be most appropriate for this patient at this time. The patient did not have access to video technology or had technical difficulties with video requiring transitioning to audio format only (telephone). Physical exam was limited to content and character of the telephone converstion.    Patient location: Home Provider location: Office  I discussed the limitations of evaluation and management by telemedicine and the availability of in person appointments. The patient expressed understanding and agreed to proceed.   Visit Date: 06/01/2020  Today's healthcare provider: Vernie Murders, PA-C   No chief complaint on file.  Subjective    HPI  Upper Respiratory Infection: Patient complains of symptoms of a URI. Symptoms include left ear pain, congestion, cough, diarrhea and fever. Onset of symptoms was 3 days ago, gradually worsening since that time. Pt said her coworker tested positive for covid 2 days ago. She also c/o congestion and non productive cough for the past 3 days .  She is drinking plenty of fluids. Evaluation to date: none. Treatment to date:  tylenol  and has had no relief from headache.     Past Medical History:  Diagnosis Date   BRCA negative 07/2018   MyRisk neg   Family history of breast cancer    IBIS=13%/riskscore=8.4%   Family history of ovarian cancer    Family history of pancreatic cancer    History of frequent urinary tract infections    Hypoglycemia    IBS (irritable bowel syndrome)    Past Surgical History:  Procedure Laterality  Date   ABDOMINAL HYSTERECTOMY  07/2006   PARTIAL   BACK SURGERY     CHOLECYSTECTOMY     DIAGNOSTIC LAPAROSCOPY     test on bladder   GALLBLADDER SURGERY     LAPAROSCOPIC APPENDECTOMY N/A 12/08/2016   Procedure: APPENDECTOMY LAPAROSCOPIC;  Surgeon: Jules Husbands, MD;  Location: [facility];  Service: General;  Laterality: N/A;   MAXIMUM ACCESS (MAS)POSTERIOR LUMBAR INTERBODY FUSION (PLIF) 1 LEVEL N/A 05/06/2013   Procedure: LUMBAR FIVE-SACRAL ONE MAXIMUM ACCESS POSTERIOR LUMBAR INTERBODY FUSION;  Surgeon: Eustace Moore, MD;  Location: [facility];  Service: Neurosurgery;  Laterality: N/A;   TOE SURGERY     TUBAL LIGATION     WISDOM TOOTH EXTRACTION     Social History   Tobacco Use   Smoking status: Current Every Day Smoker    Packs/day: 0.50    Years: 20.00    Pack years: 10.00    Types: Cigarettes    Last attempt to quit: 05/30/2018    Years since quitting: 2.0   Smokeless tobacco: Never Used  Vaping Use   Vaping Use: Never used  Substance Use Topics   Alcohol use: No    Alcohol/week: 0.0 standard drinks   Drug use: No   Family History  Problem Relation Age of Onset   Hypertension Mother    COPD Mother    Epilepsy Mother    Hypertension Father    Breast cancer Maternal Aunt 61   Ovarian cancer Maternal Aunt 45   Colon cancer Paternal Uncle 89   Pancreatic cancer  Maternal Grandmother 57   Colon cancer Maternal Grandfather 63   Allergies  Allergen Reactions   Codeine Nausea And Vomiting   Sulfa Antibiotics Rash   Vicodin [Hydrocodone-Acetaminophen] Nausea Only    And itching all over      Medications: Outpatient Medications Prior to Visit  Medication Sig   FLUoxetine (PROZAC) 40 MG capsule Take 1 capsule by mouth once daily   terbinafine (LAMISIL) 250 MG tablet Take 1 tablet (250 mg total) by mouth daily.   traZODone (DESYREL) 50 MG tablet TAKE 1 TO 2 TABLETS BY MOUTH IN THE EVENING AS NEEDED FOR INSOMNIA   No facility-administered medications prior to visit.     Review of Systems  Constitutional: Positive for appetite change, chills, fatigue and fever.  HENT: Positive for congestion, ear pain and sinus pain.   Respiratory: Positive for cough. Negative for apnea, chest tightness, shortness of breath and wheezing.        Objective    There were no vitals taken for this visit.   During telephonic interview, patient does not seem to have any acute respiratory distress.   Assessment & Plan     1. Malaise and fatigue  - Coronavirus (COVID-19) with Influenza A and Influenza B  2. Exposure to COVID-19 virus  - Coronavirus (COVID-19) with Influenza A and Influenza B   No follow-ups on file.    I discussed the assessment and treatment plan with the patient. The patient was provided an opportunity to ask questions and all were answered. The patient agreed with the plan and demonstrated an understanding of the instructions.   The patient was advised to call back or seek an in-person evaluation if the symptoms worsen or if the condition fails to improve as anticipated.  I provided 14 minutes of non-face-to-face time during this encounter.  Am the supervising physician for Fannie Knee, PA and I am signing off on this unfinished note with no further clinical information Wilhemena Durie., MD   Vernie Murders, PA-C [facility] [phone] (phone) [phone] (fax)  [place]

## 2020-06-03 LAB — COVID-19, FLU A+B NAA
Influenza A, NAA: NOT DETECTED
Influenza B, NAA: NOT DETECTED
SARS-CoV-2, NAA: NOT DETECTED

## 2020-06-14 ENCOUNTER — Other Ambulatory Visit: Payer: Self-pay | Admitting: Physician Assistant

## 2020-06-14 DIAGNOSIS — F439 Reaction to severe stress, unspecified: Secondary | ICD-10-CM

## 2020-07-19 NOTE — Progress Notes (Signed)
Established patient visit   Patient: [name]   DOB: November 28, 1975   45 y.o. Female  MRN: [id number] Visit Date: 07/20/2020  Today's healthcare provider: Trey Sailors, PA-C   Chief Complaint  Patient presents with  . Anxiety  I,[name],acting as a scribe for Trey Sailors, PA-C.,have documented all relevant documentation on the behalf of Trey Sailors, PA-C,as directed by  Trey Sailors, PA-C while in the presence of Trey Sailors, PA-C.  Subjective    HPI  Anxiety, Follow-up  She was last seen for anxiety 11 months ago. Changes made at last visit include  increased prozac to 40 mg daily. She reports she has a lot that has been going on but she doesn't want to talk about it today.    She reports excellent compliance with treatment. She reports good tolerance of treatment. She is not having side effects.   She feels her anxiety is moderate and Unchanged since last visit.  Symptoms: No chest pain No difficulty concentrating  No dizziness Yes fatigue  Yes feelings of losing control Yes insomnia  Yes irritable No palpitations  Yes panic attacks No racing thoughts  No shortness of breath No sweating  No tremors/shakes    GAD-7 Results GAD-7 Generalized Anxiety Disorder Screening Tool 07/20/2020  1. Feeling Nervous, Anxious, or on Edge 2  2. Not Being Able to Stop or Control Worrying 3  3. Worrying Too Much About Different Things 3  4. Trouble Relaxing 1  5. Being So Restless it's Hard To Sit Still 0  6. Becoming Easily Annoyed or Irritable 2  7. Feeling Afraid As If Something Awful Might Happen 1  Total GAD-7 Score 12  Difficulty At Work, Home, or Getting  Along With Others? Somewhat difficult    PHQ-9 Scores PHQ9 SCORE ONLY 07/20/2020 03/11/2016 11/15/2015  PHQ-9 Total Score 8 18 0   Insomnia: Has been on trazodone 100 mg for several months. She reports she got this from the pharmacy recently and the pill looks different. She reports  it is not working now.   She reports whole body itching that has been occurring for several months. No rashes. Wonders if it may be due to anxiety. Reports she is drinking a lot more.   ---------------------------------------------------------------------------------------------------        Medications: Outpatient Medications Prior to Visit  Medication Sig  . FLUoxetine (PROZAC) 40 MG capsule Take 1 capsule by mouth once daily  . traZODone (DESYREL) 50 MG tablet TAKE 1 TO 2 TABLETS BY MOUTH IN THE EVENING AS NEEDED FOR INSOMNIA  . terbinafine (LAMISIL) 250 MG tablet Take 1 tablet (250 mg total) by mouth daily. (Patient not taking: Reported on 07/20/2020)   No facility-administered medications prior to visit.    Review of Systems  Constitutional: Negative.   Respiratory: Negative.   Psychiatric/Behavioral: Positive for agitation and sleep disturbance. Negative for confusion, decreased concentration, self-injury and suicidal ideas. The patient is nervous/anxious. The patient is not hyperactive.        Objective    BP 106/81 (BP Location: Left Arm, Patient Position: Sitting, Cuff Size: Large)   Pulse 66   Temp 98.7 F (37.1 C) (Oral)   Wt 187 lb 8 oz (85 kg)   SpO2 99%   BMI 30.26 kg/m     Physical Exam Constitutional:      Appearance: Normal appearance. She is obese.  Cardiovascular:     Rate and Rhythm: Normal rate.  Pulmonary:  Effort: Pulmonary effort is normal.  Skin:    General: Skin is warm and dry.  Neurological:     General: No focal deficit present.     Mental Status: She is alert and oriented to person, place, and time. Mental status is at baseline.  Psychiatric:        Mood and Affect: Mood normal.        Behavior: Behavior normal.       No results found for any visits on 07/20/20.  Assessment & Plan    1. Depression, unspecified depression type  Continue prozac, have refilled this.   2. Insomnia, unspecified type  Have increased  trazodone.   - traZODone (DESYREL) 100 MG tablet; Take 1 tablet (100 mg total) by mouth at bedtime.  Dispense: 90 tablet; Refill: 1  3. Itching  Without rash, doubt allergic reaction or contact dermatitis. Discussed systemic causes of itching including liver pathology. Patient had normal liver enzymes 03/2020 and so declines lab today. Also discussed anxiety as possible source.   4. Situational stress  - FLUoxetine (PROZAC) 40 MG capsule; Take 1 capsule (40 mg total) by mouth daily.  Dispense: 90 capsule; Refill: 3  5. Recurrent major depressive disorder, remission status unspecified (HCC)    No follow-ups on file.      ITrey Sailors, PA-C, have reviewed all documentation for this visit. The documentation on 07/20/20 for the exam, diagnosis, procedures, and orders are all accurate and complete.  The entirety of the information documented in the History of Present Illness, Review of Systems and Physical Exam were personally obtained by me. Portions of this information were initially documented by [facility] and reviewed by me for thoroughness and accuracy.     Maryella Shivers  [facility] [phone] (phone) [phone] (fax)  [facility]

## 2020-07-20 ENCOUNTER — Other Ambulatory Visit: Payer: Self-pay

## 2020-07-20 ENCOUNTER — Encounter: Payer: Self-pay | Admitting: Physician Assistant

## 2020-07-20 ENCOUNTER — Ambulatory Visit: Payer: BLUE CROSS/BLUE SHIELD | Admitting: Physician Assistant

## 2020-07-20 ENCOUNTER — Other Ambulatory Visit: Payer: Self-pay | Admitting: Physician Assistant

## 2020-07-20 VITALS — BP 106/81 | HR 66 | Temp 98.7°F | Wt 187.5 lb

## 2020-07-20 DIAGNOSIS — L299 Pruritus, unspecified: Secondary | ICD-10-CM | POA: Diagnosis not present

## 2020-07-20 DIAGNOSIS — F32A Depression, unspecified: Secondary | ICD-10-CM | POA: Diagnosis not present

## 2020-07-20 DIAGNOSIS — G4700 Insomnia, unspecified: Secondary | ICD-10-CM | POA: Diagnosis not present

## 2020-07-20 DIAGNOSIS — F439 Reaction to severe stress, unspecified: Secondary | ICD-10-CM

## 2020-07-20 DIAGNOSIS — F339 Major depressive disorder, recurrent, unspecified: Secondary | ICD-10-CM

## 2020-07-20 MED ORDER — TRAZODONE HCL 100 MG PO TABS: 100.0000 mg | ORAL_TABLET | Freq: Every day | ORAL | 1 refills | Status: DC

## 2020-07-20 MED ORDER — FLUOXETINE HCL 40 MG PO CAPS: 40.0000 mg | ORAL_CAPSULE | Freq: Every day | ORAL | 3 refills | Status: DC

## 2020-07-20 NOTE — Patient Instructions (Signed)
https://www.nimh.nih.gov/health/publications/depression-what-you-need-to-know/index.shtml">  Depression Screening Depression screening is a tool that your health care provider can use to learn if you have symptoms of depression. Depression is a common condition with many symptoms that are also often found in other conditions. Depression is treatable, but it must first be diagnosed. You may not know that certain feelings, thoughts, and behaviors that you are having can be symptoms of depression. Taking a depression screening test can help you and your health care provider decide if you need more assessment, or if you should be referred to a mental health care provider. What are the screening tests?  You may have a physical exam to see if another condition is affecting your mental health. You may have a blood or urine sample taken during the physical exam.  You may be interviewed using a screening tool that was developed from research, such as one of these: ? Patient Health Questionnaire (PHQ). This is a set of either 2 or 9 questions. A health care provider who has been trained to score this screening test uses a guide to assess if your symptoms suggest that you may have depression. ? Hamilton Depression Rating Scale (HAM-D). This is a set of either 17 or 24 questions. You may be asked to take it again during or after your treatment, to see if your depression has gotten better. ? Beck Depression Inventory (BDI). This is a set of 21 multiple choice questions. Your health care provider scores your answers to assess:  Your level of depression, ranging from mild to severe.  Your response to treatment.  Your health care provider may talk with you about your daily activities, such as eating, sleeping, work, and recreation, and ask if you have had any changes in activity.  Your health care provider may ask you to see a mental health specialist, such as a psychiatrist or psychologist, for more  evaluation. Who should be screened for depression?  All adults, including adults with a family history of a mental health disorder.  Adolescents who are 12-18 years old.  People who are recovering from a myocardial infarction (MI).  Pregnant women, or women who have given birth.  People who have a long-term (chronic) illness.  Anyone who has been diagnosed with another type of a mental health disorder.  Anyone who has symptoms that could show depression.   What do my results mean? Your health care provider will review the results of your depression screening, physical exam, and lab tests. Positive screens suggest that you may have depression. Screening is the first step in getting the care that you may need. It is up to you to get your screening results. Ask your health care provider, or the department that is doing your screening tests, when your results will be ready. Talk with your health care provider about your results and diagnosis. A diagnosis of depression is made using the Diagnostic and Statistical Manual of Mental Disorders (DSM-V). This is a book that lists the number and type of symptoms that must be present for a health care provider to give a specific diagnosis.  Your health care provider may work with you to treat your symptoms of depression, or your health care provider may help you find a mental health provider who can assess, diagnose, and treat your depression. Get help right away if:  You have thoughts about hurting yourself or others. If you ever feel like you may hurt yourself or others, or have thoughts about taking your own life, get help   right away. You can go to your nearest emergency department or call:  Your local emergency services (911 in the [place]  A suicide crisis helpline, such as the National Suicide Prevention Lifeline at 1-800-273-8255. This is open 24 hours a day. Summary  Depression screening is the first step in getting the help that you may  need.  If your screening test shows symptoms of depression (is positive), your health care provider may ask you to see a mental health provider.  Anyone who is age 12 or older should be screened for depression. This information is not intended to replace advice given to you by your health care provider. Make sure you discuss any questions you have with your health care provider. Document Revised: 11/11/2019 Document Reviewed: 11/11/2019 Elsevier Patient Education  2021 Elsevier Inc.  

## 2020-07-27 ENCOUNTER — Telehealth: Payer: Self-pay

## 2020-07-27 NOTE — Telephone Encounter (Signed)
Pt called to cancel appt as she has not taken the medication prescribed since October. Pt stated that while moving she lost the meds. She would like to know if she could have the remaining script sent to [facility] [address] or if she would need to start all the way over.  Please advise.

## 2020-07-31 ENCOUNTER — Ambulatory Visit: Payer: BLUE CROSS/BLUE SHIELD | Admitting: Podiatry

## 2020-07-31 NOTE — Telephone Encounter (Signed)
Patient would need to be seen prior to any additional refills of lamisil. Can you schedule her an appointment? Thank you!

## 2020-07-31 NOTE — Telephone Encounter (Signed)
Pt stated she will call back to schedule an appt as right now is not a good time to schedule.

## 2020-08-10 NOTE — Progress Notes (Signed)
Established patient visit   Patient: [name]   DOB: 09-27-1975   45 y.o. Female  MRN: [id number] Visit Date: 08/11/2020  Today's healthcare provider: Trinna Post, PA-C   Chief Complaint  Patient presents with  . Breast Pain   Subjective    HPI  Breast pain and lumps ongoing for a month. She has tried aleve, tylenol, heating pad, hot showers. Reports nipples are itching. Aunt with breast cancer. No injuries, she reports she fell in the shower but her symptoms predates this injury. She has ovaries. She reports the area towards her nipples moving outwards from there but can be felt over her whole breast. No discharge. No new medications. Caffeine intake: 2  dr. Malachi Bonds 20 ounce bottles daily.   Patient also reports a lesion on her labia that has been present for one year. She reports that it intermittently swells and will release some pus. It will improved but there has been some continuous lesion there. She does wax the area.   Patient Active Problem List   Diagnosis Date Noted  . Recurrent major depressive disorder, remission status unspecified ([place]) 07/20/2020  . BRCA negative 08/10/2018  . IBS (irritable colon syndrome) 02/20/2017  . Intermittent palpitations 11/15/2015  . Insomnia 12/30/2014  . S/P lumbar spinal fusion 05/06/2013  . Headache, migraine 03/28/2007   Past Medical History:  Diagnosis Date  . BRCA negative 07/2018   MyRisk neg  . Family history of breast cancer    IBIS=13%/riskscore=8.4%  . Family history of ovarian cancer   . Family history of pancreatic cancer   . History of frequent urinary tract infections   . Hypoglycemia   . IBS (irritable bowel syndrome)    Social History   Tobacco Use  . Smoking status: Current Every Day Smoker    Packs/day: 0.50    Years: 20.00    Pack years: 10.00    Types: Cigarettes    Last attempt to quit: 05/30/2018    Years since quitting: 2.2  . Smokeless tobacco: Never Used  Vaping Use  . Vaping Use:  Never used  Substance Use Topics  . Alcohol use: No    Alcohol/week: 0.0 standard drinks  . Drug use: No   Allergies  Allergen Reactions  . Codeine Nausea And Vomiting  . Sulfa Antibiotics Rash  . Vicodin [Hydrocodone-Acetaminophen] Nausea Only    And itching all over     Medications: Outpatient Medications Prior to Visit  Medication Sig  . FLUoxetine (PROZAC) 40 MG capsule Take 1 capsule (40 mg total) by mouth daily.  . traZODone (DESYREL) 100 MG tablet Take 1 tablet (100 mg total) by mouth at bedtime.   No facility-administered medications prior to visit.    Review of Systems  Constitutional: Negative.   Genitourinary: Positive for genital sores. Negative for decreased urine volume, difficulty urinating, dysuria, enuresis, flank pain, frequency, hematuria, menstrual problem, vaginal bleeding, vaginal discharge and vaginal pain.  Hematological: Negative for adenopathy.        Objective    BP 113/75 (BP Location: Right Arm, Patient Position: Sitting, Cuff Size: Large)   Pulse 67   Temp 98.4 F (36.9 C) (Oral)   Wt 191 lb (86.6 kg)   BMI 30.83 kg/m    Physical Exam Constitutional:      Appearance: Normal appearance.  Cardiovascular:     Rate and Rhythm: Normal rate.  Skin:    General: Skin is warm and dry.  Neurological:     Mental  Status: She is alert and oriented to person, place, and time. Mental status is at baseline.  Psychiatric:        Mood and Affect: Mood normal.        Behavior: Behavior normal.       No results found for any visits on 08/11/20.  Assessment & Plan     1. Breast pain  Meloxicam as below. No sign of abscess or mastitis. Advised elimination of caffeine. Imaging as below. Can also try primrose oil.  - MM DIAG BREAST TOMO BILATERAL - US BREAST LTD UNI LEFT INC AXILLA - US BREAST LTD UNI RIGHT INC AXILLA - meloxicam (MOBIC) 7.5 MG tablet; Take 1 tablet (7.5 mg total) by mouth daily.  Dispense: 30 tablet; Refill: 0  2. Fibrous  breast lumps   3. Vaginal cyst  Labial cyst, advised persistent but usually benign nature of this. Her insurance does not start until the fall and she would like to wait for GYN referral until then.    Return if symptoms worsen or fail to improve.      ITrinna Post, PA-C, have reviewed all documentation for this visit. The documentation on 08/11/20 for the exam, diagnosis, procedures, and orders are all accurate and complete.  The entirety of the information documented in the History of Present Illness, Review of Systems and Physical Exam were personally obtained by me. Portions of this information were initially documented by Ashley Royalty, CMA and reviewed by me for thoroughness and accuracy.    Paulene Floor  [facility] [phone] (phone) [phone] (fax)  [place]

## 2020-08-11 ENCOUNTER — Encounter: Payer: Self-pay | Admitting: Physician Assistant

## 2020-08-11 ENCOUNTER — Ambulatory Visit (INDEPENDENT_AMBULATORY_CARE_PROVIDER_SITE_OTHER): Payer: Self-pay | Admitting: Physician Assistant

## 2020-08-11 ENCOUNTER — Other Ambulatory Visit: Payer: Self-pay

## 2020-08-11 VITALS — BP 113/75 | HR 67 | Temp 98.4°F | Wt 191.0 lb

## 2020-08-11 DIAGNOSIS — N644 Mastodynia: Secondary | ICD-10-CM

## 2020-08-11 DIAGNOSIS — N63 Unspecified lump in unspecified breast: Secondary | ICD-10-CM

## 2020-08-11 DIAGNOSIS — N898 Other specified noninflammatory disorders of vagina: Secondary | ICD-10-CM

## 2020-08-11 MED ORDER — MELOXICAM 7.5 MG PO TABS: 7.5000 mg | ORAL_TABLET | Freq: Every day | ORAL | 0 refills | Status: DC

## 2020-08-18 ENCOUNTER — Other Ambulatory Visit: Payer: Self-pay

## 2020-08-18 ENCOUNTER — Ambulatory Visit: Admission: RE | Admit: 2020-08-18 | Payer: Self-pay | Source: Ambulatory Visit

## 2020-08-18 ENCOUNTER — Ambulatory Visit
Admission: RE | Admit: 2020-08-18 | Discharge: 2020-08-18 | Disposition: A | Discharge status: Home / Self Care | Payer: Self-pay | Source: Ambulatory Visit | Attending: Physician Assistant | Admitting: Physician Assistant

## 2020-08-18 DIAGNOSIS — N644 Mastodynia: Secondary | ICD-10-CM | POA: Insufficient documentation

## 2020-08-22 ENCOUNTER — Emergency Department
Admission: EM | Admit: 2020-08-22 | Discharge: 2020-08-22 | Disposition: A | Discharge status: Home / Self Care | Payer: Self-pay | Attending: Emergency Medicine | Admitting: Emergency Medicine

## 2020-08-22 ENCOUNTER — Other Ambulatory Visit: Payer: Self-pay

## 2020-08-22 ENCOUNTER — Emergency Department: Payer: Self-pay

## 2020-08-22 DIAGNOSIS — R0789 Other chest pain: Secondary | ICD-10-CM | POA: Insufficient documentation

## 2020-08-22 DIAGNOSIS — F17210 Nicotine dependence, cigarettes, uncomplicated: Secondary | ICD-10-CM | POA: Insufficient documentation

## 2020-08-22 DIAGNOSIS — M62838 Other muscle spasm: Secondary | ICD-10-CM | POA: Insufficient documentation

## 2020-08-22 LAB — BASIC METABOLIC PANEL
Anion gap: 6 (ref 5–15)
BUN: 20 mg/dL (ref 6–20)
CO2: 21 mmol/L — ABNORMAL LOW (ref 22–32)
Calcium: 8.4 mg/dL — ABNORMAL LOW (ref 8.9–10.3)
Chloride: 108 mmol/L (ref 98–111)
Creatinine, Ser: 1.29 mg/dL — ABNORMAL HIGH (ref 0.44–1.00)
GFR, Estimated: 53 mL/min — ABNORMAL LOW (ref >60)
Glucose, Bld: 96 mg/dL (ref 70–99)
Potassium: 4.1 mmol/L (ref 3.5–5.1)
Sodium: 135 mmol/L (ref 135–145)

## 2020-08-22 LAB — CBC
HCT: 37.5 % (ref 36.0–46.0)
Hemoglobin: 12.7 g/dL (ref 12.0–15.0)
MCH: 32 pg (ref 26.0–34.0)
MCHC: 33.9 g/dL (ref 30.0–36.0)
MCV: 94.5 fL (ref 80.0–100.0)
Platelets: 260 10*3/uL (ref 150–400)
RBC: 3.97 MIL/uL (ref 3.87–5.11)
RDW: 12.4 % (ref 11.5–15.5)
WBC: 7.4 10*3/uL (ref 4.0–10.5)
nRBC: 0 % (ref 0.0–0.2)

## 2020-08-22 LAB — TROPONIN I (HIGH SENSITIVITY)
Troponin I (High Sensitivity): <2 ng/L (ref <18)
Troponin I (High Sensitivity): 3 ng/L (ref <18)

## 2020-08-22 MED ORDER — LIDOCAINE HCL (PF) 1 % IJ SOLN
10.0000 mL | Freq: Once | INTRAMUSCULAR | Status: AC
  Administered 2020-08-22: 10 mL
  Filled 2020-08-22: qty 10

## 2020-08-22 MED ORDER — KETOROLAC TROMETHAMINE 30 MG/ML IJ SOLN
15.0000 mg | Freq: Once | INTRAMUSCULAR | Status: AC
  Administered 2020-08-22: 15 mg via INTRAVENOUS
  Filled 2020-08-22: qty 1

## 2020-08-22 NOTE — ED Notes (Signed)
EDP at bedside  

## 2020-08-22 NOTE — Discharge Instructions (Signed)
Please take Tylenol and ibuprofen/Advil for your pain.  It is safe to take them together, or to alternate them every few hours.  Take up to 1000mg of Tylenol at a time, up to 4 times per day.  Do not take more than 4000 mg of Tylenol in 24 hours.  For ibuprofen, take 400-600 mg, 4-5 times per day.  Return to the ED with any worsening symptoms despite these medications. 

## 2020-08-22 NOTE — ED Provider Notes (Signed)
[facility] Emergency Department Provider Note ____________________________________________   Event Date/Time   First MD Initiated Contact with Patient 08/22/20 1800     (approximate)  I have reviewed the triage vital signs and the nursing notes.  HISTORY  Chief Complaint Chest Pain and Back Pain   HPI [name] is a 45 y.o. femalewho presents to the ED for evaluation of chest and back pain.  Chart review indicates history of IBS, depression and insomnia.  No cardiac history noted.  Patient self-reports that she is a lifelong smoker.  She reports seeing a cardiologist one time in the clinic in 2017, had a benign work-up and was instructed that she did not need to follow-up.  No early cardiac death in the family.  Of note, patient reports, tearfully, that her mother is currently in our ICU with a "hole in her intestines" and is not doing very well.  Patient reports developing left-sided trapezius/thoracic back pain, radiating up her left-sided posterior neck, over the past 2-3 days.  She reports the pain coming around her left shoulder and now developing left-sided chest pain over the day today of a similar quality.  She reports aching and tight sensation to her back, shoulder and chest.  She reports taking ibuprofen at home with minimal to no relief of symptoms.  She denies syncopal episodes, increased sputum production or cough, increased shortness of breath   Past Medical History:  Diagnosis Date  . BRCA negative 07/2018   MyRisk neg  . Family history of breast cancer    IBIS=13%/riskscore=8.4%  . Family history of ovarian cancer   . Family history of pancreatic cancer   . History of frequent urinary tract infections   . Hypoglycemia   . IBS (irritable bowel syndrome)     Patient Active Problem List   Diagnosis Date Noted  . Recurrent major depressive disorder, remission status unspecified ([place]) 07/20/2020  . BRCA negative 08/10/2018  . IBS  (irritable colon syndrome) 02/20/2017  . Intermittent palpitations 11/15/2015  . Insomnia 12/30/2014  . S/P lumbar spinal fusion 05/06/2013  . Headache, migraine 03/28/2007    Past Surgical History:  Procedure Laterality Date  . ABDOMINAL HYSTERECTOMY  07/2006   PARTIAL  . BACK SURGERY    . CHOLECYSTECTOMY    . DIAGNOSTIC LAPAROSCOPY     test on bladder  . GALLBLADDER SURGERY    . LAPAROSCOPIC APPENDECTOMY N/A 12/08/2016   Procedure: APPENDECTOMY LAPAROSCOPIC;  Surgeon: Jules Husbands, MD;  Location: [facility] ORS;  Service: General;  Laterality: N/A;  . MAXIMUM ACCESS (MAS)POSTERIOR LUMBAR INTERBODY FUSION (PLIF) 1 LEVEL N/A 05/06/2013   Procedure: LUMBAR FIVE-SACRAL ONE MAXIMUM ACCESS POSTERIOR LUMBAR INTERBODY FUSION;  Surgeon: Eustace Moore, MD;  Location: [facility];  Service: Neurosurgery;  Laterality: N/A;  . TOE SURGERY    . TUBAL LIGATION    . WISDOM TOOTH EXTRACTION      Prior to Admission medications   Medication Sig Start Date End Date Taking? Authorizing Provider  FLUoxetine (PROZAC) 40 MG capsule Take 1 capsule (40 mg total) by mouth daily. 07/20/20   Trinna Post, PA-C  meloxicam (MOBIC) 7.5 MG tablet Take 1 tablet (7.5 mg total) by mouth daily. 08/11/20   Trinna Post, PA-C  traZODone (DESYREL) 100 MG tablet Take 1 tablet (100 mg total) by mouth at bedtime. 07/20/20 01/16/21  Trinna Post, PA-C    Allergies Codeine, Sulfa antibiotics, and Vicodin [hydrocodone-acetaminophen]  Family History  Problem Relation Age of  Onset  . Hypertension Mother   . COPD Mother   . Epilepsy Mother   . Hypertension Father   . Breast cancer Maternal Aunt 42  . Ovarian cancer Maternal Aunt 30  . Colon cancer Paternal Uncle 64  . Pancreatic cancer Maternal Grandmother 24  . Colon cancer Maternal Grandfather 80    Social History Social History   Tobacco Use  . Smoking status: Current Every Day Smoker    Packs/day: 0.50    Years: 20.00    Pack years: 10.00    Types:  Cigarettes    Last attempt to quit: 05/30/2018    Years since quitting: 2.2  . Smokeless tobacco: Never Used  Vaping Use  . Vaping Use: Never used  Substance Use Topics  . Alcohol use: No    Alcohol/week: 0.0 standard drinks  . Drug use: No    Review of Systems  Constitutional: No fever/chills Eyes: No visual changes. ENT: No sore throat. Cardiovascular: Positive for chest pain. Respiratory: Denies shortness of breath. Gastrointestinal: No abdominal pain.  No nausea, no vomiting.  No diarrhea.  No constipation. Genitourinary: Negative for dysuria. Musculoskeletal: Positive for back and left shoulder pain. Skin: Negative for rash. Neurological: Negative for headaches, focal weakness or numbness.  ____________________________________________   PHYSICAL EXAM:  VITAL SIGNS: Vitals:   08/22/20 1930 08/22/20 2000  BP: (!) 142/83 (!) 107/91  Pulse:    Resp: 17 (!) 22  Temp:    SpO2: 98%      Constitutional: Alert and oriented. Well appearing and in no acute distress. Becomes understandably tearful when discussing her mother in the ICU.  Otherwise looks well. Eyes: Conjunctivae are normal. PERRL. EOMI. Head: Atraumatic. Nose: No congestion/rhinnorhea. Mouth/Throat: Mucous membranes are moist.  Oropharynx non-erythematous. Neck: No stridor. No cervical spine tenderness to palpation. Cardiovascular: Normal rate, regular rhythm. Grossly normal heart sounds.  Good peripheral circulation. Respiratory: Normal respiratory effort.  No retractions. Lungs CTAB. Gastrointestinal: Soft , nondistended, nontender to palpation. No CVA tenderness. Musculoskeletal: No lower extremity tenderness nor edema.  No joint effusions. No signs of acute trauma. Tenderness to palpation to her left trapezius musculature primarily.  Palpable muscular cord is noted.  Further tenderness to her left-sided paraspinal thoracic back musculature and her left-sided paraspinal cervical musculature.  No skin  changes or signs of trauma to these areas.  Midline back is without bony tenderness or step-offs. Neurologic:  Normal speech and language. No gross focal neurologic deficits are appreciated. No gait instability noted. Skin:  Skin is warm, dry and intact. No rash noted. Psychiatric: Mood and affect are normal. Speech and behavior are normal.  ____________________________________________   LABS (all labs ordered are listed, but only abnormal results are displayed)  Labs Reviewed  BASIC METABOLIC PANEL - Abnormal; Notable for the following components:      Result Value   CO2 21 (*)    Creatinine, Ser 1.29 (*)    Calcium 8.4 (*)    GFR, Estimated 53 (*)    All other components within normal limits  CBC  TROPONIN I (HIGH SENSITIVITY)  TROPONIN I (HIGH SENSITIVITY)   ____________________________________________  12 Lead EKG  Sinus rhythm, rate of 62 bpm.  Normal axis and intervals.  No evidence of acute ischemia. ____________________________________________  RADIOLOGY  ED MD interpretation: 2 view CXR reviewed by me without evidence of acute cardiopulmonary pathology.  Official radiology report(s): DG Chest 2 View  Result Date: 08/22/2020 CLINICAL DATA:  Chest pain EXAM: CHEST - 2 VIEW COMPARISON:  None. FINDINGS: The heart size and mediastinal contours are within normal limits. Both lungs are clear. The visualized skeletal structures are unremarkable. IMPRESSION: No active cardiopulmonary disease. Electronically Signed   By: Prudencio Pair M.D.   On: 08/22/2020 17:03    ____________________________________________   PROCEDURES and INTERVENTIONS  Procedure(s) performed (including Critical Care):  .1-3 Lead EKG Interpretation Performed by: Vladimir Crofts, MD Authorized by: Vladimir Crofts, MD     Interpretation: normal     ECG rate:  62   ECG rate assessment: normal     Rhythm: sinus rhythm     Ectopy: none     Conduction: normal      Trigger point injection After  discussing risks versus benefits, patient is agreeable to proceed with trigger point injection for treatment of acute muscular spasm. Risks discussed include worsening pain, infection, neurovascular damage and pneumothorax.  Point of maximal tenderness was identified at left paraspinal cervical musculature, left-sided trapezius musculature medially and laterally. Overlying skin was cleaned with alcohol swabs. 1% lidocaine without epinephrine was incrementally injected, after confirming negative drawback, into this musculature. Total of 47mlliliters injected, evenly divided between these 3 areas. Well-tolerated without any apparent complications.   Medications  lidocaine (PF) (XYLOCAINE) 1 % injection 10 mL (10 mLs Infiltration Given by Other 08/22/20 1935)  ketorolac (TORADOL) 30 MG/ML injection 15 mg (15 mg Intravenous Given 08/22/20 1852)    ____________________________________________   MDM / ED COURSE   45year old woman with smoking history, otherwise no significant risk factors for CAD, presents with chest and back pain most consistent with muscular spasm and without evidence of ACS, and amenable to outpatient management.  Normal vitals on room air.  Exam with stigmata of muscular spasm to her left-sided trapezius musculature primarily.  She is understandably upset with discussing her ill mother, but has no neurovascular deficits or evidence of psychiatric emergency.  No signs of trauma.  CXR demonstrates no infiltrates or PTX.  EKG is nonischemic and 2 high-sensitivity troponins are negative.  Patient is provided a single dose of IV Toradol and a trigger point injection, as above, with resolution of her pain.  I see no barriers to outpatient management at this time.  We discussed return precautions for the ED.  Patient stable for outpatient management.  Clinical Course as of 08/22/20 2345  Tue Aug 22, 2020  1919 Trigger point injection performed and is well-tolerated by the patient.  Used  a total of 10 cc of plain lidocaine 1% without epinephrine.  Divided equally to 3 locations: Medial trapezius/left-sided longitudinal thoracic back muscles, left-sided trapezius muscle more laterally, and left-sided paraspinal cervical musculature. [DS]  1950 Reassessed.  Patient reports resolution of pain and is thankful for care.  We discussed return precautions for the ED.  We discussed management of MSK pain at home. [DS]    Clinical Course User Index [DS] SVladimir Crofts MD    ____________________________________________   FINAL CLINICAL IMPRESSION(S) / ED DIAGNOSES  Final diagnoses:  Trapezius muscle spasm  Other chest pain     ED Discharge Orders    None       Kadesha Virrueta STamala Julian  Note:  This document was prepared using Dragon voice recognition software and may include unintentional dictation errors.   SVladimir Crofts MD 08/22/20 [phone]

## 2020-08-22 NOTE — ED Triage Notes (Signed)
BIBA from work c/o back pain and CP. Pain began in back / L shoulder a few days ago radiating to chest. Arrives awake alert tearful but in NAD.   Rec'd 324mg  aspirin, 2 nitro sprays PTA. Pain improved from 10/10 to 6/10. NSR for EMS

## 2020-08-22 NOTE — ED Notes (Signed)
See triage note. Pt came to ED via EMS for CP that started this morning. States CP is dull and constant and goes down L arm. Says was feeling SOB. Pt appears to be in pain but not SOB at this time. Husband at bedside.

## 2020-08-23 ENCOUNTER — Telehealth: Payer: Self-pay

## 2020-08-23 NOTE — Telephone Encounter (Signed)
I called pt and pt verbalized understanding of information below.  

## 2020-08-23 NOTE — Telephone Encounter (Signed)
-----   Message from Berniece Pap, FNP sent at 08/23/2020  3:05 PM EDT ----- Screening mammogram advised in one year unless clinically indicated sooner. Schedule follow up in ofice if symptom persisting for further evaluation.   IMPRESSION: No mammographic evidence of malignancy in the bilateral breasts or other finding to explain the patient's diffuse breast pain.  RECOMMENDATION: 1. Clinical follow-up as needed for the breast pain. We discussed some of the issues regarding breast pain, including limiting caffeine, wearing adequate support, over-the-counter pain medication, vitamin-E supplementation.  2.  Screening mammogram in one year.(Code:SM-B-01Y)  I have discussed the findings and recommendations with the patient. If applicable, a reminder letter will be sent to the patient regarding the next appointment.  BI-RADS CATEGORY  1: Negative.   Electronically Signed   By: Emmaline Kluver M.D.   On: 08/18/2020 16:19

## 2020-08-23 NOTE — Progress Notes (Signed)
Screening mammogram advised in one year unless clinically indicated sooner. Schedule follow up in ofice if symptom persisting for further evaluation.   IMPRESSION: No mammographic evidence of malignancy in the bilateral breasts or other finding to explain the patient's diffuse breast pain.  RECOMMENDATION: 1. Clinical follow-up as needed for the breast pain. We discussed some of the issues regarding breast pain, including limiting caffeine, wearing adequate support, over-the-counter pain medication, vitamin-E supplementation.  2.  Screening mammogram in one year.(Code:SM-B-01Y)  I have discussed the findings and recommendations with the patient. If applicable, a reminder letter will be sent to the patient regarding the next appointment.  BI-RADS CATEGORY  1: Negative.   Electronically Signed   By: Emmaline Kluver M.D.   On: 08/18/2020 16:19

## 2021-01-02 IMAGING — MG DIGITAL DIAGNOSTIC BILATERAL MAMMOGRAM WITH TOMO AND CAD
6 of 10 series · 6 of 30 positions shown · non-contrast
Comparison: Previous exam(s).

CLINICAL DATA: Left breast lower slightly outer quadrant area of
palpable concern felt by the patient.

EXAM:
DIGITAL DIAGNOSTIC BILATERAL MAMMOGRAM WITH CAD AND TOMO
ULTRASOUND BILATERAL BREAST

[L TAN synth-2D]
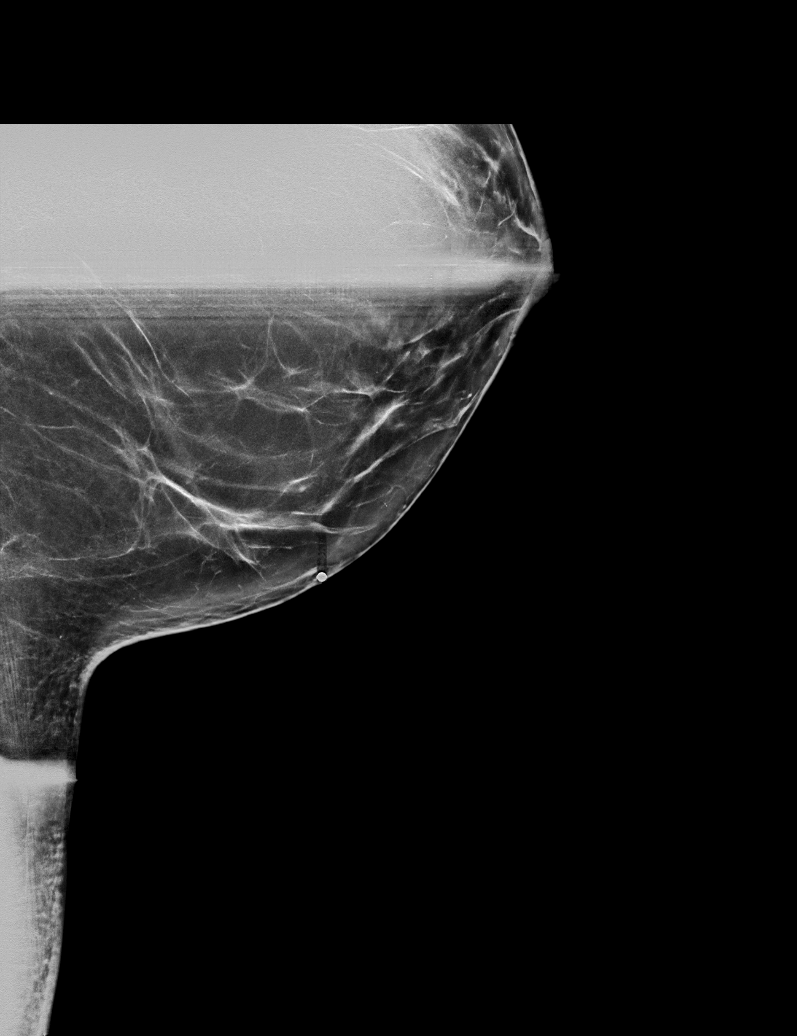

[R CC synth-2D]
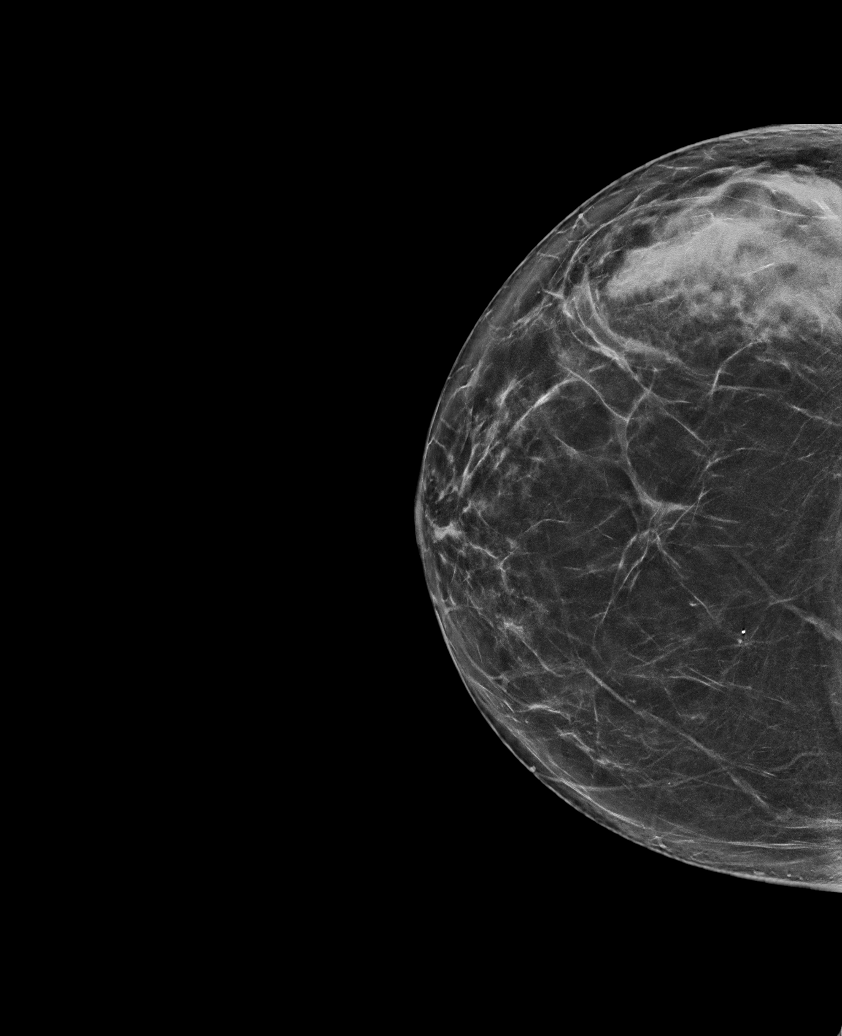

[L MLO synth-2D]
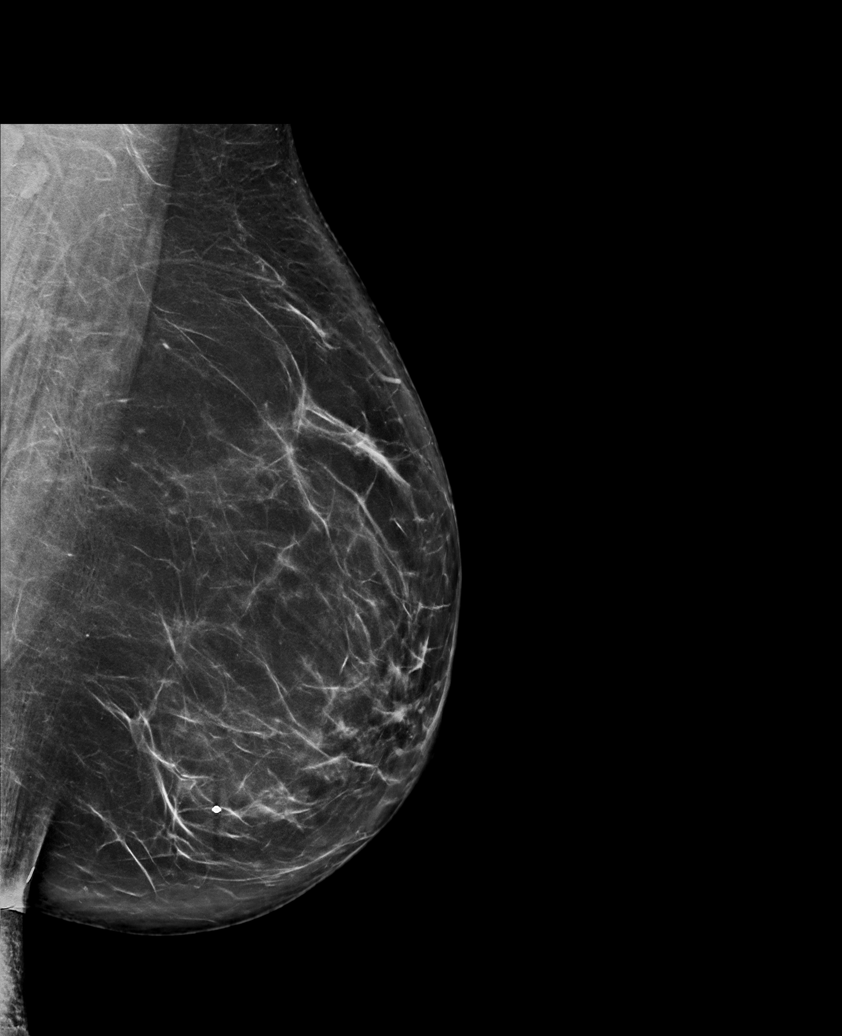

[L CC synth-2D]
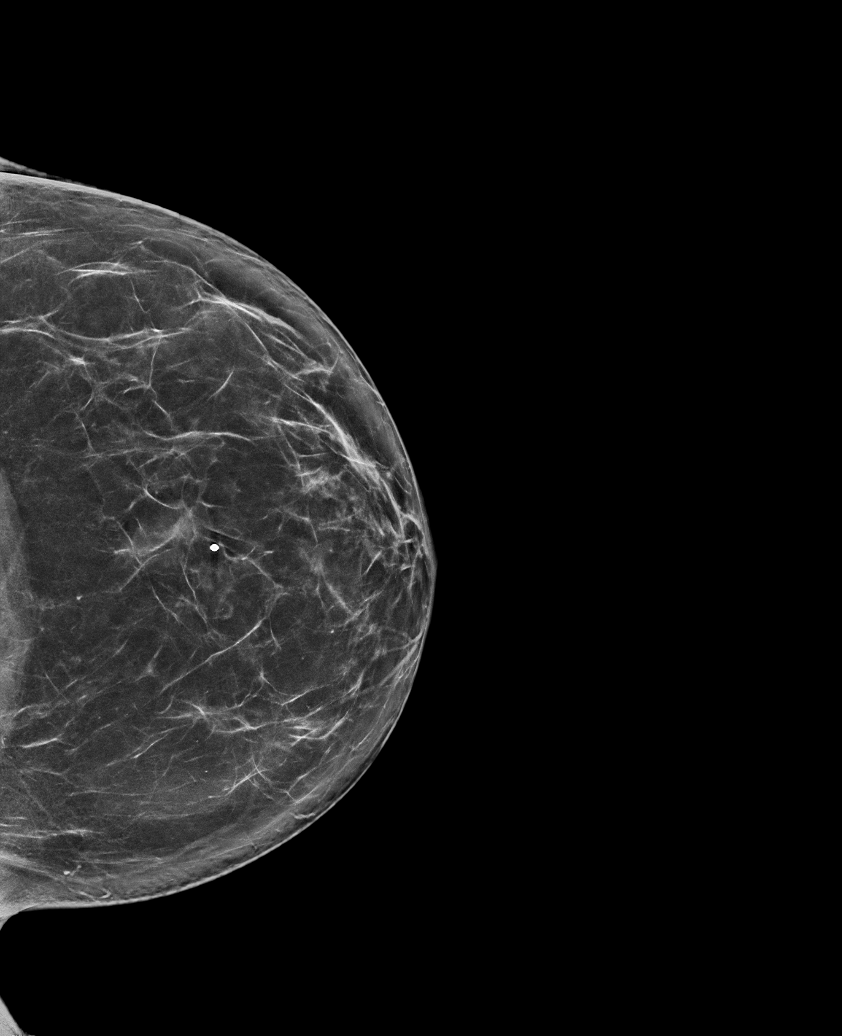

[R MLO synth-2D]
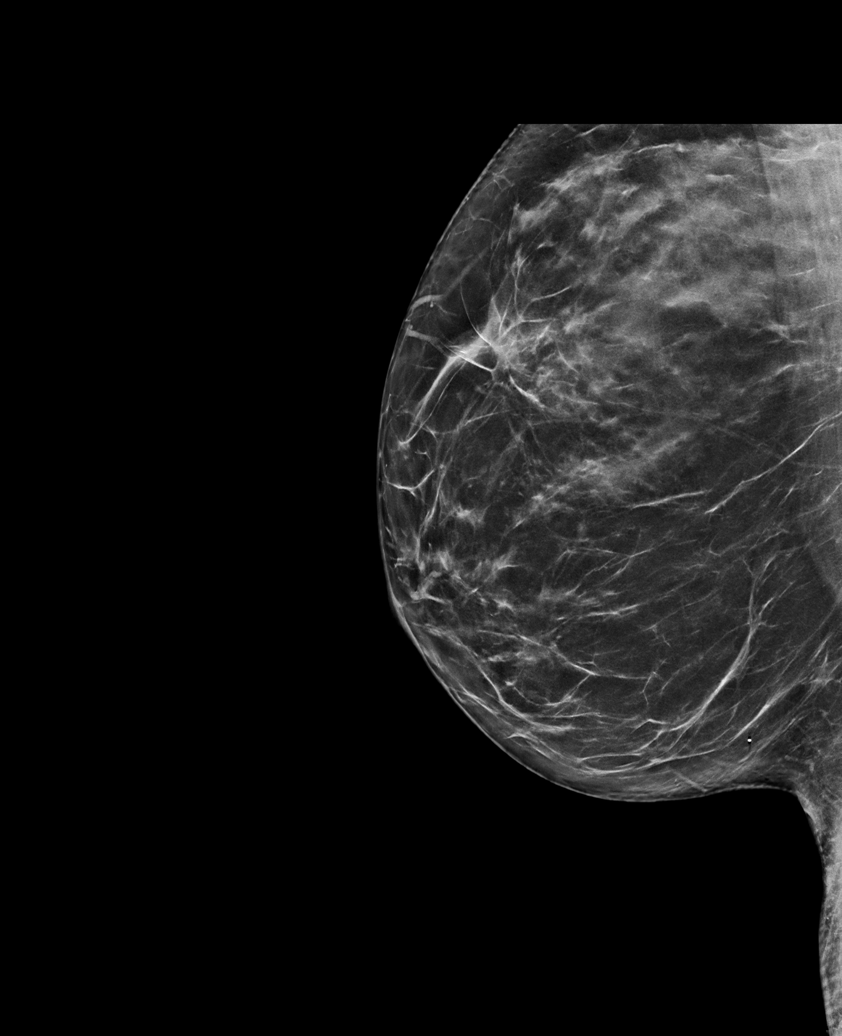

[L MLO tomo · tomo slice 44/87.0]
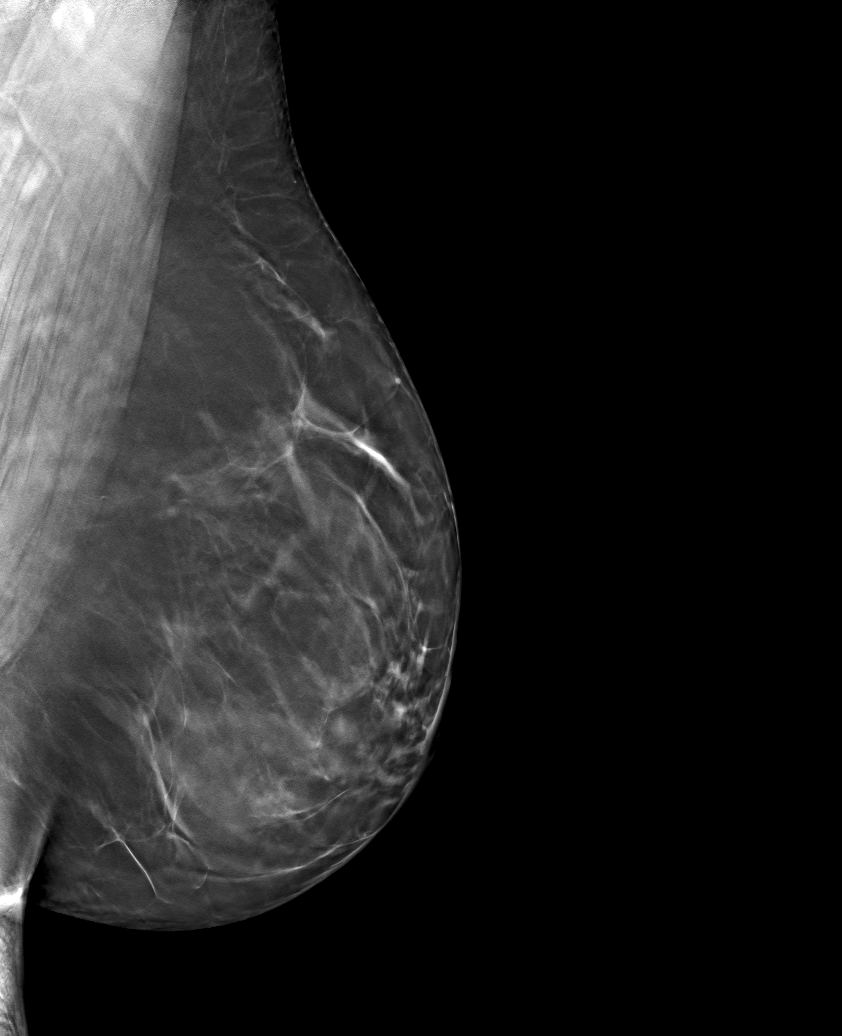

[6 of 30 positions shown; findings below may reference images not displayed]

ACR Breast Density Category b: There are scattered areas of
fibroglandular density.
FINDINGS: Mammographically, there are no suspicious masses, areas of
architectural distortion or microcalcifications in the left breast.
BB marker indicates the area of palpable concern.

There is a more prominent focal asymmetry in the right breast upper
outer quadrant, posterior depth, with appearance of dense
fibroglandular tissue.

Mammographic images were processed with CAD.

On physical exam, no suspicious masses are palpated in either
breast.

Targeted left breast ultrasound is performed, showing no suspicious
masses or shadowing lesions. No correlation to the area of palpable
concern in patient's lower outer quadrant.

Targeted right breast ultrasound is performed, showing no suspicious
masses or shadowing lesions. Dense breast parenchyma correlates to
the focal asymmetry seen in the upper outer quadrant of the right
breast.
IMPRESSION: No mammographic sonographic evidence of malignancy in either breast.

RECOMMENDATION:
Further management of patient's area of palpable concern in her left
breast should be based on clinical grounds.

Otherwise, recommend screening mammogram in one year.(Code:QC-G-NB4)

I have discussed the findings and recommendations with the patient.
Results were also provided in writing at the conclusion of the
visit. If applicable, a reminder letter will be sent to the patient
regarding the next appointment.

BI-RADS CATEGORY  2: Benign.

## 2021-01-15 ENCOUNTER — Other Ambulatory Visit: Payer: Self-pay | Admitting: Physician Assistant

## 2021-01-15 DIAGNOSIS — G4700 Insomnia, unspecified: Secondary | ICD-10-CM

## 2021-01-15 NOTE — Telephone Encounter (Signed)
[facility] faxed refill request for the following medications:   traZODone (DESYREL) 100 MG tablet   Please advise.

## 2021-01-16 MED ORDER — TRAZODONE HCL 100 MG PO TABS: 100.0000 mg | ORAL_TABLET | Freq: Every day | ORAL | 1 refills | Status: DC

## 2021-02-19 ENCOUNTER — Ambulatory Visit: Payer: Self-pay | Admitting: *Deleted

## 2021-02-19 ENCOUNTER — Encounter: Payer: Self-pay | Admitting: *Deleted

## 2021-02-19 ENCOUNTER — Other Ambulatory Visit: Payer: Self-pay

## 2021-02-19 ENCOUNTER — Ambulatory Visit (INDEPENDENT_AMBULATORY_CARE_PROVIDER_SITE_OTHER): Payer: Self-pay | Admitting: Family Medicine

## 2021-02-19 ENCOUNTER — Encounter: Payer: Self-pay | Admitting: Family Medicine

## 2021-02-19 VITALS — BP 119/80 | HR 61 | Temp 98.5°F | Resp 16

## 2021-02-19 DIAGNOSIS — L03211 Cellulitis of face: Secondary | ICD-10-CM

## 2021-02-19 DIAGNOSIS — T63301A Toxic effect of unspecified spider venom, accidental (unintentional), initial encounter: Secondary | ICD-10-CM

## 2021-02-19 MED ORDER — AMOXICILLIN-POT CLAVULANATE 875-125 MG PO TABS: 1.0000 | ORAL_TABLET | Freq: Two times a day (BID) | ORAL | 0 refills | Status: DC

## 2021-02-19 NOTE — Telephone Encounter (Signed)
Patient is calling to report possible spider bite- rash/red area increasing in size and pain on R side of face- since Saturday.  Call to office- call transferred for scheduling.

## 2021-02-19 NOTE — Progress Notes (Signed)
I,[name],acting as a scribe for [name], MD.,have documented all relevant documentation on the behalf of [name], MD,as directed by  [name], MD while in the presence of [name], MD.   Established patient visit   Patient: [name]   DOB: [birth year]   45 y.o. Female  MRN: [id number] Visit Date: 02/19/2021  Today's healthcare provider: [name], MD   Chief Complaint  Patient presents with   Insect Bite   Subjective    HPI  Patient has a insect bite or bump on her right cheek. Patient states the bump appeared 2 days ago, when she woke up. Patient states the bump is very painful. Pain is a burning sensation. Patient also states that she has pain in her right ear that radiates down the right side of her neck. Patient also has a red splotch that showed up on her left calf. Patient states that the red splotch does not itch and is not painful. Patient also states she has noticed slight swelling in her ankles since other symptoms began.  Fever or chills.  Just pain and tenderness over the area.    Medications: Outpatient Medications Prior to Visit  Medication Sig   FLUoxetine (PROZAC) 40 MG capsule Take 1 capsule (40 mg total) by mouth daily.   traZODone (DESYREL) 100 MG tablet Take 1 tablet (100 mg total) by mouth at bedtime.   [DISCONTINUED] meloxicam (MOBIC) 7.5 MG tablet Take 1 tablet (7.5 mg total) by mouth daily.   No facility-administered medications prior to visit.    Review of Systems     Objective    BP 119/80 (BP Location: Right Arm, Patient Position: Sitting, Cuff Size: Normal)   Pulse 61   Temp 98.5 F (36.9 C) (Oral)   Resp 16   SpO2 97%  BP Readings from Last 3 Encounters:  02/19/21 119/80  08/22/20 (!) 107/91  08/11/20 113/75   Wt Readings from Last 3 Encounters:  08/22/20 187 lb (84.8 kg)  08/11/20 191 lb (86.6 kg)  07/20/20 187 lb 8 oz (85 kg)      Physical Exam Vitals reviewed.   HENT:     Head: Normocephalic and atraumatic.     Right Ear: External ear normal.     Left Ear: External ear normal.     Mouth/Throat:     Pharynx: Oropharynx is clear.  Eyes:     General: No scleral icterus.    Conjunctiva/sclera: Conjunctivae normal.  Cardiovascular:     Heart sounds: Normal heart sounds.  Pulmonary:     Effort: Pulmonary effort is normal.     Breath sounds: Normal breath sounds.  Skin:    General: Skin is warm and dry.     Comments: The right cheek has an area in the middle of the cheek that is like a bug bite.  Slightly raised and tender and has surrounding mild erythema.  Erythema is not well demarcated.  It only extends a few millimeters.  Neurological:     General: No focal deficit present.     Mental Status: She is alert and oriented to person, place, and time.  Psychiatric:        Mood and Affect: Mood normal.        Behavior: Behavior normal.        Thought Content: Thought content normal.        Judgment: Judgment normal.      No results found for  any visits on 02/19/21.  Assessment & Plan     1. Cellulitis of face Treat as a cellulitis. - amoxicillin-clavulanate (AUGMENTIN) 875-125 MG tablet; Take 1 tablet by mouth 2 (two) times daily.  Dispense: 14 tablet; Refill: 0  2. Spider bite wound, accidental or unintentional, initial encounter Think the etiology of this is probably a spider bite.  Need reevaluation if it worsens.   No follow-ups on file.      I, [name], MD, have reviewed all documentation for this visit. The documentation on 02/24/21 for the exam, diagnosis, procedures, and orders are all accurate and complete.    Brayleigh Rybacki Wendelyn Breslow, MD  [facility] [phone] (phone) [phone] (fax)  [facility]

## 2021-02-19 NOTE — Telephone Encounter (Signed)
Reason for Disposition  [1] Looks infected (spreading redness, pus) AND [2] large red area (> 2 in. or 5 cm)  Answer Assessment - Initial Assessment Questions 1. APPEARANCE of RASH: "Describe the rash."      One bump- face is red and swollen- cheek -jaw to cheek 2. LOCATION: "Where is the rash located?"      R side of face 3. NUMBER: "How many spots are there?"      1 4. SIZE: "How big are the spots?" (Inches, centimeters or compare to size of a coin)      Size of dime- now as large as tennis ball 5. ONSET: "When did the rash start?"      Saturday 6. ITCHING: "Does the rash itch?" If Yes, ask: "How bad is the itch?"  (Scale 0-10; or none, mild, moderate, severe)     At first 7. PAIN: "Does the rash hurt?" If Yes, ask: "How bad is the pain?"  (Scale 0-10; or none, mild, moderate, severe)    - NONE (0): no pain    - MILD (1-3): doesn't interfere with normal activities     - MODERATE (4-7): interferes with normal activities or awakens from sleep     - SEVERE (8-10): excruciating pain, unable to do any normal activities     moderate 8. OTHER SYMPTOMS: "Do you have any other symptoms?" (e.g., fever)     Bite on the leg 9. PREGNANCY: "Is there any chance you are pregnant?" "When was your last menstrual period?"     NA  Protocols used: Rash or Redness - Localized-A-AH

## 2021-02-21 ENCOUNTER — Telehealth: Payer: Self-pay

## 2021-02-21 NOTE — Telephone Encounter (Signed)
Copied from CRM [id number]. Topic: Appointment Scheduling - Scheduling Inquiry for Clinic >> Feb 21, 2021 10:28 AM Marylen Ponto wrote: Reason for CRM: Pt would like to schedule fu appt for spider bite. Pt stated the antibiotic is not helping and she feels like it is getting worse. Pt requests call back to schedule appt with pcp.

## 2021-02-22 NOTE — Telephone Encounter (Signed)
Patient was seen earlier this week. She reports that the Augmentin is not helping. Any recommendations?

## 2021-02-22 NOTE — Telephone Encounter (Signed)
Patient was just seen in office by Dr. Sullivan Lone 9/19 and just started on Augmentin 875-125mg , [facility] does not have any openings please advise if Dr. Sullivan Lone can fit patient in. [phone]

## 2021-02-22 NOTE — Telephone Encounter (Signed)
Advised patient. She reports that she is starting to feel better. She does not feel another abx is needed. If not improving, she will let us know.

## 2021-05-23 ENCOUNTER — Encounter: Payer: Self-pay | Admitting: Family Medicine

## 2021-05-23 ENCOUNTER — Ambulatory Visit (INDEPENDENT_AMBULATORY_CARE_PROVIDER_SITE_OTHER): Payer: Self-pay | Admitting: Family Medicine

## 2021-05-23 ENCOUNTER — Other Ambulatory Visit: Payer: Self-pay

## 2021-05-23 VITALS — BP 146/93 | HR 76 | Temp 98.5°F | Resp 16 | Wt 178.7 lb

## 2021-05-23 DIAGNOSIS — R051 Acute cough: Secondary | ICD-10-CM

## 2021-05-23 DIAGNOSIS — J0140 Acute pansinusitis, unspecified: Secondary | ICD-10-CM | POA: Insufficient documentation

## 2021-05-23 DIAGNOSIS — R599 Enlarged lymph nodes, unspecified: Secondary | ICD-10-CM

## 2021-05-23 DIAGNOSIS — R030 Elevated blood-pressure reading, without diagnosis of hypertension: Secondary | ICD-10-CM | POA: Insufficient documentation

## 2021-05-23 MED ORDER — PREDNISONE 20 MG PO TABS: 20.0000 mg | ORAL_TABLET | Freq: Every day | ORAL | 0 refills | Status: DC

## 2021-05-23 MED ORDER — GUAIFENESIN-DM 100-10 MG/5ML PO SYRP: 10.0000 mL | ORAL_SOLUTION | ORAL | 1 refills | Status: DC | PRN

## 2021-05-23 MED ORDER — AZITHROMYCIN 250 MG PO TABS: ORAL_TABLET | ORAL | 0 refills | Status: AC

## 2021-05-23 MED ORDER — GUAIFENESIN-CODEINE 100-10 MG/5ML PO SOLN: 10.0000 mL | Freq: Every evening | ORAL | 0 refills | Status: DC | PRN

## 2021-05-23 MED ORDER — DOXYCYCLINE HYCLATE 100 MG PO TABS: 100.0000 mg | ORAL_TABLET | Freq: Two times a day (BID) | ORAL | 0 refills | Status: DC

## 2021-05-23 NOTE — Assessment & Plan Note (Signed)
Continue to monitor when patient is not ill

## 2021-05-23 NOTE — Progress Notes (Signed)
Established patient visit   Patient: [name]   DOB: 08-30-1975   45 y.o. Female  MRN: [id number] Visit Date: 05/23/2021  Today's healthcare provider: [name], FNP   Chief Complaint  Patient presents with   Sore Throat   Subjective    Sore Throat  This is a new problem. The current episode started more than 1 month ago. Associated symptoms include coughing, diarrhea, headaches, a hoarse voice, a plugged ear sensation, neck pain, swollen glands, trouble swallowing and vomiting. Pertinent negatives include no abdominal pain, congestion, drooling, ear discharge, ear pain, shortness of breath or stridor. She has had no exposure to strep or mono. Treatments tried: Amoxicillin. The treatment provided no relief.    Medications: Outpatient Medications Prior to Visit  Medication Sig   FLUoxetine (PROZAC) 40 MG capsule Take 1 capsule (40 mg total) by mouth daily.   traZODone (DESYREL) 100 MG tablet Take 1 tablet (100 mg total) by mouth at bedtime.   [DISCONTINUED] amoxicillin-clavulanate (AUGMENTIN) 875-125 MG tablet Take 1 tablet by mouth 2 (two) times daily.   [DISCONTINUED] amoxicillin (AMOXIL) 875 MG tablet Take 875 mg by mouth 2 (two) times daily.   No facility-administered medications prior to visit.    Review of Systems  HENT:  Positive for hoarse voice and trouble swallowing. Negative for congestion, drooling, ear discharge and ear pain.   Respiratory:  Positive for cough. Negative for shortness of breath and stridor.   Gastrointestinal:  Positive for diarrhea and vomiting. Negative for abdominal pain.  Musculoskeletal:  Positive for neck pain.  Neurological:  Positive for headaches.      Objective    BP (!) 146/93    Pulse 76    Temp 98.5 F (36.9 C) (Oral)    Resp 16    Wt 178 lb 11.2 oz (81.1 kg)    SpO2 99%    BMI 28.84 kg/m    Physical Exam Vitals and nursing note reviewed.  Constitutional:      General: She is not in acute distress.    Appearance:  Normal appearance. She is well-developed and overweight. She is not ill-appearing, toxic-appearing or diaphoretic.  HENT:     Head: Normocephalic and atraumatic.     Jaw: There is normal jaw occlusion.     Right Ear: Hearing, tympanic membrane, ear canal and external ear normal. Tenderness present.     Left Ear: Hearing, tympanic membrane, ear canal and external ear normal. Tenderness present.     Nose:     Right Turbinates: Enlarged.     Left Turbinates: Enlarged.     Right Sinus: Maxillary sinus tenderness and frontal sinus tenderness present.     Left Sinus: Maxillary sinus tenderness and frontal sinus tenderness present.     Mouth/Throat:     Mouth: Mucous membranes are moist.     Tongue: No lesions.     Palate: No mass.     Pharynx: Oropharynx is clear. No pharyngeal swelling, oropharyngeal exudate, posterior oropharyngeal erythema or uvula swelling.     Tonsils: No tonsillar exudate or tonsillar abscesses.  Cardiovascular:     Rate and Rhythm: Normal rate and regular rhythm.     Pulses: Normal pulses.     Heart sounds: Normal heart sounds. No murmur heard.   No friction rub. No gallop.  Pulmonary:     Effort: Pulmonary effort is normal. No respiratory distress.     Breath sounds: Normal breath sounds. No stridor. No wheezing, rhonchi or  rales.  Chest:     Chest wall: No tenderness.  Abdominal:     General: Bowel sounds are normal.     Palpations: Abdomen is soft.  Musculoskeletal:        General: No swelling, deformity or signs of injury. Normal range of motion.     Cervical back: Neck supple. Tenderness present.     Right lower leg: No edema.     Left lower leg: No edema.  Lymphadenopathy:     Cervical: Cervical adenopathy present.  Skin:    General: Skin is warm and dry.     Capillary Refill: Capillary refill takes less than 2 seconds.     Coloration: Skin is not jaundiced or pale.     Findings: No bruising, erythema, lesion or rash.  Neurological:     General: No  focal deficit present.     Mental Status: She is alert and oriented to person, place, and time. Mental status is at baseline.     Cranial Nerves: No cranial nerve deficit.     Sensory: No sensory deficit.     Motor: No weakness.     Coordination: Coordination normal.  Psychiatric:        Mood and Affect: Mood normal.        Behavior: Behavior normal.        Thought Content: Thought content normal.        Judgment: Judgment normal.     No results found for any visits on 05/23/21.  Assessment & Plan     Problem List Items Addressed This Visit       Respiratory   Acute non-recurrent pansinusitis - Primary   Relevant Medications   azithromycin (ZITHROMAX) 250 MG tablet   doxycycline (VIBRA-TABS) 100 MG tablet   predniSONE (DELTASONE) 20 MG tablet   guaiFENesin-dextromethorphan (ROBITUSSIN DM) 100-10 MG/5ML syrup   guaiFENesin-codeine 100-10 MG/5ML syrup     Immune and Lymphatic   Glands swollen   Relevant Medications   predniSONE (DELTASONE) 20 MG tablet     Other   Acute cough   Relevant Medications   guaiFENesin-dextromethorphan (ROBITUSSIN DM) 100-10 MG/5ML syrup   guaiFENesin-codeine 100-10 MG/5ML syrup   Elevated blood pressure reading in office without diagnosis of hypertension    Continue to monitor when patient is not ill        Return if symptoms worsen or fail to improve.      Leilani Merl, FNP, have reviewed all documentation for this visit. The documentation on 05/23/21 for the exam, diagnosis, procedures, and orders are all accurate and complete.    [name], FNP  [facility] [phone] (phone) [phone] (fax)  [facility]

## 2021-06-15 ENCOUNTER — Other Ambulatory Visit: Payer: Self-pay | Admitting: Family Medicine

## 2021-06-15 DIAGNOSIS — G4700 Insomnia, unspecified: Secondary | ICD-10-CM

## 2021-06-15 MED ORDER — TRAZODONE HCL 100 MG PO TABS: 100.0000 mg | ORAL_TABLET | Freq: Every day | ORAL | 0 refills | Status: DC

## 2021-06-15 NOTE — Telephone Encounter (Signed)
Copied from CRM [id number]. Topic: Quick Communication - Rx Refill/Question >> Jun 15, 2021 10:34 AM Gaetana Michaelis A wrote: Medication: traZODone (DESYREL) 50 MG tablet [[id number]]    Has the patient contacted their pharmacy? No. This will be patient's first time using this pharmacy  (Agent: If no, request that the patient contact the pharmacy for the refill. If patient does not wish to contact the pharmacy document the reason why and proceed with request.) (Agent: If yes, when and what did the pharmacy advise?)  Preferred Pharmacy (with phone number or street name): [facility] - Adline Peals, [place] - [address] [place] [address] Phone: [phone] Fax: [phone]   Has the patient been seen for an appointment in the last year OR does the patient have an upcoming appointment? Yes.    Agent: Please be advised that RX refills may take up to 3 business days. We ask that you follow-up with your pharmacy.

## 2021-06-15 NOTE — Telephone Encounter (Signed)
Requested Prescriptions  Pending Prescriptions Disp Refills   traZODone (DESYREL) 100 MG tablet 90 tablet 0    Sig: Take 1 tablet (100 mg total) by mouth at bedtime.     Psychiatry: Antidepressants - Serotonin Modulator Passed - 06/15/2021 11:09 AM      Passed - Completed PHQ-2 or PHQ-9 in the last 360 days      Passed - Valid encounter within last 6 months    Recent Outpatient Visits          3 weeks ago Acute non-recurrent pansinusitis   [facility] [name], FNP   3 months ago Cellulitis of face   [facility] Maple Hudson., MD   10 months ago Breast pain   [facility] Osvaldo Angst M, [place]   11 months ago Depression, unspecified depression type   [facility] [place], Lavella Hammock, [place]   1 year ago Stanton and fatigue   [facility], Jodell Cipro, [place]

## 2021-08-03 ENCOUNTER — Other Ambulatory Visit: Payer: Self-pay

## 2021-08-03 ENCOUNTER — Ambulatory Visit
Admission: RE | Admit: 2021-08-03 | Discharge: 2021-08-03 | Disposition: A | Discharge status: Home / Self Care | Payer: Self-pay | Source: Ambulatory Visit | Attending: Physician Assistant | Admitting: Physician Assistant

## 2021-08-03 ENCOUNTER — Other Ambulatory Visit: Payer: Self-pay | Admitting: Family Medicine

## 2021-08-03 VITALS — BP 124/75 | HR 82 | Temp 99.0°F | Resp 18 | Ht 66.0 in | Wt 178.8 lb

## 2021-08-03 DIAGNOSIS — R051 Acute cough: Secondary | ICD-10-CM

## 2021-08-03 DIAGNOSIS — J020 Streptococcal pharyngitis: Secondary | ICD-10-CM

## 2021-08-03 DIAGNOSIS — F439 Reaction to severe stress, unspecified: Secondary | ICD-10-CM

## 2021-08-03 DIAGNOSIS — R5383 Other fatigue: Secondary | ICD-10-CM

## 2021-08-03 DIAGNOSIS — U071 COVID-19: Secondary | ICD-10-CM

## 2021-08-03 LAB — GROUP A STREP BY PCR: Group A Strep by PCR: DETECTED — AB

## 2021-08-03 MED ORDER — FLUOXETINE HCL 40 MG PO CAPS: 40.0000 mg | ORAL_CAPSULE | Freq: Every day | ORAL | 0 refills | Status: DC

## 2021-08-03 MED ORDER — AMOXICILLIN-POT CLAVULANATE 875-125 MG PO TABS: 1.0000 | ORAL_TABLET | Freq: Two times a day (BID) | ORAL | 0 refills | Status: AC

## 2021-08-03 MED ORDER — GUAIFENESIN-CODEINE 100-10 MG/5ML PO SYRP: 10.0000 mL | ORAL_SOLUTION | Freq: Three times a day (TID) | ORAL | 0 refills | Status: DC | PRN

## 2021-08-03 NOTE — ED Provider Notes (Signed)
[facility]    CSN: [id number] Arrival date & time: 08/03/21  8891      History   Chief Complaint Chief Complaint  Patient presents with   Covid Positive   Appointment    HPI [name] is a 46 y.o. female presenting for approximately 1 week history of cough.  Patient reports she felt like she had allergies until couple days ago when she started feeling worse, more fatigued, fevers, chills.  Over the past couple of days she started to have a severe sore throat and painful swallowing as well as pain radiating to the right ear.  Denies breathing trouble or diarrhea.  Reports that she cannot eat or drink much because her throat hurts so badly.  She did have a positive COVID test x2 at home since being sick.  She has been taking ibuprofen but no other medication for symptoms.  Patient is otherwise healthy without any significant medical problems.  No other complaints.  HPI  Past Medical History:  Diagnosis Date   BRCA negative 07/2018   MyRisk neg   Family history of breast cancer    IBIS=13%/riskscore=8.4%   Family history of ovarian cancer    Family history of pancreatic cancer    History of frequent urinary tract infections    Hypoglycemia    IBS (irritable bowel syndrome)     Patient Active Problem List   Diagnosis Date Noted   Acute non-recurrent pansinusitis 05/23/2021   Acute cough 05/23/2021   Glands swollen 05/23/2021   Elevated blood pressure reading in office without diagnosis of hypertension 05/23/2021   Recurrent major depressive disorder, remission status unspecified ([place]) 07/20/2020   BRCA negative 08/10/2018   IBS (irritable colon syndrome) 02/20/2017   Intermittent palpitations 11/15/2015   Insomnia 12/30/2014   S/P lumbar spinal fusion 05/06/2013   Headache, migraine 03/28/2007    Past Surgical History:  Procedure Laterality Date   ABDOMINAL HYSTERECTOMY  07/2006   PARTIAL   BACK SURGERY     CHOLECYSTECTOMY     DIAGNOSTIC  LAPAROSCOPY     test on bladder   GALLBLADDER SURGERY     LAPAROSCOPIC APPENDECTOMY N/A 12/08/2016   Procedure: APPENDECTOMY LAPAROSCOPIC;  Surgeon: Jules Husbands, MD;  Location: [facility] ORS;  Service: General;  Laterality: N/A;   MAXIMUM ACCESS (MAS)POSTERIOR LUMBAR INTERBODY FUSION (PLIF) 1 LEVEL N/A 05/06/2013   Procedure: LUMBAR FIVE-SACRAL ONE MAXIMUM ACCESS POSTERIOR LUMBAR INTERBODY FUSION;  Surgeon: Eustace Moore, MD;  Location: [facility];  Service: Neurosurgery;  Laterality: N/A;   TOE SURGERY     TUBAL LIGATION     WISDOM TOOTH EXTRACTION      OB History     Gravida  2   Para  2   Term      Preterm      AB      Living  2      SAB      IAB      Ectopic      Multiple      Live Births               Home Medications    Prior to Admission medications   Medication Sig Start Date End Date Taking? Authorizing Provider  amoxicillin-clavulanate (AUGMENTIN) 875-125 MG tablet Take 1 tablet by mouth every 12 (twelve) hours for 10 days. 08/03/21 08/13/21 Yes Danton Clap, PA-C  FLUoxetine (PROZAC) 40 MG capsule Take 1 capsule (40 mg total) by mouth daily. 07/20/20  Yes Carles Collet M, PA-C  guaiFENesin-codeine (ROBITUSSIN AC) 100-10 MG/5ML syrup Take 10 mLs by mouth 3 (three) times daily as needed for cough. 08/03/21  Yes Danton Clap, PA-C  traZODone (DESYREL) 100 MG tablet Take 1 tablet (100 mg total) by mouth at bedtime. 06/15/21 12/12/21 Yes Gwyneth Sprout, FNP    Family History Family History  Problem Relation Age of Onset   Hypertension Mother    COPD Mother    Epilepsy Mother    Hypertension Father    Breast cancer Maternal Aunt 39   Ovarian cancer Maternal Aunt 45   Colon cancer Paternal Uncle 22   Pancreatic cancer Maternal Grandmother 56   Colon cancer Maternal Grandfather 63    Social History Social History   Tobacco Use   Smoking status: Every Day    Packs/day: 0.50    Years: 20.00    Pack years: 10.00    Types: Cigarettes    Last  attempt to quit: 05/30/2018    Years since quitting: 3.1   Smokeless tobacco: Never  Vaping Use   Vaping Use: Never used  Substance Use Topics   Alcohol use: No    Alcohol/week: 0.0 standard drinks   Drug use: No     Allergies   Codeine, Sulfa antibiotics, and Vicodin [hydrocodone-acetaminophen]   Review of Systems Review of Systems  Constitutional:  Positive for appetite change and fatigue. Negative for chills, diaphoresis and fever.  HENT:  Positive for congestion, ear pain, rhinorrhea and sore throat. Negative for sinus pressure and sinus pain.   Respiratory:  Positive for cough. Negative for shortness of breath.   Cardiovascular:  Negative for chest pain.  Gastrointestinal:  Negative for abdominal pain, nausea and vomiting.  Musculoskeletal:  Negative for arthralgias and myalgias.  Skin:  Negative for rash.  Neurological:  Negative for weakness and headaches.  Hematological:  Negative for adenopathy.    Physical Exam Triage Vital Signs ED Triage Vitals  Enc Vitals Group     BP 08/03/21 1015 124/75     Pulse Rate 08/03/21 1015 82     Resp 08/03/21 1015 18     Temp 08/03/21 1015 99 F (37.2 C)     Temp Source 08/03/21 1015 Oral     SpO2 08/03/21 1015 96 %     Weight 08/03/21 1012 178 lb 12.7 oz (81.1 kg)     Height 08/03/21 1012 '5\' 6"'  (1.676 m)     Head Circumference --      Peak Flow --      Pain Score 08/03/21 1012 7     Pain Loc --      Pain Edu? --      Excl. in [place]? --    No data found.  Updated Vital Signs BP 124/75 (BP Location: Right Arm)    Pulse 82    Temp 99 F (37.2 C) (Oral)    Resp 18    Ht '5\' 6"'  (1.676 m)    Wt 178 lb 12.7 oz (81.1 kg)    SpO2 96%    BMI 28.86 kg/m   Physical Exam Vitals and nursing note reviewed.  Constitutional:      General: She is not in acute distress.    Appearance: Normal appearance. She is ill-appearing. She is not toxic-appearing.  HENT:     Head: Normocephalic and atraumatic.     Right Ear: Tympanic membrane,  ear canal and external ear normal.     Left Ear: Tympanic membrane,  ear canal and external ear normal.     Nose: Congestion present.     Mouth/Throat:     Mouth: Mucous membranes are moist.     Pharynx: Oropharynx is clear. Posterior oropharyngeal erythema (moderate erythema and swelling posterior pharynx) present.  Eyes:     General: No scleral icterus.       Right eye: No discharge.        Left eye: No discharge.     Conjunctiva/sclera: Conjunctivae normal.  Cardiovascular:     Rate and Rhythm: Normal rate and regular rhythm.     Heart sounds: Normal heart sounds.  Pulmonary:     Effort: Pulmonary effort is normal. No respiratory distress.     Breath sounds: Normal breath sounds.  Musculoskeletal:     Cervical back: Neck supple.  Skin:    General: Skin is dry.  Neurological:     General: No focal deficit present.     Mental Status: She is alert. Mental status is at baseline.     Motor: No weakness.     Gait: Gait normal.  Psychiatric:        Mood and Affect: Mood normal.        Behavior: Behavior normal.        Thought Content: Thought content normal.     UC Treatments / Results  Labs (all labs ordered are listed, but only abnormal results are displayed) Labs Reviewed  GROUP A STREP BY PCR - Abnormal; Notable for the following components:      Result Value   Group A Strep by PCR DETECTED (*)    All other components within normal limits    EKG   Radiology No results found.  Procedures Procedures (including critical care time)  Medications Ordered in UC Medications - No data to display  Initial Impression / Assessment and Plan / UC Course  I have reviewed the triage vital signs and the nursing notes.  Pertinent labs & imaging results that were available during my care of the patient were reviewed by me and considered in my medical decision making (see chart for details).   46 year old female presenting for severe sore throat, cough and congestion.  Symptom  onset 1 week ago.  Positive COVID home test x2.  Vitals normal stable.  Patient ill-appearing but nontoxic.  She does have nasal congestion, moderate to significant erythema and mild to moderate swelling of posterior pharynx.  Chest clear to auscultation and heart regular rate and rhythm.  PCR strep test obtained.  Positive test.  Discussed with patient.  Treating with Augmentin.  Offered viscous lidocaine.  She says she has that at home.  Also encouraged use of Chloraseptic spray, ibuprofen and/or Tylenol for pain relief.  Sent Cheratussin as well for the cough since coughing hurts her throat as well.  Reviewed controlled substance database and find patient to be low risk for abuse.  She does have allergy listed to codeine but it is more of an intolerance causing her a little bit of nausea.  She took it a couple of months ago and did fine with it.  Thoroughly reviewed return and ER precautions as well as current CDC guidelines.  Work note given.   Final Clinical Impressions(s) / UC Diagnoses   Final diagnoses:  Strep pharyngitis  COVID-19  Acute cough  Other fatigue     Discharge Instructions      -Your strep test is positive.  You also have COVID-19.  In emergency COVID-19 you should  be isolated 5 days from symptom onset and then wear a mask for 5 days.  It is imperative that you wear a mask for the next 3 to 4 days at least. - You should be having improvement of your sore throat over the next couple days after starting antibiotics.  Finish course. - I have also sent cough medicine which you have had before and done well with.  Can also use viscous lidocaine, Chloraseptic spray, cough drops, ibuprofen and or Tylenol for discomfort.  Make sure to stay hydrated and rest.     ED Prescriptions     Medication Sig Dispense Auth. Provider   amoxicillin-clavulanate (AUGMENTIN) 875-125 MG tablet Take 1 tablet by mouth every 12 (twelve) hours for 10 days. 20 tablet Danton Clap, PA-C    guaiFENesin-codeine (ROBITUSSIN AC) 100-10 MG/5ML syrup Take 10 mLs by mouth 3 (three) times daily as needed for cough. 118 mL Danton Clap, PA-C      I have reviewed the PDMP during this encounter.   Danton Clap, PA-C 08/03/21 1056

## 2021-08-03 NOTE — ED Triage Notes (Signed)
Pt states she tested positive for covid yesterday. She is c/o sore throat, cough, sweats, chills, right ear pain, and subjective fever. Started about 2 days ago.   ?

## 2021-08-03 NOTE — Discharge Instructions (Signed)
-  Your strep test is positive.  You also have COVID-19.  In emergency COVID-19 you should be isolated 5 days from symptom onset and then wear a mask for 5 days.  It is imperative that you wear a mask for the next 3 to 4 days at least. ?- You should be having improvement of your sore throat over the next couple days after starting antibiotics.  Finish course. ?- I have also sent cough medicine which you have had before and done well with.  Can also use viscous lidocaine, Chloraseptic spray, cough drops, ibuprofen and or Tylenol for discomfort.  Make sure to stay hydrated and rest. ?

## 2021-08-03 NOTE — Telephone Encounter (Signed)
[facility] faxed refill request for the following medications: ? ? ?FLUoxetine (PROZAC) 40 MG capsule  ? ?Please advise. ? ?

## 2021-08-23 ENCOUNTER — Other Ambulatory Visit: Payer: Self-pay | Admitting: Family Medicine

## 2021-08-23 DIAGNOSIS — G4700 Insomnia, unspecified: Secondary | ICD-10-CM

## 2021-09-03 ENCOUNTER — Encounter: Payer: Self-pay | Admitting: Family Medicine

## 2021-09-03 ENCOUNTER — Ambulatory Visit (INDEPENDENT_AMBULATORY_CARE_PROVIDER_SITE_OTHER): Payer: 59 | Admitting: Family Medicine

## 2021-09-03 VITALS — BP 144/84 | HR 74 | Temp 98.4°F | Resp 16 | Ht 66.5 in | Wt 169.6 lb

## 2021-09-03 DIAGNOSIS — Z1231 Encounter for screening mammogram for malignant neoplasm of breast: Secondary | ICD-10-CM

## 2021-09-03 DIAGNOSIS — Z720 Tobacco use: Secondary | ICD-10-CM

## 2021-09-03 DIAGNOSIS — Z0000 Encounter for general adult medical examination without abnormal findings: Secondary | ICD-10-CM

## 2021-09-03 DIAGNOSIS — R519 Headache, unspecified: Secondary | ICD-10-CM

## 2021-09-03 DIAGNOSIS — G8929 Other chronic pain: Secondary | ICD-10-CM

## 2021-09-03 DIAGNOSIS — R0683 Snoring: Secondary | ICD-10-CM

## 2021-09-03 DIAGNOSIS — N393 Stress incontinence (female) (male): Secondary | ICD-10-CM

## 2021-09-03 DIAGNOSIS — F439 Reaction to severe stress, unspecified: Secondary | ICD-10-CM

## 2021-09-03 DIAGNOSIS — F339 Major depressive disorder, recurrent, unspecified: Secondary | ICD-10-CM | POA: Diagnosis not present

## 2021-09-03 DIAGNOSIS — R030 Elevated blood-pressure reading, without diagnosis of hypertension: Secondary | ICD-10-CM

## 2021-09-03 DIAGNOSIS — M5442 Lumbago with sciatica, left side: Secondary | ICD-10-CM

## 2021-09-03 MED ORDER — BUPROPION HCL ER (XL) 150 MG PO TB24: 150.0000 mg | ORAL_TABLET | Freq: Every day | ORAL | 1 refills | Status: DC

## 2021-09-03 NOTE — Assessment & Plan Note (Signed)
Chronic, worsening ?Works seated work all day ?Limited stretching/exercise d/t depression ?Previous hx of fusion; now with recurrent s/s ?Has been using NSAIDs OTC to assist ?Encouraged decrease use of ETOH with use of NSAIDs ?Will refer to ortho for further assistance ?

## 2021-09-03 NOTE — Assessment & Plan Note (Signed)
Due for screening for mammogram, denies breast concerns, provided with phone number to call and schedule appointment for mammogram. Encouraged to repeat breast cancer screening every 1-2 years.  

## 2021-09-03 NOTE — Assessment & Plan Note (Signed)
Chronic; remains pre-contemplative regarding reduction/cessation of use ?1800QUITNOW is a resource that may benefit the patient; will discuss at further follow ups ?

## 2021-09-03 NOTE — Assessment & Plan Note (Signed)
Chronic, worsening ?Has previously been seen by urology, reported having PVR completed ?Will refer to PT for assistance in recovery of pelvic floor muscles ?Associated with urinary leakage with cough, sneezing etc ?

## 2021-09-03 NOTE — Progress Notes (Signed)
? ?[employer] as a Education administrator for Gwyneth Sprout, FNP.,have documented all relevant documentation on the behalf of Gwyneth Sprout, FNP,as directed by  Gwyneth Sprout, FNP while in the presence of Gwyneth Sprout, FNP.  ? ?Complete physical exam ? ? ?Patient: [name]   DOB: [birth year]   46 y.o. Female  MRN: [id number] ?Visit Date: 09/03/2021 ? ?Today's healthcare provider: Gwyneth Sprout, FNP  ? ?Re Introduced to nurse practitioner role and practice setting.  All questions answered.  Discussed provider/patient relationship and expectations. ? ? ?Chief Complaint  ?Patient presents with  ? Annual Exam  ? ?Subjective  ?  ?HPI  ?[name] is a 46 y.o. female who presents today for a complete physical exam.  ?She reports consuming a poor diet 1,2 meals a days  ? " Sometimes I only eat 3-4 bites of food". The patient does not participate in regular exercise at present. She generally feels poorly, patient states that her voice is wraspy and reports cough for 3 months or more that can cause vomiting at times. She reports sleeping poorly, patient reports being under stress and states that she wakes up every morning with headache and sore throat. She does have additional problems to discuss today.  ? ?Last reported ?Pap-hystrectomy ?[place]- 08/18/20 ? ?Past Medical History:  ?Diagnosis Date  ? BRCA negative 07/2018  ? MyRisk neg  ? Family history of breast cancer   ? IBIS=13%/riskscore=8.4%  ? Family history of ovarian cancer   ? Family history of pancreatic cancer   ? History of frequent urinary tract infections   ? Hypoglycemia   ? IBS (irritable bowel syndrome)   ? ?Past Surgical History:  ?Procedure Laterality Date  ? ABDOMINAL HYSTERECTOMY  07/2006  ? PARTIAL  ? BACK SURGERY    ? CHOLECYSTECTOMY    ? DIAGNOSTIC LAPAROSCOPY    ? test on bladder  ? GALLBLADDER SURGERY    ? LAPAROSCOPIC APPENDECTOMY N/A 12/08/2016  ? Procedure: APPENDECTOMY LAPAROSCOPIC;  Surgeon: Jules Husbands, MD;  Location: [facility] ORS;   Service: General;  Laterality: N/A;  ? MAXIMUM ACCESS (MAS)POSTERIOR LUMBAR INTERBODY FUSION (PLIF) 1 LEVEL N/A 05/06/2013  ? Procedure: LUMBAR FIVE-SACRAL ONE MAXIMUM ACCESS POSTERIOR LUMBAR INTERBODY FUSION;  Surgeon: Eustace Moore, MD;  Location: [facility];  Service: Neurosurgery;  Laterality: N/A;  ? TOE SURGERY    ? TUBAL LIGATION    ? WISDOM TOOTH EXTRACTION    ? ?Social History  ? ?Socioeconomic History  ? Marital status: Legally Separated  ?  Spouse name: Not on file  ? Number of children: Not on file  ? Years of education: Not on file  ? Highest education level: Not on file  ?Occupational History  ? Not on file  ?Tobacco Use  ? Smoking status: Every Day  ?  Packs/day: 0.50  ?  Years: 20.00  ?  Pack years: 10.00  ?  Types: Cigarettes  ?  Last attempt to quit: 05/30/2018  ?  Years since quitting: 3.2  ? Smokeless tobacco: Never  ?Vaping Use  ? Vaping Use: Never used  ?Substance and Sexual Activity  ? Alcohol use: No  ?  Alcohol/week: 0.0 standard drinks  ? Drug use: No  ? Sexual activity: Yes  ?  Partners: Female, Female  ?  Birth control/protection: Surgical  ?  Comment: Hysterectomy  ?Other Topics Concern  ? Not on file  ?Social History Narrative  ? Not on file  ? ?Social Determinants  of Health  ? ?Financial Resource Strain: Not on file  ?Food Insecurity: Not on file  ?Transportation Needs: Not on file  ?Physical Activity: Not on file  ?Stress: Not on file  ?Social Connections: Not on file  ?Intimate Partner Violence: Not on file  ? ?Family Status  ?Relation Name Status  ? Mother  Alive  ? Father  Alive  ? Mat Aunt  Deceased  ? Mat Aunt  Deceased  ? Annamarie Major  Deceased  ? MGM  Deceased  ? MGF  Deceased  ? ?Family History  ?Problem Relation Age of Onset  ? Hypertension Mother   ? COPD Mother   ? Epilepsy Mother   ? Hypertension Father   ? Breast cancer Maternal Aunt 42  ? Ovarian cancer Maternal Aunt 3  ? Colon cancer Paternal Uncle 69  ? Pancreatic cancer Maternal Grandmother 81  ? Colon cancer Maternal  Grandfather 8  ? ?Allergies  ?Allergen Reactions  ? Codeine Nausea And Vomiting  ? Sulfa Antibiotics Rash  ? Vicodin [Hydrocodone-Acetaminophen] Nausea Only  ?  And itching all over  ?  ?Patient Care Team: ?Gwyneth Sprout, FNP as PCP - General (Family Medicine)  ? ?Medications: ?Outpatient Medications Prior to Visit  ?Medication Sig  ? FLUoxetine (PROZAC) 40 MG capsule Take 1 capsule (40 mg total) by mouth daily. Need office visit for additional refills.  ? traZODone (DESYREL) 100 MG tablet TAKE 1 TABLET BY MOUTH AT BEDTIME  ? valACYclovir (VALTREX) 500 MG tablet Take 500 mg by mouth 3 (three) times daily.  ? [DISCONTINUED] guaiFENesin-codeine (ROBITUSSIN AC) 100-10 MG/5ML syrup Take 10 mLs by mouth 3 (three) times daily as needed for cough.  ? ?No facility-administered medications prior to visit.  ? ? ?Review of Systems  ?Constitutional:  Positive for appetite change and fatigue.  ?HENT:  Positive for sore throat and voice change.   ?Respiratory:  Positive for apnea and cough.   ?Gastrointestinal:  Positive for diarrhea and nausea.  ?Genitourinary:  Positive for enuresis.  ?Musculoskeletal:  Positive for back pain and gait problem.  ?Neurological:  Positive for numbness and headaches.  ?Psychiatric/Behavioral:  Positive for agitation, dysphoric mood and sleep disturbance. The patient is nervous/anxious.   ? ? ? Objective  ?  ?BP (!) 144/84   Pulse 74   Temp 98.4 ?F (36.9 ?C) (Temporal)   Resp 16   Ht 5' 6.5" (1.689 m)   Wt 169 lb 9.6 oz (76.9 kg)   SpO2 99%   BMI 26.96 kg/m?  ? ? ? ?Physical Exam ?Vitals and nursing note reviewed.  ?Constitutional:   ?   General: She is awake. She is not in acute distress. ?   Appearance: Normal appearance. She is well-developed, well-groomed and overweight. She is not ill-appearing, toxic-appearing or diaphoretic.  ?HENT:  ?   Head: Normocephalic and atraumatic.  ?   Jaw: There is normal jaw occlusion. No trismus, tenderness, swelling or pain on movement.  ?   Right Ear:  Hearing, tympanic membrane, ear canal and external ear normal. There is no impacted cerumen.  ?   Left Ear: Hearing, tympanic membrane, ear canal and external ear normal. There is no impacted cerumen.  ?   Nose: Nose normal. No congestion or rhinorrhea.  ?   Right Turbinates: Not enlarged, swollen or pale.  ?   Left Turbinates: Not enlarged, swollen or pale.  ?   Right Sinus: No maxillary sinus tenderness or frontal sinus tenderness.  ?   Left  Sinus: No maxillary sinus tenderness or frontal sinus tenderness.  ?   Mouth/Throat:  ?   Lips: Pink.  ?   Mouth: Mucous membranes are moist. No injury.  ?   Tongue: No lesions.  ?   Pharynx: Oropharynx is clear. Uvula midline. No pharyngeal swelling, oropharyngeal exudate, posterior oropharyngeal erythema or uvula swelling.  ?   Tonsils: No tonsillar exudate or tonsillar abscesses.  ?Eyes:  ?   General: Lids are normal. Lids are everted, no foreign bodies appreciated. Vision grossly intact. Gaze aligned appropriately. No allergic shiner or visual field deficit.    ?   Right eye: No discharge.     ?   Left eye: No discharge.  ?   Extraocular Movements: Extraocular movements intact.  ?   Conjunctiva/sclera: Conjunctivae normal.  ?   Right eye: Right conjunctiva is not injected. No exudate. ?   Left eye: Left conjunctiva is not injected. No exudate. ?   Pupils: Pupils are equal, round, and reactive to light.  ?Neck:  ?   Thyroid: No thyroid mass, thyromegaly or thyroid tenderness.  ?   Vascular: No carotid bruit.  ?   Trachea: Trachea normal.  ?Cardiovascular:  ?   Rate and Rhythm: Normal rate and regular rhythm.  ?   Pulses: Normal pulses.     ?     Carotid pulses are 2+ on the right side and 2+ on the left side. ?     Radial pulses are 2+ on the right side and 2+ on the left side.  ?     Dorsalis pedis pulses are 2+ on the right side and 2+ on the left side.  ?     Posterior tibial pulses are 2+ on the right side and 2+ on the left side.  ?   Heart sounds: Normal heart sounds,  S1 normal and S2 normal. No murmur heard. ?  No friction rub. No gallop.  ?Pulmonary:  ?   Effort: Pulmonary effort is normal. No respiratory distress.  ?   Breath sounds: Normal breath sounds and air entry. No stri

## 2021-09-03 NOTE — Assessment & Plan Note (Signed)
Associated with snoring ?Recommend OSA follow up with [facility] for further assistance ?Hx of excessive ETOH and borderline BP today as well ? ?

## 2021-09-03 NOTE — Assessment & Plan Note (Signed)
Chronic, worsening ?separation from husband, now in apartment ?Does not see kids for months ?Upcoming wedding for son ?Reports excessive ETOH use and poor motivation ?

## 2021-09-03 NOTE — Assessment & Plan Note (Signed)
Patient tearful in room; decreased during stay ?However, recommend if BP is elevated in 2 months at anxiety/depression f/u will recommend use of anti-hypertensive  ?Provided with information on DASH diet thru virtual visit summary ?

## 2021-09-03 NOTE — Assessment & Plan Note (Signed)
Chronic, recurrent, major ?Will add additionally Rx Wellbutrin today ?Continue Prozac at 40 mg ?Will engage use of CCM ?Recommend decrease use of ETOH ?Recommend use of SMART goals to assist in setting up apartment ?Pt denies impact of ETOH use on work; however, continues to report poor sleep patterns ?RTC in 2 months ?Contracted to safety; denies SI or HI ?

## 2021-09-03 NOTE — Assessment & Plan Note (Signed)
Chronic, worsening ?Will send for OSA referral ?Pt reports that she wakes herself up snoring ?Complaints of daytime headache as well ?

## 2021-09-03 NOTE — Assessment & Plan Note (Signed)
Has dental appt scheduled ?Does not have eye insurance; however, is saving to have eye visit ?Things to do to keep yourself healthy  ?- Exercise at least 30-45 minutes a day, 3-4 days a week.  ?- Eat a low-fat diet with lots of fruits and vegetables, up to 7-9 servings per day.  ?- Seatbelts can save your life. Wear them always.  ?- Smoke detectors on every level of your home, check batteries every year.  ?- Eye Doctor - have an eye exam every 1-2 years  ?- Safe sex - if you may be exposed to STDs, use a condom.  ?- Alcohol -  If you drink, do it moderately, less than 2 drinks per day.  ?- Health Care Power of [place]. Choose someone to speak for you if you are not able.  ?- Depression is common in our stressful world.If you're feeling down or losing interest in things you normally enjoy, please come in for a visit.  ?- Violence - If anyone is threatening or hurting you, please call immediately. ? ? ?

## 2021-09-04 LAB — COMPREHENSIVE METABOLIC PANEL
ALT: 29 IU/L (ref 0–32)
AST: 28 IU/L (ref 0–40)
Albumin/Globulin Ratio: 2 (ref 1.2–2.2)
Albumin: 4.9 g/dL — ABNORMAL HIGH (ref 3.8–4.8)
Alkaline Phosphatase: 88 IU/L (ref 44–121)
BUN/Creatinine Ratio: 11 (ref 9–23)
BUN: 8 mg/dL (ref 6–24)
Bilirubin Total: 0.3 mg/dL (ref 0.0–1.2)
CO2: 24 mmol/L (ref 20–29)
Calcium: 9.8 mg/dL (ref 8.7–10.2)
Chloride: 99 mmol/L (ref 96–106)
Creatinine, Ser: 0.71 mg/dL (ref 0.57–1.00)
Globulin, Total: 2.4 g/dL (ref 1.5–4.5)
Glucose: 100 mg/dL — ABNORMAL HIGH (ref 70–99)
Potassium: 4.8 mmol/L (ref 3.5–5.2)
Sodium: 137 mmol/L (ref 134–144)
Total Protein: 7.3 g/dL (ref 6.0–8.5)
eGFR: 108 mL/min/{1.73_m2} (ref >59)

## 2021-09-04 LAB — CBC WITH DIFFERENTIAL/PLATELET
Basophils Absolute: 0.1 10*3/uL (ref 0.0–0.2)
Basos: 1 %
EOS (ABSOLUTE): 0.1 10*3/uL (ref 0.0–0.4)
Eos: 1 %
Hematocrit: 41.6 % (ref 34.0–46.6)
Hemoglobin: 14.6 g/dL (ref 11.1–15.9)
Immature Grans (Abs): 0.1 10*3/uL (ref 0.0–0.1)
Immature Granulocytes: 1 %
Lymphocytes Absolute: 1.6 10*3/uL (ref 0.7–3.1)
Lymphs: 15 %
MCH: 32.6 pg (ref 26.6–33.0)
MCHC: 35.1 g/dL (ref 31.5–35.7)
MCV: 93 fL (ref 79–97)
Monocytes Absolute: 0.8 10*3/uL (ref 0.1–0.9)
Monocytes: 8 %
Neutrophils Absolute: 7.8 10*3/uL — ABNORMAL HIGH (ref 1.4–7.0)
Neutrophils: 74 %
Platelets: 312 10*3/uL (ref 150–450)
RBC: 4.48 x10E6/uL (ref 3.77–5.28)
RDW: 11.8 % (ref 11.7–15.4)
WBC: 10.3 10*3/uL (ref 3.4–10.8)

## 2021-09-04 LAB — HEMOGLOBIN A1C
Est. average glucose Bld gHb Est-mCnc: 103 mg/dL
Hgb A1c MFr Bld: 5.2 % (ref 4.8–5.6)

## 2021-09-04 LAB — LIPID PANEL
Chol/HDL Ratio: 1.7 ratio (ref 0.0–4.4)
Cholesterol, Total: 193 mg/dL (ref 100–199)
HDL: 111 mg/dL (ref >39)
LDL Chol Calc (NIH): 70 mg/dL (ref 0–99)
Triglycerides: 68 mg/dL (ref 0–149)
VLDL Cholesterol Cal: 12 mg/dL (ref 5–40)

## 2021-09-04 LAB — TSH+FREE T4
Free T4: 1.32 ng/dL (ref 0.82–1.77)
TSH: 1.34 u[IU]/mL (ref 0.450–4.500)

## 2021-09-05 ENCOUNTER — Telehealth: Payer: Self-pay | Admitting: *Deleted

## 2021-09-05 NOTE — Chronic Care Management (AMB) (Signed)
?  Care Management  ? ?Note ? ?09/05/2021 ?Name: [name] MRN: [id number] DOB: 02-Mar-1976 ? ?[name] is a 46 y.o. year old female who is a primary care patient of Gwyneth Sprout, FNP. I reached out to [facility] by phone today offer care coordination services.  ? ?[name] was given information about care management services today including:  ?Care management services include personalized support from designated clinical staff supervised by her physician, including individualized plan of care and coordination with other care providers ?24/7 contact phone numbers for assistance for urgent and routine care needs. ?The patient may stop care management services at any time by phone call to the office staff. ? ?Patient agreed to services and verbal consent obtained.  ? ?Follow up plan: ?Telephone appointment with care management team member scheduled for: 09/18/2021 ? ?[name], CCMA ?Care Guide, Embedded Care Coordination ?[place]  Care Management  ?Direct Dial: [phone] ? ? ?

## 2021-09-18 ENCOUNTER — Telehealth: Payer: Self-pay | Admitting: *Deleted

## 2021-09-18 ENCOUNTER — Ambulatory Visit: Payer: 59 | Admitting: *Deleted

## 2021-09-18 DIAGNOSIS — F439 Reaction to severe stress, unspecified: Secondary | ICD-10-CM

## 2021-09-18 DIAGNOSIS — F339 Major depressive disorder, recurrent, unspecified: Secondary | ICD-10-CM

## 2021-09-18 NOTE — Patient Instructions (Addendum)
Visit Information ? ?Thank you for taking time to visit with me today. Please don't hesitate to contact me if I can be of assistance to you before our next scheduled telephone appointment. ? ?Following are the goals we discussed today:  ?- begin personal counseling ?- talk about feelings with a friend, family or spiritual advisor ?- practice positive thinking and self-talk ?Our next appointment is by telephone on 09/25/21 at 1pm ? ?Please call the care guide team at [phone] if you need to cancel or reschedule your appointment.  ? ?If you are experiencing a Mental Health or Behavioral Health Crisis or need someone to talk to, please call the Suicide and Crisis Lifeline: 988  ? ?Following is a copy of your full plan of care:  ?Care Plan : General Social Work (Adult)  ?Updates made by Doris Overland, LCSW since 09/18/2021 12:00 AM  ?  ? ?Problem: CHL AMB "PATIENT-SPECIFIC PROBLEM"   ?Note:   ?CARE PLAN ENTRY ?(see longitudinal plan of care for additional care plan information) ? ?Current Barriers:  ?Knowledge deficits related to accessing mental health provider in patient with Anxiety and Situational Stress ?Patient is experiencing symptoms of  anxiety and depression which seem to be exacerbated by recent separation form her spouse.  Patient also  "empty nesting"   ?Patient needs Support, Education, and Care Coordination in order to meet unmet mental health needs  ?Mental Health Concerns  ? ?Clinical Social Work Goal(s):  ?Over the next 90 days, patient will work with SW bi-weekly by telephone or in person to reduce or manage symptoms of anxiety and stress until connected for ongoing counseling resources.  ?Patient will implement clinical interventions discussed today to decreases symptoms of anxiety and increase knowledge and/or ability of: self-management skills. ? ?Interventions:  ?Assessed patient's understanding, education, previous treatment and care coordination needs  ?Patient interviewed and appropriate  assessments performed: PHQ 2 ?PHQ 9 ?Provided basic mental health support, education and interventions  ?Collaborated with appropriate clinical care team members regarding patient needs ?Discussed options for long term counseling based on need and insurance. This Child psychotherapist is agreeable to identifying in network providers for long term follow up ?Reviewed mental health medications with patient prescribed by PCP and discussed compliance  ?Other interventions include: PHQ2/ PHQ9 completed ?Active listening / Reflection utilized  ?Emotional Support Provided  ? ?Patient Self Care Activities & Deficits:  ?Patient is unable to independently navigate community resource options without care coordination support ?Patient is able to implement clinical interventions discussed today and is motivated for treatment  ?Patient will select one of the agencies from the list that will be provided and call to schedule an appointment  ?Performs ADL's independently ?Performs IADL's independently ?Ability for insight ?Motivation for treatment ? ?Initial goal documentation ? ?  ? ? ?[name] was given information about Care Management services by the embedded care coordination team including:  ?Care Management services include personalized support from designated clinical staff supervised by her physician, including individualized plan of care and coordination with other care providers ?24/7 contact phone numbers for assistance for urgent and routine care needs. ?The patient may stop CCM services at any time (effective at the end of the month) by phone call to the office staff. ? ?Patient agreed to services and verbal consent obtained.  ? ?The patient verbalized understanding of instructions, educational materials, and care plan provided today and declined offer to receive copy of patient instructions, educational materials, and care plan.  ? ?Telephone follow up appointment  with care management team member scheduled for:  09/25/21 ? ? ?Doris Tenbrink, LCSW ?Clinical Social Worker  ?[facility] Care Management ?[phone] ? ? ?  ?

## 2021-09-18 NOTE — Telephone Encounter (Signed)
?  Care Management  ? ?Follow Up Note ? ? ?09/18/2021 ?Name: [name] MRN: [id number] DOB: 11/02/1975 ? ? ?Referred by: Gwyneth Sprout, FNP ?Reason for referral : No chief complaint on file. ? ? ?An unsuccessful telephone outreach was attempted today. The patient was referred to the case management team for assistance with care management and care coordination.  ? ?Follow Up Plan: Telephone follow up appointment with care management team member to be re-scheduled by careguide: ? ? ?Kellyn Mansfield, LCSW ?Clinical Social Worker  ?[place] Management ?[phone] ? ? ?

## 2021-09-18 NOTE — Chronic Care Management (AMB) (Signed)
Care Management ?Clinical Social Work Note ? ?09/18/2021 ?Name: [name] MRN: [id number] DOB: [birth year] ? ?[name] is a 46 y.o. year old female who is a primary care patient of Gwyneth Sprout, FNP.  The Care Management team was consulted for assistance with chronic disease management and coordination needs. ? ?Engaged with patient by telephone for initial visit in response to provider referral for social work chronic care management and care coordination services ? ?Consent to Services:  ?Ms. Men was given information about Care Management services today including:  ?Care Management services includes personalized support from designated clinical staff supervised by her physician, including individualized plan of care and coordination with other care providers ?24/7 contact phone numbers for assistance for urgent and routine care needs. ?The patient may stop case management services at any time by phone call to the office staff. ? ?Patient agreed to services and consent obtained.  ? ?Assessment: Review of patient past medical history, allergies, medications, and health status, including review of relevant consultants reports was performed today as part of a comprehensive evaluation and provision of chronic care management and care coordination services. ? ?SDOH (Social Determinants of Health) assessments and interventions performed:   ? ?Advanced Directives Status: Not addressed in this encounter. ? ?Care Plan ? ?Allergies  ?Allergen Reactions  ? Codeine Nausea And Vomiting  ? Sulfa Antibiotics Rash  ? Vicodin [Hydrocodone-Acetaminophen] Nausea Only  ?  And itching all over  ? ? ?Outpatient Encounter Medications as of 09/18/2021  ?Medication Sig  ? buPROPion (WELLBUTRIN XL) 150 MG 24 hr tablet Take 1 tablet (150 mg total) by mouth daily.  ? FLUoxetine (PROZAC) 40 MG capsule Take 1 capsule (40 mg total) by mouth daily. Need office visit for additional refills.  ? traZODone (DESYREL) 100 MG tablet TAKE 1  TABLET BY MOUTH AT BEDTIME  ? valACYclovir (VALTREX) 500 MG tablet Take 500 mg by mouth 3 (three) times daily.  ? ?No facility-administered encounter medications on file as of 09/18/2021.  ? ? ?Patient Active Problem List  ? Diagnosis Date Noted  ? Annual physical exam 09/03/2021  ? Encounter for screening mammogram for malignant neoplasm of breast 09/03/2021  ? Stress incontinence in female 09/03/2021  ? Daily headache 09/03/2021  ? Snoring 09/03/2021  ? Situational stress 09/03/2021  ? Depression, recurrent ([place]) 09/03/2021  ? Chronic left-sided low back pain with left-sided sciatica 09/03/2021  ? Continuous tobacco abuse 09/03/2021  ? Acute cough 05/23/2021  ? Elevated blood pressure reading in office without diagnosis of hypertension 05/23/2021  ? BRCA negative 08/10/2018  ? ? ?Conditions to be addressed/monitored: Anxiety and Situational Stress; Mental Health Concerns  ? ?Care Plan : General Social Work (Adult)  ?Updates made by Vern Claude, LCSW since 09/18/2021 12:00 AM  ?  ? ?Problem: CHL AMB "PATIENT-SPECIFIC PROBLEM"   ?Note:   ?CARE PLAN ENTRY ?(see longitudinal plan of care for additional care plan information) ? ?Current Barriers:  ?Knowledge deficits related to accessing mental health provider in patient with Anxiety and Depression  ?Patient is experiencing symptoms of  anxiety and depression which seem to be exacerbated by recent separation form her spouse.  Patient also  "empty nesting"   ?Patient needs Support, Education, and Care Coordination in order to meet unmet mental health needs  ?Mental Health Concerns  ? ?Clinical Social Work Goal(s):  ?Over the next 90 days, patient will work with SW bi-weekly by telephone or in person to reduce or manage symptoms of anxiety and depression until  connected for ongoing counseling resources.  ?Patient will implement clinical interventions discussed today to decreases symptoms of anxiety and increase knowledge and/or ability of: self-management  skills. ? ?Interventions:  ?Assessed patient's understanding, education, previous treatment and care coordination needs  ?Patient interviewed and appropriate assessments performed: PHQ 2 ?PHQ 9 ?Provided basic mental health support, education and interventions  ?Collaborated with appropriate clinical care team members regarding patient needs ?Discussed options for long term counseling based on need and insurance. This Education officer, museum is agreeable to identifying in network providers for long term follow up ?Reviewed mental health medications with patient prescribed by PCP and discussed compliance  ?Other interventions include: PHQ2/ PHQ9 completed ?Active listening / Reflection utilized  ?Emotional Support Provided  ? ?Patient Self Care Activities & Deficits:  ?Patient is unable to independently navigate community resource options without care coordination support ?Patient is able to implement clinical interventions discussed today and is motivated for treatment  ?Patient will select one of the agencies from the list that will be provided and call to schedule an appointment  ?Performs ADL's independently ?Performs IADL's independently ?Ability for insight ?Motivation for treatment ? ?Initial goal documentation ? ?  ?  ? ?Follow Up Plan: SW will follow up with patient by phone over the next 14 business days ? ? ?Dacari Beckstrand, LCSW ?Clinical Social Worker  ?[facility] Management ?[phone] ? ?

## 2021-09-25 ENCOUNTER — Ambulatory Visit: Payer: 59 | Admitting: *Deleted

## 2021-09-25 DIAGNOSIS — F339 Major depressive disorder, recurrent, unspecified: Secondary | ICD-10-CM

## 2021-09-25 DIAGNOSIS — F439 Reaction to severe stress, unspecified: Secondary | ICD-10-CM

## 2021-09-25 NOTE — Chronic Care Management (AMB) (Signed)
Care Management ?Clinical Social Work Note ? ?09/25/2021 ?Name: [name] MRN: [id number] DOB: 01/30/1976 ? ?[name] is a 46 y.o. year old female who is a primary care patient of Doris Sprout, FNP.  The Care Management team was consulted for assistance with chronic disease management and coordination needs. ? ?Engaged with patient by telephone for follow up visit in response to provider referral for social work chronic care management and care coordination services ? ?Consent to Services:  ?[name] was given information about Care Management services today including:  ?Care Management services includes personalized support from designated clinical staff supervised by her physician, including individualized plan of care and coordination with other care providers ?24/7 contact phone numbers for assistance for urgent and routine care needs. ?The patient may stop case management services at any time by phone call to the office staff. ? ?Patient agreed to services and consent obtained.  ? ?Assessment: Review of patient past medical history, allergies, medications, and health status, including review of relevant consultants reports was performed today as part of a comprehensive evaluation and provision of chronic care management and care coordination services. ? ?SDOH (Social Determinants of Health) assessments and interventions performed:   ? ?Advanced Directives Status: Not addressed in this encounter. ? ?Care Plan ? ?Allergies  ?Allergen Reactions  ? Codeine Nausea And Vomiting  ? Sulfa Antibiotics Rash  ? Vicodin [Hydrocodone-Acetaminophen] Nausea Only  ?  And itching all over  ? ? ?Outpatient Encounter Medications as of 09/25/2021  ?Medication Sig  ? buPROPion (WELLBUTRIN XL) 150 MG 24 hr tablet Take 1 tablet (150 mg total) by mouth daily.  ? FLUoxetine (PROZAC) 40 MG capsule Take 1 capsule (40 mg total) by mouth daily. Need office visit for additional refills.  ? traZODone (DESYREL) 100 MG tablet TAKE  1 TABLET BY MOUTH AT BEDTIME  ? valACYclovir (VALTREX) 500 MG tablet Take 500 mg by mouth 3 (three) times daily.  ? ?No facility-administered encounter medications on file as of 09/25/2021.  ? ? ?Patient Active Problem List  ? Diagnosis Date Noted  ? Annual physical exam 09/03/2021  ? Encounter for screening mammogram for malignant neoplasm of breast 09/03/2021  ? Stress incontinence in female 09/03/2021  ? Daily headache 09/03/2021  ? Snoring 09/03/2021  ? Situational stress 09/03/2021  ? Depression, recurrent ([place]) 09/03/2021  ? Chronic left-sided low back pain with left-sided sciatica 09/03/2021  ? Continuous tobacco abuse 09/03/2021  ? Acute cough 05/23/2021  ? Elevated blood pressure reading in office without diagnosis of hypertension 05/23/2021  ? BRCA negative 08/10/2018  ? ? ?Conditions to be addressed/monitored:  ?Anxiety and Situational Stress; Mental Health Concerns  ?Care Plan : General Social Work (Adult)  ?Updates made by Vern Claude, LCSW since 09/25/2021 12:00 AM  ?  ? ?Problem: [facility] AMB "PATIENT-SPECIFIC PROBLEM"   ?Note:   ?CARE PLAN ENTRY ?(see longitudinal plan of care for additional care plan information) ? ?Current Barriers:  ?Knowledge deficits related to accessing mental health provider in patient with Anxiety and Situational Stress ?Patient is experiencing symptoms of  anxiety and depression which seem to be exacerbated by recent separation form her spouse.  Patient also  "empty nesting"   ?Patient needs Support, Education, and Care Coordination in order to meet unmet mental health needs  ?Mental Health Concerns  ? ?Clinical Social Work Goal(s):  ?Over the next 90 days, patient will work with SW bi-weekly by telephone or in person to reduce or manage symptoms of anxiety and stress  until connected for ongoing counseling resources.  ?Patient will implement clinical interventions discussed today to decreases symptoms of anxiety and increase knowledge and/or ability of: self-management  skills. ? ?Interventions:  ?Assessed patient's understanding, education, previous treatment and care coordination needs  ?Provided basic mental health support, education and interventions  ?Patient discussed difficulty tolerating new medication(Wellbutrin) stopped taking it due to increase in depressive symptoms ?Reviewed mental health medications with patient prescribed by PCP and discussed compliance  ?Patient agreeable to contacting her primary care doctor regarding medication side affects  ?Patient continues to struggle with depression and adjustments to her new family dynamic however reports that her aunt has moved in with her which has been helpful ?Current coping strategies explored, making plans and looking forward to future activities with auunt ?Discussed options for long term counseling based on need and insurance. In network list of local providers shared with patient who is agreeable to contacting them to schedule an appointment ?Other interventions include ?Active listening / Reflection utilized  ?Emotional Support Provided  ?Outpatient counseling encouraged ? ?Patient Self Care Activities & Deficits:  ?Patient is unable to independently navigate community resource options without care coordination support ?Patient is able to implement clinical interventions discussed today and is motivated for treatment  ?Patient will select one of the agencies from the list that will be provided and call to schedule an appointment  ?Performs ADL's independently ?Performs IADL's independently ?Ability for insight ?Motivation for treatment ? ?Please see past updates related to this goal by clicking on the "Past Updates" button in the selected goal  ? ?  ?  ? ?Follow Up Plan: SW will follow up with patient by phone over the next 30 business days ? ? ?Brittane Grudzinski, LCSW ?Clinical Social Worker  ?[facility] ?[phone] ? ?

## 2021-09-25 NOTE — Patient Instructions (Signed)
Visit Information ? ?Thank you for taking time to visit with me today. Please don't hesitate to contact me if I can be of assistance to you before our next scheduled telephone appointment. ? ?Following are the goals we discussed today:  ? ?- begin personal counseling ?- talk about feelings with a friend, family or spiritual advisor ?- practice positive thinking and self-talk  ?-please contact therapist of your choice to schedule initial appointment: ?[facility] [phone] ?[facility] [phone] ?[facility] [phone] ?[facility]  [phone] ? ?Our next appointment is by telephone on 10/01/21 at 1pm ? ?Please call the care guide team at [phone] if you need to cancel or reschedule your appointment.  ? ?If you are experiencing a Mental Health or Behavioral Health Crisis or need someone to talk to, please call the Suicide and Crisis Lifeline: 988  ? ?Patient verbalizes understanding of instructions and care plan provided today and agrees to view in MyChart. Active MyChart status confirmed with patient.   ? ?Telephone follow up appointment with care management team member scheduled for: 10/01/21 ? ? ?Machi Whittaker, LCSW ?Clinical Social Worker  ?[facility] ?[phone] ? ?

## 2021-10-01 ENCOUNTER — Ambulatory Visit: Payer: Self-pay | Admitting: Physical Therapy

## 2021-10-08 ENCOUNTER — Ambulatory Visit: Payer: Self-pay | Admitting: Physical Therapy

## 2021-10-15 ENCOUNTER — Encounter: Payer: Self-pay | Admitting: Physical Therapy

## 2021-10-22 ENCOUNTER — Encounter: Payer: Self-pay | Admitting: Physical Therapy

## 2021-10-24 ENCOUNTER — Ambulatory Visit: Payer: 59 | Admitting: Neurology

## 2021-10-24 ENCOUNTER — Encounter: Payer: Self-pay | Admitting: Neurology

## 2021-10-24 VITALS — BP 136/89 | HR 66 | Ht 66.0 in | Wt 167.6 lb

## 2021-10-24 DIAGNOSIS — R519 Headache, unspecified: Secondary | ICD-10-CM | POA: Diagnosis not present

## 2021-10-24 DIAGNOSIS — R351 Nocturia: Secondary | ICD-10-CM

## 2021-10-24 DIAGNOSIS — E663 Overweight: Secondary | ICD-10-CM

## 2021-10-24 DIAGNOSIS — R634 Abnormal weight loss: Secondary | ICD-10-CM

## 2021-10-24 DIAGNOSIS — G4719 Other hypersomnia: Secondary | ICD-10-CM

## 2021-10-24 DIAGNOSIS — R0683 Snoring: Secondary | ICD-10-CM | POA: Diagnosis not present

## 2021-10-24 DIAGNOSIS — Z820 Family history of epilepsy and other diseases of the nervous system: Secondary | ICD-10-CM

## 2021-10-24 NOTE — Patient Instructions (Signed)

## 2021-10-24 NOTE — Progress Notes (Signed)
Subjective:    Patient ID: [name] is a 46 y.o. female.  HPI    Star Age, MD, PhD [facility] [address].O. [place], [place] [address]  Dear Daneil Dan,   I saw your patient, [name], upon your kind request, in my Sleep clinic today for initial consultation of her sleep disorder, in particular, concern for underlying obstructive sleep apnea.  The patient is unaccompanied today.  As you know, [name] is a 46 year old right-handed woman with an underlying medical history of irritable bowel syndrome, history of hypoglycemia, recurrent UTI, and mildly overweight state, who reports snoring and excessive daytime somnolence as well as recurrent morning headaches.  I reviewed your office note from 09/03/2021.  Her Epworth sleepiness score is 11 out of 24, fatigue severity score is 45 out of 63. She is separated and lives with her BF, and her aunt lives with her as well.  Aunt has a service dog but patient does not have any pets.  She is a smoker and smokes about three quarters of a pack per day.  She is working on smoking cessation.  She drinks alcohol daily, at least 2 drinks per day and is working on alcohol reduction.  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  She goes to bed around 10 and rise time is around 6:30 AM or 7 AM.  She has been on trazodone for years, essentially since her late 44s and takes about 150 to 200 mg at night.  She has a history of bruxism but could not tolerate a dentist made occlusive guard.  She has nocturia once per average night and has frequent recurrent morning headaches, nearly every morning and takes ibuprofen, typically 400 mg every morning.  She has a family history of sleep apnea, both parents have CPAP machines.  She has been working on weight loss.  She has lost about 25+ pounds since December 2022.  She has caffeine in the form of soda, about 2-3 16/day on average and occasional unsweet tea.  She had a  laboratory sleep study some 20 years ago, at which time she did not have any sleep apnea as per her recollection.   Her Past Medical History Is Significant For: Past Medical History:  Diagnosis Date   BRCA negative 07/2018   MyRisk neg   Family history of breast cancer    IBIS=13%/riskscore=8.4%   Family history of ovarian cancer    Family history of pancreatic cancer    History of frequent urinary tract infections    Hypoglycemia    IBS (irritable bowel syndrome)     Her Past Surgical History Is Significant For: Past Surgical History:  Procedure Laterality Date   ABDOMINAL HYSTERECTOMY  07/2006   PARTIAL   BACK SURGERY     CHOLECYSTECTOMY     DIAGNOSTIC LAPAROSCOPY     test on bladder   GALLBLADDER SURGERY     LAPAROSCOPIC APPENDECTOMY N/A 12/08/2016   Procedure: APPENDECTOMY LAPAROSCOPIC;  Surgeon: Jules Husbands, MD;  Location: [facility] ORS;  Service: General;  Laterality: N/A;   MAXIMUM ACCESS (MAS)POSTERIOR LUMBAR INTERBODY FUSION (PLIF) 1 LEVEL N/A 05/06/2013   Procedure: LUMBAR FIVE-SACRAL ONE MAXIMUM ACCESS POSTERIOR LUMBAR INTERBODY FUSION;  Surgeon: Eustace Moore, MD;  Location: [facility];  Service: Neurosurgery;  Laterality: N/A;   TOE SURGERY     TUBAL LIGATION     WISDOM TOOTH EXTRACTION      Her Family History Is Significant  For: Family History  Problem Relation Age of Onset   Hypertension Mother    COPD Mother    Epilepsy Mother    Sleep apnea Mother    Hypertension Father    Sleep apnea Father    Breast cancer Maternal Aunt 5   Ovarian cancer Maternal Aunt 45   Colon cancer Paternal Uncle 57   Pancreatic cancer Maternal Grandmother 42   Colon cancer Maternal Grandfather 63    Her Social History Is Significant For: Social History   Socioeconomic History   Marital status: Legally Separated    Spouse name: Not on file   Number of children: Not on file   Years of education: Not on file   Highest education level: Not on file  Occupational History    Not on file  Tobacco Use   Smoking status: Every Day    Packs/day: 0.50    Years: 20.00    Pack years: 10.00    Types: Cigarettes    Last attempt to quit: 05/30/2018    Years since quitting: 3.4   Smokeless tobacco: Never  Vaping Use   Vaping Use: Never used  Substance and Sexual Activity   Alcohol use: Not Currently    Alcohol/week: 40.0 standard drinks    Types: 40 Cans of beer per week   Drug use: No   Sexual activity: Yes    Partners: Female, Female    Birth control/protection: Surgical    Comment: Hysterectomy  Other Topics Concern   Not on file  Social History Narrative   Not on file   Social Determinants of Health   Financial Resource Strain: High Risk   Difficulty of Paying Living Expenses: Hard  Food Insecurity: No Food Insecurity   Worried About Running Out of Food in the Last Year: Never true   Ran Out of Food in the Last Year: Never true  Transportation Needs: No Transportation Needs   Lack of Transportation (Medical): No   Lack of Transportation (Non-Medical): No  Physical Activity: Not on file  Stress: Not on file  Social Connections: Socially Isolated   Frequency of Communication with Friends and Family: More than three times a week   Frequency of Social Gatherings with Friends and Family: Three times a week   Attends Religious Services: Never   Active Member of Clubs or Organizations: No   Attends Archivist Meetings: Never   Marital Status: Separated    Her Allergies Are:  Allergies  Allergen Reactions   Codeine Nausea And Vomiting   Sulfa Antibiotics Rash   Vicodin [Hydrocodone-Acetaminophen] Nausea Only    And itching all over  :   Her Current Medications Are:  Outpatient Encounter Medications as of 10/24/2021  Medication Sig   Fexofenadine HCl (ALLEGRA PO) Take by mouth as needed.   FLUoxetine (PROZAC) 40 MG capsule Take 1 capsule (40 mg total) by mouth daily. Need office visit for additional refills.   Multiple  Vitamins-Minerals (WOMENS MULTIVITAMIN PO) Take by mouth daily at 2 PM.   traZODone (DESYREL) 100 MG tablet TAKE 1 TABLET BY MOUTH AT BEDTIME   valACYclovir (VALTREX) 500 MG tablet Take 500 mg by mouth as needed.   buPROPion (WELLBUTRIN XL) 150 MG 24 hr tablet Take 1 tablet (150 mg total) by mouth daily.   No facility-administered encounter medications on file as of 10/24/2021.  :   Review of Systems:  Out of a complete 14 point review of systems, all are reviewed and negative with the exception  of these symptoms as listed below:   Review of Systems  Neurological:        Pt is here for sleep consult  pt states snores,fatigue,headaches. Pt denies hypertension and CPAP at home . Pt states she had a sleep study done 15 years ago    ESS:11 FSS:45   Objective:  Neurological Exam  Physical Exam Physical Examination:   Vitals:   10/24/21 0916  BP: 136/89  Pulse: 66    General Examination: The patient is a very pleasant 46 y.o. female in no acute distress. She appears well-developed and well-nourished and well groomed.   HEENT: Normocephalic, atraumatic, pupils are equal, round and reactive to light, extraocular tracking is good without limitation to gaze excursion or nystagmus noted. Hearing is grossly intact. Face is symmetric with normal facial animation. Speech is clear with no dysarthria noted. There is no hypophonia. There is no lip, neck/head, jaw or voice tremor. Neck is supple with full range of passive and active motion. There are no carotid bruits on auscultation. Oropharynx exam reveals: mild mouth dryness, good dental hygiene and mild airway crowding, due to small airway, slightly wider tongue, Mallampati class I, tonsils small.  Neck circumference of 14-3/8 inches.  Minimal overbite.  Tongue protrudes centrally and palate elevates symmetrically.  Chest: Clear to auscultation without wheezing, rhonchi or crackles noted.  Heart: S1+S2+0, regular and normal without murmurs,  rubs or gallops noted.   Abdomen: Soft, non-tender and non-distended.  Extremities: There is no pitting edema in the distal lower extremities bilaterally.   Skin: Warm and dry without trophic changes noted.   Musculoskeletal: exam reveals no obvious joint deformities.   Neurologically:  Mental status: The patient is awake, alert and oriented in all 4 spheres. Her immediate and remote memory, attention, language skills and fund of knowledge are appropriate. There is no evidence of aphasia, agnosia, apraxia or anomia. Speech is clear with normal prosody and enunciation. Thought process is linear. Mood is normal and affect is normal.  Cranial nerves II - XII are as described above under HEENT exam.  Motor exam: Normal bulk, strength and tone is noted. There is no obvious tremor.   Fine motor skills and coordination: grossly intact.  Cerebellar testing: No dysmetria or intention tremor. There is no truncal or gait ataxia.  Sensory exam: intact to light touch in the upper and lower extremities.  Gait, station and balance: She stands easily. No veering to one side is noted. No leaning to one side is noted. Posture is age-appropriate and stance is narrow based. Gait shows normal stride length and normal pace. No problems turning are noted.   Assessment and Plan:  In summary, [name] is a very pleasant 46 y.o.-year old female with an underlying medical history of irritable bowel syndrome, history of hypoglycemia, recurrent UTI, and mildly overweight state, whose history and physical exam are concerning for obstructive sleep apnea (OSA). I had a long chat with the patient about my findings and the diagnosis of OSA, its prognosis and treatment options. We talked about medical treatments, surgical interventions and non-pharmacological approaches. I explained in particular the risks and ramifications of untreated moderate to severe OSA, especially with respect to developing cardiovascular disease  down the Road, including congestive heart failure, difficult to treat hypertension, cardiac arrhythmias, or stroke. Even type 2 diabetes has, in part, been linked to untreated OSA. Symptoms of untreated OSA include daytime sleepiness, memory problems, mood irritability and mood disorder such as depression and anxiety, lack of  energy, as well as recurrent headaches, especially morning headaches. We talked about smoking cessation and reduction of daily alcohol consumption and trying to maintain a healthy lifestyle in general, as well as the importance of weight control. We also talked about the importance of good sleep hygiene. I recommended the following at this time: sleep study.  I outlined the differences between a laboratory attended sleep study versus home sleep testing. I explained the sleep test procedure to the patient and also outlined possible surgical and non-surgical treatment options of OSA, including the use of a custom-made dental device (which would require a referral to a specialist dentist or oral surgeon), upper airway surgical options, such as traditional UPPP or a novel less invasive surgical option in the form of Inspire hypoglossal nerve stimulation (which would involve a referral to an ENT surgeon). I also explained the CPAP treatment option to the patient, who indicated that she would be willing to try PAP therapy, if the need arises.  We will pick up our discussion after sleep testing.  We will call her with her test results and plan a follow-up in sleep clinic accordingly.  I answered all her questions today and she was in agreement.   Thank you very much for allowing me to participate in the care of this nice patient. If I can be of any further assistance to you please do not hesitate to call me at [phone].  Sincerely,   Star Age, MD, PhD

## 2021-11-01 ENCOUNTER — Telehealth: Payer: Self-pay | Admitting: Neurology

## 2021-11-01 NOTE — Telephone Encounter (Signed)
HST- [employer] no auth req, patient is scheduled to pick up on 11/12/21 at 9:30 AM.

## 2021-11-08 ENCOUNTER — Ambulatory Visit: Payer: 59 | Admitting: Family Medicine

## 2021-11-12 ENCOUNTER — Ambulatory Visit: Payer: 59 | Admitting: Neurology

## 2021-11-12 DIAGNOSIS — R0683 Snoring: Secondary | ICD-10-CM

## 2021-11-12 DIAGNOSIS — R634 Abnormal weight loss: Secondary | ICD-10-CM

## 2021-11-12 DIAGNOSIS — E663 Overweight: Secondary | ICD-10-CM

## 2021-11-12 DIAGNOSIS — R519 Headache, unspecified: Secondary | ICD-10-CM

## 2021-11-12 DIAGNOSIS — Z820 Family history of epilepsy and other diseases of the nervous system: Secondary | ICD-10-CM

## 2021-11-12 DIAGNOSIS — G4710 Hypersomnia, unspecified: Secondary | ICD-10-CM

## 2021-11-12 DIAGNOSIS — R351 Nocturia: Secondary | ICD-10-CM

## 2021-11-12 DIAGNOSIS — G4719 Other hypersomnia: Secondary | ICD-10-CM

## 2021-11-14 NOTE — Progress Notes (Signed)
See procedure note.

## 2021-11-15 NOTE — Procedures (Signed)
   [facility]  HOME SLEEP TEST (Watch PAT) REPORT  STUDY DATE: 11/12/2021  DOB: 19-Feb-1976  MRN: [id number]  ORDERING CLINICIAN: Huston Foley, MD, PhD   REFERRING CLINICIAN: [name], FNP   CLINICAL INFORMATION/HISTORY: 46 year old right-handed woman with an underlying medical history of irritable bowel syndrome, history of hypoglycemia, recurrent UTI, and mildly overweight state, who reports snoring and excessive daytime somnolence as well as recurrent morning headaches.  Epworth sleepiness score: 11/24.  BMI: 26.9 kg/m  FINDINGS:   Sleep Summary:   Total Recording Time (hours, min): 8 hours, 24 minutes  Total Sleep Time (hours, min):  7 hours, 31 minutes   Percent REM (%):    37.8%   Respiratory Indices:   Calculated pAHI (per hour):  3.3/hour         REM pAHI:    7.8/hour       NREM pAHI: 0.6/hour  Oxygen Saturation Statistics:    Oxygen Saturation (%) Mean: 94%   Minimum oxygen saturation (%):                 90%   O2 Saturation Range (%): 90-98%    O2 Saturation (minutes) <=88%: 0 min  Pulse Rate Statistics:   Pulse Mean (bpm):    62/min    Pulse Range (50-104/min)   IMPRESSION: Primary snoring   RECOMMENDATION:  This home sleep test does not demonstrate any significant obstructive or central sleep disordered breathing. Snoring was noted, and appeared to be in the mild to moderate range. For disturbing snoring, an oral appliance (through a dentist or orthodontist) can be considered. Other causes of the patient's symptoms, including circadian rhythm disturbances, an underlying mood disorder, medication effect and/or an underlying medical problem cannot be ruled out based on this test. Clinical correlation is recommended. The patient should be cautioned not to drive, work at heights, or operate dangerous or heavy equipment when tired or sleepy. Review and reiteration of good sleep hygiene measures should be pursued with any  patient. The patient can follow up with her referring provider, who will be notified of the test results.   I certify that I have reviewed the raw data recording prior to the issuance of this report in accordance with the standards of the American Academy of Sleep Medicine (AASM).   INTERPRETING PHYSICIAN:   Huston Foley, MD, PhD  Board Certified in Neurology and Sleep Medicine  [facility] [address], [address] [place], [place] [address] [phone]

## 2021-11-19 ENCOUNTER — Telehealth: Payer: Self-pay | Admitting: *Deleted

## 2021-11-19 NOTE — Telephone Encounter (Signed)
-----   Message from Huston Foley, MD sent at 11/15/2021  5:35 PM EDT ----- Patient referred by PCP, seen by me on 10/24/2021, HST on 11/12/2021.   Please call and notify the patient that the recent home sleep test did not show any significant obstructive sleep apnea.  She had evidence of mild to moderate snoring.  For disturbing snoring, an oral appliance (through a qualified dentist) can be considered.  We can facilitate with a referral to dentistry but an oral appliance may or may not be covered by insurance for just snoring.  Let me know if she would like a referral.  She is advised to follow-up with her referring provider at this point. Thanks,  Huston Foley, MD, PhD [facility])

## 2021-11-19 NOTE — Telephone Encounter (Signed)
Noted, thank you

## 2021-11-19 NOTE — Telephone Encounter (Addendum)
Spoke to patient gave her sleep study results . Pt declined to move forward with oral appliance . Pt states  will follow up with PCP . Pt thanked me for calling . Will let Dr Frances Furbish aware of patients decision about oral appliance

## 2021-12-01 ENCOUNTER — Other Ambulatory Visit: Payer: Self-pay | Admitting: Family Medicine

## 2021-12-01 DIAGNOSIS — G4700 Insomnia, unspecified: Secondary | ICD-10-CM

## 2021-12-01 DIAGNOSIS — F439 Reaction to severe stress, unspecified: Secondary | ICD-10-CM

## 2021-12-03 NOTE — Telephone Encounter (Signed)
Requested Prescriptions  Pending Prescriptions Disp Refills  . FLUoxetine (PROZAC) 40 MG capsule [Pharmacy Med Name: FLUoxetine HCl 40 MG Oral Capsule] 90 capsule 0    Sig: TAKE 1 CAPSULE BY MOUTH ONCE DAILY . APPOINTMENT REQUIRED FOR FUTURE REFILLS     Psychiatry:  Antidepressants - SSRI Passed - 12/01/2021  8:14 AM      Passed - Completed PHQ-2 or PHQ-9 in the last 360 days      Passed - Valid encounter within last 6 months    Recent Outpatient Visits          3 months ago Annual physical exam   [facility] Merita Norton T, FNP   6 months ago Acute non-recurrent pansinusitis   [facility] [name], FNP   9 months ago Cellulitis of face   [facility] Maple Hudson., MD   1 year ago Breast pain   [facility] Osvaldo Angst M, [place]   1 year ago Depression, unspecified depression type   [facility] [place], Adriana M, [place]             . traZODone (DESYREL) 100 MG tablet [Pharmacy Med Name: traZODone HCl 100 MG Oral Tablet] 90 tablet 0    Sig: TAKE 1 TABLET BY MOUTH AT BEDTIME     Psychiatry: Antidepressants - Serotonin Modulator Passed - 12/01/2021  8:14 AM      Passed - Completed PHQ-2 or PHQ-9 in the last 360 days      Passed - Valid encounter within last 6 months    Recent Outpatient Visits          3 months ago Annual physical exam   [facility] Mer[facility] T, FNP   6 months ago Acute non-recurrent pansinusitis   [facility] [name], FNP   9 months ago Cellulitis of face   [facility] Maple Hudson., MD   1 year ago Breast pain   [facility] Osvaldo Angst M, [place]   1 year ago Depression, unspecified depression type   [facility] [place], [place], [place]

## 2021-12-05 DIAGNOSIS — M533 Sacrococcygeal disorders, not elsewhere classified: Secondary | ICD-10-CM | POA: Insufficient documentation

## 2021-12-07 ENCOUNTER — Other Ambulatory Visit: Payer: Self-pay | Admitting: Family Medicine

## 2021-12-07 DIAGNOSIS — G4700 Insomnia, unspecified: Secondary | ICD-10-CM

## 2021-12-07 NOTE — Telephone Encounter (Signed)
Refilled 08/24/2021 #90 1 refill - receipt confirmed by pharmacy. Requested Prescriptions  Pending Prescriptions Disp Refills  . traZODone (DESYREL) 100 MG tablet [Pharmacy Med Name: traZODone HCl 100 MG Oral Tablet] 90 tablet 0    Sig: TAKE 1 TABLET BY MOUTH AT BEDTIME     Psychiatry: Antidepressants - Serotonin Modulator Passed - 12/07/2021 12:00 PM      Passed - Completed PHQ-2 or PHQ-9 in the last 360 days      Passed - Valid encounter within last 6 months    Recent Outpatient Visits          3 months ago Annual physical exam   [facility] Merita Norton T, FNP   6 months ago Acute non-recurrent pansinusitis   [facility] [name], FNP   9 months ago Cellulitis of face   [facility] Maple Hudson., MD   1 year ago Breast pain    [facility] Osvaldo Angst M, [place]   1 year ago Depression, unspecified depression type   [facility] [place], [place], [place]

## 2021-12-10 ENCOUNTER — Emergency Department: Payer: 59

## 2021-12-10 ENCOUNTER — Emergency Department
Admission: EM | Admit: 2021-12-10 | Discharge: 2021-12-10 | Disposition: A | Discharge status: Home / Self Care | Payer: 59 | Attending: Student in an Organized Health Care Education/Training Program | Admitting: Student in an Organized Health Care Education/Training Program

## 2021-12-10 ENCOUNTER — Ambulatory Visit: Payer: Self-pay | Admitting: *Deleted

## 2021-12-10 ENCOUNTER — Other Ambulatory Visit: Payer: Self-pay

## 2021-12-10 DIAGNOSIS — R1013 Epigastric pain: Secondary | ICD-10-CM | POA: Diagnosis not present

## 2021-12-10 DIAGNOSIS — R5381 Other malaise: Secondary | ICD-10-CM | POA: Insufficient documentation

## 2021-12-10 DIAGNOSIS — R112 Nausea with vomiting, unspecified: Secondary | ICD-10-CM | POA: Insufficient documentation

## 2021-12-10 DIAGNOSIS — R300 Dysuria: Secondary | ICD-10-CM | POA: Insufficient documentation

## 2021-12-10 DIAGNOSIS — R1010 Upper abdominal pain, unspecified: Secondary | ICD-10-CM

## 2021-12-10 DIAGNOSIS — R5383 Other fatigue: Secondary | ICD-10-CM | POA: Insufficient documentation

## 2021-12-10 DIAGNOSIS — F17200 Nicotine dependence, unspecified, uncomplicated: Secondary | ICD-10-CM | POA: Diagnosis not present

## 2021-12-10 DIAGNOSIS — R6883 Chills (without fever): Secondary | ICD-10-CM | POA: Diagnosis not present

## 2021-12-10 LAB — URINALYSIS, ROUTINE W REFLEX MICROSCOPIC
Bilirubin Urine: NEGATIVE
Glucose, UA: NEGATIVE mg/dL
Ketones, ur: 20 mg/dL — AB
Leukocytes,Ua: NEGATIVE
Nitrite: NEGATIVE
Protein, ur: 100 mg/dL — AB
Specific Gravity, Urine: 1.018 (ref 1.005–1.030)
pH: 5 (ref 5.0–8.0)

## 2021-12-10 LAB — CBC
HCT: 45.5 % (ref 36.0–46.0)
Hemoglobin: 15.4 g/dL — ABNORMAL HIGH (ref 12.0–15.0)
MCH: 32.3 pg (ref 26.0–34.0)
MCHC: 33.8 g/dL (ref 30.0–36.0)
MCV: 95.4 fL (ref 80.0–100.0)
Platelets: 350 10*3/uL (ref 150–400)
RBC: 4.77 MIL/uL (ref 3.87–5.11)
RDW: 11.9 % (ref 11.5–15.5)
WBC: 8.3 10*3/uL (ref 4.0–10.5)
nRBC: 0 % (ref 0.0–0.2)

## 2021-12-10 LAB — COMPREHENSIVE METABOLIC PANEL
ALT: 33 U/L (ref 0–44)
AST: 44 U/L — ABNORMAL HIGH (ref 15–41)
Albumin: 4.6 g/dL (ref 3.5–5.0)
Alkaline Phosphatase: 64 U/L (ref 38–126)
Anion gap: 14 (ref 5–15)
BUN: 10 mg/dL (ref 6–20)
CO2: 21 mmol/L — ABNORMAL LOW (ref 22–32)
Calcium: 9.4 mg/dL (ref 8.9–10.3)
Chloride: 101 mmol/L (ref 98–111)
Creatinine, Ser: 0.82 mg/dL (ref 0.44–1.00)
GFR, Estimated: >60 mL/min (ref >60)
Glucose, Bld: 104 mg/dL — ABNORMAL HIGH (ref 70–99)
Potassium: 4.3 mmol/L (ref 3.5–5.1)
Sodium: 136 mmol/L (ref 135–145)
Total Bilirubin: 1 mg/dL (ref 0.3–1.2)
Total Protein: 7.9 g/dL (ref 6.5–8.1)

## 2021-12-10 LAB — LIPASE, BLOOD: Lipase: 29 U/L (ref 11–51)

## 2021-12-10 MED ORDER — IOHEXOL 300 MG/ML  SOLN
100.0000 mL | Freq: Once | INTRAMUSCULAR | Status: AC | PRN
Start: 2021-12-10 — End: 2021-12-10
  Administered 2021-12-10: 100 mL via INTRAVENOUS

## 2021-12-10 MED ORDER — ALUM & MAG HYDROXIDE-SIMETH 200-200-20 MG/5ML PO SUSP
30.0000 mL | Freq: Once | ORAL | Status: AC
  Administered 2021-12-10: 30 mL via ORAL
  Filled 2021-12-10: qty 30

## 2021-12-10 MED ORDER — FAMOTIDINE IN NACL 20-0.9 MG/50ML-% IV SOLN
20.0000 mg | Freq: Once | INTRAVENOUS | Status: AC
  Administered 2021-12-10: 20 mg via INTRAVENOUS
  Filled 2021-12-10: qty 50

## 2021-12-10 NOTE — ED Triage Notes (Signed)
Pt c/o chills with sweats and N/V since last night with upper abd pain. Pt is in NAD on arrival

## 2021-12-10 NOTE — Telephone Encounter (Signed)
Summary: cold sweats - wants same day appointment.   Patient called to get an appointment for today (no same day, no OV available with any provider at [facility] when I checked for 7/10).  States that she has had cold sweats all night - changed sheets twice.  States that she is having dry mouth and is freezing.  Patient would like to know if she should go to Urgent Care of if any other visit could be provided to her today.        Chief Complaint: cold sweats Symptoms: cold sweats all over changed sheets last night x 2, palms of hands drenched with sweat. Nausea, dizziness, cough, upper abdominal discomfort and tender to touch, has not checked temperature. Reports "freezing" has not tested for covid or flu Frequency: 1 day  Pertinent Negatives: Patient denies chest pain or difficulty breathing  Disposition: [x] ED /[] Urgent Care (no appt availability in office) / [] Appointment(In office/virtual)/ []  [facility] [facility]/ [] Home Care/ [] Refused Recommended Disposition /[]  Mobile Bus/ []  Follow-up with PCP Additional Notes:   Recommended ED possible signs dehydration.  FC called to report recommended pt to ED. No answer.   Reason for Disposition  Patient sounds very sick or weak to the triager  Answer Assessment - Initial Assessment Questions 1. ONSET: "When did the sweating start?"      1 day ago  2. LOCATION: "What part of your body has excessive sweating?" (e.g., entire body; just face, underarms, palms, or soles of feet).      Whole body and palms of hands  3. SEVERITY: "How bad is the sweating?"    (Scale 1-10; or mild, moderate, severe)   -  MILD (1-3): doesn't interfere with normal activities    -  MODERATE (4-7): interferes with normal activities (e.g., work or school) or awakens from sleep; causes embarrassment in social situations    -  SEVERE (8-10): drenching sweats and has to change bed clothes or bed linens.     severe 4. CAUSE: "What do you think is causing the  sweating?"     Not sure  5. FEVER: "Have you been having fevers?"     Not sure  6. OTHER SYMPTOMS: "Do you have any other symptoms?" (e.g., chest pain, difficulty breathing, lightheadedness, weight loss)     Dizziness, nausea, dry mouth , cold sweats and "freezing'. Palms of hands sweating. Urinated x 1 within 12 hours.  Protocols used: [facility]

## 2021-12-10 NOTE — Discharge Instructions (Addendum)
Take 40 mg of Pepcid once daily.  

## 2021-12-10 NOTE — ED Provider Notes (Signed)
[facility] Provider Note  Patient Contact: 5:24 PM (approximate)   History   Abdominal Pain   HPI  [name] is a 46 y.o. female with a history of appendectomy, cholecystectomy, hysterectomy and irritable bowel syndrome, presents to the emergency department with 2 to 3 days of fatigue, epigastric abdominal discomfort, nausea, vomiting and some chills at home.  Patient denies sick contacts at home with similar symptoms.  She states that she had some slight dysuria last night but no hematuria and no increased urinary frequency.  She denies associated diarrhea, rhinorrhea, nasal congestion or nonproductive cough.  No pharyngitis.  Patient states that she does take occasional NSAIDs and also drinks alcohol daily.  She is a daily smoker.  She denies a prior history of gastritis or prior GI bleed.  She states that she took Pepto last night for symptoms but has not attempted any other alleviating measures and does not take any daily antacid medications.      Physical Exam   Triage Vital Signs: ED Triage Vitals  Enc Vitals Group     BP 12/10/21 1229 (!) 142/102     Pulse Rate 12/10/21 1227 91     Resp 12/10/21 1227 16     Temp 12/10/21 1227 98.7 F (37.1 C)     Temp Source 12/10/21 1227 Oral     SpO2 12/10/21 1227 97 %     Weight 12/10/21 1227 165 lb (74.8 kg)     Height 12/10/21 1227 5\' 6"  (1.676 m)     Head Circumference --      Peak Flow --      Pain Score 12/10/21 1227 7     Pain Loc --      Pain Edu? --      Excl. in GC? --     Most recent vital signs: Vitals:   12/10/21 1704 12/10/21 1849  BP: (!) 140/99 130/90  Pulse: 88 80  Resp: 16 16  Temp:    SpO2: 98% 98%     General: Alert and in no acute distress. Eyes:  PERRL. EOMI. Head: No acute traumatic findings ENT:      Nose: No congestion/rhinnorhea.      Mouth/Throat: Mucous membranes are moist.  Neck: No stridor. No cervical spine tenderness to  palpation. Hematological/Lymphatic/Immunilogical: No cervical lymphadenopathy. Cardiovascular:  Good peripheral perfusion Respiratory: Normal respiratory effort without tachypnea or retractions. Lungs CTAB. Good air entry to the bases with no decreased or absent breath sounds. Gastrointestinal: Bowel sounds 4 quadrants. Soft and nontender to palpation. No guarding or rigidity. No palpable masses. No distention. No CVA tenderness. Musculoskeletal: Full range of motion to all extremities.  Neurologic:  No gross focal neurologic deficits are appreciated.  Skin:   No rash noted Other:   ED Results / Procedures / Treatments   Labs (all labs ordered are listed, but only abnormal results are displayed) Labs Reviewed  COMPREHENSIVE METABOLIC PANEL - Abnormal; Notable for the following components:      Result Value   CO2 21 (*)    Glucose, Bld 104 (*)    AST 44 (*)    All other components within normal limits  CBC - Abnormal; Notable for the following components:   Hemoglobin 15.4 (*)    All other components within normal limits  URINALYSIS, ROUTINE W REFLEX MICROSCOPIC - Abnormal; Notable for the following components:   Color, Urine YELLOW (*)    APPearance HAZY (*)    Hgb urine dipstick  SMALL (*)    Ketones, ur 20 (*)    Protein, ur 100 (*)    Bacteria, UA FEW (*)    All other components within normal limits  LIPASE, BLOOD        RADIOLOGY  I personally viewed and evaluated these images as part of my medical decision making, as well as reviewing the written report by the radiologist.  ED Provider Interpretation: CT abdomen pelvis shows no acute abnormality.   PROCEDURES:  Critical Care performed: No  Procedures   MEDICATIONS ORDERED IN ED: Medications  famotidine (PEPCID) IVPB 20 mg premix (20 mg Intravenous New Bag/Given 12/10/21 1916)  iohexol (OMNIPAQUE) 300 MG/ML solution 100 mL (100 mLs Intravenous Contrast Given 12/10/21 1810)  alum & mag hydroxide-simeth  (MAALOX/MYLANTA) 200-200-20 MG/5ML suspension 30 mL (30 mLs Oral Given 12/10/21 1913)     IMPRESSION / MDM / ASSESSMENT AND PLAN / ED COURSE  I reviewed the triage vital signs and the nursing notes.                              Assessment and plan Abdominal pain:  Differential diagnosis includes gastroenteritis, gastritis, nephrolithiasis, UTI, SBO...  46 year old female presents to the emergency department with nausea, epigastric abdominal pain and generalized malaise at home for the past 2 to 3 days.  CBC, CMP and lipase within the parameters of normal.  Urinalysis not overly suggestive of UTI.  Will obtain CT abdomen pelvis with IV Pepcid administered and will reassess.  CT abdomen pelvis unremarkable.  Patient's pain improved with IV Pepcid and GI cocktail.  Will recommend 40 mg of Pepcid once daily and GI follow-up.   FINAL CLINICAL IMPRESSION(S) / ED DIAGNOSES   Final diagnoses:  Pain of upper abdomen     Rx / DC Orders   ED Discharge Orders     None        Note:  This document was prepared using Dragon voice recognition software and may include unintentional dictation errors.   Pia Mau Ransom, PA-C 12/10/21 1943    Willy Eddy, MD 12/10/21 2013

## 2022-01-03 ENCOUNTER — Other Ambulatory Visit: Payer: Self-pay | Admitting: Family Medicine

## 2022-01-03 DIAGNOSIS — G4700 Insomnia, unspecified: Secondary | ICD-10-CM

## 2022-03-15 ENCOUNTER — Ambulatory Visit: Payer: 59 | Admitting: Physician Assistant

## 2022-03-15 ENCOUNTER — Encounter: Payer: Self-pay | Admitting: Physician Assistant

## 2022-03-15 VITALS — BP 142/85 | HR 73 | Temp 98.8°F | Wt 179.0 lb

## 2022-03-15 DIAGNOSIS — R21 Rash and other nonspecific skin eruption: Secondary | ICD-10-CM

## 2022-03-15 DIAGNOSIS — Z202 Contact with and (suspected) exposure to infections with a predominantly sexual mode of transmission: Secondary | ICD-10-CM | POA: Diagnosis not present

## 2022-03-15 DIAGNOSIS — L299 Pruritus, unspecified: Secondary | ICD-10-CM | POA: Diagnosis not present

## 2022-03-15 DIAGNOSIS — Z23 Encounter for immunization: Secondary | ICD-10-CM

## 2022-03-15 MED ORDER — HYDROXYZINE PAMOATE 25 MG PO CAPS: 25.0000 mg | ORAL_CAPSULE | Freq: Three times a day (TID) | ORAL | 0 refills | Status: DC | PRN

## 2022-03-15 NOTE — Progress Notes (Unsigned)
[place]ina Ponder [place],acting as a Education administrator for [employer], PA-C.,have documented all relevant documentation on the behalf of Mardene Speak, PA-C,as directed by  [employer], PA-C while in the presence of [employer], PA-C.     Established patient visit   Patient: [name]   DOB: 03-23-1976   46 y.o. Female  MRN: [id number] Visit Date: 03/15/2022  Today's healthcare provider: Mardene Speak, PA-C  CC: possible STD  Subjective    HPI  Patient is a 46 year old female who presents for evaluation of possible STD.  Patient states that she has been in a relationship for the last 8 months.  She has recently noticed 2 vaginal bumps that have some itching but also causing vaginal pain.  She is post hysterectomy.   She has just learned that her new boyfriend had been treated about 1 year ago for [facility] that was found by an orthopedist.  He had been having trouble with his knee and when seen they determined the cause to be GC.  She states he was treated for it but does not know if he was re-tested post treatment and does not think he had been tested or treated for other STD.  She reports noticing that he had some small bumps around his penis.   Patient also complains of itching all over but especially under her right breast.  She notes that there is an area of fine, red, raised bumps in the right rib area along with a couple smaller patches of larger bumps.   She denies any vaginal discharge or fever.    Medications: Outpatient Medications Prior to Visit  Medication Sig   Fexofenadine HCl (ALLEGRA PO) Take by mouth as needed.   FLUoxetine (PROZAC) 40 MG capsule TAKE 1 CAPSULE BY MOUTH ONCE DAILY . APPOINTMENT REQUIRED FOR FUTURE REFILLS   traZODone (DESYREL) 100 MG tablet Take 1 tablet (100 mg total) by mouth at bedtime. Please schedule office visit before any future refill.   valACYclovir (VALTREX) 500 MG tablet Take 500 mg by mouth as needed.   buPROPion (WELLBUTRIN XL) 150 MG 24 hr tablet Take  1 tablet (150 mg total) by mouth daily.   Multiple Vitamins-Minerals (WOMENS MULTIVITAMIN PO) Take by mouth daily at 2 PM.   No facility-administered medications prior to visit.    Review of Systems  All other systems reviewed and are negative. Except see HPI     Objective    BP (!) 142/85 (BP Location: Right Arm, Patient Position: Sitting, Cuff Size: Normal)   Pulse 73   Temp 98.8 F (37.1 C) (Oral)   Wt 179 lb (81.2 kg)   SpO2 99%   BMI 28.89 kg/m    Physical Exam Vitals reviewed.  Constitutional:      General: She is not in acute distress.    Appearance: She is well-developed.  HENT:     Head: Normocephalic and atraumatic.  Eyes:     General: No scleral icterus.    Conjunctiva/sclera: Conjunctivae normal.  Cardiovascular:     Rate and Rhythm: Normal rate and regular rhythm.     Heart sounds: Normal heart sounds. No murmur heard. Pulmonary:     Effort: Pulmonary effort is normal. No respiratory distress.     Breath sounds: Normal breath sounds. No wheezing or rales.  Abdominal:     General: There is no distension.     Palpations: Abdomen is soft.     Tenderness: There is abdominal tenderness in the suprapubic area. There  is no guarding or rebound.  Skin:    General: Skin is warm and dry.     Capillary Refill: Capillary refill takes less than 2 seconds.     Findings: Rash (right abdomen) present.  Neurological:     Mental Status: She is alert and oriented to person, place, and time.  Psychiatric:        Behavior: Behavior normal.      No results found for any visits on 03/15/22.  Assessment & Plan     Possible exposure to STD  - RPR - HIV antibody (with reflex) - NuSwab Vaginitis Plus (VG+)   Rash Unclear etiology Trial of topicals steroids, antifungals/right abdomen and valtrex/labia majora - hydrOXYzine (VISTARIL) 25 MG capsule; Take 1 capsule (25 mg total) by mouth every 8 (eight) hours as needed.  Dispense: 30 capsule; Refill: 0 - Comprehensive  metabolic panel   FU PRN   The patient was advised to call back or seek an in-person evaluation if the symptoms worsen or if the condition fails to improve as anticipated.  I discussed the assessment and treatment plan with the patient. The patient was provided an opportunity to ask questions and all were answered. The patient agreed with the plan and demonstrated an understanding of the instructions.  The entirety of the information documented in the History of Present Illness, Review of Systems and Physical Exam were personally obtained by me. Portions of this information were initially documented by the CMA and reviewed by me for thoroughness and accuracy.  Portions of this note were created using dictation software and may contain typographical errors.   Mardene Speak, PA-C  [facility] [phone] (phone) [phone] (fax)  [place]

## 2022-03-16 LAB — COMPREHENSIVE METABOLIC PANEL
ALT: 19 IU/L (ref 0–32)
AST: 22 IU/L (ref 0–40)
Albumin/Globulin Ratio: 1.9 (ref 1.2–2.2)
Albumin: 4.7 g/dL (ref 3.9–4.9)
Alkaline Phosphatase: 70 IU/L (ref 44–121)
BUN/Creatinine Ratio: 10 (ref 9–23)
BUN: 9 mg/dL (ref 6–24)
Bilirubin Total: <0.2 mg/dL (ref 0.0–1.2)
CO2: 22 mmol/L (ref 20–29)
Calcium: 9.6 mg/dL (ref 8.7–10.2)
Chloride: 100 mmol/L (ref 96–106)
Creatinine, Ser: 0.86 mg/dL (ref 0.57–1.00)
Globulin, Total: 2.5 g/dL (ref 1.5–4.5)
Glucose: 85 mg/dL (ref 70–99)
Potassium: 5 mmol/L (ref 3.5–5.2)
Sodium: 136 mmol/L (ref 134–144)
Total Protein: 7.2 g/dL (ref 6.0–8.5)
eGFR: 85 mL/min/{1.73_m2} (ref >59)

## 2022-03-16 LAB — RPR: RPR Ser Ql: NONREACTIVE

## 2022-03-16 LAB — HIV ANTIBODY (ROUTINE TESTING W REFLEX): HIV Screen 4th Generation wRfx: NONREACTIVE

## 2022-03-18 ENCOUNTER — Other Ambulatory Visit: Payer: Self-pay | Admitting: Family Medicine

## 2022-03-18 MED ORDER — VALACYCLOVIR HCL 500 MG PO TABS: 1000.0000 mg | ORAL_TABLET | Freq: Two times a day (BID) | ORAL | 11 refills | Status: AC

## 2022-03-19 ENCOUNTER — Other Ambulatory Visit: Payer: Self-pay | Admitting: Physician Assistant

## 2022-03-19 DIAGNOSIS — B9689 Other specified bacterial agents as the cause of diseases classified elsewhere: Secondary | ICD-10-CM

## 2022-03-19 LAB — NUSWAB VAGINITIS PLUS (VG+)
Atopobium vaginae: HIGH Score — AB
BVAB 2: HIGH Score — AB
Candida albicans, NAA: NEGATIVE
Candida glabrata, NAA: NEGATIVE
Chlamydia trachomatis, NAA: NEGATIVE
Megasphaera 1: HIGH Score — AB
Neisseria gonorrhoeae, NAA: NEGATIVE
Trich vag by NAA: NEGATIVE

## 2022-03-19 LAB — SPECIMEN STATUS REPORT

## 2022-03-19 MED ORDER — METRONIDAZOLE 500 MG PO TABS: 500.0000 mg | ORAL_TABLET | Freq: Two times a day (BID) | ORAL | 0 refills | Status: AC

## 2022-03-19 NOTE — Progress Notes (Unsigned)
Metronidazole 500 mg orally BID for 7 days Advise patients to avoid alcohol during treatment with oral metronidazole and for 24 hours following. 

## 2022-03-19 NOTE — Progress Notes (Signed)
Hello [name] ,   Your labwork results all are within normal limits Except you are positive for bacterial vaginosis. that happens when some normal bacteria that live in your vagina overgrow, causing a bacterial imbalance.  A medication will be sent to your pharmacy  No changes need to be made to medications, and no further tests need to be ordered.  Any questions please reach out to the office or message me on MyChart!  Best,  Mardene Speak, PA-C

## 2022-03-30 ENCOUNTER — Other Ambulatory Visit: Payer: Self-pay | Admitting: Family Medicine

## 2022-03-30 DIAGNOSIS — G4700 Insomnia, unspecified: Secondary | ICD-10-CM

## 2022-04-01 NOTE — Telephone Encounter (Signed)
Requested Prescriptions  Pending Prescriptions Disp Refills  . traZODone (DESYREL) 100 MG tablet [Pharmacy Med Name: traZODone HCl 100 MG Oral Tablet] 90 tablet 0    Sig: TAKE 1 TABLET BY MOUTH AT BEDTIME . APPOINTMENT REQUIRED FOR FUTURE REFILLS     Psychiatry: Antidepressants - Serotonin Modulator Passed - 03/30/2022 12:20 AM      Passed - Completed PHQ-2 or PHQ-9 in the last 360 days      Passed - Valid encounter within last 6 months    Recent Outpatient Visits          2 weeks ago Possible exposure to STD   [facility] [place], [employer], PA-C   7 months ago Annual physical exam   [facility] Tally Joe T, FNP   10 months ago Acute non-recurrent pansinusitis   [facility] Gwyneth Sprout, FNP   1 year ago Cellulitis of face   [facility] Jerrol Banana., MD   1 year ago Breast pain   [facility] Carles [place]let [place], [place]

## 2022-04-02 ENCOUNTER — Encounter: Payer: Self-pay | Admitting: Family Medicine

## 2022-04-03 ENCOUNTER — Other Ambulatory Visit: Payer: Self-pay | Admitting: Family Medicine

## 2022-04-03 MED ORDER — TRAZODONE HCL 100 MG PO TABS: ORAL_TABLET | ORAL | 3 refills | Status: DC

## 2022-04-18 ENCOUNTER — Ambulatory Visit
Admission: RE | Admit: 2022-04-18 | Discharge: 2022-04-18 | Disposition: A | Discharge status: Home / Self Care | Payer: 59 | Source: Ambulatory Visit | Attending: Family Medicine | Admitting: Family Medicine

## 2022-04-18 DIAGNOSIS — Z1231 Encounter for screening mammogram for malignant neoplasm of breast: Secondary | ICD-10-CM | POA: Insufficient documentation

## 2022-04-22 NOTE — Progress Notes (Signed)
Hi Braiden  Normal mammogram; repeat in 1 year.  Please let us know if you have any questions.  Thank you,  Merita Norton, FNP

## 2022-04-25 ENCOUNTER — Other Ambulatory Visit: Payer: Self-pay | Admitting: Physician Assistant

## 2022-04-25 DIAGNOSIS — R21 Rash and other nonspecific skin eruption: Secondary | ICD-10-CM

## 2022-04-26 NOTE — Telephone Encounter (Signed)
Requested Prescriptions  Pending Prescriptions Disp Refills   hydrOXYzine (VISTARIL) 25 MG capsule [Pharmacy Med Name: hydrOXYzine Pamoate 25 MG Oral Capsule] 30 capsule 0    Sig: TAKE 1 CAPSULE BY MOUTH EVERY 8 HOURS AS NEEDED     Ear, Nose, and Throat:  Antihistamines 2 Passed - 04/25/2022  8:47 PM      Passed - Cr in normal range and within 360 days    Creatinine  Date Value Ref Range Status  05/14/2014 1.10 0.60 - 1.30 mg/dL Final   Creatinine, Ser  Date Value Ref Range Status  03/15/2022 0.86 0.57 - 1.00 mg/dL Final         Passed - Valid encounter within last 12 months    Recent Outpatient Visits           1 month ago Possible exposure to STD   [facility] [place], [place], PA-C   7 months ago Annual physical exam   [facility] Merita Norton T, FNP   11 months ago Acute non-recurrent pansinusitis   [facility] [name], FNP   1 year ago Cellulitis of face   [facility] Maple Hudson., MD   1 year ago Breast pain   [facility] Osvaldo Angst [place], [place]

## 2022-04-27 ENCOUNTER — Other Ambulatory Visit: Payer: Self-pay | Admitting: Family Medicine

## 2022-04-27 DIAGNOSIS — F439 Reaction to severe stress, unspecified: Secondary | ICD-10-CM

## 2022-05-09 ENCOUNTER — Encounter: Payer: Self-pay | Admitting: Family Medicine

## 2022-05-09 ENCOUNTER — Ambulatory Visit (INDEPENDENT_AMBULATORY_CARE_PROVIDER_SITE_OTHER): Payer: 59 | Admitting: Family Medicine

## 2022-05-09 VITALS — BP 122/88 | HR 70 | Resp 16 | Ht 66.0 in | Wt 187.0 lb

## 2022-05-09 DIAGNOSIS — F339 Major depressive disorder, recurrent, unspecified: Secondary | ICD-10-CM | POA: Diagnosis not present

## 2022-05-09 MED ORDER — FLUOXETINE HCL 40 MG PO CAPS: 40.0000 mg | ORAL_CAPSULE | Freq: Every day | ORAL | 3 refills | Status: DC

## 2022-05-09 NOTE — Assessment & Plan Note (Signed)
Chronic, improved Notes lots of positive changes in past years Wishes to continue Prozac 40 mg

## 2022-05-09 NOTE — Progress Notes (Signed)
Established patient visit   Patient: [name]   DOB: 1976/05/25   46 y.o. Female  MRN: [id number] Visit Date: 05/09/2022  Today's healthcare provider: Gwyneth Sprout, FNP  Re Introduced to nurse practitioner role and practice setting.  All questions answered.  Discussed provider/patient relationship and expectations.  I,Tiffany J Bragg,acting as a scribe for Gwyneth Sprout, FNP.,have documented all relevant documentation on the behalf of Gwyneth Sprout, FNP,as directed by  Gwyneth Sprout, FNP while in the presence of Gwyneth Sprout, FNP.   Chief Complaint  Patient presents with   Depression   Subjective    HPI  Depression, Follow-up  She  was last seen for this 8 months ago. Changes made at last visit include stopped wellbutrin, taking prozac 54m.   She reports excellent compliance with treatment. She is not having side effects.   She reports excellent tolerance of treatment. She feels she is Improved since last visit.     05/09/2022    8:53 AM 03/15/2022    8:27 AM 09/18/2021   10:43 AM  Depression screen PHQ 2/9  Decreased Interest 0 0 2  Down, Depressed, Hopeless 0 0 2  PHQ - 2 Score 0 0 4  Altered sleeping _0 Tired, decreased energy _1 Change in appetite 0 0 2  Feeling bad or failure about yourself  0 0 1  Trouble concentrating 0 0 3  Moving slowly or fidgety/restless 0 0 0  Suicidal thoughts 0 0 0  PHQ-9 Score _2 Difficult doing work/chores Not difficult at all Not difficult at all     -----------------------------------------------------------------------------------------   Medications: Outpatient Medications Prior to Visit  Medication Sig   Fexofenadine HCl (ALLEGRA PO) Take by mouth as needed.   hydrOXYzine (VISTARIL) 25 MG capsule TAKE 1 CAPSULE BY MOUTH EVERY 8 HOURS AS NEEDED   traZODone (DESYREL) 100 MG tablet Take 1-2 tablets PO as needed to assist with sleep   valACYclovir (VALTREX) 500 MG tablet Take 2 tablets (1,000 mg  total) by mouth 2 (two) times daily.   [DISCONTINUED] FLUoxetine (PROZAC) 40 MG capsule TAKE 1 CAPSULE BY MOUTH ONCE DAILY . APPOINTMENT REQUIRED FOR FUTURE REFILLS   No facility-administered medications prior to visit.    Review of Systems  Last CBC Lab Results  Component Value Date   WBC 8.3 12/10/2021   HGB 15.4 (H) 12/10/2021   HCT 45.5 12/10/2021   MCV 95.4 12/10/2021   MCH 32.3 12/10/2021   RDW 11.9 12/10/2021   PLT 350 045/08/8880  Last metabolic panel Lab Results  Component Value Date   GLUCOSE 85 03/15/2022   NA 136 03/15/2022   K 5.0 03/15/2022   CL 100 03/15/2022   CO2 22 03/15/2022   BUN 9 03/15/2022   CREATININE 0.86 03/15/2022   EGFR 85 03/15/2022   CALCIUM 9.6 03/15/2022   PHOS 3.7 12/16/2016   PROT 7.2 03/15/2022   ALBUMIN 4.7 03/15/2022   LABGLOB 2.5 03/15/2022   AGRATIO 1.9 03/15/2022   BILITOT <0.2 03/15/2022   ALKPHOS 70 03/15/2022   AST 22 03/15/2022   ALT 19 03/15/2022   ANIONGAP 14 12/10/2021   Last lipids Lab Results  Component Value Date   CHOL 193 09/03/2021   HDL 111 09/03/2021   LDLCALC 70 09/03/2021   TRIG 68 09/03/2021   CHOLHDL 1.7 09/03/2021   Last hemoglobin A1c Lab Results  Component Value Date   HGBA1C  5.2 09/03/2021   Last thyroid functions Lab Results  Component Value Date   TSH 1.340 09/03/2021      Objective    BP 122/88 (BP Location: Right Arm, Patient Position: Sitting, Cuff Size: Normal)   Pulse 70   Resp 16   Ht _0  (1.676 m)   Wt 187 lb (84.8 kg)   SpO2 99%   BMI 30.18 kg/m   BP Readings from Last 3 Encounters:  05/09/22 122/88  03/15/22 (!) 142/85  12/10/21 (!) 122/91   Wt Readings from Last 3 Encounters:  05/09/22 187 lb (84.8 kg)  03/15/22 179 lb (81.2 kg)  12/10/21 165 lb (74.8 kg)   SpO2 Readings from Last 3 Encounters:  05/09/22 99%  03/15/22 99%  12/10/21 99%   Physical Exam Vitals and nursing note reviewed.  Constitutional:      General: She is not in acute distress.     Appearance: Normal appearance. She is obese. She is not ill-appearing, toxic-appearing or diaphoretic.  HENT:     Head: Normocephalic and atraumatic.  Cardiovascular:     Rate and Rhythm: Normal rate and regular rhythm.     Pulses: Normal pulses.     Heart sounds: Normal heart sounds. No murmur heard.    No friction rub. No gallop.  Pulmonary:     Effort: Pulmonary effort is normal. No respiratory distress.     Breath sounds: Normal breath sounds. No stridor. No wheezing, rhonchi or rales.  Chest:     Chest wall: No tenderness.  Musculoskeletal:        General: No swelling, tenderness, deformity or signs of injury. Normal range of motion.     Right lower leg: No edema.     Left lower leg: No edema.  Skin:    General: Skin is warm and dry.     Capillary Refill: Capillary refill takes less than 2 seconds.     Coloration: Skin is not jaundiced or pale.     Findings: No bruising, erythema, lesion or rash.  Neurological:     General: No focal deficit present.     Mental Status: She is alert and oriented to person, place, and time. Mental status is at baseline.     Cranial Nerves: No cranial nerve deficit.     Sensory: No sensory deficit.     Motor: No weakness.     Coordination: Coordination normal.  Psychiatric:        Mood and Affect: Mood normal.        Behavior: Behavior normal.        Thought Content: Thought content normal.        Judgment: Judgment normal.    No results found for any visits on 05/09/22.  Assessment & Plan     Problem List Items Addressed This Visit       Other   Depression, recurrent ([place]) - Primary    Chronic, improved Notes lots of positive changes in past years Wishes to continue Prozac 40 mg      Relevant Medications   FLUoxetine (PROZAC) 40 MG capsule   Return in about 4 months (around 09/08/2022) for annual examination.     Vonna Kotyk, FNP, have reviewed all documentation for this visit. The documentation on 05/09/22 for the exam,  diagnosis, procedures, and orders are all accurate and complete.  Gwyneth Sprout, [place] [phone] (phone) [phone] (fax)  [place]

## 2022-05-13 ENCOUNTER — Telehealth: Payer: 59 | Admitting: Family Medicine

## 2022-05-13 DIAGNOSIS — R3989 Other symptoms and signs involving the genitourinary system: Secondary | ICD-10-CM

## 2022-05-13 MED ORDER — NITROFURANTOIN MONOHYD MACRO 100 MG PO CAPS: 100.0000 mg | ORAL_CAPSULE | Freq: Two times a day (BID) | ORAL | 0 refills | Status: AC

## 2022-05-13 NOTE — Progress Notes (Signed)

## 2022-05-14 ENCOUNTER — Telehealth: Payer: 59 | Admitting: Nurse Practitioner

## 2022-05-14 DIAGNOSIS — B370 Candidal stomatitis: Secondary | ICD-10-CM

## 2022-05-14 DIAGNOSIS — N898 Other specified noninflammatory disorders of vagina: Secondary | ICD-10-CM

## 2022-05-14 MED ORDER — NYSTATIN 100000 UNIT/ML MT SUSP: 5.0000 mL | Freq: Four times a day (QID) | OROMUCOSAL | 0 refills | Status: DC

## 2022-05-14 MED ORDER — FLUCONAZOLE 150 MG PO TABS: 150.0000 mg | ORAL_TABLET | Freq: Once | ORAL | 0 refills | Status: AC

## 2022-05-14 NOTE — Progress Notes (Signed)
E-Visit for Vaginal Symptoms  We are sorry that you are not feeling well. Here is how we plan to help! Based on what you shared with me it looks like you: May have a yeast vaginosis  Vaginosis is an inflammation of the vagina that can result in discharge, itching and pain. The cause is usually a change in the normal balance of vaginal bacteria or an infection. Vaginosis can also result from reduced estrogen levels after menopause.  The most common causes of vaginosis are:   Bacterial vaginosis which results from an overgrowth of one on several organisms that are normally present in your vagina.   Yeast infections which are caused by a naturally occurring fungus called candida.   Vaginal atrophy (atrophic vaginosis) which results from the thinning of the vagina from reduced estrogen levels after menopause.   Trichomoniasis which is caused by a parasite and is commonly transmitted by sexual intercourse.  Factors that increase your risk of developing vaginosis include: Medications, such as antibiotics and steroids Uncontrolled diabetes Use of hygiene products such as bubble bath, vaginal spray or vaginal deodorant Douching Wearing damp or tight-fitting clothing Using an intrauterine device (IUD) for birth control Hormonal changes, such as those associated with pregnancy, birth control pills or menopause Sexual activity Having a sexually transmitted infection  Your treatment plan is A single Diflucan (fluconazole) 150mg  tablet once.  I have electronically sent this prescription into the pharmacy that you have chosen.  We will also call in an oral mouth wash for the thrush.   Meds ordered this encounter  Medications   fluconazole (DIFLUCAN) 150 MG tablet    Sig: Take 1 tablet (150 mg total) by mouth once for 1 dose.    Dispense:  1 tablet    Refill:  0   nystatin (MYCOSTATIN) 100000 UNIT/ML suspension    Sig: Take 5 mLs (500,000 Units total) by mouth 4 (four) times daily. Rinse and  spit    Dispense:  60 mL    Refill:  0     Be sure to take all of the medication as directed. Stop taking any medication if you develop a rash, tongue swelling or shortness of breath. Mothers who are breast feeding should consider pumping and discarding their breast milk while on these antibiotics. However, there is no consensus that infant exposure at these doses would be harmful.  Remember that medication creams can weaken latex condoms.   HOME CARE:  Good hygiene may prevent some types of vaginosis from recurring and may relieve some symptoms:  Avoid baths, hot tubs and whirlpool spas. Rinse soap from your outer genital area after a shower, and dry the area well to prevent irritation. Don't use scented or harsh soaps, such as those with deodorant or antibacterial action. Avoid irritants. These include scented tampons and pads. Wipe from front to back after using the toilet. Doing so avoids spreading fecal bacteria to your vagina.  Other things that may help prevent vaginosis include:  Don't douche. Your vagina doesn't require cleansing other than normal bathing. Repetitive douching disrupts the normal organisms that reside in the vagina and can actually increase your risk of vaginal infection. Douching won't clear up a vaginal infection. Use a latex condom. Both female and female latex condoms may help you avoid infections spread by sexual contact. Wear cotton underwear. Also wear pantyhose with a cotton crotch. If you feel comfortable without it, skip wearing underwear to bed. Yeast thrives in [place] Your symptoms should improve in  the next day or two.  GET HELP RIGHT AWAY IF:  You have pain in your lower abdomen ( pelvic area or over your ovaries) You develop nausea or vomiting You develop a fever Your discharge changes or worsens You have persistent pain with intercourse You develop shortness of breath, a rapid pulse, or you faint.  These symptoms could be signs of  problems or infections that need to be evaluated by a medical provider now.  MAKE SURE YOU   Understand these instructions. Will watch your condition. Will get help right away if you are not doing well or get worse.  Thank you for choosing an e-visit.  Your e-visit answers were reviewed by a board certified advanced clinical practitioner to complete your personal care plan. Depending upon the condition, your plan could have included both over the counter or prescription medications.  Please review your pharmacy choice. Make sure the pharmacy is open so you can pick up prescription now. If there is a problem, you may contact your provider through [employer] and have the prescription routed to another pharmacy.  Your safety is important to [place]. If you have drug allergies check your prescription carefully.   I spent approximately 5 minutes reviewing the patient's history, current symptoms and coordinating their care today.

## 2022-06-03 ENCOUNTER — Encounter: Payer: Self-pay | Admitting: Emergency Medicine

## 2022-06-03 ENCOUNTER — Other Ambulatory Visit: Payer: Self-pay

## 2022-06-03 ENCOUNTER — Encounter: Payer: Self-pay | Admitting: Nurse Practitioner

## 2022-06-03 ENCOUNTER — Ambulatory Visit
Admission: EM | Admit: 2022-06-03 | Discharge: 2022-06-03 | Disposition: A | Discharge status: Home / Self Care | Payer: 59 | Attending: Urgent Care | Admitting: Urgent Care

## 2022-06-03 DIAGNOSIS — R300 Dysuria: Secondary | ICD-10-CM

## 2022-06-03 LAB — POCT URINALYSIS DIP (MANUAL ENTRY)
Bilirubin, UA: NEGATIVE
Glucose, UA: NEGATIVE mg/dL
Ketones, POC UA: NEGATIVE mg/dL
Leukocytes, UA: NEGATIVE
Nitrite, UA: NEGATIVE
Protein Ur, POC: NEGATIVE mg/dL
Spec Grav, UA: 1.01 (ref 1.010–1.025)
Urobilinogen, UA: 0.2 E.U./dL
pH, UA: 6 (ref 5.0–8.0)

## 2022-06-03 NOTE — ED Provider Notes (Signed)
[facility]    CSN: [id number] Arrival date & time: 06/03/22  1500      History   Chief Complaint No chief complaint on file.   HPI [name] is a 47 y.o. female.   HPI  Patient presents with complaint of symptoms x 1 week.  She endorses fever Tmax 102.5.  Fever improves during the day and returns at night.  She is concerned for possible UTI.  She endorses pain in the back of her neck and shoulders as well as back pain.  She denies respiratory symptoms.  Denies nausea, vomiting, diarrhea.  She is using alternating Tylenol and ibuprofen for her symptoms.  Past Medical History:  Diagnosis Date   BRCA negative 07/2018   MyRisk neg   Family history of breast cancer    IBIS=13%/riskscore=8.4%   Family history of ovarian cancer    Family history of pancreatic cancer    History of frequent urinary tract infections    Hypoglycemia    IBS (irritable bowel syndrome)     Patient Active Problem List   Diagnosis Date Noted   Annual physical exam 09/03/2021   Encounter for screening mammogram for malignant neoplasm of breast 09/03/2021   Stress incontinence in female 09/03/2021   Daily headache 09/03/2021   Snoring 09/03/2021   Situational stress 09/03/2021   Depression, recurrent ([place]) 09/03/2021   Chronic left-sided low back pain with left-sided sciatica 09/03/2021   Continuous tobacco abuse 09/03/2021   Acute cough 05/23/2021   Elevated blood pressure reading in office without diagnosis of hypertension 05/23/2021   BRCA negative 08/10/2018    Past Surgical History:  Procedure Laterality Date   ABDOMINAL HYSTERECTOMY  07/2006   PARTIAL   BACK SURGERY     CHOLECYSTECTOMY     DIAGNOSTIC LAPAROSCOPY     test on bladder   GALLBLADDER SURGERY     LAPAROSCOPIC APPENDECTOMY N/A 12/08/2016   Procedure: APPENDECTOMY LAPAROSCOPIC;  Surgeon: Jules Husbands, MD;  Location: [facility] ORS;  Service: General;  Laterality: N/A;   MAXIMUM ACCESS (MAS)POSTERIOR LUMBAR  INTERBODY FUSION (PLIF) 1 LEVEL N/A 05/06/2013   Procedure: LUMBAR FIVE-SACRAL ONE MAXIMUM ACCESS POSTERIOR LUMBAR INTERBODY FUSION;  Surgeon: Eustace Moore, MD;  Location: [facility];  Service: Neurosurgery;  Laterality: N/A;   TOE SURGERY     TUBAL LIGATION     WISDOM TOOTH EXTRACTION      OB History     Gravida  2   Para  2   Term      Preterm      AB      Living  2      SAB      IAB      Ectopic      Multiple      Live Births               Home Medications    Prior to Admission medications   Medication Sig Start Date End Date Taking? Authorizing Provider  Fexofenadine HCl (ALLEGRA PO) Take by mouth as needed.    [provider]  FLUoxetine (PROZAC) 40 MG capsule Take 1 capsule (40 mg total) by mouth daily. 05/09/22   Gwyneth Sprout, FNP  hydrOXYzine (VISTARIL) 25 MG capsule TAKE 1 CAPSULE BY MOUTH EVERY 8 HOURS AS NEEDED 04/26/22   Ostwalt, Letitia Libra, PA-C  nystatin (MYCOSTATIN) 100000 UNIT/ML suspension Take 5 mLs (500,000 Units total) by mouth 4 (four) times daily. Rinse and spit 05/14/22   Jerline Pain,  Judson Roch, FNP  traZODone (DESYREL) 100 MG tablet Take 1-2 tablets PO as needed to assist with sleep 04/03/22   Gwyneth Sprout, FNP  valACYclovir (VALTREX) 500 MG tablet Take 2 tablets (1,000 mg total) by mouth 2 (two) times daily. 03/18/22   Gwyneth Sprout, FNP    Family History Family History  Problem Relation Age of Onset   Hypertension Mother    COPD Mother    Epilepsy Mother    Sleep apnea Mother    Hypertension Father    Sleep apnea Father    Breast cancer Maternal Aunt 39   Ovarian cancer Maternal Aunt 45   Colon cancer Paternal Uncle 81   Pancreatic cancer Maternal Grandmother 56   Colon cancer Maternal Grandfather 63    Social History Social History   Tobacco Use   Smoking status: Every Day    Packs/day: 0.50    Years: 20.00    Total pack years: 10.00    Types: Cigarettes    Last attempt to quit: 05/30/2018    Years since quitting:  4.0   Smokeless tobacco: Never  Vaping Use   Vaping Use: Never used  Substance Use Topics   Alcohol use: Not Currently    Alcohol/week: 40.0 standard drinks of alcohol    Types: 40 Cans of beer per week   Drug use: No     Allergies   Codeine, Sulfa antibiotics, and Vicodin [hydrocodone-acetaminophen]   Review of Systems Review of Systems   Physical Exam Triage Vital Signs ED Triage Vitals  Enc Vitals Group     BP 06/03/22 1644 (!) 141/93     Pulse Rate 06/03/22 1644 75     Resp 06/03/22 1644 14     Temp 06/03/22 1644 97.9 F (36.6 C)     Temp Source 06/03/22 1644 Temporal     SpO2 06/03/22 1644 96 %     Weight --      Height --      Head Circumference --      Peak Flow --      Pain Score 06/03/22 1653 9     Pain Loc --      Pain Edu? --      Excl. in [place]? --    No data found.  Updated Vital Signs BP (!) 141/93 (BP Location: Right Arm)   Pulse 75   Temp 97.9 F (36.6 C) (Temporal)   Resp 14   SpO2 96%   Visual Acuity Right Eye Distance:   Left Eye Distance:   Bilateral Distance:    Right Eye Near:   Left Eye Near:    Bilateral Near:     Physical Exam Vitals reviewed.  Constitutional:      Appearance: Normal appearance.  Skin:    General: Skin is warm and dry.  Neurological:     General: No focal deficit present.     Mental Status: She is alert and oriented to person, place, and time.  Psychiatric:        Mood and Affect: Mood normal.        Behavior: Behavior normal.      UC Treatments / Results  Labs (all labs ordered are listed, but only abnormal results are displayed) Labs Reviewed  POCT URINALYSIS DIP (MANUAL ENTRY)    EKG   Radiology No results found.  Procedures Procedures (including critical care time)  Medications Ordered in UC Medications - No data to display  Initial Impression / Assessment and Plan /  UC Course  I have reviewed the triage vital signs and the nursing notes.  Pertinent labs & imaging results that  were available during my care of the patient were reviewed by me and considered in my medical decision making (see chart for details).   Patient is afebrile here without recent antipyretics. Satting well on room air. Overall is well appearing, well hydrated, without respiratory distress.   Unclear etiology for her symptoms.  UA is negative for signs of UTI.  Agreed to watch and wait.  Final Clinical Impressions(s) / UC Diagnoses   Final diagnoses:  None   Discharge Instructions   None    ED Prescriptions   None    PDMP not reviewed this encounter.   Rose Phi, [place] 06/03/22 1715

## 2022-06-03 NOTE — Discharge Instructions (Addendum)
Follow up here or with your primary care provider if your symptoms are worsening or not improving.     

## 2022-06-03 NOTE — ED Triage Notes (Signed)
Complains of headache for a week.  Patient reports fever as high as 102.5. fever goes down during the day and return at night.  Patient did a store test for uti and results indicate potential uti.Patient tried to have a tele-visit.  Provider said she needs to be seen

## 2022-06-03 NOTE — Progress Notes (Signed)
Because you have had a UTI in the past 30 days, you have a fever and back pain I feel your condition warrants further evaluation and I recommend that you be seen in a face to face visit.   NOTE: There will be NO CHARGE for this eVisit   If you are having a true medical emergency please call 911.      For an urgent face to face visit, [facility] has seven urgent care centers for your convenience:     [facility] [facility] [place] Get Driving Directions [phone] [address], [place] [address]    [facility]) Get Driving Directions [phone] [place], [place] [address]  [facility] [place] [facility]) Get Driving Directions [phone] [address] [place],  [place]  [address]  [facility] - at [address] Get Driving Directions  [phone] [place],  [place] [address]   [facility]  Get Driving Directions [phone] [place], [place], [place] [address]   [facility] [place] Get Driving Directions  [phone] [address] [place], [place] [address]   [facility] [place] Get Driving Directions [phone] [address], [place], [place] [address]  Your MyChart E-visit questionnaire answers were reviewed by a board certified advanced clinical practitioner to complete your personal care plan based on your specific symptoms.  Thank you for using e-Visits.

## 2022-06-04 ENCOUNTER — Encounter: Payer: Self-pay | Admitting: Emergency Medicine

## 2022-06-04 ENCOUNTER — Emergency Department: Payer: 59

## 2022-06-04 ENCOUNTER — Encounter: Payer: Self-pay | Admitting: Family Medicine

## 2022-06-04 ENCOUNTER — Emergency Department
Admission: EM | Admit: 2022-06-04 | Discharge: 2022-06-05 | Disposition: A | Discharge status: Home / Self Care | Payer: 59 | Source: Home / Self Care | Attending: Emergency Medicine | Admitting: Emergency Medicine

## 2022-06-04 ENCOUNTER — Ambulatory Visit: Payer: 59 | Admitting: Family Medicine

## 2022-06-04 VITALS — BP 136/90 | HR 84 | Temp 99.2°F | Wt 196.1 lb

## 2022-06-04 DIAGNOSIS — R519 Headache, unspecified: Secondary | ICD-10-CM

## 2022-06-04 DIAGNOSIS — R509 Fever, unspecified: Secondary | ICD-10-CM

## 2022-06-04 DIAGNOSIS — B349 Viral infection, unspecified: Secondary | ICD-10-CM | POA: Insufficient documentation

## 2022-06-04 DIAGNOSIS — Z1152 Encounter for screening for COVID-19: Secondary | ICD-10-CM | POA: Insufficient documentation

## 2022-06-04 DIAGNOSIS — R52 Pain, unspecified: Secondary | ICD-10-CM

## 2022-06-04 DIAGNOSIS — J101 Influenza due to other identified influenza virus with other respiratory manifestations: Secondary | ICD-10-CM | POA: Diagnosis not present

## 2022-06-04 LAB — CBC WITH DIFFERENTIAL/PLATELET
Abs Immature Granulocytes: 0.06 10*3/uL (ref 0.00–0.07)
Abs Immature Granulocytes: 0.04 10*3/uL (ref 0.00–0.07)
Basophils Absolute: 0.1 10*3/uL (ref 0.0–0.1)
Basophils Absolute: 0.1 10*3/uL (ref 0.0–0.1)
Basophils Relative: 1 %
Basophils Relative: 1 %
Eosinophils Absolute: 0.4 10*3/uL (ref 0.0–0.5)
Eosinophils Absolute: 0.4 10*3/uL (ref 0.0–0.5)
Eosinophils Relative: 6 %
Eosinophils Relative: 7 %
HCT: 39 % (ref 36.0–46.0)
HCT: 37.7 % (ref 36.0–46.0)
Hemoglobin: 13 g/dL (ref 12.0–15.0)
Hemoglobin: 12.6 g/dL (ref 12.0–15.0)
Immature Granulocytes: 1 %
Immature Granulocytes: 1 %
Lymphocytes Relative: 38 %
Lymphocytes Relative: 42 %
Lymphs Abs: 2.7 10*3/uL (ref 0.7–4.0)
Lymphs Abs: 2.5 10*3/uL (ref 0.7–4.0)
MCH: 31.3 pg (ref 26.0–34.0)
MCH: 31.4 pg (ref 26.0–34.0)
MCHC: 33.3 g/dL (ref 30.0–36.0)
MCHC: 33.4 g/dL (ref 30.0–36.0)
MCV: 94 fL (ref 80.0–100.0)
MCV: 94 fL (ref 80.0–100.0)
Monocytes Absolute: 0.6 10*3/uL (ref 0.1–1.0)
Monocytes Absolute: 0.5 10*3/uL (ref 0.1–1.0)
Monocytes Relative: 8 %
Monocytes Relative: 9 %
Neutro Abs: 3.4 10*3/uL (ref 1.7–7.7)
Neutro Abs: 2.4 10*3/uL (ref 1.7–7.7)
Neutrophils Relative %: 46 %
Neutrophils Relative %: 40 %
Platelets: 218 10*3/uL (ref 150–400)
Platelets: 232 10*3/uL (ref 150–400)
RBC: 4.15 MIL/uL (ref 3.87–5.11)
RBC: 4.01 MIL/uL (ref 3.87–5.11)
RDW: 11.6 % (ref 11.5–15.5)
RDW: 11.9 % (ref 11.5–15.5)
Smear Review: NORMAL
Smear Review: NORMAL
WBC: 7.2 10*3/uL (ref 4.0–10.5)
WBC: 6 10*3/uL (ref 4.0–10.5)
nRBC: 0 % (ref 0.0–0.2)
nRBC: 0 % (ref 0.0–0.2)

## 2022-06-04 LAB — RESP PANEL BY RT-PCR (RSV, FLU A&B, COVID)  RVPGX2
Influenza A by PCR: NEGATIVE
Influenza B by PCR: NEGATIVE
Resp Syncytial Virus by PCR: NEGATIVE
SARS Coronavirus 2 by RT PCR: NEGATIVE

## 2022-06-04 LAB — COMPREHENSIVE METABOLIC PANEL
ALT: 163 U/L — ABNORMAL HIGH (ref 0–44)
AST: 140 U/L — ABNORMAL HIGH (ref 15–41)
Albumin: 3.9 g/dL (ref 3.5–5.0)
Alkaline Phosphatase: 166 U/L — ABNORMAL HIGH (ref 38–126)
Anion gap: 11 (ref 5–15)
BUN: 9 mg/dL (ref 6–20)
CO2: 22 mmol/L (ref 22–32)
Calcium: 8.8 mg/dL — ABNORMAL LOW (ref 8.9–10.3)
Chloride: 101 mmol/L (ref 98–111)
Creatinine, Ser: 0.72 mg/dL (ref 0.44–1.00)
GFR, Estimated: >60 mL/min (ref >60)
Glucose, Bld: 92 mg/dL (ref 70–99)
Potassium: 3.8 mmol/L (ref 3.5–5.1)
Sodium: 134 mmol/L — ABNORMAL LOW (ref 135–145)
Total Bilirubin: 0.5 mg/dL (ref 0.3–1.2)
Total Protein: 7.6 g/dL (ref 6.5–8.1)

## 2022-06-04 LAB — PROTEIN AND GLUCOSE, CSF
Glucose, CSF: 51 mg/dL (ref 40–70)
Total  Protein, CSF: 27 mg/dL (ref 15–45)

## 2022-06-04 LAB — URINALYSIS, ROUTINE W REFLEX MICROSCOPIC
Bacteria, UA: NONE SEEN
Bilirubin Urine: NEGATIVE
Glucose, UA: NEGATIVE mg/dL
Ketones, ur: NEGATIVE mg/dL
Leukocytes,Ua: NEGATIVE
Nitrite: NEGATIVE
Protein, ur: NEGATIVE mg/dL
Specific Gravity, Urine: 1.016 (ref 1.005–1.030)
pH: 5 (ref 5.0–8.0)

## 2022-06-04 LAB — CSF CELL COUNT WITH DIFFERENTIAL
Eosinophils, CSF: 0 %
Lymphs, CSF: 85 %
Monocyte-Macrophage-Spinal Fluid: 15 %
RBC Count, CSF: 2 /mm3 (ref 0–3)
RBC Count, CSF: 2 /mm3 (ref 0–3)
Segmented Neutrophils-CSF: 0 %
Tube #: 1
Tube #: 4
WBC, CSF: 2 /mm3 (ref 0–5)
WBC, CSF: 5 /mm3 (ref 0–5)

## 2022-06-04 LAB — POCT INFLUENZA A/B
Influenza A, POC: NEGATIVE
Influenza B, POC: NEGATIVE

## 2022-06-04 LAB — POC COVID19 BINAXNOW: SARS Coronavirus 2 Ag: NEGATIVE

## 2022-06-04 MED ORDER — MORPHINE SULFATE (PF) 4 MG/ML IV SOLN
4.0000 mg | Freq: Once | INTRAVENOUS | Status: AC
  Administered 2022-06-04: 4 mg via INTRAVENOUS
  Filled 2022-06-04: qty 1

## 2022-06-04 MED ORDER — DEXAMETHASONE SODIUM PHOSPHATE 10 MG/ML IJ SOLN
10.0000 mg | Freq: Once | INTRAMUSCULAR | Status: AC
  Administered 2022-06-04: 10 mg via INTRAVENOUS
  Filled 2022-06-04: qty 1

## 2022-06-04 MED ORDER — SODIUM CHLORIDE 0.9 % IV BOLUS
1000.0000 mL | Freq: Once | INTRAVENOUS | Status: AC
  Administered 2022-06-04: 1000 mL via INTRAVENOUS

## 2022-06-04 MED ORDER — CYCLOBENZAPRINE HCL 10 MG PO TABS: 10.0000 mg | ORAL_TABLET | Freq: Three times a day (TID) | ORAL | 0 refills | Status: DC | PRN

## 2022-06-04 MED ORDER — KETOROLAC TROMETHAMINE 30 MG/ML IJ SOLN
15.0000 mg | Freq: Once | INTRAMUSCULAR | Status: AC
  Administered 2022-06-04: 15 mg via INTRAVENOUS
  Filled 2022-06-04: qty 1

## 2022-06-04 MED ORDER — KETOROLAC TROMETHAMINE 60 MG/2ML IM SOLN
60.0000 mg | Freq: Once | INTRAMUSCULAR | Status: AC
  Administered 2022-06-04: 30 mg via INTRAMUSCULAR

## 2022-06-04 MED ORDER — DIPHENHYDRAMINE HCL 50 MG/ML IJ SOLN
25.0000 mg | Freq: Once | INTRAMUSCULAR | Status: AC
  Administered 2022-06-04: 25 mg via INTRAVENOUS
  Filled 2022-06-04: qty 1

## 2022-06-04 MED ORDER — OXYCODONE HCL 5 MG PO TABS: 5.0000 mg | ORAL_TABLET | Freq: Four times a day (QID) | ORAL | 0 refills | Status: DC | PRN

## 2022-06-04 MED ORDER — METOCLOPRAMIDE HCL 5 MG/ML IJ SOLN
10.0000 mg | Freq: Once | INTRAMUSCULAR | Status: AC
  Administered 2022-06-04: 10 mg via INTRAVENOUS
  Filled 2022-06-04: qty 2

## 2022-06-04 MED ORDER — OXYCODONE HCL 5 MG PO TABS
5.0000 mg | ORAL_TABLET | Freq: Once | ORAL | Status: AC
  Administered 2022-06-04: 5 mg via ORAL
  Filled 2022-06-04: qty 1

## 2022-06-04 MED ORDER — MIDAZOLAM HCL 2 MG/2ML IJ SOLN
2.0000 mg | Freq: Once | INTRAMUSCULAR | Status: AC
  Administered 2022-06-04: 2 mg via INTRAVENOUS
  Filled 2022-06-04: qty 2

## 2022-06-04 NOTE — ED Triage Notes (Signed)
Pt via POV from home. Pt c/o fatigue, neck pain, headache, and fever. States it has been going since last Tuesday. PCP sent over for possible meningitis. States they tested her today for COVID and flu it was negative. Pt took Tylenol at 12:30. Pt is A&Ox4 and NAD

## 2022-06-04 NOTE — Discharge Instructions (Signed)
For your symptoms:  Take the oxycodone and flexeril.   You can take over-the-counter Ibuprofen 600 mg every 6 hours.  STOP taking Tylenol as this can cause liver inflammation.  Follow-up with your doctor in the next 3-5 days for repeat labs and check-up.

## 2022-06-04 NOTE — ED Notes (Signed)
Pt care taken, drew labs and blood cultures. A&O x4. Will give meds

## 2022-06-04 NOTE — Progress Notes (Addendum)
I,Joseline E Rosas,acting as a scribe for [employer], MD.,have documented all relevant documentation on the behalf of Doris Foster, MD,as directed by  Doris Foster, MD while in the presence of Doris Foster, MD.   Established patient visit   Patient: [name]   DOB: 10-16-1975   47 y.o. Female  MRN: [id number] Visit Date: 06/04/2022  Today's healthcare provider: [name], MD   Chief Complaint  Patient presents with   Fever   Subjective    Fever  This is a new problem. The current episode started in the past 7 days (Thursday). The problem occurs constantly. The problem has been unchanged. The maximum temperature noted was 101 to 101.9 F. The temperature was taken using an oral thermometer. Associated symptoms include headaches ("a week") and muscle aches. Associated symptoms comments: Fatigue. She has tried acetaminophen and NSAIDs for the symptoms.    Patient went to the UC yesterday for possible UTI.  Reports that she got a shot for headache at [facility] on Friday.  Patient reports occipital headache in setting of fever (tmax 102.5). she denies sick contacts  She reports mildly sore throat in the Am that resolves throughout the day  She reports having elevated blood pressure as well  She states having rare cough  She has noticed occasional papules on her abdomen that resolve spontaneously, denies any other rashes  She states some maxillary sinus pain with coughing only  Headache minimally improved with NSAIDS but returns with similar severity soon after medication  She reports intermittent mild abdominal cramping  She reports having decreased energy, mild nausea, and decreased appetite  She has no known sick contacts      Medications: Outpatient Medications Prior to Visit  Medication Sig   FLUoxetine (PROZAC) 40 MG capsule Take 1 capsule (40 mg total) by mouth daily.   hydrOXYzine (VISTARIL) 25 MG  capsule TAKE 1 CAPSULE BY MOUTH EVERY 8 HOURS AS NEEDED   traZODone (DESYREL) 100 MG tablet Take 1-2 tablets PO as needed to assist with sleep   valACYclovir (VALTREX) 500 MG tablet Take 2 tablets (1,000 mg total) by mouth 2 (two) times daily.   Fexofenadine HCl (ALLEGRA PO) Take by mouth as needed.   nystatin (MYCOSTATIN) 100000 UNIT/ML suspension Take 5 mLs (500,000 Units total) by mouth 4 (four) times daily. Rinse and spit   No facility-administered medications prior to visit.    Review of Systems  Constitutional:  Positive for fever.  Neurological:  Positive for headaches ("a week").       Objective    BP (!) 136/90 (BP Location: Left Arm, Patient Position: Sitting, Cuff Size: Normal)   Pulse 84   Temp 99.2 F (37.3 C) (Oral)   Wt 196 lb 1.6 oz (89 kg)   BMI 31.65 kg/m    Physical Exam Constitutional:      Appearance: She is ill-appearing. She is not diaphoretic.     Comments: Patient feels warm to touch   Eyes:     General: No scleral icterus.    Conjunctiva/sclera: Conjunctivae normal.  Cardiovascular:     Heart sounds: Normal heart sounds.  Pulmonary:     Effort: Pulmonary effort is normal. No respiratory distress.     Breath sounds: Normal breath sounds. No stridor. No wheezing, rhonchi or rales.  Abdominal:     General: There is no distension.     Palpations: Abdomen is soft.     Tenderness: There is no abdominal tenderness.  Skin:  Capillary Refill: Capillary refill takes 2 to 3 seconds.     Coloration: Skin is pale.     Findings: No lesion.     Comments: Scattered papules on abdomen and lumbar region of back   Neurological:     Mental Status: She is alert and oriented to person, place, and time.     Comments: Negative brudzinski sign Negative for stiff neck Neck normal ROM         Results for orders placed or performed in visit on 06/04/22  POC COVID-19  Result Value Ref Range   SARS Coronavirus 2 Ag Negative Negative  POCT Influenza A/B   Result Value Ref Range   Influenza A, POC Negative Negative   Influenza B, POC Negative Negative    Assessment & Plan     Problem List Items Addressed This Visit       Other   Fever - Primary    Reported elevated fever of 102.5  Persistent HA and neck pain concerning for meningitis  Negative COVID and influenza testing in office  Recommended ED evaluation and possible lumbar puncture to assess for meningitis Patient voiced understanding and was in agreement with this plan         Relevant Orders   POC COVID-19 (Completed)   POCT Influenza A/B (Completed)   Body aches    Negative COVID and influenza tests in office  Problem has been present for 1 week  Specifically neck tenderness  Toradol 30mg  dose given in clinic today       Relevant Medications   ketorolac (TORADOL) injection 60 mg (Start on 06/04/2022 12:00 PM)     No follow-ups on file.     I, Doris Foster, MD, have reviewed all documentation for this visit.  Portions of this information were initially documented by the CMA and reviewed by me for thoroughness and accuracy.      Doris Foster, MD  [facility] [phone] (phone) [phone] (fax)  [place]

## 2022-06-04 NOTE — Assessment & Plan Note (Signed)
Reported elevated fever of 102.5  Persistent HA and neck pain concerning for meningitis  Negative COVID and influenza testing in office  Recommended ED evaluation and possible lumbar puncture to assess for meningitis Patient voiced understanding and was in agreement with this plan

## 2022-06-04 NOTE — ED Provider Triage Note (Signed)
  Emergency Medicine Provider Triage Evaluation Note  Lakasha Mcfall , a 47 y.o.female,  was evaluated in triage.  Pt complains of headache/neck pain/fever.  Patient states that she has had the symptoms for over a week.  Endorses max fever of 102.5 at home.  Additionally reports some lower abdominal pain as well, and believes she may have a urinary tract infection.  When she was seen by her family nurse practitioner, expressed concern for possible meningitis given the headache/neck stiffness.   Review of Systems  Positive: Headache, neck pain, fever, lower abdominal pain. Negative: Denies fever, chest pain, vomiting  Physical Exam  There were no vitals filed for this visit. Gen:   Awake, no distress   Resp:  Normal effort  MSK:   Moves extremities without difficulty  Other:    Medical Decision Making  Given the patient's initial medical screening exam, the following diagnostic evaluation has been ordered. The patient will be placed in the appropriate treatment space, once one is available, to complete the evaluation and treatment. I have discussed the plan of care with the patient and I have advised the patient that an ED physician or mid-level practitioner will reevaluate their condition after the test results have been received, as the results may give them additional insight into the type of treatment they may need.    Diagnostics: Labs, urinalysis, head CT, respiratory panel.  Treatments: none immediately   Teodoro Spray, [place] 06/04/22 1324

## 2022-06-04 NOTE — ED Provider Notes (Signed)
[facility] Provider Note    Event Date/Time   First MD Initiated Contact with Patient 06/04/22 1841     (approximate)   History   Headache and Fever   HPI  [name] is a 47 y.o. female  here with headache and neck pain. Pt reports that for the past week, she has had essentially daily headaches and fevers for the past 4 days. She has had temp up to 102. She has had no other sx other than headache and neck pain, which she feels in bilateral neck radiating down her neck. No overt photophobia. No abd pain. No cough or sputum production. No urinary sx. She was seen by PCP and sent here for eval of meningitis. No h/o same.       Physical Exam   Triage Vital Signs: ED Triage Vitals [06/04/22 1345]  Enc Vitals Group     BP (!) 147/90     Pulse Rate 72     Resp 20     Temp 99.3 F (37.4 C)     Temp Source Oral     SpO2 98 %     Weight 196 lb (88.9 kg)     Height 5' 6.5" (1.689 m)     Head Circumference      Peak Flow      Pain Score 8     Pain Loc      Pain Edu?      Excl. in [place]?     Most recent vital signs: Vitals:   06/04/22 2130 06/04/22 2230  BP: 138/88 136/78  Pulse: 71 67  Resp: 19 18  Temp: 99 F (37.2 C) 98 F (36.7 C)  SpO2: 97% 98%     General: Awake, no distress.  CV:  Good peripheral perfusion. RRR. No murmurs. Resp:  Normal effort. Lungs CTAB. Abd:  No distention. No tenderness. Other:  CNII-XII intact. Strength 5/5 bilateral UE and LE. Normal sensation to light touch. No overt photophobia or meningismus.    ED Results / Procedures / Treatments   Labs (all labs ordered are listed, but only abnormal results are displayed) Labs Reviewed  COMPREHENSIVE METABOLIC PANEL - Abnormal; Notable for the following components:      Result Value   Sodium 134 (*)    Calcium 8.8 (*)    AST 140 (*)    ALT 163 (*)    Alkaline Phosphatase 166 (*)    All other components within normal limits  URINALYSIS, ROUTINE W REFLEX  MICROSCOPIC - Abnormal; Notable for the following components:   Color, Urine YELLOW (*)    APPearance HAZY (*)    Hgb urine dipstick SMALL (*)    All other components within normal limits  RESP PANEL BY RT-PCR (RSV, FLU A&B, COVID)  RVPGX2  CSF CULTURE W GRAM STAIN  CULTURE, BLOOD (ROUTINE X 2)  CULTURE, BLOOD (ROUTINE X 2)  RESPIRATORY PANEL BY PCR  CBC WITH DIFFERENTIAL/PLATELET  CSF CELL COUNT WITH DIFFERENTIAL  CSF CELL COUNT WITH DIFFERENTIAL  PROTEIN AND GLUCOSE, CSF  CBC WITH DIFFERENTIAL/PLATELET  HIV ANTIBODY (ROUTINE TESTING W REFLEX)  HSV 1/2 PCR, CSF  ROCKY MTN SPOTTED FVR ABS PNL(IGG+IGM)  COMPREHENSIVE METABOLIC PANEL  ACETAMINOPHEN LEVEL  HEPATITIS PANEL, ACUTE     EKG    RADIOLOGY CT Head; NAICA   I also independently reviewed and agree with radiologist interpretations.   PROCEDURES:  Critical Care performed: No  .Lumbar Puncture  Date/Time: 06/04/2022 7:44 PM  Performed by: Duffy Bruce, MD Authorized by: Duffy Bruce, MD   Consent:    Consent obtained:  Written   Consent given by:  Patient   Risks, benefits, and alternatives were discussed: yes     Risks discussed:  Bleeding, headache, infection and nerve damage   Alternatives discussed:  Alternative treatment Universal protocol:    Procedure explained and questions answered to patient or proxy's satisfaction: yes     Relevant documents present and verified: yes     Test results available: yes     Imaging studies available: yes     Required blood products, implants, devices, and special equipment available: yes     Immediately prior to procedure a time out was called: yes     Site/side marked: yes     Patient identity confirmed:  Verbally with patient Pre-procedure details:    Procedure purpose:  Diagnostic   Preparation: Patient was prepped and draped in usual sterile fashion   Anesthesia:    Anesthesia method:  Local infiltration   Local anesthetic:  Lidocaine 1% w/o  epi Procedure details:    Lumbar space:  L3-L4 interspace   Patient position:  L lateral decubitus   Needle gauge:  20   Needle type:  Spinal needle - Quincke tip   Needle length (in):  3.5   Ultrasound guidance: no     Number of attempts:  1   Opening pressure (cm H2O):  20   Fluid appearance:  Clear   Tubes of fluid:  4   Total volume (ml):  10 Post-procedure details:    Puncture site:  Adhesive bandage applied   Procedure completion:  Tolerated     MEDICATIONS ORDERED IN ED: Medications  dexamethasone (DECADRON) injection 10 mg (10 mg Intravenous Given 06/04/22 1953)  metoCLOPramide (REGLAN) injection 10 mg (10 mg Intravenous Given 06/04/22 1956)  diphenhydrAMINE (BENADRYL) injection 25 mg (25 mg Intravenous Given 06/04/22 1953)  sodium chloride 0.9 % bolus 1,000 mL (0 mLs Intravenous Stopped 06/04/22 2203)  midazolam (VERSED) injection 2 mg (2 mg Intravenous Given 06/04/22 2116)  morphine (PF) 4 MG/ML injection 4 mg (4 mg Intravenous Given 06/04/22 2205)  sodium chloride 0.9 % bolus 1,000 mL (1,000 mLs Intravenous New Bag/Given 06/04/22 2203)  ketorolac (TORADOL) 30 MG/ML injection 15 mg (15 mg Intravenous Given 06/04/22 2205)     IMPRESSION / MDM / [place] / ED COURSE  I reviewed the triage vital signs and the nursing notes.                              Differential diagnosis includes, but is not limited to, meningitis, encephalitis, viral syndrome, mono, covid-19, influenza, autoimmune condition.  Patient's presentation is most consistent with acute presentation with potential threat to life or bodily function.  The patient is on the cardiac monitor to evaluate for evidence of arrhythmia and/or significant heart rate changes.  47 yo F here with headache x 2 weeks with fevers. Suspect viral syndrome, possibly RMSF or other a atypical infection. Low concern for meningococcus based on well appearance. VS are stable. Labs overall very reassuring. CBC with no leukocytosis.  BMP with Na 134, IVF given. COVID, flu, RSV negative. UA negative. CT head negative. Abdomen is soft, Nt, ND, with no guarding or rebound.   Of note, LFTs slightly elevated. I suspect this is 2/2 underlying viral process causing her sx. Broad viral panels and [employer] sent,  as well as HIV. LP performed, shows <5 WBCs, OP<20, and no organisms - doubt meningitis, encephalitis though could certainly be a resolving viral process. No apparent emergent pathology.  Will f/u tylenol level, rx analgesia and muscle relaxants for her neck pain which appears to be bilateral paraspinal, not midline, and she has no signs on labs to suggest cervical osteo or abscess, and d/c home if negative.   FINAL CLINICAL IMPRESSION(S) / ED DIAGNOSES   Final diagnoses:  Acute nonintractable headache, unspecified headache type  Viral syndrome     Rx / DC Orders   ED Discharge Orders          Ordered    oxyCODONE (ROXICODONE) 5 MG immediate release tablet  Every 6 hours PRN        06/04/22 2324    cyclobenzaprine (FLEXERIL) 10 MG tablet  3 times daily PRN        06/04/22 2324             Note:  This document was prepared using Dragon voice recognition software and may include unintentional dictation errors.   Shaune Pollack, MD 06/04/22 [phone]

## 2022-06-04 NOTE — ED Notes (Signed)
Pt moved into 10 for LP gave report to [facility]

## 2022-06-04 NOTE — Assessment & Plan Note (Addendum)
Negative COVID and influenza tests in office  Problem has been present for 1 week  Specifically neck tenderness  Toradol 30mg  dose given in clinic today

## 2022-06-05 ENCOUNTER — Telehealth: Payer: Self-pay | Admitting: Medical Oncology

## 2022-06-05 LAB — HEPATITIS PANEL, ACUTE
HCV Ab: NONREACTIVE
Hep A IgM: NONREACTIVE
Hep B C IgM: NONREACTIVE
Hepatitis B Surface Ag: NONREACTIVE

## 2022-06-05 LAB — COMPREHENSIVE METABOLIC PANEL
ALT: 136 U/L — ABNORMAL HIGH (ref 0–44)
AST: 120 U/L — ABNORMAL HIGH (ref 15–41)
Albumin: 2.9 g/dL — ABNORMAL LOW (ref 3.5–5.0)
Alkaline Phosphatase: 142 U/L — ABNORMAL HIGH (ref 38–126)
Anion gap: 10 (ref 5–15)
BUN: 10 mg/dL (ref 6–20)
CO2: 18 mmol/L — ABNORMAL LOW (ref 22–32)
Calcium: 7.4 mg/dL — ABNORMAL LOW (ref 8.9–10.3)
Chloride: 105 mmol/L (ref 98–111)
Creatinine, Ser: 0.74 mg/dL (ref 0.44–1.00)
GFR, Estimated: >60 mL/min (ref >60)
Glucose, Bld: 223 mg/dL — ABNORMAL HIGH (ref 70–99)
Potassium: 3.3 mmol/L — ABNORMAL LOW (ref 3.5–5.1)
Sodium: 133 mmol/L — ABNORMAL LOW (ref 135–145)
Total Bilirubin: 0.4 mg/dL (ref 0.3–1.2)
Total Protein: 5.7 g/dL — ABNORMAL LOW (ref 6.5–8.1)

## 2022-06-05 LAB — ACETAMINOPHEN LEVEL: Acetaminophen (Tylenol), Serum: <10 ug/mL — ABNORMAL LOW (ref 10–30)

## 2022-06-05 LAB — HIV ANTIBODY (ROUTINE TESTING W REFLEX): HIV Screen 4th Generation wRfx: NONREACTIVE

## 2022-06-05 NOTE — Telephone Encounter (Unsigned)
Lab called, pts covid swab was sent in the wrong tube so unable to run.

## 2022-06-05 NOTE — ED Provider Notes (Signed)
-----------------------------------------   2:14 AM on 06/05/2022 -----------------------------------------   Acetaminophen level unremarkable.  Patient will be discharged per previous providers plan.  Strict return precautions given.  Patient verbalizes understanding and agrees with plan of care.   Paulette Blanch, MD 06/05/22 [phone]

## 2022-06-06 ENCOUNTER — Inpatient Hospital Stay
Admission: EM | Admit: 2022-06-06 | Discharge: 2022-06-13 | DRG: 194 | Disposition: A | Discharge status: Home / Self Care | Payer: 59 | Attending: Internal Medicine | Admitting: Internal Medicine

## 2022-06-06 ENCOUNTER — Other Ambulatory Visit: Payer: Self-pay

## 2022-06-06 ENCOUNTER — Emergency Department: Payer: 59

## 2022-06-06 ENCOUNTER — Ambulatory Visit: Payer: Self-pay | Admitting: *Deleted

## 2022-06-06 DIAGNOSIS — Z6832 Body mass index (BMI) 32.0-32.9, adult: Secondary | ICD-10-CM

## 2022-06-06 DIAGNOSIS — J101 Influenza due to other identified influenza virus with other respiratory manifestations: Principal | ICD-10-CM | POA: Diagnosis present

## 2022-06-06 DIAGNOSIS — E669 Obesity, unspecified: Secondary | ICD-10-CM | POA: Diagnosis present

## 2022-06-06 DIAGNOSIS — B259 Cytomegaloviral disease, unspecified: Secondary | ICD-10-CM | POA: Diagnosis present

## 2022-06-06 DIAGNOSIS — Z820 Family history of epilepsy and other diseases of the nervous system: Secondary | ICD-10-CM

## 2022-06-06 DIAGNOSIS — Z803 Family history of malignant neoplasm of breast: Secondary | ICD-10-CM

## 2022-06-06 DIAGNOSIS — Z8249 Family history of ischemic heart disease and other diseases of the circulatory system: Secondary | ICD-10-CM

## 2022-06-06 DIAGNOSIS — R509 Fever, unspecified: Secondary | ICD-10-CM | POA: Diagnosis not present

## 2022-06-06 DIAGNOSIS — E876 Hypokalemia: Secondary | ICD-10-CM | POA: Diagnosis present

## 2022-06-06 DIAGNOSIS — Z8041 Family history of malignant neoplasm of ovary: Secondary | ICD-10-CM

## 2022-06-06 DIAGNOSIS — Z1152 Encounter for screening for COVID-19: Secondary | ICD-10-CM

## 2022-06-06 DIAGNOSIS — R945 Abnormal results of liver function studies: Secondary | ICD-10-CM | POA: Diagnosis present

## 2022-06-06 DIAGNOSIS — H01006 Unspecified blepharitis left eye, unspecified eyelid: Secondary | ICD-10-CM | POA: Insufficient documentation

## 2022-06-06 DIAGNOSIS — Z90710 Acquired absence of both cervix and uterus: Secondary | ICD-10-CM

## 2022-06-06 DIAGNOSIS — Z800 Family history of malignant neoplasm of digestive organs: Secondary | ICD-10-CM

## 2022-06-06 DIAGNOSIS — R519 Headache, unspecified: Secondary | ICD-10-CM | POA: Diagnosis not present

## 2022-06-06 DIAGNOSIS — F17210 Nicotine dependence, cigarettes, uncomplicated: Secondary | ICD-10-CM | POA: Diagnosis present

## 2022-06-06 DIAGNOSIS — Z825 Family history of asthma and other chronic lower respiratory diseases: Secondary | ICD-10-CM

## 2022-06-06 DIAGNOSIS — H01004 Unspecified blepharitis left upper eyelid: Secondary | ICD-10-CM | POA: Diagnosis present

## 2022-06-06 DIAGNOSIS — Z635 Disruption of family by separation and divorce: Secondary | ICD-10-CM

## 2022-06-06 LAB — COMPREHENSIVE METABOLIC PANEL WITH GFR
ALT: 160 U/L — ABNORMAL HIGH (ref 0–44)
AST: 107 U/L — ABNORMAL HIGH (ref 15–41)
Albumin: 3.4 g/dL — ABNORMAL LOW (ref 3.5–5.0)
Alkaline Phosphatase: 174 U/L — ABNORMAL HIGH (ref 38–126)
Anion gap: 9 (ref 5–15)
BUN: 8 mg/dL (ref 6–20)
CO2: 24 mmol/L (ref 22–32)
Calcium: 8.3 mg/dL — ABNORMAL LOW (ref 8.9–10.3)
Chloride: 102 mmol/L (ref 98–111)
Creatinine, Ser: 0.82 mg/dL (ref 0.44–1.00)
GFR, Estimated: >60 mL/min (ref >60)
Glucose, Bld: 97 mg/dL (ref 70–99)
Potassium: 3.5 mmol/L (ref 3.5–5.1)
Sodium: 135 mmol/L (ref 135–145)
Total Bilirubin: 0.6 mg/dL (ref 0.3–1.2)
Total Protein: 6.9 g/dL (ref 6.5–8.1)

## 2022-06-06 LAB — CBC WITH DIFFERENTIAL/PLATELET
Abs Immature Granulocytes: 0.2 10*3/uL — ABNORMAL HIGH (ref 0.00–0.07)
Basophils Absolute: 0.1 10*3/uL (ref 0.0–0.1)
Basophils Relative: 1 %
Eosinophils Absolute: 0.3 10*3/uL (ref 0.0–0.5)
Eosinophils Relative: 4 %
HCT: 35.3 % — ABNORMAL LOW (ref 36.0–46.0)
Hemoglobin: 11.9 g/dL — ABNORMAL LOW (ref 12.0–15.0)
Immature Granulocytes: 3 %
Lymphocytes Relative: 27 %
Lymphs Abs: 1.8 10*3/uL (ref 0.7–4.0)
MCH: 31.6 pg (ref 26.0–34.0)
MCHC: 33.7 g/dL (ref 30.0–36.0)
MCV: 93.9 fL (ref 80.0–100.0)
Monocytes Absolute: 0.5 10*3/uL (ref 0.1–1.0)
Monocytes Relative: 8 %
Neutro Abs: 3.9 10*3/uL (ref 1.7–7.7)
Neutrophils Relative %: 57 %
Platelets: 207 10*3/uL (ref 150–400)
RBC: 3.76 MIL/uL — ABNORMAL LOW (ref 3.87–5.11)
RDW: 11.6 % (ref 11.5–15.5)
Smear Review: NORMAL
WBC: 6.7 10*3/uL (ref 4.0–10.5)
nRBC: 0 % (ref 0.0–0.2)

## 2022-06-06 LAB — HSV 1/2 PCR, CSF
HSV-1 DNA: NEGATIVE
HSV-2 DNA: NEGATIVE

## 2022-06-06 LAB — URINE CULTURE: Culture: NO GROWTH

## 2022-06-06 LAB — URINALYSIS, ROUTINE W REFLEX MICROSCOPIC
Bilirubin Urine: NEGATIVE
Glucose, UA: NEGATIVE mg/dL
Hgb urine dipstick: NEGATIVE
Ketones, ur: NEGATIVE mg/dL
Leukocytes,Ua: NEGATIVE
Nitrite: NEGATIVE
Protein, ur: NEGATIVE mg/dL
Specific Gravity, Urine: 1.003 — ABNORMAL LOW (ref 1.005–1.030)
pH: 6 (ref 5.0–8.0)

## 2022-06-06 LAB — LACTIC ACID, PLASMA
Lactic Acid, Venous: 0.9 mmol/L (ref 0.5–1.9)
Lactic Acid, Venous: 1.3 mmol/L (ref 0.5–1.9)

## 2022-06-06 LAB — RESP PANEL BY RT-PCR (FLU A&B, COVID) ARPGX2
Influenza A by PCR: NEGATIVE
Influenza B by PCR: NEGATIVE
SARS Coronavirus 2 by RT PCR: NEGATIVE

## 2022-06-06 LAB — ROCKY MTN SPOTTED FVR ABS PNL(IGG+IGM)
RMSF IgG: NEGATIVE
RMSF IgM: 0.96 index — ABNORMAL HIGH (ref 0.00–0.89)

## 2022-06-06 MED ORDER — HYDROMORPHONE HCL 1 MG/ML IJ SOLN
1.0000 mg | Freq: Once | INTRAMUSCULAR | Status: AC
  Administered 2022-06-06: 1 mg via INTRAVENOUS
  Filled 2022-06-06: qty 1

## 2022-06-06 MED ORDER — SODIUM CHLORIDE 0.9 % IV SOLN
100.0000 mg | Freq: Two times a day (BID) | INTRAVENOUS | Status: AC
  Administered 2022-06-07 – 2022-06-11 (×9): 100 mg via INTRAVENOUS
  Filled 2022-06-06 (×9): qty 100

## 2022-06-06 MED ORDER — IBUPROFEN 800 MG PO TABS
800.0000 mg | ORAL_TABLET | Freq: Once | ORAL | Status: AC
  Administered 2022-06-06: 800 mg via ORAL
  Filled 2022-06-06: qty 1

## 2022-06-06 MED ORDER — ENOXAPARIN SODIUM 60 MG/0.6ML IJ SOSY
0.5000 mg/kg | PREFILLED_SYRINGE | INTRAMUSCULAR | Status: DC
  Administered 2022-06-06 – 2022-06-12 (×7): 45 mg via SUBCUTANEOUS
  Filled 2022-06-06 (×7): qty 0.6

## 2022-06-06 MED ORDER — IBUPROFEN 400 MG PO TABS
200.0000 mg | ORAL_TABLET | Freq: Four times a day (QID) | ORAL | Status: DC | PRN
  Administered 2022-06-07: 200 mg via ORAL
  Filled 2022-06-06 (×2): qty 1

## 2022-06-06 MED ORDER — SODIUM CHLORIDE 0.9 % IV BOLUS
1000.0000 mL | Freq: Once | INTRAVENOUS | Status: AC
  Administered 2022-06-06: 1000 mL via INTRAVENOUS

## 2022-06-06 MED ORDER — IOHEXOL 350 MG/ML SOLN
100.0000 mL | Freq: Once | INTRAVENOUS | Status: AC | PRN
  Administered 2022-06-06: 100 mL via INTRAVENOUS

## 2022-06-06 MED ORDER — HYDROMORPHONE HCL 1 MG/ML IJ SOLN: 1.0000 mg | Freq: Four times a day (QID) | INTRAMUSCULAR | Status: DC | PRN

## 2022-06-06 MED ORDER — GADOBUTROL 1 MMOL/ML IV SOLN
8.0000 mL | Freq: Once | INTRAVENOUS | Status: AC | PRN
  Administered 2022-06-06: 8 mL via INTRAVENOUS

## 2022-06-06 MED ORDER — FENTANYL CITRATE PF 50 MCG/ML IJ SOSY
50.0000 ug | PREFILLED_SYRINGE | Freq: Once | INTRAMUSCULAR | Status: AC
  Administered 2022-06-06: 50 ug via INTRAVENOUS
  Filled 2022-06-06: qty 1

## 2022-06-06 MED ORDER — ONDANSETRON HCL 4 MG/2ML IJ SOLN
4.0000 mg | Freq: Once | INTRAMUSCULAR | Status: AC
  Administered 2022-06-06: 4 mg via INTRAVENOUS
  Filled 2022-06-06: qty 2

## 2022-06-06 MED ORDER — ONDANSETRON HCL 4 MG/2ML IJ SOLN
4.0000 mg | Freq: Four times a day (QID) | INTRAMUSCULAR | Status: DC | PRN
  Administered 2022-06-09: 4 mg via INTRAVENOUS
  Filled 2022-06-06: qty 2

## 2022-06-06 MED ORDER — ONDANSETRON HCL 4 MG PO TABS
4.0000 mg | ORAL_TABLET | Freq: Four times a day (QID) | ORAL | Status: DC | PRN
  Administered 2022-06-07: 4 mg via ORAL
  Filled 2022-06-06: qty 1

## 2022-06-06 NOTE — ED Provider Notes (Signed)
[facility] Provider Note    Event Date/Time   First MD Initiated Contact with Patient 06/06/22 1313     (approximate)   History   Chief Complaint No chief complaint on file.   HPI [name] is a 47 y.o. female, history of depression, IBS, presents to the emergency department for evaluation of fever.  She was seen here initially for headache and fever on 06/03/2022 due to possible concern for meningitis.  After workup, the general consensus was that she had a viral meningitis.  She received oxycodone and Flexeril advised to monitor her symptoms, discharged the morning of 06/04/2022.  Over the past couple days, she has had persistent headache and fever with no relief in her symptoms.  She says that her fever this morning was 103 F.  She is not getting any relief with the pain medication given.  Denies chest pain, shortness of breath, abdominal pain, flank pain, nausea/vomiting, diarrhea, urinary symptoms, vision changes, hearing changes, numbness/tingling in upper or lower extremities, weakness, or vertigo.  History Limitations: No limitations.        Physical Exam  Triage Vital Signs: ED Triage Vitals  Enc Vitals Group     BP 06/06/22 1254 (!) 139/93     Pulse Rate 06/06/22 1256 89     Resp 06/06/22 1254 16     Temp 06/06/22 1254 98.7 F (37.1 C)     Temp Source 06/06/22 1256 Oral     SpO2 06/06/22 1254 94 %     Weight 06/06/22 1255 196 lb (88.9 kg)     Height 06/06/22 1255 5\' 6"  (1.676 m)     Head Circumference --      Peak Flow --      Pain Score 06/06/22 1255 10     Pain Loc --      Pain Edu? --      Excl. in [place]? --     Most recent vital signs: Vitals:   06/06/22 1700 06/06/22 1730  BP: 137/89 139/84  Pulse: 75 66  Resp:  16  Temp:    SpO2: 98% 99%    General: Awake, appears quite uncomfortable. Skin: Warm, dry. No rashes or lesions.  Eyes: PERRL. Conjunctivae normal.  CV: Good peripheral perfusion.  Resp: Normal effort.   Abd: Soft, non-tender. No distention.  Neuro: At baseline. No gross neurological deficits.  Musculoskeletal: Normal ROM of all extremities.  Focused Exam: Normal range of motion of the head/neck.  No nuchal rigidity.  Physical Exam    ED Results / Procedures / Treatments  Labs (all labs ordered are listed, but only abnormal results are displayed) Labs Reviewed  CBC WITH DIFFERENTIAL/PLATELET - Abnormal; Notable for the following components:      Result Value   RBC 3.76 (*)    Hemoglobin 11.9 (*)    HCT 35.3 (*)    Abs Immature Granulocytes 0.20 (*)    All other components within normal limits  COMPREHENSIVE METABOLIC PANEL - Abnormal; Notable for the following components:   Calcium 8.3 (*)    Albumin 3.4 (*)    AST 107 (*)    ALT 160 (*)    Alkaline Phosphatase 174 (*)    All other components within normal limits  URINALYSIS, ROUTINE W REFLEX MICROSCOPIC - Abnormal; Notable for the following components:   Color, Urine YELLOW (*)    APPearance CLOUDY (*)    Specific Gravity, Urine 1.003 (*)    All other components within normal  limits  RESP PANEL BY RT-PCR (FLU A&B, COVID) ARPGX2  LACTIC ACID, PLASMA  LACTIC ACID, PLASMA     EKG N/A.    RADIOLOGY  ED Provider Interpretation: Pending  No results found.  PROCEDURES:  Critical Care performed: N/A.  Procedures    MEDICATIONS ORDERED IN ED: Medications  HYDROmorphone (DILAUDID) injection 1 mg (has no administration in time range)  sodium chloride 0.9 % bolus 1,000 mL (0 mLs Intravenous Stopped 06/06/22 1607)  HYDROmorphone (DILAUDID) injection 1 mg (1 mg Intravenous Given 06/06/22 1411)  ondansetron (ZOFRAN) injection 4 mg (4 mg Intravenous Given 06/06/22 1412)  fentaNYL (SUBLIMAZE) injection 50 mcg (50 mcg Intravenous Given 06/06/22 1537)  gadobutrol (GADAVIST) 1 MMOL/ML injection 8 mL (8 mLs Intravenous Contrast Given 06/06/22 1829)     IMPRESSION / MDM / ASSESSMENT AND PLAN / ED COURSE  I reviewed the triage  vital signs and the nursing notes.                              Differential diagnosis includes, but is not limited to, viral meningitis, bacterial meningitis, intracranial hemorrhage, encephalitis, sinusitis, central venous thrombosis, migraine, tension headache, COVID-19, influenza,  ED Course Patient appears clinically stable, but very uncomfortable and in pain.  Will initiate IV fluids and hydromorphone.  CBC shows no cytosis.  Mild anemia present with hemoglobin of 11.9.  No thrombocytopenia.  CMP shows elevated LFTs, consistent with previous visits.  No remarkable changes.  No electrolyte abnormalities.  No AKI.  Urinalysis shows no evidence of infection at this time.  Lactic acid unremarkable.  Assessment/Plan Patient presents with fever and headache x 1 week.  On records review from her encounter on 06/04/2022, they did a thorough workup for meningitis.  Results from LP were fairly unremarkable.  Still pending results for [facility] spotted fever, though lab workup appears reassuring with the exception of a stable transaminitis that I suspect is most likely from a viral etiology. Nonetheless, the patient continues to have persistent severe headaches that has been refractory to opiate medications.  Prior CT noncontrast study was negative.  Given the patient's persistent symptoms, will order MRI/MRV for further evaluation.  Pending results at this time.  Patient care transferred over to Rachelle Hora, PA-C at [phone].   Patient's presentation is most consistent with acute illness / injury with system symptoms.       FINAL CLINICAL IMPRESSION(S) / ED DIAGNOSES   Final diagnoses:  None     Rx / DC Orders   ED Discharge Orders     None        Note:  This document was prepared using Dragon voice recognition software and may include unintentional dictation errors.   Teodoro Spray, [place] 06/06/22 Lattie Corns    Delman Kitten, MD 06/06/22 2012

## 2022-06-06 NOTE — ED Notes (Signed)
Pt resting comfortably in bed. Beverage provided at her request. Pt asked for more pain meds. Will make PA aware.

## 2022-06-06 NOTE — ED Provider Notes (Signed)
Medical screening examination/treatment/procedure(s) were conducted as a shared visit with non-physician practitioner(s) and myself.  I personally evaluated the patient during the encounter.    Patient continues to have fevers headache that is somewhat intractable.  She had an extensive workup that did not reveal an obvious meningitis or encephalitis on previous workup.  Today, I do not think repeat lumbar puncture is necessitated, but I do agree with MRI MRV to evaluate for acute finding rule out venous sinus thrombosis etc. would be helpful.  She is fully alert oriented without obvious painful distress afebrile here now.  Also given the intractable nature of her headache, anticipate she will require admission and neurology consultation for further evaluation.  The patient is understanding and agreeable with this plan  I reviewed her previous CSF findings, her culture is thus far negative to date.  Some tests are pending such as HSV and [facility] spotted fever.   Delman Kitten, MD 06/06/22 [phone]

## 2022-06-06 NOTE — Progress Notes (Signed)
Anticoagulation monitoring(Lovenox):  47 yo female ordered Lovenox 40 mg Q24h    Filed Weights   06/06/22 1255  Weight: 88.9 kg (196 lb)   BMI 31.6   Lab Results  Component Value Date   CREATININE 0.82 06/06/2022   CREATININE 0.74 06/04/2022   CREATININE 0.72 06/04/2022   Estimated Creatinine Clearance: 98.3 mL/min (by C-G formula based on SCr of 0.82 mg/dL). Hemoglobin & Hematocrit     Component Value Date/Time   HGB 11.9 (L) 06/06/2022 1334   HGB 14.6 09/03/2021 1547   HCT 35.3 (L) 06/06/2022 1334   HCT 41.6 09/03/2021 1547     Per Protocol for Patient with estCrcl > 30 ml/min and BMI > 30, will transition to Lovenox 45 mg Q24h.

## 2022-06-06 NOTE — Telephone Encounter (Signed)
Spoke with pt - states she was able to get fever to come down with w/ cold shower and she is going to go to ED.  She is waiting on her boyfriend to come get her as she isn't able to drive.

## 2022-06-06 NOTE — ED Notes (Signed)
Teleneuro at bedside at request of doc.

## 2022-06-06 NOTE — Telephone Encounter (Signed)
  Chief Complaint: fever 103.6  Symptoms: fever, severe headache, dry mouth extreme thirst, able to urinate this am dark in color. Drinking fluids , "liquid IV". Chills better under a thick blanket. Standing worsening headache Frequency: since last week seen in ED Tuesday per patient  Pertinent Negatives: Patient denies chest pain no difficulty breathing  Disposition: [x] ED /[] Urgent Care (no appt availability in office) / [] Appointment(In office/virtual)/ []  [facility]/ [] Home Care/ [] Refused Recommended Disposition /[] [facility]/ []  Follow-up with PCP Additional Notes:   Recommended patient go back to ED for possible dehydration and fever. Taking ibuprofen . Was told at ED Tuesday not to take tylenol due to elevated liver enzymes. Please advise patient does not want to go back to ED and sit for hours again and be sent home. Requesting call back from PCP.     Reason for Disposition  Severe chills (i.e., feeling extremely cold WITH shaking chills)  Answer Assessment - Initial Assessment Questions 1. TEMPERATURE: "What is the most recent temperature?"  "How was it measured?"      Oral temp 103.6 2. ONSET: "When did the fever start?"      Last week  3. CHILLS: "Do you have chills?" If yes: "How bad are they?"  (e.g., none, mild, moderate, severe)   - NONE: no chills   - MILD: feeling cold   - MODERATE: feeling very cold, some shivering (feels better under a thick blanket)   - SEVERE: feeling extremely cold with shaking chills (general body shaking, rigors; even under a thick blanket)      Moderate "freezing" under a thick blanket and feels better 4. OTHER SYMPTOMS: "Do you have any other symptoms besides the fever?"  (e.g., abdomen pain, cough, diarrhea, earache, headache, sore throat, urination pain)     Headache  fever dry mouth urinated this am dark in color severe headache when standing  5. CAUSE: If there are no symptoms, ask: "What do you think is causing the  fever?"      Not sure  6. CONTACTS: "Does anyone else in the family have an infection?"     na 7. TREATMENT: "What have you done so far to treat this fever?" (e.g., medications)     Ibuprofen  8. IMMUNOCOMPROMISE: "Do you have of the following: diabetes, HIV positive, splenectomy, cancer chemotherapy, chronic steroid treatment, transplant patient, etc."     na 9. PREGNANCY: "Is there any chance you are pregnant?" "When was your last menstrual period?"     na 10. TRAVEL: "Have you traveled out of the country in the last month?" (e.g., travel history, exposures)       na  Protocols used: [facility]

## 2022-06-06 NOTE — H&P (Signed)
History and Physical    Patient: [name] WYS:[id number] DOB: Aug 18, 1975 DOA: 06/06/2022 DOS: the patient was seen and examined on 06/06/2022 PCP: Gwyneth Sprout, FNP  Patient coming from: Home  Chief Complaint: No chief complaint on file. FEVER, HEADACHE HPI: [name] is a 47 y.o. female with medical history significant of IBS, abnormal liver function, presents with c/o not feeling well since 05/28/2022.  Pt notes her headache started on that date.  Pt describes the headache has occipital initially but now points to her front of head and sides.  Pt states that the headache is fairly severe.  Pt denies photophobia, nuchal rigidity n/v.  Pt has had headaches in the past but this is different. Pt notes that her fever started 8 days ago.  > 101.5 .  Pt has had slight dry cough.  Slight evanescant rash on the abdomen,  pt notes occasional back pain without sciatica.  Pt does smoke  and drink,.  Pt denies any cp, palp, sob, abd pain, diarrhea, constipation, brbpr, black stool, dysuria, hematuria, arthritis.  Pt denies any recent travel.  Pt denies leg swelling.  Pt denies tick bite.    In ED, T 97.9, P 75  R 14, Bp 141/53  pox 96% on RA  06/04/2022 Covid -19 negative Influenza negative Wbc 7.2, Hgb 13.0, Plt 218 Na 134, K 3.8, Bun 9, Creat 0.72 Ast 140, Alt 163, Alk phos 166, T. Bili 0.5 Urinalysis negative HIV negative [facility] spotted fever IgG negative, IgM 0.96 (0-0.89) Blood culture x2 NGTD  CSF  wbc 5, rbc 2,  (85% lymph, 15% mono) Wbc 2, rbc 2 Prot 27, glucose 51 HSV pcr negative Hepatitis panel negative 06/06/22  06/06/22 MRI brain/ MRV FINDINGS: MRI HEAD FINDINGS   Brain: No acute infarction, hemorrhage, hydrocephalus, extra-axial collection or mass lesion. No pathologic intracranial enhancement. Focal contrast-enhancing lesion.   Vascular: Normal flow voids.   Skull and upper cervical spine: Normal marrow signal.   Sinuses/Orbits: Polypoid mucosal  thickening right maxillary sinus. Trace right mastoid effusion.   Other: None   MRV HEAD FINDINGS   There is no evidence of dural venous sinus or deep cerebral vein thrombosis. No dural venous sinus stenosis.   IMPRESSION: 1. No acute intracranial abnormality. 2. No evidence of dural venous sinus thrombosis.   Pt will be admitted for fever, headache possibly secondary to [facility] Spotted Fever, and doxycycline initiated  Review of Systems: negative for all 10 organ systems except for + above Past Medical History:  Diagnosis Date   BRCA negative 07/2018   MyRisk neg   Family history of breast cancer    IBIS=13%/riskscore=8.4%   Family history of ovarian cancer    Family history of pancreatic cancer    History of frequent urinary tract infections    Hypoglycemia    IBS (irritable bowel syndrome)    Past Surgical History:  Procedure Laterality Date   ABDOMINAL HYSTERECTOMY  07/2006   PARTIAL   BACK SURGERY     CHOLECYSTECTOMY     DIAGNOSTIC LAPAROSCOPY     test on bladder   GALLBLADDER SURGERY     LAPAROSCOPIC APPENDECTOMY N/A 12/08/2016   Procedure: APPENDECTOMY LAPAROSCOPIC;  Surgeon: Jules Husbands, MD;  Location: [facility] ORS;  Service: General;  Laterality: N/A;   MAXIMUM ACCESS (MAS)POSTERIOR LUMBAR INTERBODY FUSION (PLIF) 1 LEVEL N/A 05/06/2013   Procedure: LUMBAR FIVE-SACRAL ONE MAXIMUM ACCESS POSTERIOR LUMBAR INTERBODY FUSION;  Surgeon: Eustace Moore, MD;  Location: [facility];  Service: Neurosurgery;  Laterality: N/A;   TOE SURGERY     TUBAL LIGATION     WISDOM TOOTH EXTRACTION     Social History:  reports that she has been smoking cigarettes. She has a 10.00 pack-year smoking history. She has never used smokeless tobacco. She reports current alcohol use of about 40.0 standard drinks of alcohol per week. She reports that she does not use drugs.  Allergies  Allergen Reactions   Codeine Nausea And Vomiting   Sulfa Antibiotics Rash   Vicodin  [Hydrocodone-Acetaminophen] Nausea Only    And itching all over    Family History  Problem Relation Age of Onset   Hypertension Mother    COPD Mother    Epilepsy Mother    Sleep apnea Mother    Hypertension Father    Sleep apnea Father    Breast cancer Maternal Aunt 55   Ovarian cancer Maternal Aunt 45   Colon cancer Paternal Uncle 30   Pancreatic cancer Maternal Grandmother 56   Colon cancer Maternal Grandfather 63    Prior to Admission medications   Medication Sig Start Date End Date Taking? Authorizing Provider  cyclobenzaprine (FLEXERIL) 10 MG tablet Take 1 tablet (10 mg total) by mouth 3 (three) times daily as needed for muscle spasms (neck pain/spasms). 06/04/22   Duffy Bruce, MD  FLUoxetine (PROZAC) 40 MG capsule Take 1 capsule (40 mg total) by mouth daily. 05/09/22   Gwyneth Sprout, FNP  hydrOXYzine (VISTARIL) 25 MG capsule TAKE 1 CAPSULE BY MOUTH EVERY 8 HOURS AS NEEDED 04/26/22   Ostwalt, Letitia Libra, PA-C  oxyCODONE (ROXICODONE) 5 MG immediate release tablet Take 1 tablet (5 mg total) by mouth every 6 (six) hours as needed for severe pain. 06/04/22 06/04/23  Duffy Bruce, MD  traZODone (DESYREL) 100 MG tablet Take 1-2 tablets PO as needed to assist with sleep 04/03/22   Gwyneth Sprout, FNP  valACYclovir (VALTREX) 500 MG tablet Take 2 tablets (1,000 mg total) by mouth 2 (two) times daily. 03/18/22   Gwyneth Sprout, FNP    Physical Exam: Vitals:   06/06/22 2030 06/06/22 2100 06/06/22 2130 06/06/22 2200  BP: (!) 147/82 128/81 129/82 130/71  Pulse: 93 85 89 90  Resp:  16  14  Temp:      TempSrc:      SpO2: 90% 93% 97% 98%  Weight:      Height:       Heent: anicteric, pupils 1.25m symmetric, direct, consensual, near intact, eomi No conjunctival hemorrhage Neck: no jvd, no bruit, no tm, no adenopathy Heart: rrr s1, s2, no m/g/r Lung: CTAB Abd: soft, obese, nt, nd, +bs, negative murphy, no rash Lymph: no cervical, no supraclavicular adenopathy Neuro: cn 2-12 intact,  reflexes 2+ symmettric, diffuse with no clonus, motor 5/5 in all 4 ext Skin: no janeway, no osler, no splinter, no palmar erythema, no asterixis No palmar rash No  visible rash  Data Reviewed: 06/06/2022   Wbc 6.7, hgb 11.9, Plt 232 Na 135, K 3.5, Bun 8, Creat 0.82 Ast 107, Alt 160, Alk phos 174, T. Bili 0.6 Alb 3.4, Calcium 8.3,  Urinalysis negative  Lactic acid negative    Assessment and Plan: Fever, Headache ? RMSF Start Doxycycline 1026miv bid  Headache Check ESR, Crp If ESR elevated >100 or significantly high, please initiate prednisone 6068mo qday for possibly temporal arteritis  Fever of unclear origin possibly RMSF Doxycycline as above Check ASO, Monospot, EBV titers, CMV, Brucella antibody, parvovirus B19,  ANA, RF, ANCA Check CTA chest r/o PE, r/o occult infection, check CT abd/ pelvis r/o occult infection Await final blood culture results Check Cardiac echo r/o endocarditis Check MRI lumbar spine due to c/o back pain and fever r/o epidural abscess  Abnormal liver function Check ferritin, iron, tibc, ceruloplasmin, alpha 1 antitrypsin Check ANA, anti smooth muscle ab, AMA Check Celiac panel Check hepatitis C RNA Check cmp in am  DVT prophylaxis: Lovenox FULL CODE Dispo: home  Pt will be admitted observation < 2 nite stay, may require change to inpatient admission depending upon results and fever curve.    Advance Care Planning:   Code Status: Prior FULL CODE  Consults: asked ER to consult neurology but don't think they did  Family Communication: w husband  Severity of Illness: The appropriate patient status for this patient is OBSERVATION. Observation status is judged to be reasonable and necessary in order to provide the required intensity of service to ensure the patient's safety. The patient's presenting symptoms, physical exam findings, and initial radiographic and laboratory data in the context of their medical condition is felt to place them at  decreased risk for further clinical deterioration. Furthermore, it is anticipated that the patient will be medically stable for discharge from the hospital within 2 midnights of admission.   Author: Jani Gravel, MD 06/06/2022 10:52 PM  For on call review [email or web address].

## 2022-06-06 NOTE — ED Provider Notes (Signed)
Paged hospitalist to discuss admission for intractable headache.  Hospitalist requested neuro consult.  Neuro consult pending. Patient noted to have temp of 102.7, ibuprofen given p.o.   Renata Caprice 06/06/22 2122    Delman Kitten, MD 06/17/22 1023

## 2022-06-06 NOTE — ED Triage Notes (Signed)
Pt in with fever 103.6; stated told Tuesday was on "tail-end of viral meningitis". Severe HA.

## 2022-06-06 NOTE — ED Notes (Signed)
Pt states took 4 ibuprofen; states no tylenol as told liver enzymes high.

## 2022-06-06 NOTE — ED Notes (Signed)
Stat Neuro consult paged out per Ages, [place]

## 2022-06-06 NOTE — Telephone Encounter (Signed)
Please advise 

## 2022-06-07 ENCOUNTER — Inpatient Hospital Stay: Payer: 59

## 2022-06-07 DIAGNOSIS — Z635 Disruption of family by separation and divorce: Secondary | ICD-10-CM | POA: Diagnosis not present

## 2022-06-07 DIAGNOSIS — Z8041 Family history of malignant neoplasm of ovary: Secondary | ICD-10-CM | POA: Diagnosis not present

## 2022-06-07 DIAGNOSIS — F17210 Nicotine dependence, cigarettes, uncomplicated: Secondary | ICD-10-CM | POA: Diagnosis present

## 2022-06-07 DIAGNOSIS — Z820 Family history of epilepsy and other diseases of the nervous system: Secondary | ICD-10-CM | POA: Diagnosis not present

## 2022-06-07 DIAGNOSIS — Z825 Family history of asthma and other chronic lower respiratory diseases: Secondary | ICD-10-CM | POA: Diagnosis not present

## 2022-06-07 DIAGNOSIS — Z8249 Family history of ischemic heart disease and other diseases of the circulatory system: Secondary | ICD-10-CM | POA: Diagnosis not present

## 2022-06-07 DIAGNOSIS — J101 Influenza due to other identified influenza virus with other respiratory manifestations: Secondary | ICD-10-CM | POA: Diagnosis present

## 2022-06-07 DIAGNOSIS — H01004 Unspecified blepharitis left upper eyelid: Secondary | ICD-10-CM | POA: Diagnosis present

## 2022-06-07 DIAGNOSIS — Z90710 Acquired absence of both cervix and uterus: Secondary | ICD-10-CM | POA: Diagnosis not present

## 2022-06-07 DIAGNOSIS — E876 Hypokalemia: Secondary | ICD-10-CM | POA: Diagnosis present

## 2022-06-07 DIAGNOSIS — Z6832 Body mass index (BMI) 32.0-32.9, adult: Secondary | ICD-10-CM | POA: Diagnosis not present

## 2022-06-07 DIAGNOSIS — B259 Cytomegaloviral disease, unspecified: Secondary | ICD-10-CM | POA: Diagnosis present

## 2022-06-07 DIAGNOSIS — Z800 Family history of malignant neoplasm of digestive organs: Secondary | ICD-10-CM | POA: Diagnosis not present

## 2022-06-07 DIAGNOSIS — R945 Abnormal results of liver function studies: Secondary | ICD-10-CM | POA: Diagnosis not present

## 2022-06-07 DIAGNOSIS — R519 Headache, unspecified: Secondary | ICD-10-CM | POA: Diagnosis not present

## 2022-06-07 DIAGNOSIS — R509 Fever, unspecified: Secondary | ICD-10-CM | POA: Diagnosis not present

## 2022-06-07 DIAGNOSIS — E669 Obesity, unspecified: Secondary | ICD-10-CM | POA: Diagnosis present

## 2022-06-07 DIAGNOSIS — Z1152 Encounter for screening for COVID-19: Secondary | ICD-10-CM | POA: Diagnosis not present

## 2022-06-07 DIAGNOSIS — Z803 Family history of malignant neoplasm of breast: Secondary | ICD-10-CM | POA: Diagnosis not present

## 2022-06-07 LAB — SEDIMENTATION RATE: Sed Rate: 34 mm/hr — ABNORMAL HIGH (ref 0–20)

## 2022-06-07 LAB — CSF CULTURE W GRAM STAIN
Culture: NO GROWTH
Special Requests: NORMAL

## 2022-06-07 LAB — TSH: TSH: 2.437 u[IU]/mL (ref 0.350–4.500)

## 2022-06-07 LAB — COMPREHENSIVE METABOLIC PANEL
ALT: 308 U/L — ABNORMAL HIGH (ref 0–44)
AST: 346 U/L — ABNORMAL HIGH (ref 15–41)
Albumin: 2.8 g/dL — ABNORMAL LOW (ref 3.5–5.0)
Alkaline Phosphatase: 263 U/L — ABNORMAL HIGH (ref 38–126)
Anion gap: 6 (ref 5–15)
BUN: 7 mg/dL (ref 6–20)
CO2: 22 mmol/L (ref 22–32)
Calcium: 7.6 mg/dL — ABNORMAL LOW (ref 8.9–10.3)
Chloride: 106 mmol/L (ref 98–111)
Creatinine, Ser: 0.68 mg/dL (ref 0.44–1.00)
GFR, Estimated: >60 mL/min (ref >60)
Glucose, Bld: 140 mg/dL — ABNORMAL HIGH (ref 70–99)
Potassium: 3.1 mmol/L — ABNORMAL LOW (ref 3.5–5.1)
Sodium: 134 mmol/L — ABNORMAL LOW (ref 135–145)
Total Bilirubin: 1.1 mg/dL (ref 0.3–1.2)
Total Protein: 5.8 g/dL — ABNORMAL LOW (ref 6.5–8.1)

## 2022-06-07 LAB — RESPIRATORY PANEL BY PCR

## 2022-06-07 LAB — C-REACTIVE PROTEIN: CRP: 5.9 mg/dL — ABNORMAL HIGH (ref <1.0)

## 2022-06-07 LAB — IRON AND TIBC
Iron: 31 ug/dL (ref 28–170)
Saturation Ratios: 9 % — ABNORMAL LOW (ref 10.4–31.8)
TIBC: 340 ug/dL (ref 250–450)
UIBC: 309 ug/dL

## 2022-06-07 LAB — CBC
HCT: 32.3 % — ABNORMAL LOW (ref 36.0–46.0)
Hemoglobin: 10.6 g/dL — ABNORMAL LOW (ref 12.0–15.0)
MCH: 31.1 pg (ref 26.0–34.0)
MCHC: 32.8 g/dL (ref 30.0–36.0)
MCV: 94.7 fL (ref 80.0–100.0)
Platelets: 183 10*3/uL (ref 150–400)
RBC: 3.41 MIL/uL — ABNORMAL LOW (ref 3.87–5.11)
RDW: 11.7 % (ref 11.5–15.5)
WBC: 4 10*3/uL (ref 4.0–10.5)
nRBC: 0 % (ref 0.0–0.2)

## 2022-06-07 LAB — FERRITIN: Ferritin: 270 ng/mL (ref 11–307)

## 2022-06-07 LAB — CORTISOL: Cortisol, Plasma: 3.3 ug/dL

## 2022-06-07 LAB — CK: Total CK: 40 U/L (ref 38–234)

## 2022-06-07 LAB — MONONUCLEOSIS SCREEN: Mono Screen: NEGATIVE

## 2022-06-07 MED ORDER — GADOBUTROL 1 MMOL/ML IV SOLN
9.0000 mL | Freq: Once | INTRAVENOUS | Status: AC | PRN
  Administered 2022-06-07: 9 mL via INTRAVENOUS

## 2022-06-07 MED ORDER — BUTALBITAL-APAP-CAFFEINE 50-325-40 MG PO TABS
2.0000 | ORAL_TABLET | Freq: Four times a day (QID) | ORAL | Status: DC | PRN
  Administered 2022-06-07: 2 via ORAL
  Filled 2022-06-07: qty 2

## 2022-06-07 MED ORDER — IBUPROFEN 400 MG PO TABS
400.0000 mg | ORAL_TABLET | Freq: Four times a day (QID) | ORAL | Status: DC | PRN
  Administered 2022-06-07 – 2022-06-08 (×2): 400 mg via ORAL
  Filled 2022-06-07 (×2): qty 1

## 2022-06-07 MED ORDER — SODIUM CHLORIDE 0.9 % IV SOLN
12.5000 mg | Freq: Once | INTRAVENOUS | Status: AC
  Administered 2022-06-07: 12.5 mg via INTRAVENOUS
  Filled 2022-06-07: qty 12.5

## 2022-06-07 MED ORDER — PROCHLORPERAZINE EDISYLATE 10 MG/2ML IJ SOLN
10.0000 mg | Freq: Once | INTRAMUSCULAR | Status: AC
  Administered 2022-06-07: 10 mg via INTRAVENOUS
  Filled 2022-06-07: qty 2

## 2022-06-07 MED ORDER — SODIUM CHLORIDE 0.9 % IV SOLN: INTRAVENOUS | Status: DC

## 2022-06-07 MED ORDER — DIPHENHYDRAMINE HCL 50 MG/ML IJ SOLN
25.0000 mg | Freq: Four times a day (QID) | INTRAMUSCULAR | Status: DC | PRN
  Administered 2022-06-07: 25 mg via INTRAVENOUS

## 2022-06-07 MED ORDER — DIPHENHYDRAMINE HCL 50 MG/ML IJ SOLN
25.0000 mg | Freq: Once | INTRAMUSCULAR | Status: AC
  Filled 2022-06-07: qty 1

## 2022-06-07 MED ORDER — DIPHENHYDRAMINE HCL 50 MG/ML IJ SOLN
INTRAMUSCULAR | Status: AC
  Filled 2022-06-07: qty 1

## 2022-06-07 MED ORDER — POTASSIUM CHLORIDE CRYS ER 20 MEQ PO TBCR
40.0000 meq | EXTENDED_RELEASE_TABLET | Freq: Once | ORAL | Status: AC
  Administered 2022-06-07: 40 meq via ORAL
  Filled 2022-06-07: qty 2

## 2022-06-07 MED ORDER — DIPHENHYDRAMINE HCL 50 MG/ML IJ SOLN
25.0000 mg | Freq: Once | INTRAMUSCULAR | Status: AC
  Administered 2022-06-07: 25 mg via INTRAVENOUS
  Filled 2022-06-07: qty 1

## 2022-06-07 MED ORDER — FLUOXETINE HCL 20 MG PO CAPS
40.0000 mg | ORAL_CAPSULE | Freq: Every day | ORAL | Status: DC
  Administered 2022-06-07 – 2022-06-13 (×7): 40 mg via ORAL
  Filled 2022-06-07 (×7): qty 2

## 2022-06-07 MED ORDER — ACETAMINOPHEN 500 MG PO TABS
1000.0000 mg | ORAL_TABLET | Freq: Three times a day (TID) | ORAL | Status: DC | PRN
  Administered 2022-06-09: 1000 mg via ORAL
  Filled 2022-06-07 (×2): qty 2

## 2022-06-07 MED ORDER — TRAZODONE HCL 100 MG PO TABS
100.0000 mg | ORAL_TABLET | Freq: Every evening | ORAL | Status: DC | PRN
  Administered 2022-06-07 – 2022-06-12 (×6): 200 mg via ORAL
  Filled 2022-06-07 (×8): qty 2

## 2022-06-07 MED ORDER — VALPROATE SODIUM 100 MG/ML IV SOLN
750.0000 mg | Freq: Three times a day (TID) | INTRAVENOUS | Status: DC | PRN
  Administered 2022-06-07 – 2022-06-08 (×2): 750 mg via INTRAVENOUS
  Filled 2022-06-07 (×2): qty 7.5

## 2022-06-07 NOTE — ED Notes (Signed)
Assumed care from Kelly,RN. Pt resting comfortably in bed at this time. Pt denies any current needs or questions. Call light with in reach.   

## 2022-06-07 NOTE — ED Notes (Signed)
Report given to Alexys, RN  

## 2022-06-07 NOTE — ED Notes (Signed)
Beverly from [facility] called and said the room is ready even though it is still showing dirty

## 2022-06-07 NOTE — ED Notes (Signed)
Pt returned from MRI °

## 2022-06-07 NOTE — ED Notes (Signed)
Called to give report with no answer.

## 2022-06-07 NOTE — ED Notes (Signed)
Retail banker for transport to room.

## 2022-06-07 NOTE — ED Notes (Signed)
Pt transported to MRI at this time 

## 2022-06-07 NOTE — ED Notes (Signed)
After discussion with Hassan Rowan, NP decision was made to give PRN Depacon for pt's persistent HA. Messaged pharmacy for dose at this time.

## 2022-06-07 NOTE — ED Notes (Signed)
Request made for transport to the floor ?

## 2022-06-07 NOTE — Consult Note (Signed)
[facility] TeleNeurology Consult Services  Stat Consult  Patient Name:   [name] Date of Birth:   12-Dec-1975 Identification Number:   MRN - [id number] Date of Service:   06/06/2022 20:44:55  Diagnosis:       G44.201 - Headache (if no chronic)  Impression 47 year old female with subacute persistent headaches, recurrent fevers, elevated liver enzymes, and equivocal range RMSF IgM titer, without otherwise negative neurologic workup including MRI and LP/CSF studies, etiology uncertain. High concern for [facility] Spotted Fever in view of fever of unknown origin and borderline labs. Ddx includes RMSF vs other rickettsial infection vs post-LP/CSF leak headache vs less likely but plausibly viral meningitis with negative CSF (less likely given timing of symptoms) +/- possible medication overuse componenet.   Recommendations: Our recommendations are outlined below.  Nursing Recommendations : Neuro checks q6 hrsWhen possible avoid opioid pain medications as this can lead to worsening headaches with rebound phenomenon  Disposition : Neurology will follow Additional Recommendations: Recommend phenergan 25 mg + Benadryl 25 mg IV Q8hours as needed for headache; in not adequate, consider MgSO4 1 gram IVPB over 20 minutes and/or Depacon 750 mg IV Q8H. Recommend ID consult. May also consider repeat LP and/or epidural blood patch. Avoid opioid medications as well as Fioricet given high risk for rebound and medication-overuse headache.   ----------------------------------------------------------------------------------------------------    Metrics: [facility] Notification Time: 06/06/2022 20:42:57 Stamp Time: 06/06/2022 20:44:55 Callback Response Time: 06/06/2022 20:46:54  Primary Provider Notified of Diagnostic Impression and Management Plan on: 06/07/2022 01:20:00   CT HEAD: Reviewed No acute abnormality.   Imaging Reviewed. MRI brain without and  with contrast, MRV head, and CT all within normal limits.  Labs RMSF IgM 0.96 (equivocal 0.90 - 1.10) CSF WBC 5 (0 - 5) AST 107, ALT 160, ALP 174 Other CSF labs and other infectious labs negative.   ----------------------------------------------------------------------------------------------------  Chief Complaint: fever and persistent headache  History of Present Illness: Patient is a 47 year old Female. Patient is a 47 year old female with history of migraine who presenting with persistent headache and recurrent fever x 9 days. Patient was seen earlier this week for such, had extensive workup including CT and LP that were essentially unremarkable. Patient states headache is positional, improved with laying down and worse upright, however this has been similar since the start of the headache and does not appear changed since the LP. She denies any rashes or seizures; she denies any tick bites or spending lots of time doing outdoor/nature activities. She denies any confusion, vision changes, weakness, numbness, aphasia or dysarthria. Patient states this is much different from her migraines. Originally started more posterior radiating to the vertex, now more bifrontal location. Patient also has been taking multiple doses of over the counter pain medications daily. Temp 102.7 F.   Medications:  No Anticoagulant use  No Antiplatelet use Reviewed EMR for current medications  Allergies:  Reviewed  Social History: Smoking: No  Family History:  There is no family history of premature cerebrovascular disease pertinent to this consultation  ROS : 14 Points Review of Systems was performed and was negative except mentioned in HPI.  Past Surgical History: There Is No Surgical History Contributory To Today's Visit   Examination: BP(128/81), Pulse(85), Blood Glucose(140) 1A: Level of Consciousness - Alert; keenly responsive + 0 1B: Ask Month and Age - Both Questions Right + 0 1C:  Blink Eyes & Squeeze Hands - Performs Both Tasks + 0 2: Test Horizontal Extraocular Movements - Normal +  0 3: Test Visual Fields - No Visual Loss + 0 4: Test Facial Palsy (Use Grimace if Obtunded) - Normal symmetry + 0 5A: Test Left Arm Motor Drift - No Drift for 10 Seconds + 0 5B: Test Right Arm Motor Drift - No Drift for 10 Seconds + 0 6A: Test Left Leg Motor Drift - No Drift for 5 Seconds + 0 6B: Test Right Leg Motor Drift - No Drift for 5 Seconds + 0 7: Test Limb Ataxia (FNF/Heel-Shin) - No Ataxia + 0 8: Test Sensation - Normal; No sensory loss + 0 9: Test Language/Aphasia - Normal; No aphasia + 0 10: Test Dysarthria - Normal + 0 11: Test Extinction/Inattention - No abnormality + 0  NIHSS Score: 0  Spoke with : Dr. Maudie Mercury    Patient / Family was informed the Neurology Consult would occur via TeleHealth consult by way of interactive audio and video telecommunications and consented to receiving care in this manner.  Patient is being evaluated for possible acute neurologic impairment and high probability of imminent or life - threatening deterioration.I spent total of 35 minutes providing care to this patient, including time for face to face visit via telemedicine, review of medical records, imaging studies and discussion of findings with providers, the patient and / or family.   Dr Heron Sabins   [facility] For Inpatient follow-up with [facility] physician please call [facility] [phone]. This is not an outpatient service. Post hospital discharge, please contact hospital directly.  Please do not communicate with [facility] physicians via secure chat. If you have any questions, Please contact [facility].

## 2022-06-07 NOTE — Progress Notes (Signed)
  PROGRESS NOTE    [name]  TFT:[id number] DOB: 1976-02-12 DOA: 06/06/2022 PCP: Gwyneth Sprout, FNP  [id number]  LOS: 0 days   Brief hospital course:   Assessment & Plan: [name] is a 47 y.o. female with medical history significant of IBS, presented for the 2nd time with c/o not feeling well since 05/28/2022, and fevers and headache.   Fever of unknown etiology --intermittent high fever up to 102. --had LP on 06/04/22 that was neg.  Extensive workup including CTA chest, CT a/p w contrast were neg for infectious source.  RVP neg.   --labs pending: ASO, Monospot, EBV titers, CMV, Brucella antibody, parvovirus B19, ANA, RF, ANCA  --started on IV doxycycline for presume RMSF (IgM 0.96, equivocal) Plan: --ID consult today  Headache  --has received IV dilaudid, migraine cocktail, fioricet, Advil without much improvement.  ESR and CRP not significantly elevated.  MRI brain and MRV no acute finding. --neuro consulted --IV Depacon q8h PRN if headache persists   Abnormal liver function --hepatitis panel neg. --labs pending: ferritin, iron, tibc, ceruloplasmin, alpha 1 antitrypsin Check ANA, anti smooth muscle ab, AMA, Celiac panel  Hypokalemia --monitor and replete PRN   DVT prophylaxis: Lovenox SQ Code Status: Full code  Family Communication: boyfriend updated at bedside Level of care: Med-Surg Dispo:   The patient is from: home Anticipated d/c is to: home Anticipated d/c date is: 1-2 days   Subjective and Interval History:  [name] continues to have intermittent high fevers, with extensive workup all neg so far.  [name] continues to have headache.  No respiratory symptom, dysuria, diarrhea or abdominal pain.  ID consulted today.   Objective: Vitals:   06/07/22 0817 06/07/22 1209 06/07/22 1719 06/07/22 1725  BP: 135/87 (!) 154/99  (!) 132/100  Pulse: 64 65  (!) 105  Resp: 18 18  (!) 22  Temp: 98.4 F (36.9 C) 98.9 F (37.2 C) (!) 102.5 F (39.2 C)   TempSrc:  Oral Oral Oral   SpO2: 100% 100%  97%  Weight:      Height:        Intake/Output Summary (Last 24 hours) at 06/07/2022 1745 Last data filed at 06/07/2022 0528 Gross per 24 hour  Intake 300 ml  Output --  Net 300 ml   Filed Weights   06/06/22 1255  Weight: 88.9 kg    Examination:   Constitutional: NAD, AAOx3 HEENT: conjunctivae and lids normal, EOMI CV: No cyanosis.   RESP: normal respiratory effort, on RA Neuro: II - XII grossly intact.   Psych: depressed mood and affect.  Appropriate judgement and reason   Data Reviewed: I have personally reviewed labs and imaging studies  Time spent: 50 minutes  Enzo Bi, MD [facility] If 7PM-7AM, please contact night-coverage 06/07/2022, 5:45 PM

## 2022-06-07 NOTE — Consult Note (Signed)
NAME: [name]  DOB: 1975-06-18  MRN: [id number]  Date/Time: 06/07/2022 5:09 PM  REQUESTING PROVIDER: Dr. Billie Ruddy Subjective:  REASON FOR CONSULT: Fever with headache ? [name] is a 47 y.o. female presents with fever and headache of 1 week Last week on 05/30/22 she started with headaches and then developed fever and went [facility]. She came to the ED on 06/04/22 with head ache and fever and had LP which was normal- Her LFTS was abnormal then and it was thought to be due to tylenol which she was taking 1 gram alternating with Ibuprofen 4 tablets every 3 hours She  had LP  which was normal and culture normal Blood cuture neg RMSF IgM was mildly elevated She came back with persistent fever She has h/o b/l sacral pain and had seen [facility] and diagnosed according to her as sacro iliac inflammation and had received steroid injections- many months ago Now she has no pain With this fever she also had a rash on the rt side of abdomen which has resolved now No sick contacts Has a baseline cough which she says is due to smoking and has not gotten worse. No travel No animal or tick bites No outdoor activities recently    Past Medical History:  Diagnosis Date   BRCA negative 07/2018   MyRisk neg   Family history of breast cancer    IBIS=13%/riskscore=8.4%   Family history of ovarian cancer    Family history of pancreatic cancer    History of frequent urinary tract infections    Hypoglycemia    IBS (irritable bowel syndrome)     Past Surgical History:  Procedure Laterality Date   ABDOMINAL HYSTERECTOMY  07/2006   PARTIAL   BACK SURGERY     CHOLECYSTECTOMY     DIAGNOSTIC LAPAROSCOPY     test on bladder   GALLBLADDER SURGERY     LAPAROSCOPIC APPENDECTOMY N/A 12/08/2016   Procedure: APPENDECTOMY LAPAROSCOPIC;  Surgeon: Jules Husbands, MD;  Location: [facility] ORS;  Service: General;  Laterality: N/A;   MAXIMUM ACCESS (MAS)POSTERIOR LUMBAR INTERBODY FUSION (PLIF) 1 LEVEL N/A  05/06/2013   Procedure: LUMBAR FIVE-SACRAL ONE MAXIMUM ACCESS POSTERIOR LUMBAR INTERBODY FUSION;  Surgeon: Eustace Moore, MD;  Location: [facility];  Service: Neurosurgery;  Laterality: N/A;   TOE SURGERY     TUBAL LIGATION     WISDOM TOOTH EXTRACTION      Social History   Socioeconomic History   Marital status: Legally Separated    Spouse name: Not on file   Number of children: Not on file   Years of education: Not on file   Highest education level: Not on file  Occupational History   Not on file  Tobacco Use   Smoking status: Every Day    Packs/day: 0.50    Years: 20.00    Total pack years: 10.00    Types: Cigarettes    Last attempt to quit: 05/30/2018    Years since quitting: 4.0   Smokeless tobacco: Never  Vaping Use   Vaping Use: Never used  Substance and Sexual Activity   Alcohol use: Yes    Alcohol/week: 40.0 standard drinks of alcohol    Types: 40 Cans of beer per week   Drug use: No   Sexual activity: Yes    Partners: Female, Female    Birth control/protection: Surgical    Comment: Hysterectomy  Other Topics Concern   Not on file  Social History Narrative   Not  on file   Social Determinants of Health   Financial Resource Strain: High Risk (09/18/2021)   Overall Financial Resource Strain (CARDIA)    Difficulty of Paying Living Expenses: Hard  Food Insecurity: No Food Insecurity (06/07/2022)   Hunger Vital Sign    Worried About Running Out of Food in the Last Year: Never true    Ran Out of Food in the Last Year: Never true  Transportation Needs: No Transportation Needs (06/07/2022)   PRAPARE - Hydrologist (Medical): No    Lack of Transportation (Non-Medical): No  Physical Activity: Not on file  Stress: Not on file  Social Connections: Socially Isolated (09/18/2021)   Social Connection and Isolation Panel [NHANES]    Frequency of Communication with Friends and Family: More than three times a week    Frequency of Social Gatherings  with Friends and Family: Three times a week    Attends Religious Services: Never    Active Member of Clubs or Organizations: No    Attends Archivist Meetings: Never    Marital Status: Separated  Intimate Partner Violence: Not At Risk (06/07/2022)   Humiliation, Afraid, Rape, and Kick questionnaire    Fear of Current or Ex-Partner: No    Emotionally Abused: No    Physically Abused: No    Sexually Abused: No    Family History  Problem Relation Age of Onset   Hypertension Mother    COPD Mother    Epilepsy Mother    Sleep apnea Mother    Hypertension Father    Sleep apnea Father    Breast cancer Maternal Aunt 87   Ovarian cancer Maternal Aunt 45   Colon cancer Paternal Uncle 27   Pancreatic cancer Maternal Grandmother 56   Colon cancer Maternal Grandfather 63   Allergies  Allergen Reactions   Codeine Nausea And Vomiting   Sulfa Antibiotics Rash   Vicodin [Hydrocodone-Acetaminophen] Nausea Only    And itching all over   I? Current Facility-Administered Medications  Medication Dose Route Frequency Provider Last Rate Last Admin   diphenhydrAMINE (BENADRYL) injection 25 mg  25 mg Intravenous Q6H PRN Jani Gravel, MD   25 mg at 06/07/22 0435   doxycycline (VIBRAMYCIN) 100 mg in sodium chloride 0.9 % 250 mL IVPB  100 mg Intravenous Marjean Donna, MD 125 mL/hr at 06/07/22 1257 100 mg at 06/07/22 1257   enoxaparin (LOVENOX) injection 45 mg  0.5 mg/kg Subcutaneous Q24H Jani Gravel, MD   45 mg at 06/06/22 2357   FLUoxetine (PROZAC) capsule 40 mg  40 mg Oral Daily Enzo Bi, MD       ibuprofen (ADVIL) tablet 200 mg  200 mg Oral Q6H PRN Jani Gravel, MD   200 mg at 06/07/22 1202   ondansetron (ZOFRAN) tablet 4 mg  4 mg Oral Q6H PRN Jani Gravel, MD   4 mg at 06/07/22 4098   Or   ondansetron [facility]) injection 4 mg  4 mg Intravenous Q6H PRN Jani Gravel, MD       traZODone (DESYREL) tablet 100-200 mg  100-200 mg Oral QHS PRN Enzo Bi, MD       Current Outpatient Medications   Medication Sig Dispense Refill   FLUoxetine (PROZAC) 40 MG capsule Take 1 capsule (40 mg total) by mouth daily. 90 capsule 3   oxyCODONE (ROXICODONE) 5 MG immediate release tablet Take 1 tablet (5 mg total) by mouth every 6 (six) hours as needed for severe pain. 12 tablet 0  cyclobenzaprine (FLEXERIL) 10 MG tablet Take 1 tablet (10 mg total) by mouth 3 (three) times daily as needed for muscle spasms (neck pain/spasms). 30 tablet 0   hydrOXYzine (VISTARIL) 25 MG capsule TAKE 1 CAPSULE BY MOUTH EVERY 8 HOURS AS NEEDED 30 capsule 0   tiZANidine (ZANAFLEX) 4 MG tablet Take 4 mg by mouth every 6 (six) hours.     traZODone (DESYREL) 100 MG tablet Take 1-2 tablets PO as needed to assist with sleep 180 tablet 3   valACYclovir (VALTREX) 500 MG tablet Take 2 tablets (1,000 mg total) by mouth 2 (two) times daily. 20 tablet 11     Abtx:  Anti-infectives (From admission, onward)    Start     Dose/Rate Route Frequency Ordered Stop   06/06/22 2345  doxycycline (VIBRAMYCIN) 100 mg in sodium chloride 0.9 % 250 mL IVPB        100 mg 125 mL/hr over 120 Minutes Intravenous Every 12 hours 06/06/22 2335         REVIEW OF SYSTEMS:  Const:  fever, negative chills, negative weight loss Eyes: negative diplopia or visual changes, negative eye pain ENT: negative coryza, negative sore throat Resp: + cough, no hemoptysis, dyspnea Cards: negative for chest pain, palpitations, lower extremity edema GU: negative for frequency, dysuria and hematuria GI: Negative for abdominal pain, diarrhea, bleeding, constipation Skin as above Heme: negative for easy bruising and gum/nose bleeding MS: general weakness Neurolo severe  headaches, no dizziness, vertigo, memory problems  Psych:  anxiety, depression  Endocrine: negative for thyroid, diabetes Allergy/Immunology- as above Objective:  VITALS:  BP (!) 154/99   Pulse 65   Temp 98.9 F (37.2 C) (Oral)   Resp 18   Ht 5\' 6"  (1.676 m)   Wt 88.9 kg   SpO2 100%    BMI 31.64 kg/m   PHYSICAL EXAM:  General: Awake some distress cooperative, , appears stated age. Bright light bothers her Head: Normocephalic, without obvious abnormality, atraumatic. Eyes: Conjunctivae clear, anicteric sclerae. Pupils are equal ENT Nares normal. No drainage or sinus tenderness. Lips, mucosa, and tongue normal. No Thrush Neck: Supple, symmetrical, no adenopathy, thyroid: non tender no carotid bruit and no JVD. Back: No CVA tenderness. Lungs: Clear to auscultation bilaterally. No Wheezing or Rhonchi. No rales. Heart: Regular rate and rhythm, no murmur, rub or gallop. Abdomen: Soft, non-tender,not distended. Bowel sounds normal. No masses Extremities: atraumatic, no cyanosis. No edema. No clubbing Skin: No rashes or lesions. Or bruising Lymph: Cervical, supraclavicular normal. Neurologic: Grossly non-focal Pertinent Labs Lab Results CBC    Latest Ref Rng & Units 06/07/2022    1:20 AM 06/06/2022    1:34 PM 06/04/2022    7:22 PM  CBC  WBC 4.0 - 10.5 K/uL 4.0  6.7  6.0   Hemoglobin 12.0 - 15.0 g/dL 10.6  11.9  12.6   Hematocrit 36.0 - 46.0 % 32.3  35.3  37.7   Platelets 150 - 400 K/uL 183  207  232        Latest Ref Rng & Units 06/07/2022    1:20 AM 06/06/2022    1:34 PM 06/04/2022   11:33 PM  CMP  Glucose 70 - 99 mg/dL 140  97  223   BUN 6 - 20 mg/dL 7  8  10    Creatinine 0.44 - 1.00 mg/dL 0.68  0.82  0.74   Sodium 135 - 145 mmol/L 134  135  133   Potassium 3.5 - 5.1 mmol/L 3.1  3.5  3.3   Chloride 98 - 111 mmol/L 106  102  105   CO2 22 - 32 mmol/L 22  24  18    Calcium 8.9 - 10.3 mg/dL 7.6  8.3  7.4   Total Protein 6.5 - 8.1 g/dL 5.8  6.9  5.7   Total Bilirubin 0.3 - 1.2 mg/dL 1.1  0.6  0.4   Alkaline Phos 38 - 126 U/L 263  174  142   AST 15 - 41 U/L 346  107  120   ALT 0 - 44 U/L 308  160  136      Microbiology: Recent Results (from the past 240 hour(s))  Urine Culture     Status: None   Collection Time: 06/03/22  5:09 PM   Specimen: Urine, Clean Catch   Result Value Ref Range Status   Specimen Description URINE, CLEAN CATCH  Final   Special Requests NONE  Final   Culture   Final    NO GROWTH Performed at [facility], [place] [address], [place], [place] [address]    Report Status 06/06/2022 FINAL  Final  Resp panel by RT-PCR (RSV, Flu A&B, Covid) Anterior Nasal Swab     Status: None   Collection Time: 06/04/22  1:52 PM   Specimen: Anterior Nasal Swab  Result Value Ref Range Status   SARS Coronavirus 2 by RT PCR NEGATIVE NEGATIVE Final    Comment: (NOTE) SARS-CoV-2 target nucleic acids are NOT DETECTED.  The SARS-CoV-2 RNA is generally detectable in upper respiratory specimens during the acute phase of infection. The lowest concentration of SARS-CoV-2 viral copies this assay can detect is 138 copies/mL. A negative result does not preclude SARS-Cov-2 infection and should not be used as the sole basis for treatment or other patient management decisions. A negative result may occur with  improper specimen collection/handling, submission of specimen other than nasopharyngeal swab, presence of viral mutation(s) within the areas targeted by this assay, and inadequate number of viral copies(<138 copies/mL). A negative result must be combined with clinical observations, patient history, and epidemiological information. The expected result is Negative.  Fact Sheet for Patients:  EntrepreneurPulse.com.au  Fact Sheet for Healthcare Providers:  IncredibleEmployment.be  This test is no t yet approved or cleared by the [place] FDA and  has been authorized for detection and/or diagnosis of SARS-CoV-2 by FDA under an Emergency Use Authorization (EUA). This EUA will remain  in effect (meaning this test can be used) for the duration of the COVID-19 declaration under Section 564(b)(1) of the Act, 21 U.S.C.section 360bbb-3(b)(1), unless the authorization is terminated  or revoked sooner.        Influenza A by PCR NEGATIVE NEGATIVE Final   Influenza B by PCR NEGATIVE NEGATIVE Final    Comment: (NOTE) The Xpert Xpress SARS-CoV-2/FLU/RSV plus assay is intended as an aid in the diagnosis of influenza from Nasopharyngeal swab specimens and should not be used as a sole basis for treatment. Nasal washings and aspirates are unacceptable for Xpert Xpress SARS-CoV-2/FLU/RSV testing.  Fact Sheet for Patients: EntrepreneurPulse.com.au  Fact Sheet for Healthcare Providers: IncredibleEmployment.be  This test is not yet approved or cleared by the [place] FDA and has been authorized for detection and/or diagnosis of SARS-CoV-2 by FDA under an Emergency Use Authorization (EUA). This EUA will remain in effect (meaning this test can be used) for the duration of the COVID-19 declaration under Section 564(b)(1) of the Act, 21 U.S.C. section 360bbb-3(b)(1), unless the authorization is terminated or revoked.  Resp Syncytial Virus by PCR NEGATIVE NEGATIVE Final    Comment: (NOTE) Fact Sheet for Patients: EntrepreneurPulse.com.au  Fact Sheet for Healthcare Providers: IncredibleEmployment.be  This test is not yet approved or cleared by the [place] FDA and has been authorized for detection and/or diagnosis of SARS-CoV-2 by FDA under an Emergency Use Authorization (EUA). This EUA will remain in effect (meaning this test can be used) for the duration of the COVID-19 declaration under Section 564(b)(1) of the Act, 21 U.S.C. section 360bbb-3(b)(1), unless the authorization is terminated or revoked.  Performed at [facility], [address] [place], [place] [address]   Culture, blood (Routine X 2)     Status: None (Preliminary result)   Collection Time: 06/04/22  6:59 PM   Specimen: BLOOD  Result Value Ref Range Status   Specimen Description BLOOD RIGHT ANTECUBITAL  Final   Special Requests    Final    BOTTLES DRAWN AEROBIC AND ANAEROBIC Blood Culture results may not be optimal due to an excessive volume of blood received in culture bottles   Culture   Final    NO GROWTH 3 DAYS Performed at [facility], [address] [place], [place] [address]    Report Status PENDING  Incomplete  Culture, blood (Routine X 2)     Status: None (Preliminary result)   Collection Time: 06/04/22  7:04 PM   Specimen: BLOOD  Result Value Ref Range Status   Specimen Description BLOOD LEFT ANTECUBITAL  Final   Special Requests   Final    BOTTLES DRAWN AEROBIC AND ANAEROBIC Blood Culture adequate volume   Culture   Final    NO GROWTH 3 DAYS Performed at [facility], [address] [place], [place] [address]    Report Status PENDING  Incomplete  CSF culture w Gram Stain     Status: None   Collection Time: 06/04/22  9:42 PM   Specimen: CSF; Cerebrospinal Fluid  Result Value Ref Range Status   Specimen Description   Final    CSF Performed at [facility], [address] [place], [place] [address]    Special Requests   Final    Normal Performed at [facility], [address] [place], [place] [address]    Gram Stain   Final    WBC SEEN NO RBC SEEN NO ORGANISMS SEEN Performed at [facility], [address] [place], [place] [address]    Culture   Final    NO GROWTH Performed at [facility], [place] [address] [place], [place] [address]    Report Status 06/07/2022 FINAL  Final  Resp Panel by RT-PCR (Flu A&B, Covid) Anterior Nasal Swab     Status: None   Collection Time: 06/06/22  5:24 PM   Specimen: Anterior Nasal Swab  Result Value Ref Range Status   SARS Coronavirus 2 by RT PCR NEGATIVE NEGATIVE Final    Comment: (NOTE) SARS-CoV-2 target nucleic acids are NOT DETECTED.  The SARS-CoV-2 RNA is generally detectable in upper respiratory specimens during the acute phase of infection. The lowest concentration of SARS-CoV-2 viral  copies this assay can detect is 138 copies/mL. A negative result does not preclude SARS-Cov-2 infection and should not be used as the sole basis for treatment or other patient management decisions. A negative result may occur with  improper specimen collection/handling, submission of specimen other than nasopharyngeal swab, presence of viral mutation(s) within the areas targeted by this assay, and inadequate number of  viral copies(<138 copies/mL). A negative result must be combined with clinical observations, patient history, and epidemiological information. The expected result is Negative.  Fact Sheet for Patients:  EntrepreneurPulse.com.au  Fact Sheet for Healthcare Providers:  IncredibleEmployment.be  This test is no t yet approved or cleared by the [place] FDA and  has been authorized for detection and/or diagnosis of SARS-CoV-2 by FDA under an Emergency Use Authorization (EUA). This EUA will remain  in effect (meaning this test can be used) for the duration of the COVID-19 declaration under Section 564(b)(1) of the Act, 21 U.S.C.section 360bbb-3(b)(1), unless the authorization is terminated  or revoked sooner.       Influenza A by PCR NEGATIVE NEGATIVE Final   Influenza B by PCR NEGATIVE NEGATIVE Final    Comment: (NOTE) The Xpert Xpress SARS-CoV-2/FLU/RSV plus assay is intended as an aid in the diagnosis of influenza from Nasopharyngeal swab specimens and should not be used as a sole basis for treatment. Nasal washings and aspirates are unacceptable for Xpert Xpress SARS-CoV-2/FLU/RSV testing.  Fact Sheet for Patients: EntrepreneurPulse.com.au  Fact Sheet for Healthcare Providers: IncredibleEmployment.be  This test is not yet approved or cleared by the [place] FDA and has been authorized for detection and/or diagnosis of SARS-CoV-2 by FDA under an Emergency Use Authorization (EUA). This  EUA will remain in effect (meaning this test can be used) for the duration of the COVID-19 declaration under Section 564(b)(1) of the Act, 21 U.S.C. section 360bbb-3(b)(1), unless the authorization is terminated or revoked.  Performed at [facility], [place], [place] [address]   Respiratory (~20 pathogens) panel by PCR     Status: None   Collection Time: 06/07/22  8:18 AM   Specimen: Nasopharyngeal Swab; Respiratory  Result Value Ref Range Status   Adenovirus NOT DETECTED NOT DETECTED Final   Coronavirus 229E NOT DETECTED NOT DETECTED Final    Comment: (NOTE) The Coronavirus on the Respiratory Panel, DOES NOT test for the novel  Coronavirus (2019 nCoV)    Coronavirus HKU1 NOT DETECTED NOT DETECTED Final   Coronavirus NL63 NOT DETECTED NOT DETECTED Final   Coronavirus OC43 NOT DETECTED NOT DETECTED Final   Metapneumovirus NOT DETECTED NOT DETECTED Final   Rhinovirus / Enterovirus NOT DETECTED NOT DETECTED Final   Influenza A NOT DETECTED NOT DETECTED Final   Influenza B NOT DETECTED NOT DETECTED Final   Parainfluenza Virus 1 NOT DETECTED NOT DETECTED Final   Parainfluenza Virus 2 NOT DETECTED NOT DETECTED Final   Parainfluenza Virus 3 NOT DETECTED NOT DETECTED Final   Parainfluenza Virus 4 NOT DETECTED NOT DETECTED Final   Respiratory Syncytial Virus NOT DETECTED NOT DETECTED Final   Bordetella pertussis NOT DETECTED NOT DETECTED Final   Bordetella Parapertussis NOT DETECTED NOT DETECTED Final   Chlamydophila pneumoniae NOT DETECTED NOT DETECTED Final   Mycoplasma pneumoniae NOT DETECTED NOT DETECTED Final    Comment: Performed at [facility], [place]. [address], [place], [place] [address]  ESR 34 CRP 5.9  IMAGING RESULTS: MRI brain Normal CT abdomen/chest -nothing acute to explain the fever  I have personally reviewed the films ? Impression/Recommendation ? Fever with head ache sicne 05/30/22 LP done on 06/05/23 was normal Look like viral  illness We need to repeat Flu test as the first test may not be an appropriate sample Clinically and epidemiologically not RMSF- the IgM is slightly elevated but that is not a reliable test and is never used to diagnosed RMSF- I am  okay with giving doxycycline challenge for now- IF no response in 48 hrs then unlikely to be RMSF  and may not be worth continuing it.  Severe headache could be rebound to NSAID  Abnormal LFTS- could be DILI vs infectious cause -agree with checking for CMV   Could the fever be due to autoimmune condition like stills or such- currently that is not the top in the D.D But with h/o sacroilitis it may be considered  Anemia ? dilutional ? __________________________________________________ Discussed with patient, partner and requesting provider Note:  This document was prepared using Dragon voice recognition software and may include unintentional dictation errors.

## 2022-06-07 NOTE — ED Notes (Signed)
Transport arrived to transport pt to room. Pt stable at time of departure. 

## 2022-06-08 ENCOUNTER — Inpatient Hospital Stay
Admit: 2022-06-08 | Discharge: 2022-06-08 | Disposition: A | Discharge status: Home / Self Care | Payer: 59 | Attending: Internal Medicine | Admitting: Internal Medicine

## 2022-06-08 DIAGNOSIS — R509 Fever, unspecified: Secondary | ICD-10-CM | POA: Diagnosis not present

## 2022-06-08 LAB — CBC
HCT: 35.6 % — ABNORMAL LOW (ref 36.0–46.0)
Hemoglobin: 11.9 g/dL — ABNORMAL LOW (ref 12.0–15.0)
MCH: 31.2 pg (ref 26.0–34.0)
MCHC: 33.4 g/dL (ref 30.0–36.0)
MCV: 93.2 fL (ref 80.0–100.0)
Platelets: 201 10*3/uL (ref 150–400)
RBC: 3.82 MIL/uL — ABNORMAL LOW (ref 3.87–5.11)
RDW: 11.9 % (ref 11.5–15.5)
WBC: 5 10*3/uL (ref 4.0–10.5)
nRBC: 0 % (ref 0.0–0.2)

## 2022-06-08 LAB — HEPATIC FUNCTION PANEL
ALT: 262 U/L — ABNORMAL HIGH (ref 0–44)
AST: 128 U/L — ABNORMAL HIGH (ref 15–41)
Albumin: 3 g/dL — ABNORMAL LOW (ref 3.5–5.0)
Alkaline Phosphatase: 285 U/L — ABNORMAL HIGH (ref 38–126)
Bilirubin, Direct: 0.3 mg/dL — ABNORMAL HIGH (ref 0.0–0.2)
Indirect Bilirubin: 0.7 mg/dL (ref 0.3–0.9)
Total Bilirubin: 1 mg/dL (ref 0.3–1.2)
Total Protein: 6.5 g/dL (ref 6.5–8.1)

## 2022-06-08 LAB — MAGNESIUM: Magnesium: 2.1 mg/dL (ref 1.7–2.4)

## 2022-06-08 LAB — CHLAMYDIA/NGC RT PCR (ARMC ONLY)
Chlamydia Tr: NOT DETECTED
N gonorrhoeae: NOT DETECTED

## 2022-06-08 LAB — BASIC METABOLIC PANEL
Anion gap: 8 (ref 5–15)
BUN: 6 mg/dL (ref 6–20)
CO2: 25 mmol/L (ref 22–32)
Calcium: 8.4 mg/dL — ABNORMAL LOW (ref 8.9–10.3)
Chloride: 105 mmol/L (ref 98–111)
Creatinine, Ser: 0.74 mg/dL (ref 0.44–1.00)
GFR, Estimated: >60 mL/min (ref >60)
Glucose, Bld: 94 mg/dL (ref 70–99)
Potassium: 3.5 mmol/L (ref 3.5–5.1)
Sodium: 138 mmol/L (ref 135–145)

## 2022-06-08 LAB — ECHOCARDIOGRAM COMPLETE
Height: 66 in
S' Lateral: 2.1 cm
Weight: 3132.3 oz

## 2022-06-08 MED ORDER — SODIUM CHLORIDE 0.9 % IV SOLN
12.5000 mg | Freq: Three times a day (TID) | INTRAVENOUS | Status: DC | PRN
  Administered 2022-06-08: 12.5 mg via INTRAVENOUS
  Filled 2022-06-08: qty 0.5

## 2022-06-08 MED ORDER — PROCHLORPERAZINE EDISYLATE 10 MG/2ML IJ SOLN
10.0000 mg | Freq: Four times a day (QID) | INTRAMUSCULAR | Status: DC | PRN
  Administered 2022-06-08 – 2022-06-09 (×2): 10 mg via INTRAVENOUS
  Filled 2022-06-08 (×3): qty 2

## 2022-06-08 MED ORDER — DIPHENHYDRAMINE HCL 50 MG/ML IJ SOLN
25.0000 mg | Freq: Four times a day (QID) | INTRAMUSCULAR | Status: DC | PRN
  Administered 2022-06-08 – 2022-06-10 (×7): 25 mg via INTRAVENOUS
  Filled 2022-06-08 (×7): qty 1

## 2022-06-08 MED ORDER — DIPHENHYDRAMINE HCL 50 MG/ML IJ SOLN
25.0000 mg | Freq: Three times a day (TID) | INTRAMUSCULAR | Status: DC | PRN
  Administered 2022-06-08: 25 mg via INTRAVENOUS
  Filled 2022-06-08: qty 1

## 2022-06-08 MED ORDER — KETOROLAC TROMETHAMINE 15 MG/ML IJ SOLN
15.0000 mg | Freq: Four times a day (QID) | INTRAMUSCULAR | Status: DC | PRN
  Administered 2022-06-08 – 2022-06-09 (×3): 15 mg via INTRAVENOUS
  Filled 2022-06-08 (×3): qty 1

## 2022-06-08 MED ORDER — MAGNESIUM SULFATE IN D5W 1-5 GM/100ML-% IV SOLN
1.0000 g | Freq: Once | INTRAVENOUS | Status: AC
  Administered 2022-06-08: 1 g via INTRAVENOUS
  Filled 2022-06-08: qty 100

## 2022-06-08 NOTE — Progress Notes (Signed)
       CROSS COVER NOTE  NAME: [name] MRN: [id number] DOB : 1976-04-15    HPI/Events of Note   Temp 102.2 Poor oral intake  Assessment and  Interventions   Assessment:  Plan: NS at 50 ml/h initiated       Kathlene Cote NP [facility]

## 2022-06-08 NOTE — Plan of Care (Signed)
Neurology plan of care  - Patient seen by teleneurology on 1/6 for headache - LP done on 06/05/23 was normal  - ID consulted, favor it to be viral illness but not RMSF - Defer further mgmt to ID and primary team, please reconsult if new neurologic concerns arise  Su Monks, MD [facility] [phone]  If 7pm- 7am, please page neurology on call as listed in [place].

## 2022-06-08 NOTE — Progress Notes (Signed)
  PROGRESS NOTE    [name]  EXB:[id number] DOB: Oct 15, 1975 DOA: 06/06/2022 PCP: Gwyneth Sprout, FNP  128A/128A-AA  LOS: 1 day   Brief hospital course:   Assessment & Plan: [name] is a 47 y.o. female with medical history significant of IBS, presented for the 2nd time with c/o not feeling well since 05/28/2022, and fevers and headache.   Fever of unknown etiology --intermittent high fever up to 102. --had LP on 06/04/22 that was neg.  Extensive workup including CTA chest, CT a/p w contrast were neg for infectious source.  RVP neg.  MRI lumbar spine neg. --labs pending: ASO, Monospot, EBV titers, CMV, Brucella antibody, parvovirus B19, ANA, RF, ANCA  --started on IV doxycycline for presume RMSF (IgM 0.96, equivocal) --ID consulted Plan: --cont doxy pending ID rec  Headache  --has received IV dilaudid, migraine cocktail, fioricet, Advil, IV Depacon without much improvement.  ESR and CRP not significantly elevated.  MRI brain and MRV no acute finding. --neuro consulted --Avoid opioids, Fioricet and Advil per neuro. --pt reported Depacon did not help as much as migraine cocktail, so d/c'ed. --migraine cocktail with IV benadryl and IV phenergan PRN for headache   Abnormal liver function --hepatitis panel neg. --labs pending: ferritin, iron, tibc, ceruloplasmin, alpha 1 antitrypsin Check ANA, anti smooth muscle ab, AMA, Celiac panel  Hypokalemia --monitor and replete PRN   DVT prophylaxis: Lovenox SQ Code Status: Full code  Family Communication: boyfriend updated at bedside Level of care: Med-Surg Dispo:   The patient is from: home Anticipated d/c is to: home Anticipated d/c date is: 1-2 days   Subjective and Interval History:  No more fever since last night.  Headache somewhat improved.     Objective: Vitals:   06/08/22 0500 06/08/22 0514 06/08/22 0935 06/08/22 1702  BP:  139/65 117/71 136/79  Pulse:  62 73 63  Resp:  16 18 16   Temp:  98 F (36.7 C) 98.2  F (36.8 C) 99 F (37.2 C)  TempSrc:  Oral    SpO2:  100% 96% 97%  Weight: 88.8 kg     Height:        Intake/Output Summary (Last 24 hours) at 06/08/2022 1745 Last data filed at 06/08/2022 0917 Gross per 24 hour  Intake 1086 ml  Output --  Net 1086 ml   Filed Weights   06/06/22 1255 06/07/22 2330 06/08/22 0500  Weight: 88.9 kg 89.4 kg 88.8 kg    Examination:   Constitutional: NAD, AAOx3 HEENT: conjunctivae and lids normal, EOMI CV: No cyanosis.   RESP: normal respiratory effort, on RA Neuro: II - XII grossly intact.   Psych: subdued mood and affect.  Appropriate judgement and reason   Data Reviewed: I have personally reviewed labs and imaging studies  Time spent: 35 minutes  Enzo Bi, MD [facility] If 7PM-7AM, please contact night-coverage 06/08/2022, 5:45 PM

## 2022-06-08 NOTE — Plan of Care (Signed)
Patient has remained alert, oriented, and able to voice needs during shift. Patient is independent with ADLs. Patient has not been febrile but has consistently complained of a headache. Call bell remained within reach. Bed in lowest position. Will continue to monitor.    Problem: Education: Goal: Knowledge of General Education information will improve Description: Including pain rating scale, medication(s)/side effects and non-pharmacologic comfort measures Outcome: Progressing   Problem: Health Behavior/Discharge Planning: Goal: Ability to manage health-related needs will improve Outcome: Progressing   Problem: Clinical Measurements: Goal: Ability to maintain clinical measurements within normal limits will improve Outcome: Progressing Goal: Will remain free from infection Outcome: Progressing Goal: Diagnostic test results will improve Outcome: Progressing Goal: Respiratory complications will improve Outcome: Progressing Goal: Cardiovascular complication will be avoided Outcome: Progressing   Problem: Activity: Goal: Risk for activity intolerance will decrease Outcome: Progressing   Problem: Nutrition: Goal: Adequate nutrition will be maintained Outcome: Progressing   Problem: Coping: Goal: Level of anxiety will decrease Outcome: Progressing   Problem: Elimination: Goal: Will not experience complications related to bowel motility Outcome: Progressing Goal: Will not experience complications related to urinary retention Outcome: Progressing   Problem: Pain Managment: Goal: General experience of comfort will improve Outcome: Progressing   Problem: Safety: Goal: Ability to remain free from injury will improve Outcome: Progressing   Problem: Skin Integrity: Goal: Risk for impaired skin integrity will decrease Outcome: Progressing

## 2022-06-08 NOTE — Progress Notes (Signed)
  Echocardiogram 2D Echocardiogram has been performed.  Claretta Fraise 06/08/2022, 3:08 PM

## 2022-06-09 DIAGNOSIS — R509 Fever, unspecified: Secondary | ICD-10-CM | POA: Diagnosis not present

## 2022-06-09 LAB — CULTURE, BLOOD (ROUTINE X 2)
Culture: NO GROWTH
Culture: NO GROWTH
Special Requests: ADEQUATE

## 2022-06-09 LAB — BASIC METABOLIC PANEL
Anion gap: 9 (ref 5–15)
BUN: 6 mg/dL (ref 6–20)
CO2: 24 mmol/L (ref 22–32)
Calcium: 8.1 mg/dL — ABNORMAL LOW (ref 8.9–10.3)
Chloride: 105 mmol/L (ref 98–111)
Creatinine, Ser: 0.78 mg/dL (ref 0.44–1.00)
GFR, Estimated: >60 mL/min (ref >60)
Glucose, Bld: 91 mg/dL (ref 70–99)
Potassium: 3.6 mmol/L (ref 3.5–5.1)
Sodium: 138 mmol/L (ref 135–145)

## 2022-06-09 LAB — CBC
HCT: 34 % — ABNORMAL LOW (ref 36.0–46.0)
Hemoglobin: 11.6 g/dL — ABNORMAL LOW (ref 12.0–15.0)
MCH: 31.3 pg (ref 26.0–34.0)
MCHC: 34.1 g/dL (ref 30.0–36.0)
MCV: 91.6 fL (ref 80.0–100.0)
Platelets: 228 10*3/uL (ref 150–400)
RBC: 3.71 MIL/uL — ABNORMAL LOW (ref 3.87–5.11)
RDW: 11.7 % (ref 11.5–15.5)
WBC: 5.1 10*3/uL (ref 4.0–10.5)
nRBC: 0 % (ref 0.0–0.2)

## 2022-06-09 LAB — MAGNESIUM: Magnesium: 2.1 mg/dL (ref 1.7–2.4)

## 2022-06-09 LAB — HEPATIC FUNCTION PANEL
ALT: 185 U/L — ABNORMAL HIGH (ref 0–44)
AST: 61 U/L — ABNORMAL HIGH (ref 15–41)
Albumin: 2.9 g/dL — ABNORMAL LOW (ref 3.5–5.0)
Alkaline Phosphatase: 248 U/L — ABNORMAL HIGH (ref 38–126)
Bilirubin, Direct: 0.2 mg/dL (ref 0.0–0.2)
Indirect Bilirubin: 0.5 mg/dL (ref 0.3–0.9)
Total Bilirubin: 0.7 mg/dL (ref 0.3–1.2)
Total Protein: 6.2 g/dL — ABNORMAL LOW (ref 6.5–8.1)

## 2022-06-09 MED ORDER — ACETAMINOPHEN 500 MG PO TABS
1000.0000 mg | ORAL_TABLET | Freq: Three times a day (TID) | ORAL | Status: DC | PRN
  Administered 2022-06-10 – 2022-06-11 (×5): 1000 mg via ORAL
  Filled 2022-06-09 (×6): qty 2

## 2022-06-09 MED ORDER — KETOROLAC TROMETHAMINE 15 MG/ML IJ SOLN: 15.0000 mg | Freq: Once | INTRAMUSCULAR | Status: DC

## 2022-06-09 MED ORDER — METOCLOPRAMIDE HCL 5 MG/ML IJ SOLN
20.0000 mg | Freq: Once | INTRAVENOUS | Status: DC
  Filled 2022-06-09: qty 4

## 2022-06-09 MED ORDER — GUAIFENESIN-DM 100-10 MG/5ML PO SYRP
10.0000 mL | ORAL_SOLUTION | Freq: Four times a day (QID) | ORAL | Status: DC | PRN
  Administered 2022-06-09 – 2022-06-10 (×3): 10 mL via ORAL
  Filled 2022-06-09 (×3): qty 10

## 2022-06-09 MED ORDER — DEXAMETHASONE SODIUM PHOSPHATE 10 MG/ML IJ SOLN
24.0000 mg | Freq: Once | INTRAMUSCULAR | Status: AC
  Administered 2022-06-09: 24 mg via INTRAVENOUS
  Filled 2022-06-09: qty 3

## 2022-06-09 MED ORDER — BENZONATATE 100 MG PO CAPS
100.0000 mg | ORAL_CAPSULE | Freq: Three times a day (TID) | ORAL | Status: DC | PRN
  Administered 2022-06-09 – 2022-06-10 (×3): 100 mg via ORAL
  Filled 2022-06-09 (×3): qty 1

## 2022-06-09 MED ORDER — PROCHLORPERAZINE EDISYLATE 10 MG/2ML IJ SOLN
10.0000 mg | Freq: Four times a day (QID) | INTRAMUSCULAR | Status: DC | PRN
  Administered 2022-06-09 – 2022-06-10 (×4): 10 mg via INTRAVENOUS
  Filled 2022-06-09 (×5): qty 2

## 2022-06-09 MED ORDER — DIPHENHYDRAMINE HCL 50 MG/ML IJ SOLN: 50.0000 mg | Freq: Once | INTRAMUSCULAR | Status: DC

## 2022-06-09 NOTE — Progress Notes (Signed)
  PROGRESS NOTE    [name]  [id number] DOB: 1976-04-27 DOA: 06/06/2022 PCP: Gwyneth Sprout, FNP  128A/128A-AA  LOS: 2 days   Brief hospital course:   Assessment & Plan: [name] is a 47 y.o. female with medical history significant of IBS, presented for the 2nd time with c/o not feeling well since 05/28/2022, and fevers and headache.   Fever of unknown etiology --intermittent high fever up to 102. --had LP on 06/04/22 that was neg.  Extensive workup including CTA chest, CT a/p w contrast were neg for infectious source.  RVP neg.  MRI lumbar spine neg. --labs pending: ASO, Monospot, EBV titers, CMV, Brucella antibody, parvovirus B19, ANA, RF, ANCA  --started on IV doxycycline for presume RMSF (IgM 0.96, equivocal) --ID consulted Plan: --cont doxy pending ID rec  Intractable Headache 2/2 rebound --per neuro, likely rebound headache since pt was taking tylenol or ibuprofen q 3 hrs until admission  --has received IV dilaudid, migraine cocktail, fioricet, Advil, IV Depacon, IV toradol without much improvement.  ESR and CRP not significantly elevated.  MRI brain and MRV no acute finding. --neuro consulted Plan: --benadryl/reglan/toradol and 24mg  decadron today, per neuro rec --Avoid opioids, Fioricet and Advil per neuro. --hold off on any further depakote given her transaminitis   Abnormal liver function --maybe due to excess tylenol use at home. --hepatitis panel neg. --labs pending: ferritin, iron, tibc, ceruloplasmin, alpha 1 antitrypsin Check ANA, anti smooth muscle ab, AMA, Celiac panel  Hypokalemia --monitor and replete PRN   DVT prophylaxis: Lovenox SQ Code Status: Full code  Family Communication: boyfriend updated at bedside Level of care: Med-Surg Dispo:   The patient is from: home Anticipated d/c is to: home Anticipated d/c date is: 1-2 days   Subjective and Interval History:  Continued to complain of severe headache.  Neuro rec cocktail  benadryl/reglan/toradol and this time add 24mg  decadron.   Objective: Vitals:   06/09/22 0841 06/09/22 1057 06/09/22 1601 06/09/22 1601  BP: 139/78  (!) 155/93 (!) 155/93  Pulse: 60  (!) 53 (!) 52  Resp: 17  16 16   Temp: 98.2 F (36.8 C) 98.3 F (36.8 C) 97.9 F (36.6 C) 97.9 F (36.6 C)  TempSrc:      SpO2: 98%  97% 98%  Weight:      Height:        Intake/Output Summary (Last 24 hours) at 06/09/2022 1848 Last data filed at 06/09/2022 1804 Gross per 24 hour  Intake 3516.41 ml  Output --  Net 3516.41 ml   Filed Weights   06/06/22 1255 06/07/22 2330 06/08/22 0500  Weight: 88.9 kg 89.4 kg 88.8 kg    Examination:   Constitutional: NAD, AAOx3 HEENT: conjunctivae and lids normal, EOMI CV: No cyanosis.   RESP: normal respiratory effort, on RA Psych: depressed mood and affect.  Appropriate judgement and reason   Data Reviewed: I have personally reviewed labs and imaging studies  Time spent: 50 minutes  Enzo Bi, MD [facility] If 7PM-7AM, please contact night-coverage 06/09/2022, 6:48 PM

## 2022-06-09 NOTE — Plan of Care (Signed)
Neurology plan of care   Patient seen by teleneurology on 1/6 for headache. MRI brain wwo contrast was normal. MRV showed no e/o DVST. LP on 06/05/23 showed no pleiocytosis. ID was consulted who favored headache and fever to be secondary to viral illness. She is on doxycycline for possible RMSF although ID felt this is less likely. Neurology had signed off and deferred further mgmt to ID. I was contacted by Dr. Billie Ruddy today that patient has a persistent headache. They have tried multiple cocktails including benadryl, reglan, toradol, and compazine, as well as fioricet and depakote. Prior to admission patient was taking alternating ibuprofen and tylenol q 3 hours for duration sufficient to cause transaminitis and she received multiple doses of dilaudid in the first 24 hours of admission. Headache is favored to be 2/2 rebound headache aggravated in the setting of viral illness. I recommended cocktail of benadryl/reglan/toradol + decadron. Trial doses of decadron for acute migraine have ranged up to 24mg ; most studies show no increased benefit of doses over 15mg  but I did recommend 24mg  in this case because her headache has persisted for days. IV fluids will also be helpful. I would not recommend any further depakote in the setting of transaminitis. If her headache persists or new neurologic concerns arise feel free to reconsult neurology.  Su Monks, MD [facility] [phone]  If 7pm- 7am, please page neurology on call as listed in [place].

## 2022-06-10 DIAGNOSIS — F17210 Nicotine dependence, cigarettes, uncomplicated: Secondary | ICD-10-CM | POA: Diagnosis not present

## 2022-06-10 DIAGNOSIS — J101 Influenza due to other identified influenza virus with other respiratory manifestations: Secondary | ICD-10-CM

## 2022-06-10 DIAGNOSIS — R509 Fever, unspecified: Secondary | ICD-10-CM | POA: Diagnosis not present

## 2022-06-10 LAB — BASIC METABOLIC PANEL
Anion gap: 8 (ref 5–15)
BUN: 8 mg/dL (ref 6–20)
CO2: 25 mmol/L (ref 22–32)
Calcium: 8.5 mg/dL — ABNORMAL LOW (ref 8.9–10.3)
Chloride: 105 mmol/L (ref 98–111)
Creatinine, Ser: 0.67 mg/dL (ref 0.44–1.00)
GFR, Estimated: >60 mL/min (ref >60)
Glucose, Bld: 125 mg/dL — ABNORMAL HIGH (ref 70–99)
Potassium: 4 mmol/L (ref 3.5–5.1)
Sodium: 138 mmol/L (ref 135–145)

## 2022-06-10 LAB — HEPATIC FUNCTION PANEL
ALT: 170 U/L — ABNORMAL HIGH (ref 0–44)
AST: 72 U/L — ABNORMAL HIGH (ref 15–41)
Albumin: 3 g/dL — ABNORMAL LOW (ref 3.5–5.0)
Alkaline Phosphatase: 246 U/L — ABNORMAL HIGH (ref 38–126)
Bilirubin, Direct: 0.2 mg/dL (ref 0.0–0.2)
Indirect Bilirubin: 0.5 mg/dL (ref 0.3–0.9)
Total Bilirubin: 0.7 mg/dL (ref 0.3–1.2)
Total Protein: 6.6 g/dL (ref 6.5–8.1)

## 2022-06-10 LAB — CBC
HCT: 35.2 % — ABNORMAL LOW (ref 36.0–46.0)
Hemoglobin: 11.8 g/dL — ABNORMAL LOW (ref 12.0–15.0)
MCH: 31.2 pg (ref 26.0–34.0)
MCHC: 33.5 g/dL (ref 30.0–36.0)
MCV: 93.1 fL (ref 80.0–100.0)
Platelets: 246 10*3/uL (ref 150–400)
RBC: 3.78 MIL/uL — ABNORMAL LOW (ref 3.87–5.11)
RDW: 11.8 % (ref 11.5–15.5)
WBC: 7.1 10*3/uL (ref 4.0–10.5)
nRBC: 0 % (ref 0.0–0.2)

## 2022-06-10 LAB — PARVOVIRUS B19 ANTIBODY, IGG AND IGM
Parovirus B19 IgG Abs: 0.1 index (ref 0.0–0.8)
Parovirus B19 IgM Abs: 0.1 index (ref 0.0–0.8)

## 2022-06-10 LAB — MAGNESIUM: Magnesium: 2.3 mg/dL (ref 1.7–2.4)

## 2022-06-10 LAB — RESP PANEL BY RT-PCR (RSV, FLU A&B, COVID)  RVPGX2
Influenza A by PCR: POSITIVE — AB
Influenza B by PCR: NEGATIVE
Resp Syncytial Virus by PCR: NEGATIVE
SARS Coronavirus 2 by RT PCR: NEGATIVE

## 2022-06-10 MED ORDER — DM-GUAIFENESIN ER 30-600 MG PO TB12
1.0000 | ORAL_TABLET | Freq: Two times a day (BID) | ORAL | Status: DC
  Administered 2022-06-10 – 2022-06-13 (×6): 1 via ORAL
  Filled 2022-06-10 (×6): qty 1

## 2022-06-10 MED ORDER — KETOROLAC TROMETHAMINE 15 MG/ML IJ SOLN
15.0000 mg | Freq: Three times a day (TID) | INTRAMUSCULAR | Status: DC | PRN
  Administered 2022-06-10 – 2022-06-11 (×3): 15 mg via INTRAVENOUS
  Filled 2022-06-10 (×3): qty 1

## 2022-06-10 MED ORDER — HYDROCOD POLI-CHLORPHE POLI ER 10-8 MG/5ML PO SUER
5.0000 mL | Freq: Two times a day (BID) | ORAL | Status: DC | PRN
  Administered 2022-06-10 – 2022-06-13 (×4): 5 mL via ORAL
  Filled 2022-06-10 (×4): qty 5

## 2022-06-10 MED ORDER — SODIUM CHLORIDE 0.9 % IV BOLUS
1000.0000 mL | Freq: Once | INTRAVENOUS | Status: AC
  Administered 2022-06-10: 1000 mL via INTRAVENOUS

## 2022-06-10 MED ORDER — PHENOL 1.4 % MT LIQD
1.0000 | OROMUCOSAL | Status: DC | PRN
  Filled 2022-06-10: qty 177

## 2022-06-10 MED ORDER — OSELTAMIVIR PHOSPHATE 75 MG PO CAPS
75.0000 mg | ORAL_CAPSULE | Freq: Two times a day (BID) | ORAL | Status: DC
  Administered 2022-06-10 – 2022-06-13 (×7): 75 mg via ORAL
  Filled 2022-06-10 (×7): qty 1

## 2022-06-10 MED ORDER — MENTHOL 3 MG MT LOZG
1.0000 | LOZENGE | OROMUCOSAL | Status: DC | PRN
  Filled 2022-06-10: qty 9

## 2022-06-10 NOTE — Progress Notes (Signed)
  PROGRESS NOTE    [name]  [id number]:[id number] DOB: [birth year] DOA: 06/06/2022 PCP: Gwyneth Sprout, FNP  128A/128A-AA  LOS: 3 days   Brief hospital course:   Assessment & Plan: [name] is a 47 y.o. female with medical history significant of IBS, presented for the 2nd time with c/o not feeling well since 05/28/2022, and fevers and headache.   Fever of unknown etiology --intermittent high fever up to 102. --had LP on 06/04/22 that was neg.  Extensive workup including CTA chest, CT a/p w contrast were neg for infectious source.  RVP neg.  MRI lumbar spine neg. --labs pending: ASO, Monospot, EBV titers, CMV, Brucella antibody, parvovirus B19, ANA, RF, ANCA  --started on IV doxycycline for presume RMSF (IgM 0.96, equivocal) --ID consulted --had 4 flu test since 06/04/22, was pos this morning.  Maybe the etiology of pt's fever. Plan: --d/c doxy after tonight  Flu A --tested for Flu A 4 times since 06/04/22, and was pos this morning --start Tamiflu --supportive care   Intractable Headache 2/2 rebound --per neuro, likely rebound headache since pt was taking tylenol or ibuprofen q 3 hrs until admission  --has received IV dilaudid, migraine cocktail (IV benadryl with IV phenergan, IV compazine or IV reglan), fioricet, Advil, IV Depacon, IV toradol, IV decadron without much improvement.  ESR and CRP not significantly elevated.  MRI brain and MRV no acute finding. --neuro consulted Plan: --Avoid opioids, Fioricet and Advil per neuro. --hold off on any further depakote given her transaminitis  --re-consult neuro --aggressive IVF hydration, per neuro, with 2L bolus f/b 100 ml/hr for today.  Abnormal liver function --maybe due to excess tylenol use at home. --hepatitis panel neg. --labs pending: ferritin, iron, tibc, ceruloplasmin, alpha 1 antitrypsin Check ANA, anti smooth muscle ab, AMA, Celiac panel  Hypokalemia --monitor and replete PRN   DVT prophylaxis: Lovenox SQ Code  Status: Full code  Family Communication:  Level of care: Med-Surg Dispo:   The patient is from: home Anticipated d/c is to: home Anticipated d/c date is: 1-2 days   Subjective and Interval History:  Complained headache still severe and unchanged.  Had nausea but can eat.  Developed cough and congestion, repeat flu test pos.   Objective: Vitals:   06/10/22 0432 06/10/22 0914 06/10/22 1621 06/10/22 1724  BP: (!) 158/95 (!) 147/91  128/77  Pulse: (!) 49 (!) 54  76  Resp: 18 16  20   Temp: 98.2 F (36.8 C) 98.5 F (36.9 C) (!) 102.4 F (39.1 C) 99.9 F (37.7 C)  TempSrc: Oral     SpO2: 99% 98%  98%  Weight:      Height:        Intake/Output Summary (Last 24 hours) at 06/10/2022 2008 Last data filed at 06/10/2022 1538 Gross per 24 hour  Intake 14522.99 ml  Output --  Net 14522.99 ml   Filed Weights   06/06/22 1255 06/07/22 2330 06/08/22 0500  Weight: 88.9 kg 89.4 kg 88.8 kg    Examination:   Constitutional: NAD, AAOx3 HEENT: conjunctivae and lids normal, EOMI CV: No cyanosis.   RESP: normal respiratory effort, on RA Neuro: II - XII grossly intact.   Psych: subdued mood and affect.     Data Reviewed: I have personally reviewed labs and imaging studies  Time spent: 50 minutes  Enzo Bi, MD [facility] If 7PM-7AM, please contact night-coverage 06/10/2022, 8:08 PM

## 2022-06-10 NOTE — Progress Notes (Signed)
Date of Admission:  06/06/2022     ID: [name] is a 47 y.o. female Principal Problem:   Fever Active Problems:   Headache   Abnormal liver function   Fever, unknown origin    Subjective: No fever but had received 24 mg of decadron and also Ketoralac She is tested positive for Flu A She still has headache Cough +   Medications:   enoxaparin (LOVENOX) injection  0.5 mg/kg Subcutaneous Q24H   FLUoxetine  40 mg Oral Daily   oseltamivir  75 mg Oral BID    Objective: Vital signs in last 24 hours: Temp:  [97.9 F (36.6 C)-98.5 F (36.9 C)] 98.5 F (36.9 C) (01/08 0914) Pulse Rate:  [49-57] 54 (01/08 0914) Resp:  [16-18] 16 (01/08 0914) BP: (145-162)/(80-97) 147/91 (01/08 0914) SpO2:  [97 %-99 %] 98 % (01/08 0914)    PHYSICAL EXAM:  General: Alert, cooperative, no distress, appears stated age.  Neck: Supple, symmetrical, no adenopathy, thyroid: non tender no carotid bruit and no JVD. Back: No CVA tenderness. Lungs: Clear to auscultation bilaterally. No Wheezing or Rhonchi. No rales. Heart: Regular rate and rhythm, no murmur, rub or gallop. Abdomen: Soft, non-tender,not distended. Bowel sounds normal. No masses Extremities: atraumatic, no cyanosis. No edema. No clubbing Skin: No rashes or lesions. Or bruising Lymph: Cervical, supraclavicular normal. Neurologic: Grossly non-focal  Lab Results Recent Labs    06/09/22 0347 06/10/22 0235  WBC 5.1 7.1  HGB 11.6* 11.8*  HCT 34.0* 35.2*  NA 138 138  K 3.6 4.0  CL 105 105  CO2 24 25  BUN 6 8  CREATININE 0.78 0.67   Liver Panel Recent Labs    06/09/22 0343 06/10/22 0232  PROT 6.2* 6.6  ALBUMIN 2.9* 3.0*  AST 61* 72*  ALT 185* 170*  ALKPHOS 248* 246*  BILITOT 0.7 0.7  BILIDIR 0.2 0.2  IBILI 0.5 0.5    Microbiology: 06/04/22- CSF culture neg Studies/Results: ECHOCARDIOGRAM COMPLETE  Result Date: 06/08/2022    ECHOCARDIOGRAM REPORT   Patient Name:   [name] Date of Exam: 06/08/2022 Medical  Rec #:  [id number]      Height:       66.0 in Accession #:    [id number]     Weight:       195.0 lb Date of Birth:  13-Apr-1976      BSA:          1.979 m Patient Age:    89 years       BP:           117/71 mmHg Patient Gender: F              HR:           86 bpm. Exam Location:  [facility] Procedure: 2D Echo Indications:     Fever R50.9  History:         Patient has no prior history of Echocardiogram examinations.  Sonographer:     Kathlen Brunswick RDCS Referring Phys:  Tennis Must MD Diagnosing Phys: [place]  1. Left ventricular ejection fraction, by estimation, is 60 to 65%. The left ventricle has normal function. The left ventricle has no regional wall motion abnormalities. Left ventricular diastolic parameters are consistent with Grade I diastolic dysfunction (impaired relaxation).  2. Right ventricular systolic function is normal. The right ventricular size is normal.  3. The mitral valve is normal in structure. Mild mitral valve regurgitation. No evidence of mitral stenosis.  4.  The aortic valve is normal in structure. Aortic valve regurgitation is not visualized. No aortic stenosis is present.  5. The inferior vena cava is normal in size with greater than 50% respiratory variability, suggesting right atrial pressure of 3 mmHg. FINDINGS  Left Ventricle: Left ventricular ejection fraction, by estimation, is 60 to 65%. The left ventricle has normal function. The left ventricle has no regional wall motion abnormalities. The left ventricular internal cavity size was normal in size. There is  no left ventricular hypertrophy. Left ventricular diastolic parameters are consistent with Grade I diastolic dysfunction (impaired relaxation). Right Ventricle: The right ventricular size is normal. No increase in right ventricular wall thickness. Right ventricular systolic function is normal. Left Atrium: Left atrial size was normal in size. Right Atrium: Right atrial size was normal in size. Pericardium: Trivial  pericardial effusion is present. The pericardial effusion is circumferential. Mitral Valve: The mitral valve is normal in structure. Mild mitral valve regurgitation. No evidence of mitral valve stenosis. Tricuspid Valve: The tricuspid valve is normal in structure. Tricuspid valve regurgitation is trivial. No evidence of tricuspid stenosis. Aortic Valve: The aortic valve is normal in structure. Aortic valve regurgitation is not visualized. No aortic stenosis is present. Pulmonic Valve: The pulmonic valve was normal in structure. Pulmonic valve regurgitation is trivial. No evidence of pulmonic stenosis. Aorta: The aortic root is normal in size and structure. Venous: The inferior vena cava is normal in size with greater than 50% respiratory variability, suggesting right atrial pressure of 3 mmHg. IAS/Shunts: No atrial level shunt detected by color flow Doppler.  LEFT VENTRICLE PLAX 2D LVIDd:         3.80 cm   Diastology LVIDs:         2.10 cm   LV e' medial:    7.10 cm/s LV PW:         0.80 cm   LV E/e' medial:  9.3 LV IVS:        1.00 cm   LV e' lateral:   5.30 cm/s LVOT diam:     1.60 cm   LV E/e' lateral: 12.4 LV SV:         45 LV SV Index:   23 LVOT Area:     2.01 cm  RIGHT VENTRICLE RV Basal diam:  2.90 cm TAPSE (M-mode): 2.3 cm LEFT ATRIUM           Index LA diam:      3.15 cm 1.59 cm/m LA Vol (A2C): 19.1 ml 9.65 ml/m LA Vol (A4C): 39.2 ml 19.81 ml/m  AORTIC VALVE             PULMONIC VALVE LVOT Vmax:   122.00 cm/s PV Vmax:       1.52 m/s LVOT Vmean:  79.700 cm/s PV Peak grad:  9.2 mmHg LVOT VTI:    0.225 m  MV E velocity: 65.80 cm/s                            SHUNTS                            Systemic VTI:  0.22 m                            Systemic Diam: 1.60 cm Adrian Blackwater Electronically signed by Adrian Blackwater Signature Date/Time: 06/08/2022/3:51:14 PM  Final      Assessment/Plan:  Fever due to viral illness- none in >48  hrs Influenza A- started Tamiflu RMSF unlikely and fever response is likely  due to Decadron 24 mg than Doxy- so will DC doxy after tonight  Refractory headache is the issue- I think there is an element of rebound headache due to NSAID  Abnormal LFTS improving  Discussed the management with patient and hospitalist

## 2022-06-10 NOTE — Progress Notes (Signed)
   06/10/22 1621  Assess: MEWS Score  Temp (!) 102.4 F (39.1 C)  Assess: MEWS Score  MEWS Temp 2  MEWS Systolic 0  MEWS Pulse 0  MEWS RR 0  MEWS LOC 0  MEWS Score 2  MEWS Score Color Yellow  Assess: if the MEWS score is Yellow or Red  Were vital signs taken at a resting state? Yes  Focused Assessment Change from prior assessment (see assessment flowsheet)  Does the patient meet 2 or more of the SIRS criteria? No  MEWS guidelines implemented *See Row Information* Yes  Treat  MEWS Interventions Administered prn meds/treatments  Take Vital Signs  Increase Vital Sign Frequency  Yellow: Q 2hr X 2 then Q 4hr X 2, if remains yellow, continue Q 4hrs  Escalate  MEWS: Escalate Yellow: discuss with charge nurse/RN and consider discussing with provider and RRT  Notify: Charge Nurse/RN  Name of Charge Nurse/RN Notified Montey Hora, RN  Date Charge Nurse/RN Notified 06/10/22  Time Charge Nurse/RN Notified 1622  Provider Notification  Provider Name/Title Dr. Billie Ruddy  Date Provider Notified 06/10/22  Time Provider Notified 1622  Method of Notification  (secure message)  Notification Reason Change in status  Provider response Other (Comment) (gave PRN cont to monitor)  Date of Provider Response 06/10/22  Time of Provider Response 1624  Document  Patient Outcome Stabilized after interventions  Progress note created (see row info) Yes  Assess: SIRS CRITERIA  SIRS Temperature  1  SIRS Pulse 0  SIRS Respirations  0  SIRS WBC 0  SIRS Score Sum  1

## 2022-06-10 NOTE — Progress Notes (Signed)
  Transition of Care (TOC) Screening Note   Patient Details  Name: [name] Date of Birth: 1975-09-20   Transition of Care [facility]) CM/SW Contact:    Quin Hoop, LCSW Phone Number: 06/10/2022, 10:22 AM    Transition of Care Department [facility]) has reviewed patient and no TOC needs have been identified at this time. We will continue to monitor patient advancement through interdisciplinary progression rounds. If new patient transition needs arise, please place a TOC consult.

## 2022-06-11 DIAGNOSIS — R509 Fever, unspecified: Secondary | ICD-10-CM | POA: Diagnosis not present

## 2022-06-11 DIAGNOSIS — R519 Headache, unspecified: Secondary | ICD-10-CM | POA: Diagnosis not present

## 2022-06-11 LAB — MAGNESIUM: Magnesium: 1.7 mg/dL (ref 1.7–2.4)

## 2022-06-11 LAB — BASIC METABOLIC PANEL
Anion gap: 7 (ref 5–15)
BUN: 6 mg/dL (ref 6–20)
CO2: 24 mmol/L (ref 22–32)
Calcium: 7.8 mg/dL — ABNORMAL LOW (ref 8.9–10.3)
Chloride: 108 mmol/L (ref 98–111)
Creatinine, Ser: 0.74 mg/dL (ref 0.44–1.00)
GFR, Estimated: >60 mL/min (ref >60)
Glucose, Bld: 90 mg/dL (ref 70–99)
Potassium: 3.5 mmol/L (ref 3.5–5.1)
Sodium: 139 mmol/L (ref 135–145)

## 2022-06-11 LAB — CBC
HCT: 32.9 % — ABNORMAL LOW (ref 36.0–46.0)
Hemoglobin: 10.8 g/dL — ABNORMAL LOW (ref 12.0–15.0)
MCH: 30.9 pg (ref 26.0–34.0)
MCHC: 32.8 g/dL (ref 30.0–36.0)
MCV: 94 fL (ref 80.0–100.0)
Platelets: 211 10*3/uL (ref 150–400)
RBC: 3.5 MIL/uL — ABNORMAL LOW (ref 3.87–5.11)
RDW: 12.2 % (ref 11.5–15.5)
WBC: 6.9 10*3/uL (ref 4.0–10.5)
nRBC: 0 % (ref 0.0–0.2)

## 2022-06-11 LAB — ANA W/REFLEX: Anti Nuclear Antibody (ANA): NEGATIVE

## 2022-06-11 LAB — CMV DNA, QUANTITATIVE, PCR
CMV DNA Quant: POSITIVE IU/mL
Log10 CMV Qn DNA Pl: UNDETERMINED log10 IU/mL

## 2022-06-11 LAB — HCV RNA QUANT: HCV Quantitative: NOT DETECTED IU/mL (ref >50)

## 2022-06-11 MED ORDER — SODIUM CHLORIDE 0.9 % IV BOLUS
2000.0000 mL | Freq: Once | INTRAVENOUS | Status: AC
  Administered 2022-06-11: 2000 mL via INTRAVENOUS

## 2022-06-11 MED ORDER — DIPHENHYDRAMINE HCL 50 MG/ML IJ SOLN
25.0000 mg | Freq: Four times a day (QID) | INTRAMUSCULAR | Status: DC | PRN
  Administered 2022-06-11 (×2): 25 mg via INTRAVENOUS
  Filled 2022-06-11 (×2): qty 1

## 2022-06-11 MED ORDER — PROCHLORPERAZINE EDISYLATE 10 MG/2ML IJ SOLN
10.0000 mg | Freq: Four times a day (QID) | INTRAMUSCULAR | Status: DC | PRN
  Administered 2022-06-11 (×2): 10 mg via INTRAVENOUS
  Filled 2022-06-11 (×2): qty 2

## 2022-06-11 MED ORDER — POLYETHYLENE GLYCOL 3350 17 G PO PACK
17.0000 g | PACK | Freq: Two times a day (BID) | ORAL | Status: DC
  Administered 2022-06-11 – 2022-06-13 (×5): 17 g via ORAL
  Filled 2022-06-11 (×5): qty 1

## 2022-06-11 MED ORDER — COSYNTROPIN 0.25 MG IJ SOLR
0.2500 mg | Freq: Once | INTRAMUSCULAR | Status: AC
  Administered 2022-06-12: 0.25 mg via INTRAVENOUS
  Filled 2022-06-11: qty 0.25

## 2022-06-11 MED ORDER — MAGNESIUM SULFATE 2 GM/50ML IV SOLN
2.0000 g | Freq: Once | INTRAVENOUS | Status: AC
  Administered 2022-06-11: 2 g via INTRAVENOUS
  Filled 2022-06-11: qty 50

## 2022-06-11 NOTE — Progress Notes (Signed)
PROGRESS NOTE    [name]  RSW:[id number] DOB: 10/14/1975 DOA: 06/06/2022 PCP: [name], FNP  128A/128A-AA  LOS: 4 days   Brief hospital course:   Assessment & Plan: [name] is a 47 y.o. female with medical history significant of IBS, presented for the 2nd time with c/o not feeling well since 05/28/2022, and fevers and headache.   Fever of unknown etiology --intermittent high fever up to 103. --had LP on 06/04/22 was neg.  Extensive workup including CTA chest, CT a/p w contrast were neg for infectious source.  RVP neg.  MRI lumbar spine neg. --labs pending: ASO, Monospot, EBV titers, CMV, Brucella antibody, parvovirus B19, ANA, RF, ANCA  --started on IV doxycycline for presume RMSF (IgM 0.96, equivocal), already d/c'ed. --ID consulted --had 4 flu test since 06/04/22, was pos on 1/8.  Maybe the etiology of pt's fever. --Due to low morning cortisol level, ID suggested workup for adrenal insufficiency as cause of fever.   Plan: --order cosyntropin stim test for tomorrow morning; instructions given to night RN  Flu A --tested for Flu A 4 times since 06/04/22, and was only pos on 1/8. --cont Tamiflu --supportive care   Intractable Headache 2/2 rebound --per neuro, likely rebound headache since pt was taking tylenol or ibuprofen q 3 hrs until admission  --has received IV dilaudid, migraine cocktail (IV benadryl with IV phenergan, IV compazine or IV reglan), fioricet, Advil, IV Depacon, IV toradol, IV decadron without much improvement.  ESR and CRP not significantly elevated.  MRI brain and MRV no acute finding. --neuro consulted Plan: --Avoid opioids, Fioricet and NASIDs per neuro. --hold off on any further depakote given her transaminitis  --cont aggressive IVF hydration for day 2, per neuro, with 2L bolus daily f/b 100 ml/hr.  Abnormal liver function --maybe due to excess tylenol use at home. --hepatitis panel neg. --labs pending: ferritin, iron, tibc, ceruloplasmin,  alpha 1 antitrypsin Check ANA, anti smooth muscle ab, AMA, Celiac panel  Hypokalemia --monitor and replete PRN   DVT prophylaxis: Lovenox SQ Code Status: Full code  Family Communication:  Level of care: Med-Surg Dispo:   The patient is from: home Anticipated d/c is to: home Anticipated d/c date is: 1-2 days.  Still having high fevers now pending cosyntropin test.   Subjective and Interval History:  Pt reported headache improved today.    Had fever up to 103.1 around midnight.  Due to low morning cortisol level, ID suggested workup for adrenal insufficiency as cause of fever.     Objective: Vitals:   06/11/22 0453 06/11/22 0500 06/11/22 0808 06/11/22 1708  BP: 125/73  126/72 (!) 150/94  Pulse: 62  72 63  Resp: 20  16 16   Temp: 99 F (37.2 C)  99.3 F (37.4 C) 98.2 F (36.8 C)  TempSrc: Oral  Oral Oral  SpO2: 96%  97% 100%  Weight:  92.4 kg    Height:        Intake/Output Summary (Last 24 hours) at 06/11/2022 1933 Last data filed at 06/11/2022 1138 Gross per 24 hour  Intake 2047.06 ml  Output --  Net 2047.06 ml   Filed Weights   06/07/22 2330 06/08/22 0500 06/11/22 0500  Weight: 89.4 kg 88.8 kg 92.4 kg    Examination:   Constitutional: NAD, AAOx3 HEENT: conjunctivae and lids normal, EOMI CV: No cyanosis.   RESP: normal respiratory effort, on RA Neuro: II - XII grossly intact.     Data Reviewed: I have personally reviewed labs and  imaging studies  Time spent: 35 minutes  Enzo Bi, MD [facility] If 7PM-7AM, please contact night-coverage 06/11/2022, 7:33 PM

## 2022-06-11 NOTE — Progress Notes (Signed)
   06/11/22 0110  Assess: MEWS Score  Temp (!) 103.1 F (39.5 C)  BP 136/80  MAP (mmHg) 97  Pulse Rate 85  Resp 19  O2 Device Room Air  Assess: MEWS Score  MEWS Temp 2  MEWS Systolic 0  MEWS Pulse 0  MEWS RR 0  MEWS LOC 0  MEWS Score 2  MEWS Score Color Yellow  Assess: if the MEWS score is Yellow or Red  Were vital signs taken at a resting state? Yes  Focused Assessment Change from prior assessment (see assessment flowsheet)  Does the patient meet 2 or more of the SIRS criteria? No  MEWS guidelines implemented *See Row Information* Yes  Treat  MEWS Interventions Administered prn meds/treatments  Pain Scale 0-10  Pain Score 6  Pain Type Acute pain  Pain Location Head  Pain Orientation Mid  Pain Descriptors / Indicators Headache  Pain Intervention(s) Medication (See eMAR)  Take Vital Signs  Increase Vital Sign Frequency  Yellow: Q 2hr X 2 then Q 4hr X 2, if remains yellow, continue Q 4hrs  Escalate  MEWS: Escalate Yellow: discuss with charge nurse/RN and consider discussing with provider and RRT  Notify: Charge Nurse/RN  Name of Charge Nurse/RN Notified Earlie Server, RN  Date Charge Nurse/RN Notified 06/11/22  Time Charge Nurse/RN Notified 0113  Provider Notification  Provider Name/Title Neomia Glass  Date Provider Notified 06/11/22  Time Provider Notified 0117  Method of Notification  (secure chat)  Notification Reason Change in status  Provider response No new orders (awaiting response prn meds given)  Date of Provider Response 06/11/22  Time of Provider Response 0125  Assess: SIRS CRITERIA  SIRS Temperature  1  SIRS Pulse 0  SIRS Respirations  0  SIRS WBC 0  SIRS Score Sum  1   Will continue to monitor per MEWS protocol

## 2022-06-11 NOTE — Progress Notes (Signed)
Neurology Progress Note   S:// Patient seen and examined.  Spiked a fever again last night. Reports headache improved from presentation.  Reports headache noted to be only very frontal and worsening headache with coughing.  O:// Current vital signs: BP 126/72 (BP Location: Right Arm)   Pulse 72   Temp 99.3 F (37.4 C) (Oral)   Resp 16   Ht 5\' 6"  (1.676 m)   Wt 92.4 kg   SpO2 97%   BMI 32.88 kg/m  Vital signs in last 24 hours: Temp:  [99 F (37.2 C)-103.1 F (39.5 C)] 99.3 F (37.4 C) (01/09 0808) Pulse Rate:  [62-85] 72 (01/09 0808) Resp:  [16-20] 16 (01/09 0808) BP: (110-148)/(69-86) 126/72 (01/09 0808) SpO2:  [96 %-98 %] 97 % (01/09 0808) Weight:  [92.4 kg] 92.4 kg (01/09 0500) Awake alert in no distress Normocephalic atraumatic Clear chest Normal heart sounds Neurological exam Awake alert oriented x 3, no evidence of dysarthria or aphasia, cranial nerves II to XII intact Motor examination with no drift or weakness, sensation intact light touch, coordination intact.  Medications  Current Facility-Administered Medications:    0.9 %  sodium chloride infusion, , Intravenous, Continuous, Enzo Bi, MD, Last Rate: 100 mL/hr at 06/11/22 1030, New Bag at 06/11/22 1030   acetaminophen (TYLENOL) tablet 1,000 mg, 1,000 mg, Oral, TID PRN, Enzo Bi, MD, 1,000 mg at 06/11/22 1962   benzonatate (TESSALON) capsule 100 mg, 100 mg, Oral, TID PRN, Enzo Bi, MD, 100 mg at 06/10/22 2129   chlorpheniramine-HYDROcodone ([place]) 10-8 MG/5ML suspension 5 mL, 5 mL, Oral, Q12H PRN, Enzo Bi, MD, 5 mL at 06/11/22 0819   dextromethorphan-guaiFENesin ([place] [facility]) 30-600 MG per 12 hr tablet 1 tablet, 1 tablet, Oral, BID, Enzo Bi, MD, 1 tablet at 06/11/22 1003   diphenhydrAMINE (BENADRYL) injection 25 mg, 25 mg, Intravenous, Q6H PRN, Enzo Bi, MD, 25 mg at 06/10/22 2126   enoxaparin (LOVENOX) injection 45 mg, 0.5 mg/kg, Subcutaneous, Q24H, Jani Gravel, MD, 45 mg at 06/10/22 2130    FLUoxetine (PROZAC) capsule 40 mg, 40 mg, Oral, Daily, Enzo Bi, MD, 40 mg at 06/11/22 1002   ketorolac (TORADOL) 15 MG/ML injection 15 mg, 15 mg, Intravenous, Q8H PRN, Enzo Bi, MD, 15 mg at 06/11/22 1026   menthol-cetylpyridinium (CEPACOL) lozenge 3 mg, 1 lozenge, Oral, PRN, Enzo Bi, MD   ondansetron [facility]) tablet 4 mg, 4 mg, Oral, Q6H PRN, 4 mg at 06/07/22 0952 **OR** ondansetron (ZOFRAN) injection 4 mg, 4 mg, Intravenous, Q6H PRN, Jani Gravel, MD, 4 mg at 06/09/22 1700   oseltamivir (TAMIFLU) capsule 75 mg, 75 mg, Oral, BID, Enzo Bi, MD, 75 mg at 06/11/22 1002   phenol (CHLORASEPTIC) mouth spray 1 spray, 1 spray, Mouth/Throat, PRN, Enzo Bi, MD   polyethylene glycol (MIRALAX / GLYCOLAX) packet 17 g, 17 g, Oral, BID, Enzo Bi, MD, 17 g at 06/11/22 1242   prochlorperazine (COMPAZINE) injection 10 mg, 10 mg, Intravenous, Q6H PRN, Enzo Bi, MD, 10 mg at 06/10/22 2126   traZODone (DESYREL) tablet 100-200 mg, 100-200 mg, Oral, QHS PRN, Enzo Bi, MD, 200 mg at 06/10/22 2129 Labs CBC    Component Value Date/Time   WBC 6.9 06/11/2022 0458   RBC 3.50 (L) 06/11/2022 0458   HGB 10.8 (L) 06/11/2022 0458   HGB 14.6 09/03/2021 1547   HCT 32.9 (L) 06/11/2022 0458   HCT 41.6 09/03/2021 1547   PLT 211 06/11/2022 0458   PLT 312 09/03/2021 1547   MCV 94.0 06/11/2022 0458   MCV  93 09/03/2021 1547   MCV 91 05/14/2014 2239   MCH 30.9 06/11/2022 0458   MCHC 32.8 06/11/2022 0458   RDW 12.2 06/11/2022 0458   RDW 11.8 09/03/2021 1547   RDW 12.7 05/14/2014 2239   LYMPHSABS 1.8 06/06/2022 1334   LYMPHSABS 1.6 09/03/2021 1547   LYMPHSABS 1.4 10/23/2013 1022   MONOABS 0.5 06/06/2022 1334   MONOABS 0.7 10/23/2013 1022   EOSABS 0.3 06/06/2022 1334   EOSABS 0.1 09/03/2021 1547   EOSABS 0.1 10/23/2013 1022   BASOSABS 0.1 06/06/2022 1334   BASOSABS 0.1 09/03/2021 1547   BASOSABS 0.1 10/23/2013 1022    CMP     Component Value Date/Time   NA 139 06/11/2022 0458   NA 136 03/15/2022 0923   NA  139 05/14/2014 2239   K 3.5 06/11/2022 0458   K 3.5 05/14/2014 2239   CL 108 06/11/2022 0458   CL 108 (H) 05/14/2014 2239   CO2 24 06/11/2022 0458   CO2 22 05/14/2014 2239   GLUCOSE 90 06/11/2022 0458   GLUCOSE 129 (H) 05/14/2014 2239   BUN 6 06/11/2022 0458   BUN 9 03/15/2022 0923   BUN 6 (L) 05/14/2014 2239   CREATININE 0.74 06/11/2022 0458   CREATININE 1.10 05/14/2014 2239   CALCIUM 7.8 (L) 06/11/2022 0458   CALCIUM 8.4 (L) 05/14/2014 2239   PROT 6.6 06/10/2022 0232   PROT 7.2 03/15/2022 0923   ALBUMIN 3.0 (L) 06/10/2022 0232   ALBUMIN 4.7 03/15/2022 0923   AST 72 (H) 06/10/2022 0232   ALT 170 (H) 06/10/2022 0232   ALKPHOS 246 (H) 06/10/2022 0232   BILITOT 0.7 06/10/2022 0232   BILITOT <0.2 03/15/2022 0923   GFRNONAA >60 06/11/2022 0458   GFRNONAA 60 (L) 05/14/2014 2239   GFRNONAA >60 10/23/2013 1022   GFRAA 89 03/29/2020 1518   GFRAA >60 05/14/2014 2239   GFRAA >60 10/23/2013 1022   Influenza positive  CSF results not suggestive of CNS infection.  Imaging I have reviewed images in epic and the results pertinent to this consultation are: MR brain and lumbar spine without any evidence of acute process.  Lumbar spine MRI has mild thecal sac narrowing at L2-3 and L3-4 secondary to prominent epidural fat. MRV negative for dural venous sinus thrombus  Assessment: Intractable headache likely medication overuse headache, in the setting of underlying viral or other systemic illness.  Recommendations: She has received multiple headache cocktails and medications. According to her, things that have helped her since yesterday have been large amounts of fluid boluses. I would recommend continuing about 2 L of fluid boluses in addition to maintenance fluids for her for today. Etiology for fever remains unclear but she also has positive for flu now which may be causing the headache. At this point, I would recommend diligent lowering of NSAIDs along with using Compazine as needed  as well as Benadryl that can be used at night which may aid in sleep and eventually help with managing the headaches. I am hoping she will feel better as her viral process resolves. Plan relayed to Dr. Fran Lowes   -- Milon Dikes, MD Neurologist [facility] Pager: [phone]

## 2022-06-12 DIAGNOSIS — J101 Influenza due to other identified influenza virus with other respiratory manifestations: Secondary | ICD-10-CM | POA: Diagnosis not present

## 2022-06-12 DIAGNOSIS — R509 Fever, unspecified: Secondary | ICD-10-CM | POA: Diagnosis not present

## 2022-06-12 DIAGNOSIS — R519 Headache, unspecified: Secondary | ICD-10-CM | POA: Diagnosis not present

## 2022-06-12 LAB — HEPATIC FUNCTION PANEL
ALT: 178 U/L — ABNORMAL HIGH (ref 0–44)
AST: 108 U/L — ABNORMAL HIGH (ref 15–41)
Albumin: 3 g/dL — ABNORMAL LOW (ref 3.5–5.0)
Alkaline Phosphatase: 279 U/L — ABNORMAL HIGH (ref 38–126)
Bilirubin, Direct: <0.1 mg/dL (ref 0.0–0.2)
Total Bilirubin: 0.3 mg/dL (ref 0.3–1.2)
Total Protein: 6.2 g/dL — ABNORMAL LOW (ref 6.5–8.1)

## 2022-06-12 LAB — ANCA PROFILE
Anti-MPO Antibodies: <0.2 units (ref 0.0–0.9)
Anti-PR3 Antibodies: <0.2 units (ref 0.0–0.9)
Atypical P-ANCA titer: <1:20 {titer}
C-ANCA: <1:20 {titer}
P-ANCA: <1:20 {titer}

## 2022-06-12 LAB — CBC
HCT: 35.3 % — ABNORMAL LOW (ref 36.0–46.0)
Hemoglobin: 11.5 g/dL — ABNORMAL LOW (ref 12.0–15.0)
MCH: 31 pg (ref 26.0–34.0)
MCHC: 32.6 g/dL (ref 30.0–36.0)
MCV: 95.1 fL (ref 80.0–100.0)
Platelets: 214 10*3/uL (ref 150–400)
RBC: 3.71 MIL/uL — ABNORMAL LOW (ref 3.87–5.11)
RDW: 12.3 % (ref 11.5–15.5)
WBC: 6 10*3/uL (ref 4.0–10.5)
nRBC: 0 % (ref 0.0–0.2)

## 2022-06-12 LAB — MITOCHONDRIAL ANTIBODIES: Mitochondrial M2 Ab, IgG: <20 Units (ref 0.0–20.0)

## 2022-06-12 LAB — ACTH STIMULATION, 3 TIME POINTS
Cortisol, 30 Min: 25.9 ug/dL
Cortisol, 60 Min: 31.9 ug/dL
Cortisol, Base: 9.8 ug/dL

## 2022-06-12 LAB — BASIC METABOLIC PANEL
Anion gap: 7 (ref 5–15)
BUN: 5 mg/dL — ABNORMAL LOW (ref 6–20)
CO2: 25 mmol/L (ref 22–32)
Calcium: 8 mg/dL — ABNORMAL LOW (ref 8.9–10.3)
Chloride: 107 mmol/L (ref 98–111)
Creatinine, Ser: 0.59 mg/dL (ref 0.44–1.00)
GFR, Estimated: >60 mL/min (ref >60)
Glucose, Bld: 88 mg/dL (ref 70–99)
Potassium: 4.1 mmol/L (ref 3.5–5.1)
Sodium: 139 mmol/L (ref 135–145)

## 2022-06-12 LAB — RHEUMATOID FACTOR: Rheumatoid fact SerPl-aCnc: 13.2 IU/mL (ref <14.0)

## 2022-06-12 LAB — MAGNESIUM: Magnesium: 2.3 mg/dL (ref 1.7–2.4)

## 2022-06-12 LAB — CELIAC DISEASE PANEL
Endomysial Ab, IgA: NEGATIVE
IgA: 192 mg/dL (ref 87–352)
Tissue Transglutaminase Ab, IgA: <2 U/mL (ref 0–3)

## 2022-06-12 LAB — ANTISTREPTOLYSIN O TITER: ASO: 390 IU/mL — ABNORMAL HIGH (ref 0.0–200.0)

## 2022-06-12 LAB — CERULOPLASMIN: Ceruloplasmin: 36.8 mg/dL (ref 19.0–39.0)

## 2022-06-12 LAB — ALPHA-1-ANTITRYPSIN: A-1 Antitrypsin, Ser: 173 mg/dL (ref 101–187)

## 2022-06-12 LAB — ANTI-SMOOTH MUSCLE ANTIBODY, IGG: F-Actin IgG: 3 Units (ref 0–19)

## 2022-06-12 MED ORDER — ALUM & MAG HYDROXIDE-SIMETH 200-200-20 MG/5ML PO SUSP
30.0000 mL | ORAL | Status: DC | PRN
  Administered 2022-06-12: 30 mL via ORAL
  Filled 2022-06-12: qty 30

## 2022-06-12 MED ORDER — PANTOPRAZOLE SODIUM 40 MG PO TBEC
40.0000 mg | DELAYED_RELEASE_TABLET | Freq: Every day | ORAL | Status: DC
  Administered 2022-06-12 – 2022-06-13 (×2): 40 mg via ORAL
  Filled 2022-06-12 (×2): qty 1

## 2022-06-12 NOTE — Progress Notes (Signed)
   Date of Admission:  06/06/2022     ID: [name] is a 47 y.o. female Principal Problem:   Fever Active Problems:   Headache   Abnormal liver function   Fever, unknown origin    Subjective: Doing better Headache much improved Fever better   Medications:   dextromethorphan-guaiFENesin  1 tablet Oral BID   enoxaparin (LOVENOX) injection  0.5 mg/kg Subcutaneous Q24H   FLUoxetine  40 mg Oral Daily   oseltamivir  75 mg Oral BID   polyethylene glycol  17 g Oral BID    Objective: Vital signs in last 24 hours: Patient Vitals for the past 24 hrs:  BP Temp Temp src Pulse Resp SpO2 Weight  06/12/22 2037 134/76 99.2 F (37.3 C) Oral 78 18 95 % --  06/12/22 1806 (!) 148/93 98.1 F (36.7 C) Oral 69 17 97 % --  06/12/22 0812 (!) 148/88 99.3 F (37.4 C) Oral 76 16 95 % --  06/12/22 0617 (!) 150/97 100.3 F (37.9 C) -- 72 20 95 % --  06/12/22 0500 -- -- -- -- -- -- 91.4 kg     PHYSICAL EXAM:  General: Alert, cooperative, no distress, appears stated age.  Neck: Supple, symmetrical, no adenopathy, thyroid: non tender no carotid bruit and no JVD. Back: No CVA tenderness. Lungs: Clear to auscultation bilaterally. No Wheezing or Rhonchi. No rales. Heart: Regular rate and rhythm, no murmur, rub or gallop. Abdomen: Soft, non-tender,not distended. Bowel sounds normal. No masses Extremities: atraumatic, no cyanosis. No edema. No clubbing Skin: No rashes or lesions. Or bruising Lymph: Cervical, supraclavicular normal. Neurologic: Grossly non-focal  Lab Results    Latest Ref Rng & Units 06/12/2022    4:01 AM 06/11/2022    4:58 AM 06/10/2022    2:35 AM  CBC  WBC 4.0 - 10.5 K/uL 6.0  6.9  7.1   Hemoglobin 12.0 - 15.0 g/dL 11.5  10.8  11.8   Hematocrit 36.0 - 46.0 % 35.3  32.9  35.2   Platelets 150 - 400 K/uL 214  211  246        Latest Ref Rng & Units 06/12/2022    4:01 AM 06/11/2022    4:58 AM 06/10/2022    2:35 AM  CMP  Glucose 70 - 99 mg/dL 88  90  125   BUN 6 - 20 mg/dL 5  6   8    Creatinine 0.44 - 1.00 mg/dL 0.59  0.74  0.67   Sodium 135 - 145 mmol/L 139  139  138   Potassium 3.5 - 5.1 mmol/L 4.1  3.5  4.0   Chloride 98 - 111 mmol/L 107  108  105   CO2 22 - 32 mmol/L 25  24  25    Calcium 8.9 - 10.3 mg/dL 8.0  7.8  8.5   Total Protein 6.5 - 8.1 g/dL 6.2     Total Bilirubin 0.3 - 1.2 mg/dL 0.3     Alkaline Phos 38 - 126 U/L 279     AST 15 - 41 U/L 108     ALT 0 - 44 U/L 178         Microbiology: 06/04/22- CSF culture neg  Assessment/Plan:  Fever due to viral illness- Influenza A- on  Tamiflu X 5 days-- fever is much better Refractory headache  much better  Abnormal LFTS improving  Discussed the management with patient and hospitalist ID will sign off- call if needed

## 2022-06-12 NOTE — Progress Notes (Signed)
There was a consult for placement PIV access. Patient was c/o too many sticks on both arm, now hurting a PIV access on Lt arm. She didn't want to stick and she might go home today. Assessed MAR, there is IV fluid only and prn IV benadryl. Patient talk she doesn't have any problem to drink water or eat food. Informed MD and patient's RN regarding this matter. Dr. Roosevelt Locks is okay without a PIV access now. HS [employer]

## 2022-06-12 NOTE — Progress Notes (Signed)
  Progress Note   Patient: [name] QBH:[id number] DOB: 08-Jul-1975 DOA: 06/06/2022     5 DOS: the patient was seen and examined on 06/12/2022   Brief hospital course: Yamina Lenis is a 47 y.o. female with medical history significant of IBS, presented for the 2nd time with c/o not feeling well since 05/28/2022, and fevers and headache.  Patient has been seen by neurology, ID.  LP was performed on 1/2, study negative for infection.  CT angiogram of the chest, CT abdomen pelvis were also negative for infectious source.  MRI lumbar spine was negative. Patient had tested for flu x 4, finally came back positive on 1/8. Patient condition appears to be improving, Tamiflu started.  Assessment and Plan: Fever, most likely secondary to influenza A. Intractable headaches most likely secondary to sinus congestion from influenza. Patient condition is improving, still had a low-grade fever last night, will keep patient for another day until tomorrow.  If no fever, patient will be discharged home with completion of Tamiflu.  Liver function changes. Negative hepatitis panel.  Hypokalemia. Improved.     Subjective:  Patient feels  much better today, suddenly headache has resolved.  Low-grade fever last night, no shortness of breath.  Physical Exam: Vitals:   06/11/22 2039 06/12/22 0500 06/12/22 0617 06/12/22 0812  BP: (!) 145/84  (!) 150/97 (!) 148/88  Pulse: 73  72 76  Resp: 19  20 16   Temp: (!) 100.8 F (38.2 C)  100.3 F (37.9 C) 99.3 F (37.4 C)  TempSrc: Oral   Oral  SpO2: 99%  95% 95%  Weight:  91.4 kg    Height:       General exam: Appears calm and comfortable  Respiratory system: Clear to auscultation. Respiratory effort normal. Cardiovascular system: S1 & S2 heard, RRR. No JVD, murmurs, rubs, gallops or clicks. No pedal edema. Gastrointestinal system: Abdomen is nondistended, soft and nontender. No organomegaly or masses felt. Normal bowel sounds heard. Central nervous  system: Alert and oriented. No focal neurological deficits. Extremities: Symmetric 5 x 5 power. Skin: No rashes, lesions or ulcers Psychiatry: Judgement and insight appear normal. Mood & affect appropriate.   Data Reviewed:  Reviewed lab results.  Family Communication: None  Disposition: Status is: Inpatient Remains inpatient appropriate because: Severity of disease.  Planned Discharge Destination: Home    Time spent: 35 minutes  Author: Sharen Hones, MD 06/12/2022 3:24 PM  For on call review [email or web address].

## 2022-06-12 NOTE — Hospital Course (Signed)
[name] is a 47 y.o. female with medical history significant of IBS, presented for the 2nd time with c/o not feeling well since 05/28/2022, and fevers and headache.  Patient has been seen by neurology, ID.  LP was performed on 1/2, study negative for infection.  CT angiogram of the chest, CT abdomen pelvis were also negative for infectious source.  MRI lumbar spine was negative. Patient had tested for flu x 4, finally came back positive on 1/8. Patient condition appears to be improving, Tamiflu started.

## 2022-06-13 DIAGNOSIS — H01006 Unspecified blepharitis left eye, unspecified eyelid: Secondary | ICD-10-CM | POA: Insufficient documentation

## 2022-06-13 DIAGNOSIS — J101 Influenza due to other identified influenza virus with other respiratory manifestations: Secondary | ICD-10-CM | POA: Diagnosis not present

## 2022-06-13 DIAGNOSIS — R945 Abnormal results of liver function studies: Secondary | ICD-10-CM | POA: Diagnosis not present

## 2022-06-13 DIAGNOSIS — B259 Cytomegaloviral disease, unspecified: Secondary | ICD-10-CM | POA: Insufficient documentation

## 2022-06-13 LAB — EPSTEIN-BARR VIRUS (EBV) ANTIBODY PROFILE
EBV NA IgG: 170 U/mL — ABNORMAL HIGH (ref 0.0–17.9)
EBV VCA IgG: 188 U/mL — ABNORMAL HIGH (ref 0.0–17.9)
EBV VCA IgM: <36 U/mL (ref 0.0–35.9)

## 2022-06-13 LAB — BRUCELLA ANTIBODY IGG, EIA: Brucella Antibody IgG, EIA: NEGATIVE

## 2022-06-13 LAB — BRUCELLA ANTIBODY IGM, EIA: Brucella Antibody IgM, EIA: NEGATIVE

## 2022-06-13 LAB — LYME DISEASE SEROLOGY W/REFLEX: Lyme Total Antibody EIA: NEGATIVE

## 2022-06-13 MED ORDER — BACITRACIN-POLYMYXIN B 500-10000 UNIT/GM OP OINT: TOPICAL_OINTMENT | Freq: Three times a day (TID) | OPHTHALMIC | 0 refills | Status: DC

## 2022-06-13 MED ORDER — BACITRACIN-POLYMYXIN B 500-10000 UNIT/GM OP OINT
TOPICAL_OINTMENT | Freq: Three times a day (TID) | OPHTHALMIC | Status: DC
  Filled 2022-06-13: qty 3.5

## 2022-06-13 MED ORDER — LACTULOSE 10 GM/15ML PO SOLN
20.0000 g | Freq: Once | ORAL | Status: AC
  Administered 2022-06-13: 20 g via ORAL
  Filled 2022-06-13: qty 30

## 2022-06-13 MED ORDER — OSELTAMIVIR PHOSPHATE 75 MG PO CAPS: 75.0000 mg | ORAL_CAPSULE | Freq: Two times a day (BID) | ORAL | 0 refills | Status: AC

## 2022-06-13 NOTE — TOC Transition Note (Signed)
Transition of Care [facility]) - CM/SW Discharge Note   Patient Details  Name: [name] MRN: [id number] Date of Birth: Jun 07, 1975  [facility] CM/SW Contact:  Quin Hoop, LCSW Phone Number: 06/13/2022, 10:38 AM   Clinical Narrative:    Patient discharging home with instructions for follow-up with physician.  TOC signing off.   Final next level of care: Home/Self Care Barriers to Discharge: No Barriers Identified   Patient Goals and CMS Choice      Discharge Placement                         Discharge Plan and Services Additional resources added to the After Visit Summary for                                       Social Determinants of Health (SDOH) Interventions SDOH Screenings   Food Insecurity: No Food Insecurity (06/07/2022)  Housing: Low Risk  (06/07/2022)  Transportation Needs: No Transportation Needs (06/07/2022)  Utilities: Not At Risk (06/07/2022)  Alcohol Screen: Low Risk  (05/09/2022)  Depression (PHQ2-9): Low Risk  (05/09/2022)  Recent Concern: Depression (PHQ2-9) - Medium Risk (03/15/2022)  Financial Resource Strain: High Risk (09/18/2021)  Social Connections: Socially Isolated (09/18/2021)  Tobacco Use: High Risk (06/06/2022)     Readmission Risk Interventions     No data to display

## 2022-06-13 NOTE — Discharge Summary (Addendum)
Physician Discharge Summary   Patient: [name] MRN: [id number] DOB: [birth year]  Admit date:     06/06/2022  Discharge date: 06/13/22  Discharge Physician: Marrion Coyekui Afiya Ferrebee   PCP: Jacky KindlePayne, Elise T, FNP   Recommendations at discharge:   Follow-up with PCP in 1 week. Follow-up with GI in 2 weeks.  Discharge Diagnoses: Principal Problem:   Fever Active Problems:   Headache   Abnormal liver function   Fever, unknown origin   Influenza A   CMV (cytomegalovirus infection) status positive (HCC)   Blepharitis of left eye Obesity with BMI 32.77. Resolved Problems:   * No resolved hospital problems. [facility]*  Hospital Course: [name] is a 47 y.o. female with medical history significant of IBS, presented for the 2nd time with c/o not feeling well since 05/28/2022, and fevers and headache.  Patient has been seen by neurology, ID.  LP was performed on 1/2, study negative for infection.  CT angiogram of the chest, CT abdomen pelvis were also negative for infectious source.  MRI lumbar spine was negative. Patient had tested for flu x 4, finally came back positive on 1/8. Patient condition appears to be improving, Tamiflu started.  Assessment and Plan:  Fever, most likely secondary to influenza A. Intractable headaches most likely secondary to sinus congestion from influenza. Multiple studies are sent out, all negative including hepatitis A, hepatitis B, HCV, parvovirus, COVID test, urine chlamydia and gonorrhea. Patient condition appears to be secondary to influenza A, condition finally improved.  Will complete 5 days of Tamiflu.  Blepharitis of the left eye. Patient has some redness in the left upper eyelid, no visual changes, no pain with eye movement.  Will start antibiotic ointment.  Follow-up with PCP as outpatient.  Liver function changes. Positive CMV. Patient has a liver function just, patient also has positive CMV PCR,  titers less than 200.  Not sure because of the liver  function changes, patient referred to GI as outpatient.   Hypokalemia. Improved.       Consultants: ID Procedures performed: LP  Disposition: Home Diet recommendation:  Discharge Diet Orders (From admission, onward)     Start     Ordered   06/13/22 0000  Diet - low sodium heart healthy        06/13/22 1017           Cardiac diet DISCHARGE MEDICATION: Allergies as of 06/13/2022       Reactions   Codeine Nausea And Vomiting   Sulfa Antibiotics Rash   Vicodin [hydrocodone-acetaminophen] Nausea Only   And itching all over        Medication List     STOP taking these medications    oxyCODONE 5 MG immediate release tablet Commonly known as: Roxicodone       TAKE these medications    bacitracin-polymyxin b ophthalmic ointment Commonly known as: POLYSPORIN Place into the left eye 3 (three) times daily. Bring home the current vial   cyclobenzaprine 10 MG tablet Commonly known as: FLEXERIL Take 1 tablet (10 mg total) by mouth 3 (three) times daily as needed for muscle spasms (neck pain/spasms).   FLUoxetine 40 MG capsule Commonly known as: PROZAC Take 1 capsule (40 mg total) by mouth daily.   hydrOXYzine 25 MG capsule Commonly known as: VISTARIL TAKE 1 CAPSULE BY MOUTH EVERY 8 HOURS AS NEEDED   oseltamivir 75 MG capsule Commonly known as: TAMIFLU Take 1 capsule (75 mg total) by mouth 2 (two) times daily for 2 days.  tiZANidine 4 MG tablet Commonly known as: ZANAFLEX Take 4 mg by mouth every 6 (six) hours.   traZODone 100 MG tablet Commonly known as: DESYREL Take 1-2 tablets PO as needed to assist with sleep   valACYclovir 500 MG tablet Commonly known as: VALTREX Take 2 tablets (1,000 mg total) by mouth 2 (two) times daily.        Follow-up Information     Tally Joe T, FNP Follow up in 1 week(s).   Specialty: Family Medicine Contact information: [address] [place] [address] [phone]         Jonathon Bellows, MD Follow  up in 2 week(s).   Specialty: Gastroenterology Contact information: [place] [address] [phone]                Discharge Exam: Danley Danker Weights   06/11/22 0500 06/12/22 0500 06/13/22 0500  Weight: 92.4 kg 91.4 kg 92.1 kg   General exam: Appears calm and comfortable  Respiratory system: Clear to auscultation. Respiratory effort normal. Cardiovascular system: S1 & S2 heard, RRR. No JVD, murmurs, rubs, gallops or clicks. No pedal edema. Gastrointestinal system: Abdomen is nondistended, soft and nontender. No organomegaly or masses felt. Normal bowel sounds heard. Central nervous system: Alert and oriented. No focal neurological deficits. Extremities: Symmetric 5 x 5 power. Skin: No rashes, lesions or ulcers Psychiatry: Judgement and insight appear normal. Mood & affect appropriate.    Condition at discharge: good  The results of significant diagnostics from this hospitalization (including imaging, microbiology, ancillary and laboratory) are listed below for reference.   Imaging Studies: ECHOCARDIOGRAM COMPLETE  Result Date: 06/08/2022    ECHOCARDIOGRAM REPORT   Patient Name:   Nicolas Sisler Date of Exam: 06/08/2022 Medical Rec #:  [id number]      Height:       66.0 in Accession #:    [id number]     Weight:       195.0 lb Date of Birth:  [birth year]      BSA:          1.979 m Patient Age:    34 years       BP:           117/71 mmHg Patient Gender: F              HR:           86 bpm. Exam Location:  [facility] Procedure: 2D Echo Indications:     Fever R50.9  History:         Patient has no prior history of Echocardiogram examinations.  Sonographer:     Kathlen Brunswick RDCS Referring Phys:  Tennis Must MD Diagnosing Phys: Maxe[place]  1. Left ventricular ejection fraction, by estimation, is 60 to 65%. The left ventricle has normal function. The left ventricle has no regional wall motion abnormalities. Left ventricular diastolic parameters are  consistent with Grade I diastolic dysfunction (impaired relaxation).  2. Right ventricular systolic function is normal. The right ventricular size is normal.  3. The mitral valve is normal in structure. Mild mitral valve regurgitation. No evidence of mitral stenosis.  4. The aortic valve is normal in structure. Aortic valve regurgitation is not visualized. No aortic stenosis is present.  5. The inferior vena cava is normal in size with greater than 50% respiratory variability, suggesting right atrial pressure of 3 mmHg. FINDINGS  Left Ventricle: Left ventricular ejection fraction, by estimation, is 60 to 65%. The left ventricle  has normal function. The left ventricle has no regional wall motion abnormalities. The left ventricular internal cavity size was normal in size. There is  no left ventricular hypertrophy. Left ventricular diastolic parameters are consistent with Grade I diastolic dysfunction (impaired relaxation). Right Ventricle: The right ventricular size is normal. No increase in right ventricular wall thickness. Right ventricular systolic function is normal. Left Atrium: Left atrial size was normal in size. Right Atrium: Right atrial size was normal in size. Pericardium: Trivial pericardial effusion is present. The pericardial effusion is circumferential. Mitral Valve: The mitral valve is normal in structure. Mild mitral valve regurgitation. No evidence of mitral valve stenosis. Tricuspid Valve: The tricuspid valve is normal in structure. Tricuspid valve regurgitation is trivial. No evidence of tricuspid stenosis. Aortic Valve: The aortic valve is normal in structure. Aortic valve regurgitation is not visualized. No aortic stenosis is present. Pulmonic Valve: The pulmonic valve was normal in structure. Pulmonic valve regurgitation is trivial. No evidence of pulmonic stenosis. Aorta: The aortic root is normal in size and structure. Venous: The inferior vena cava is normal in size with greater than 50%  respiratory variability, suggesting right atrial pressure of 3 mmHg. IAS/Shunts: No atrial level shunt detected by color flow Doppler.  LEFT VENTRICLE PLAX 2D LVIDd:         3.80 cm   Diastology LVIDs:         2.10 cm   LV e' medial:    7.10 cm/s LV PW:         0.80 cm   LV E/e' medial:  9.3 LV IVS:        1.00 cm   LV e' lateral:   5.30 cm/s LVOT diam:     1.60 cm   LV E/e' lateral: 12.4 LV SV:         45 LV SV Index:   23 LVOT Area:     2.01 cm  RIGHT VENTRICLE RV Basal diam:  2.90 cm TAPSE (M-mode): 2.3 cm LEFT ATRIUM           Index LA diam:      3.15 cm 1.59 cm/m LA Vol (A2C): 19.1 ml 9.65 ml/m LA Vol (A4C): 39.2 ml 19.81 ml/m  AORTIC VALVE             PULMONIC VALVE LVOT Vmax:   122.00 cm/s PV Vmax:       1.52 m/s LVOT Vmean:  79.700 cm/s PV Peak grad:  9.2 mmHg LVOT VTI:    0.225 m  MV E velocity: 65.80 cm/s                            SHUNTS                            Systemic VTI:  0.22 m                            Systemic Diam: 1.60 cm Adrian BlackwaterShaukat Khan Electronically signed by Adrian BlackwaterShaukat Khan Signature Date/Time: 06/08/2022/3:51:14 PM    Final    MR Lumbar Spine W Wo Contrast  Result Date: 06/07/2022 CLINICAL DATA:  Low back pain with recurrent fevers, infection suspected; history of back surgery in 2014 EXAM: MRI LUMBAR SPINE WITHOUT AND WITH CONTRAST TECHNIQUE: Multiplanar and multiecho pulse sequences of the lumbar spine were obtained without and with intravenous contrast. CONTRAST:  55mL GADAVIST GADOBUTROL 1 MMOL/ML IV SOLN COMPARISON:  08/25/2012 MRI lumbar spine, correlation is also made with CT abdomen pelvis 06/06/2022 FINDINGS: Segmentation:  5 lumbar type vertebral bodies. Alignment:  Mild levocurvature.  No significant listhesis. Vertebrae: No acute fracture, evidence of discitis, or suspicious osseous lesion. Status post posterior fusion and decompression at L5-S1, with interbody disc spacer. Susceptibility artifact from the hardware limits evaluation at this level. Conus medullaris and cauda  equina: Conus extends to the L2 level. Conus and cauda equina appear normal. No abnormal enhancement. Paraspinal and other soft tissues: No acute finding. Disc levels: T12-L1: No significant disc bulge. No spinal canal stenosis or neural foraminal narrowing. L1-L2: No significant disc bulge. No spinal canal stenosis or neural foraminal narrowing. L2-L3: No significant disc bulge. Prominent epidural fat, which causes mild thecal sac narrowing. No osseous spinal canal stenosis. No neural foraminal narrowing. L3-L4: Mild disc bulge. Mild facet arthropathy. Prominent epidural fat. Mild thecal sac narrowing, without significant osseous stenosis. No neural foraminal narrowing. L4-L5: No significant disc bulge. Mild facet arthropathy. No spinal canal stenosis or neural foraminal narrowing. L5-S1: Status post fusion and decompression. No spinal canal stenosis or neural foraminal narrowing. IMPRESSION: 1. No evidence of infection in the lumbar spine. 2. Mild thecal sac narrowing at L2-L3 and L3-L4, secondary to prominent epidural fat. No osseous spinal canal stenosis or neural foraminal narrowing. Electronically Signed   By: Wiliam Ke M.D.   On: 06/07/2022 20:26   CT ABDOMEN PELVIS W CONTRAST  Result Date: 06/06/2022 CLINICAL DATA:  Hepatitis, acute.  Fever. EXAM: CT ABDOMEN AND PELVIS WITH CONTRAST TECHNIQUE: Multidetector CT imaging of the abdomen and pelvis was performed using the standard protocol following bolus administration of intravenous contrast. RADIATION DOSE REDUCTION: This exam was performed according to the departmental dose-optimization program which includes automated exposure control, adjustment of the mA and/or kV according to patient size and/or use of iterative reconstruction technique. CONTRAST:  OMNIPAQUE IOHEXOL 350 MG/ML SOLN COMPARISON:  CT 12/10/2021 FINDINGS: Lower chest: Assessed on concurrent chest CT, reported separately. Hepatobiliary: Diminished hepatic steatosis from prior exam.  No focal hepatic abnormality. Liver is mildly enlarged spanning 20.1 cm, previously 19 cm on my retrospective measurement. No panic fluid collection. Clips in the gallbladder fossa postcholecystectomy. No biliary dilatation. Pancreas: Unremarkable. No pancreatic ductal dilatation or surrounding inflammatory changes. Spleen: Normal in size without focal abnormality. Adrenals/Urinary Tract: Normal adrenal glands. No hydronephrosis, perinephric edema or renal calculi. No focal renal abnormality. Unremarkable urinary bladder. Stomach/Bowel: Ingested material distends the stomach, there is no gastric wall thickening. No bowel obstruction or inflammatory change. Small-moderate volume of stool in the colon. Prior appendectomy. Vascular/Lymphatic: Mild atherosclerosis of the abdominal aorta, no aneurysm. Patent portal, splenic and mesenteric veins. No adenopathy. Reproductive: Hysterectomy.  No adnexal mass. Other: Small amount of free fluid in the dependent pelvis, query small amount of adjacent fat stranding. No upper abdominal or perihepatic ascites. No free air or focal fluid collection. No abdominal wall hernia. Musculoskeletal: L5-S1 posterior lumbar fusion with interbody spacer. There are no acute or suspicious osseous abnormalities. IMPRESSION: 1. Small amount of free fluid in the dependent pelvis, query small amount of adjacent fat stranding. This is nonspecific. 2. Mild hepatomegaly. Subjectively diminished hepatic steatosis from prior exam. No focal liver lesion. Aortic Atherosclerosis (ICD10-I70.0). Electronically Signed   By: Narda Rutherford M.D.   On: 06/06/2022 23:37   CT Angio Chest Pulmonary Embolism (PE) W or WO Contrast  Result Date: 06/06/2022 CLINICAL DATA:  Pulmonary embolism (  PE) suspected, low to intermediate prob, positive D-dimer Fever. EXAM: CT ANGIOGRAPHY CHEST WITH CONTRAST TECHNIQUE: Multidetector CT imaging of the chest was performed using the standard protocol during bolus administration  of intravenous contrast. Multiplanar CT image reconstructions and MIPs were obtained to evaluate the vascular anatomy. RADIATION DOSE REDUCTION: This exam was performed according to the departmental dose-optimization program which includes automated exposure control, adjustment of the mA and/or kV according to patient size and/or use of iterative reconstruction technique. CONTRAST:  145mL OMNIPAQUE IOHEXOL 350 MG/ML SOLN COMPARISON:  Most recent chest radiograph 08/22/2020 FINDINGS: Cardiovascular: There are no filling defects within the pulmonary arteries to suggest pulmonary embolus. Thoracic aorta is normal in caliber without dissection or acute findings. The heart is normal in size. No pericardial effusion. Mediastinum/Nodes: Scattered small mediastinal and hilar lymph nodes, none of which are enlarged node size criteria. No axillary adenopathy. No esophageal wall thickening. Lungs/Pleura: Dependent hypoventilatory changes. Trace bilateral pleural thickening versus tiny effusions. Subsegmental atelectasis in the lingula. Mild central bronchitic change. No pulmonary mass. Upper Abdomen: Assessed on concurrent abdominopelvic CT, reported separately. Musculoskeletal: There are no acute or suspicious osseous abnormalities. No chest wall soft tissue abnormalities. Review of the MIP images confirms the above findings. IMPRESSION: 1. No pulmonary embolus. 2. Trace bilateral pleural thickening versus tiny effusions. 3. Mild central bronchitic change, can be seen with bronchitis or reactive airways disease. Electronically Signed   By: Keith Rake M.D.   On: 06/06/2022 23:30   MR Brain W and Wo Contrast  Result Date: 06/06/2022 CLINICAL DATA:  Sudden headache EXAM: MRI HEAD WITHOUT AND WITH CONTRAST MRV HEAD WITHOUT CONTRAST TECHNIQUE: Multiplanar, multiecho pulse sequences of the brain and surrounding structures were obtained without and with intravenous contrast. Angiographic images of the intracranial venous  structures were obtained using MRV technique without intravenous contrast. CONTRAST:  57mL GADAVIST GADOBUTROL 1 MMOL/ML IV SOLN COMPARISON:  Same-day CT head FINDINGS: MRI HEAD FINDINGS Brain: No acute infarction, hemorrhage, hydrocephalus, extra-axial collection or mass lesion. No pathologic intracranial enhancement. Focal contrast-enhancing lesion. Vascular: Normal flow voids. Skull and upper cervical spine: Normal marrow signal. Sinuses/Orbits: Polypoid mucosal thickening right maxillary sinus. Trace right mastoid effusion. Other: None MRV HEAD FINDINGS There is no evidence of dural venous sinus or deep cerebral vein thrombosis. No dural venous sinus stenosis. IMPRESSION: 1. No acute intracranial abnormality. 2. No evidence of dural venous sinus thrombosis. Electronically Signed   By: Marin Roberts M.D.   On: 06/06/2022 19:09   MR MRV HEAD W WO CONTRAST  Result Date: 06/06/2022 CLINICAL DATA:  Sudden headache EXAM: MRI HEAD WITHOUT AND WITH CONTRAST MRV HEAD WITHOUT CONTRAST TECHNIQUE: Multiplanar, multiecho pulse sequences of the brain and surrounding structures were obtained without and with intravenous contrast. Angiographic images of the intracranial venous structures were obtained using MRV technique without intravenous contrast. CONTRAST:  58mL GADAVIST GADOBUTROL 1 MMOL/ML IV SOLN COMPARISON:  Same-day CT head FINDINGS: MRI HEAD FINDINGS Brain: No acute infarction, hemorrhage, hydrocephalus, extra-axial collection or mass lesion. No pathologic intracranial enhancement. Focal contrast-enhancing lesion. Vascular: Normal flow voids. Skull and upper cervical spine: Normal marrow signal. Sinuses/Orbits: Polypoid mucosal thickening right maxillary sinus. Trace right mastoid effusion. Other: None MRV HEAD FINDINGS There is no evidence of dural venous sinus or deep cerebral vein thrombosis. No dural venous sinus stenosis. IMPRESSION: 1. No acute intracranial abnormality. 2. No evidence of dural venous sinus  thrombosis. Electronically Signed   By: Marin Roberts M.D.   On: 06/06/2022 19:09   CT Head  Wo Contrast  Result Date: 06/04/2022 CLINICAL DATA:  Meningitis/CNS infection suspected. EXAM: CT HEAD WITHOUT CONTRAST TECHNIQUE: Contiguous axial images were obtained from the base of the skull through the vertex without intravenous contrast. RADIATION DOSE REDUCTION: This exam was performed according to the departmental dose-optimization program which includes automated exposure control, adjustment of the mA and/or kV according to patient size and/or use of iterative reconstruction technique. COMPARISON:  08/31/2015 FINDINGS: Brain: No evidence of acute infarction, hemorrhage, hydrocephalus, extra-axial collection or mass lesion/mass effect. Vascular: No hyperdense vessel or unexpected calcification. Skull: Normal. Negative for fracture or focal lesion. Sinuses/Orbits: Paranasal sinuses and mastoid air cells are clear. Other: None IMPRESSION: No acute intracranial abnormalities. Electronically Signed   By: Signa Kell M.D.   On: 06/04/2022 14:21    Microbiology: Results for orders placed or performed during the hospital encounter of 06/06/22  Chlamydia/NGC rt PCR ([facility] only)     Status: None   Collection Time: 06/06/22  1:48 PM   Specimen: Urine  Result Value Ref Range Status   Specimen source GC/Chlam URINE, RANDOM  Final   Chlamydia Tr NOT DETECTED NOT DETECTED Final   N gonorrhoeae NOT DETECTED NOT DETECTED Final    Comment: (NOTE) This CT/NG assay has not been evaluated in patients with a history of  hysterectomy. Performed at [facility], [address] [place], [place] [address]   Resp Panel by RT-PCR (Flu A&B, Covid) Anterior Nasal Swab     Status: None   Collection Time: 06/06/22  5:24 PM   Specimen: Anterior Nasal Swab  Result Value Ref Range Status   SARS Coronavirus 2 by RT PCR NEGATIVE NEGATIVE Final    Comment: (NOTE) SARS-CoV-2 target nucleic acids are NOT  DETECTED.  The SARS-CoV-2 RNA is generally detectable in upper respiratory specimens during the acute phase of infection. The lowest concentration of SARS-CoV-2 viral copies this assay can detect is 138 copies/mL. A negative result does not preclude SARS-Cov-2 infection and should not be used as the sole basis for treatment or other patient management decisions. A negative result may occur with  improper specimen collection/handling, submission of specimen other than nasopharyngeal swab, presence of viral mutation(s) within the areas targeted by this assay, and inadequate number of viral copies(<138 copies/mL). A negative result must be combined with clinical observations, patient history, and epidemiological information. The expected result is Negative.  Fact Sheet for Patients:  BloggerCourse.com  Fact Sheet for Healthcare Providers:  SeriousBroker.it  This test is no t yet approved or cleared by the [place] FDA and  has been authorized for detection and/or diagnosis of SARS-CoV-2 by FDA under an Emergency Use Authorization (EUA). This EUA will remain  in effect (meaning this test can be used) for the duration of the COVID-19 declaration under Section 564(b)(1) of the Act, 21 U.S.C.section 360bbb-3(b)(1), unless the authorization is terminated  or revoked sooner.       Influenza A by PCR NEGATIVE NEGATIVE Final   Influenza B by PCR NEGATIVE NEGATIVE Final    Comment: (NOTE) The Xpert Xpress SARS-CoV-2/FLU/RSV plus assay is intended as an aid in the diagnosis of influenza from Nasopharyngeal swab specimens and should not be used as a sole basis for treatment. Nasal washings and aspirates are unacceptable for Xpert Xpress SARS-CoV-2/FLU/RSV testing.  Fact Sheet for Patients: BloggerCourse.com  Fact Sheet for Healthcare Providers: SeriousBroker.it  This test is not yet  approved or cleared by the [place] FDA and has been authorized for detection and/or diagnosis  of SARS-CoV-2 by FDA under an Emergency Use Authorization (EUA). This EUA will remain in effect (meaning this test can be used) for the duration of the COVID-19 declaration under Section 564(b)(1) of the Act, 21 U.S.C. section 360bbb-3(b)(1), unless the authorization is terminated or revoked.  Performed at [facility], [address] [place], [place] [address]   Respiratory (~20 pathogens) panel by PCR     Status: None   Collection Time: 06/07/22  8:18 AM   Specimen: Nasopharyngeal Swab; Respiratory  Result Value Ref Range Status   Adenovirus NOT DETECTED NOT DETECTED Final   Coronavirus 229E NOT DETECTED NOT DETECTED Final    Comment: (NOTE) The Coronavirus on the Respiratory Panel, DOES NOT test for the novel  Coronavirus (2019 nCoV)    Coronavirus HKU1 NOT DETECTED NOT DETECTED Final   Coronavirus NL63 NOT DETECTED NOT DETECTED Final   Coronavirus OC43 NOT DETECTED NOT DETECTED Final   Metapneumovirus NOT DETECTED NOT DETECTED Final   Rhinovirus / Enterovirus NOT DETECTED NOT DETECTED Final   Influenza A NOT DETECTED NOT DETECTED Final   Influenza B NOT DETECTED NOT DETECTED Final   Parainfluenza Virus 1 NOT DETECTED NOT DETECTED Final   Parainfluenza Virus 2 NOT DETECTED NOT DETECTED Final   Parainfluenza Virus 3 NOT DETECTED NOT DETECTED Final   Parainfluenza Virus 4 NOT DETECTED NOT DETECTED Final   Respiratory Syncytial Virus NOT DETECTED NOT DETECTED Final   Bordetella pertussis NOT DETECTED NOT DETECTED Final   Bordetella Parapertussis NOT DETECTED NOT DETECTED Final   Chlamydophila pneumoniae NOT DETECTED NOT DETECTED Final   Mycoplasma pneumoniae NOT DETECTED NOT DETECTED Final    Comment: Performed at [facility], [address], [place], [place] [address]  Resp panel by RT-PCR (RSV, Flu A&B, Covid) Anterior Nasal Swab     Status: Abnormal    Collection Time: 06/10/22  8:15 AM   Specimen: Anterior Nasal Swab  Result Value Ref Range Status   SARS Coronavirus 2 by RT PCR NEGATIVE NEGATIVE Final    Comment: (NOTE) SARS-CoV-2 target nucleic acids are NOT DETECTED.  The SARS-CoV-2 RNA is generally detectable in upper respiratory specimens during the acute phase of infection. The lowest concentration of SARS-CoV-2 viral copies this assay can detect is 138 copies/mL. A negative result does not preclude SARS-Cov-2 infection and should not be used as the sole basis for treatment or other patient management decisions. A negative result may occur with  improper specimen collection/handling, submission of specimen other than nasopharyngeal swab, presence of viral mutation(s) within the areas targeted by this assay, and inadequate number of viral copies(<138 copies/mL). A negative result must be combined with clinical observations, patient history, and epidemiological information. The expected result is Negative.  Fact Sheet for Patients:  BloggerCourse.com  Fact Sheet for Healthcare Providers:  SeriousBroker.it  This test is no t yet approved or cleared by the [place] FDA and  has been authorized for detection and/or diagnosis of SARS-CoV-2 by FDA under an Emergency Use Authorization (EUA). This EUA will remain  in effect (meaning this test can be used) for the duration of the COVID-19 declaration under Section 564(b)(1) of the Act, 21 U.S.C.section 360bbb-3(b)(1), unless the authorization is terminated  or revoked sooner.       Influenza A by PCR POSITIVE (A) NEGATIVE Final   Influenza B by PCR NEGATIVE NEGATIVE Final    Comment: (NOTE) The Xpert Xpress SARS-CoV-2/FLU/RSV plus assay is intended as an aid in the diagnosis of influenza  from Nasopharyngeal swab specimens and should not be used as a sole basis for treatment. Nasal washings and aspirates are unacceptable for  Xpert Xpress SARS-CoV-2/FLU/RSV testing.  Fact Sheet for Patients: BloggerCourse.com  Fact Sheet for Healthcare Providers: SeriousBroker.it  This test is not yet approved or cleared by the [place] FDA and has been authorized for detection and/or diagnosis of SARS-CoV-2 by FDA under an Emergency Use Authorization (EUA). This EUA will remain in effect (meaning this test can be used) for the duration of the COVID-19 declaration under Section 564(b)(1) of the Act, 21 U.S.C. section 360bbb-3(b)(1), unless the authorization is terminated or revoked.     Resp Syncytial Virus by PCR NEGATIVE NEGATIVE Final    Comment: (NOTE) Fact Sheet for Patients: BloggerCourse.com  Fact Sheet for Healthcare Providers: SeriousBroker.it  This test is not yet approved or cleared by the [place] FDA and has been authorized for detection and/or diagnosis of SARS-CoV-2 by FDA under an Emergency Use Authorization (EUA). This EUA will remain in effect (meaning this test can be used) for the duration of the COVID-19 declaration under Section 564(b)(1) of the Act, 21 U.S.C. section 360bbb-3(b)(1), unless the authorization is terminated or revoked.  Performed at [facility], [address] [place], [place] [address]     Labs: CBC: Recent Labs  Lab 06/06/22 1334 06/07/22 0120 06/08/22 0553 06/09/22 0347 06/10/22 0235 06/11/22 0458 06/12/22 0401  WBC 6.7   < > 5.0 5.1 7.1 6.9 6.0  NEUTROABS 3.9  --   --   --   --   --   --   HGB 11.9*   < > 11.9* 11.6* 11.8* 10.8* 11.5*  HCT 35.3*   < > 35.6* 34.0* 35.2* 32.9* 35.3*  MCV 93.9   < > 93.2 91.6 93.1 94.0 95.1  PLT 207   < > 201 228 246 211 214   < > = values in this interval not displayed.   Basic Metabolic Panel: Recent Labs  Lab 06/08/22 0553 06/09/22 0347 06/10/22 0235 06/11/22 0458 06/12/22 0401  NA 138 138 138 139  139  K 3.5 3.6 4.0 3.5 4.1  CL 105 105 105 108 107  CO2 25 24 25 24 25   GLUCOSE 94 91 125* 90 88  BUN 6 6 8 6  5*  CREATININE 0.74 0.78 0.67 0.74 0.59  CALCIUM 8.4* 8.1* 8.5* 7.8* 8.0*  MG 2.1 2.1 2.3 1.7 2.3   Liver Function Tests: Recent Labs  Lab 06/07/22 0120 06/08/22 0553 06/09/22 0343 06/10/22 0232 06/12/22 0401  AST 346* 128* 61* 72* 108*  ALT 308* 262* 185* 170* 178*  ALKPHOS 263* 285* 248* 246* 279*  BILITOT 1.1 1.0 0.7 0.7 0.3  PROT 5.8* 6.5 6.2* 6.6 6.2*  ALBUMIN 2.8* 3.0* 2.9* 3.0* 3.0*   CBG: No results for input(s): "GLUCAP" in the last 168 hours.  Discharge time spent: greater than 30 minutes.  Signed: 08/09/22, MD [facility] 06/13/2022

## 2022-06-13 NOTE — Plan of Care (Signed)

## 2022-06-18 ENCOUNTER — Other Ambulatory Visit: Payer: Self-pay | Admitting: Family Medicine

## 2022-06-18 ENCOUNTER — Encounter: Payer: Self-pay | Admitting: Family Medicine

## 2022-06-18 MED ORDER — HYDROCOD POLI-CHLORPHE POLI ER 10-8 MG/5ML PO SUER: 5.0000 mL | Freq: Two times a day (BID) | ORAL | 0 refills | Status: DC | PRN

## 2022-06-20 ENCOUNTER — Encounter: Payer: Self-pay | Admitting: Gastroenterology

## 2022-06-20 ENCOUNTER — Ambulatory Visit (INDEPENDENT_AMBULATORY_CARE_PROVIDER_SITE_OTHER): Payer: 59 | Admitting: Gastroenterology

## 2022-06-20 ENCOUNTER — Other Ambulatory Visit: Payer: Self-pay

## 2022-06-20 VITALS — BP 95/66 | HR 118 | Temp 98.3°F | Ht 66.0 in | Wt 193.0 lb

## 2022-06-20 DIAGNOSIS — R1013 Epigastric pain: Secondary | ICD-10-CM

## 2022-06-20 DIAGNOSIS — R7989 Other specified abnormal findings of blood chemistry: Secondary | ICD-10-CM | POA: Diagnosis not present

## 2022-06-20 MED ORDER — OMEPRAZOLE 40 MG PO CPDR: 40.0000 mg | DELAYED_RELEASE_CAPSULE | Freq: Every day | ORAL | 0 refills | Status: DC

## 2022-06-20 NOTE — Progress Notes (Signed)
Cephas Darby, MD [address]  [place]  [place], [place] [address]  Main: [phone]  Fax: [phone]    Gastroenterology Consultation  Referring Provider:     Gwyneth Sprout, FNP Primary Care Physician:  Gwyneth Sprout, FNP Primary Gastroenterologist:  Dr. Cephas Darby Reason for Consultation: Upper abdominal pain, elevated LFTs        HPI:   [name] is a 47 y.o. female referred by Gwyneth Sprout, FNP  for consultation & management of upper abdominal pain, elevated LFTs.  Patient was admitted to [facility] first week of January secondary to fever, headache, elevated FTs.  She was tested positive for influenza and was given Tamiflu for 5 days.  She did have elevated transaminases and alkaline phosphatase.  Total bilirubin was normal.  She underwent extensive secondary liver disease workup which was all negative.  Imaging revealed fatty liver only.  Serum lipase levels were normal is also been experiencing intermittent early satiety, abdominal bloating and the symptoms are most postprandial.  With subjective fevers, nausea as well.  She denies any black stools or rectal bleeding.  Patient used to drink heavily prior to hospital admission.  She was taking alternating Tylenol and NSAID 2 days prior to hospital admission every 3 hours.  NSAIDs: None  Antiplts/Anticoagulants/Anti thrombotics: None  GI Procedures: None  Past Medical History:  Diagnosis Date   BRCA negative 07/2018   MyRisk neg   Family history of breast cancer    IBIS=13%/riskscore=8.4%   Family history of ovarian cancer    Family history of pancreatic cancer    History of frequent urinary tract infections    Hypoglycemia    IBS (irritable bowel syndrome)     Past Surgical History:  Procedure Laterality Date   ABDOMINAL HYSTERECTOMY  07/2006   PARTIAL   BACK SURGERY     CHOLECYSTECTOMY     DIAGNOSTIC LAPAROSCOPY     test on bladder   GALLBLADDER SURGERY     LAPAROSCOPIC APPENDECTOMY N/A  12/08/2016   Procedure: APPENDECTOMY LAPAROSCOPIC;  Surgeon: Jules Husbands, MD;  Location: [facility] ORS;  Service: General;  Laterality: N/A;   MAXIMUM ACCESS (MAS)POSTERIOR LUMBAR INTERBODY FUSION (PLIF) 1 LEVEL N/A 05/06/2013   Procedure: LUMBAR FIVE-SACRAL ONE MAXIMUM ACCESS POSTERIOR LUMBAR INTERBODY FUSION;  Surgeon: Eustace Moore, MD;  Location: [facility];  Service: Neurosurgery;  Laterality: N/A;   TOE SURGERY     TUBAL LIGATION     WISDOM TOOTH EXTRACTION       Current Outpatient Medications:    bacitracin-polymyxin b (POLYSPORIN) ophthalmic ointment, Place into the left eye 3 (three) times daily. Bring home the current vial, Disp: 3.5 g, Rfl: 0   chlorpheniramine-HYDROcodone (TUSSIONEX) 10-8 MG/5ML, Take 5 mLs by mouth every 12 (twelve) hours as needed for cough., Disp: 115 mL, Rfl: 0   cyclobenzaprine (FLEXERIL) 10 MG tablet, Take 1 tablet (10 mg total) by mouth 3 (three) times daily as needed for muscle spasms (neck pain/spasms)., Disp: 30 tablet, Rfl: 0   FLUoxetine (PROZAC) 40 MG capsule, Take 1 capsule (40 mg total) by mouth daily., Disp: 90 capsule, Rfl: 3   hydrOXYzine (VISTARIL) 25 MG capsule, TAKE 1 CAPSULE BY MOUTH EVERY 8 HOURS AS NEEDED, Disp: 30 capsule, Rfl: 0   omeprazole (PRILOSEC) 40 MG capsule, Take 1 capsule (40 mg total) by mouth daily., Disp: 30 capsule, Rfl: 0   traZODone (DESYREL) 100 MG tablet, Take 1-2 tablets PO as needed to assist  with sleep, Disp: 180 tablet, Rfl: 3   valACYclovir (VALTREX) 500 MG tablet, Take 2 tablets (1,000 mg total) by mouth 2 (two) times daily., Disp: 20 tablet, Rfl: 47   Family History  Problem Relation Age of Onset   Hypertension Mother    COPD Mother    Epilepsy Mother    Sleep apnea Mother    Hypertension Father    Sleep apnea Father    Breast cancer Maternal Aunt 23   Ovarian cancer Maternal Aunt 45   Colon cancer Paternal Uncle 105   Pancreatic cancer Maternal Grandmother 52   Colon cancer Maternal Grandfather 62      Social History   Tobacco Use   Smoking status: Every Day    Packs/day: 0.50    Years: 20.00    Total pack years: 10.00    Types: Cigarettes    Last attempt to quit: 05/30/2018    Years since quitting: 4.0   Smokeless tobacco: Never  Vaping Use   Vaping Use: Never used  Substance Use Topics   Alcohol use: Yes    Alcohol/week: 40.0 standard drinks of alcohol    Types: 40 Cans of beer per week   Drug use: No    Allergies as of 06/20/2022 - Review Complete 06/20/2022  Allergen Reaction Noted   Codeine Nausea And Vomiting 03/10/2013   Sulfa antibiotics Rash 03/10/2013   Vicodin [hydrocodone-acetaminophen] Nausea Only 03/12/2013    Review of Systems:    All systems reviewed and negative except where noted in HPI.   Physical Exam:  BP 95/66 (BP Location: Left Arm, Patient Position: Sitting, Cuff Size: Normal)   Pulse (!) 118   Temp 98.3 F (36.8 C) (Oral)   Ht 5\' 6"  (1.676 m)   Wt 193 lb (87.5 kg)   BMI 31.15 kg/m  No LMP recorded. Patient has had a hysterectomy.  General:   Alert,  Well-developed, well-nourished, pleasant and cooperative in NAD Head:  Normocephalic and atraumatic. Eyes:  Sclera clear, no icterus.   Conjunctiva pink. Ears:  Normal auditory acuity. Nose:  No deformity, discharge, or lesions. Mouth:  No deformity or lesions,oropharynx pink & moist. Neck:  Supple; no masses or thyromegaly. Lungs:  Respirations even and unlabored.  Clear throughout to auscultation.   No wheezes, crackles, or rhonchi. No acute distress. Heart:  Regular rate and rhythm; no murmurs, clicks, rubs, or gallops. Abdomen:  Normal bowel sounds. Soft, epigastric tenderness and mildly distended, tympanic to percussion without masses, hepatosplenomegaly or hernias noted.  No guarding or rebound tenderness.   Rectal: Not performed Msk:  Symmetrical without gross deformities. Good, equal movement & strength bilaterally. Pulses:  Normal pulses noted. Extremities:  No clubbing or  edema.  No cyanosis. Neurologic:  Alert and oriented x3;  grossly normal neurologically. Skin:  Intact without significant lesions or rashes. No jaundice. Psych:  Alert and cooperative. Normal mood and affect.  Imaging Studies: Reviewed  Assessment and Plan:   [name] is a 47 y.o.  female with history of alcohol use, tobacco use, s/p cholecystectomy recent diagnosis of influenza, treatment with Tamiflu is seen in consultation for more than 2 weeks symptoms of dyspepsia and elevated LFTs  Dyspepsia Start omeprazole 40 mg daily before breakfast Recommend EGD for further evaluation  Elevated LFTs Recheck LFTs today, if persistently elevated, recommend ultrasound-guided liver biopsy Counseled to remain abstinent from alcohol use Secondary liver disease workup is negative   Follow up based on the above workup   Doris Lalley R Liboria Putnam,  MD

## 2022-06-21 ENCOUNTER — Other Ambulatory Visit: Payer: Self-pay | Admitting: Family Medicine

## 2022-06-21 ENCOUNTER — Telehealth: Payer: Self-pay

## 2022-06-21 ENCOUNTER — Ambulatory Visit: Payer: 59 | Admitting: Family Medicine

## 2022-06-21 ENCOUNTER — Encounter: Payer: Self-pay | Admitting: Family Medicine

## 2022-06-21 VITALS — BP 108/72 | HR 112 | Temp 98.3°F | Wt 192.1 lb

## 2022-06-21 DIAGNOSIS — R7989 Other specified abnormal findings of blood chemistry: Secondary | ICD-10-CM

## 2022-06-21 DIAGNOSIS — R509 Fever, unspecified: Secondary | ICD-10-CM | POA: Diagnosis not present

## 2022-06-21 DIAGNOSIS — Z09 Encounter for follow-up examination after completed treatment for conditions other than malignant neoplasm: Secondary | ICD-10-CM | POA: Insufficient documentation

## 2022-06-21 DIAGNOSIS — R6881 Early satiety: Secondary | ICD-10-CM | POA: Insufficient documentation

## 2022-06-21 DIAGNOSIS — R519 Headache, unspecified: Secondary | ICD-10-CM

## 2022-06-21 DIAGNOSIS — R52 Pain, unspecified: Secondary | ICD-10-CM

## 2022-06-21 LAB — HEPATIC FUNCTION PANEL
ALT: 92 IU/L — ABNORMAL HIGH (ref 0–32)
AST: 66 IU/L — ABNORMAL HIGH (ref 0–40)
Albumin: 3.8 g/dL — ABNORMAL LOW (ref 3.9–4.9)
Alkaline Phosphatase: 304 IU/L — ABNORMAL HIGH (ref 44–121)
Bilirubin Total: 0.3 mg/dL (ref 0.0–1.2)
Bilirubin, Direct: 0.14 mg/dL (ref 0.00–0.40)
Total Protein: 7.3 g/dL (ref 6.0–8.5)

## 2022-06-21 MED ORDER — METOCLOPRAMIDE HCL 5 MG PO TABS: 5.0000 mg | ORAL_TABLET | Freq: Four times a day (QID) | ORAL | 0 refills | Status: DC

## 2022-06-21 MED ORDER — HYDROCOD POLI-CHLORPHE POLI ER 10-8 MG/5ML PO SUER: 5.0000 mL | Freq: Two times a day (BID) | ORAL | 0 refills | Status: DC | PRN

## 2022-06-21 NOTE — Telephone Encounter (Signed)
Patient verbalized understanding of results. She is okay with Ultrasound liver biopsy and fax paperwork to special procedures and order the test made 3 month follow up  Eden Lathe and left a message for call back to find out when patient is schedule

## 2022-06-21 NOTE — Assessment & Plan Note (Signed)
Acute on chronic, has seen GI for follow up elevated LFTS Has not been around anyone with similar symptoms or has completed recent travel Repeat CBC today as well as CMP

## 2022-06-21 NOTE — Progress Notes (Signed)
I,Connie R Striblin,acting as a Education administrator for Gwyneth Sprout, FNP.,have documented all relevant documentation on the behalf of Gwyneth Sprout, FNP,as directed by  Gwyneth Sprout, FNP while in the presence of Gwyneth Sprout, FNP.  Established patient visit  Patient: [name]   DOB: 04-15-1976   47 y.o. Female  MRN: [id number] Visit Date: 06/21/2022  Today's healthcare provider: Gwyneth Sprout, FNP RE Introduced to nurse practitioner role and practice setting.  All questions answered.  Discussed provider/patient relationship and expectations.  Subjective    HPI  Follow up Hospitalization  Patient was admitted to [facility] on 1/4 and discharged on 1/11. She was treated for fever and headache secondary to  influenza a and sinus infection and liver function changes  Treatment for this included Tamiflu  She reports good compliance with treatment. She reports this condition is stayed the same. GI outpatient visit on 06/20/22 Endoscopy scheduled for 06/27/22 Patient presents today with constant low grade fevers, migraines and cough, which is worse at night. Pt states that she has not improved and stills the same from when she was admitted.  ----------------------------------------------------------------------------------------- Medications: Outpatient Medications Prior to Visit  Medication Sig   bacitracin-polymyxin b (POLYSPORIN) ophthalmic ointment Place into the left eye 3 (three) times daily. Bring home the current vial   cyclobenzaprine (FLEXERIL) 10 MG tablet Take 1 tablet (10 mg total) by mouth 3 (three) times daily as needed for muscle spasms (neck pain/spasms).   FLUoxetine (PROZAC) 40 MG capsule Take 1 capsule (40 mg total) by mouth daily.   hydrOXYzine (VISTARIL) 25 MG capsule TAKE 1 CAPSULE BY MOUTH EVERY 8 HOURS AS NEEDED   omeprazole (PRILOSEC) 40 MG capsule Take 1 capsule (40 mg total) by mouth daily.   traZODone (DESYREL) 100 MG tablet Take 1-2 tablets PO as needed to assist with  sleep   valACYclovir (VALTREX) 500 MG tablet Take 2 tablets (1,000 mg total) by mouth 2 (two) times daily.   [DISCONTINUED] chlorpheniramine-HYDROcodone (TUSSIONEX) 10-8 MG/5ML Take 5 mLs by mouth every 12 (twelve) hours as needed for cough.   No facility-administered medications prior to visit.    Review of Systems    Objective    BP 108/72 (BP Location: Right Arm, Patient Position: Sitting, Cuff Size: Normal)   Pulse (!) 112   Temp 98.3 F (36.8 C) (Oral)   Wt 192 lb 1.6 oz (87.1 kg)   SpO2 97%   BMI 31.01 kg/m   Physical Exam Vitals and nursing note reviewed.  Constitutional:      General: She is not in acute distress.    Appearance: Normal appearance. She is obese. She is not ill-appearing, toxic-appearing or diaphoretic.  HENT:     Head: Normocephalic and atraumatic.     Right Ear: Tympanic membrane, ear canal and external ear normal.     Left Ear: Tympanic membrane, ear canal and external ear normal.     Nose: Nose normal.     Mouth/Throat:     Mouth: Mucous membranes are moist.     Pharynx: Oropharynx is clear. No posterior oropharyngeal erythema.  Eyes:     Conjunctiva/sclera: Conjunctivae normal.     Pupils: Pupils are equal, round, and reactive to light.  Cardiovascular:     Rate and Rhythm: Regular rhythm. Tachycardia present.     Pulses: Normal pulses.     Heart sounds: Normal heart sounds. No murmur heard.    No friction rub. No gallop.  Pulmonary:  Effort: Pulmonary effort is normal. No respiratory distress.     Breath sounds: Normal breath sounds. No stridor. No wheezing, rhonchi or rales.  Chest:     Chest wall: No tenderness.  Abdominal:     General: Bowel sounds are normal.     Palpations: Abdomen is soft.  Musculoskeletal:        General: No swelling, tenderness, deformity or signs of injury. Normal range of motion.     Right lower leg: No edema.     Left lower leg: No edema.  Skin:    General: Skin is warm and dry.     Capillary Refill:  Capillary refill takes less than 2 seconds.     Coloration: Skin is not jaundiced or pale.     Findings: No bruising, erythema, lesion or rash.  Neurological:     General: No focal deficit present.     Mental Status: She is alert and oriented to person, place, and time. Mental status is at baseline.     Cranial Nerves: No cranial nerve deficit.     Sensory: No sensory deficit.     Motor: No weakness.     Coordination: Coordination normal.  Psychiatric:        Mood and Affect: Mood normal. Affect is flat.        Behavior: Behavior normal.        Thought Content: Thought content normal.        Judgment: Judgment normal.    No results found for any visits on 06/21/22.  Assessment & Plan     Problem List Items Addressed This Visit       Other   Body aches    Acute on chronic, has seen GI for follow up elevated LFTS Has not been around anyone with similar symptoms or has completed recent travel Repeat CBC today as well as CMP       Relevant Orders   Basic Metabolic Panel (BMET)   CBC with Differential/Platelet   Lipid panel   H. pylori breath test   Daily headache    Chronic, intractable Referral to neurology Continue to recommend small, frequent meals; adequate hydration and sleep >6 hours Monitor triggers       Relevant Orders   Ambulatory referral to Neurology   Early satiety    Is now on PPI; started 1/18 Followed by [facility] Plan for EGD Recommend H Pylori testing as well as use of motility agent, reglan to assist      Relevant Medications   metoCLOPramide (REGLAN) 5 MG tablet   Other Relevant Orders   H. pylori breath test   Fever - Primary    Acute on chronic, has completed course of Tamiflu for Flu A Noted to have markers for inflammation, CRP/Sed rate, as well as CMV+ and RMSF Ag. Fevers have been <102F with use of APAP 1000 mg TID/continues to endorse cough- wishes to repeat PRN cough medication to assist       Relevant Medications    chlorpheniramine-HYDROcodone (TUSSIONEX) 10-8 MG/5ML   Other Relevant Orders   Basic Metabolic Panel (BMET)   CBC with Differential/Platelet   Lipid panel   H. pylori breath test   Hospital discharge follow-up    Significant infectious disease workup at [facility]      No follow-ups on file.     Leilani Merl, FNP, have reviewed all documentation for this visit. The documentation on 06/21/22 for the exam, diagnosis, procedures, and orders are all  accurate and complete.  Gwyneth Sprout, [place] [phone] (phone) [phone] (fax)  [place]

## 2022-06-21 NOTE — Anesthesia Preprocedure Evaluation (Deleted)
Anesthesia Evaluation    Airway        Dental   Pulmonary Current Smoker          Cardiovascular      Neuro/Psych    GI/Hepatic   Endo/Other    Renal/GU      Musculoskeletal   Abdominal   Peds  Hematology   Anesthesia Other Findings   Reproductive/Obstetrics                              Anesthesia Physical Anesthesia Plan Anesthesia Quick Evaluation

## 2022-06-21 NOTE — Assessment & Plan Note (Addendum)
Acute on chronic, has completed course of Tamiflu for Flu A Noted to have markers for inflammation, CRP/Sed rate, as well as CMV+ and RMSF Ag. Fevers have been <102F with use of APAP 1000 mg TID/continues to endorse cough- wishes to repeat PRN cough medication to assist

## 2022-06-21 NOTE — Telephone Encounter (Signed)
-----  Message from Lin Landsman, MD sent at 06/21/2022 10:24 AM EST ----- Please inform patient that her LFTs still remain elevated.  As discussed, all the previous blood work results came back normal, therefore I recommend ultrasound-guided liver biopsy if patient is agreeable  [facility]

## 2022-06-21 NOTE — Assessment & Plan Note (Signed)
Is now on PPI; started 1/18 Followed by [facility] Plan for EGD Recommend H Pylori testing as well as use of motility agent, reglan to assist

## 2022-06-21 NOTE — Assessment & Plan Note (Signed)
Chronic, intractable Referral to neurology Continue to recommend small, frequent meals; adequate hydration and sleep >6 hours Monitor triggers

## 2022-06-21 NOTE — Telephone Encounter (Signed)
[name] called back and got patient schedule for 07/05/2019 arrive to [facility] and vascular at 7:30am for a 8:30am scan. Nothing to eat or drink after midnight. Is under moderate sedation and will need a driver. Patient verbalized understanding of instructions

## 2022-06-21 NOTE — Telephone Encounter (Signed)
-----  Message from Lin Landsman, MD sent at 06/21/2022 10:25 AM EST ----- Recommend clinic follow-up in 3 months, okay to overbook  RV

## 2022-06-21 NOTE — Assessment & Plan Note (Addendum)
Significant infectious disease workup at [facility]; deeming fevers iso FluA. However, despite tamiflu treatment fevers remain.

## 2022-06-24 ENCOUNTER — Other Ambulatory Visit: Payer: Self-pay | Admitting: Family Medicine

## 2022-06-24 ENCOUNTER — Encounter: Payer: Self-pay | Admitting: Family Medicine

## 2022-06-24 LAB — CBC WITH DIFFERENTIAL/PLATELET
Basophils Absolute: 0.1 10*3/uL (ref 0.0–0.2)
Basos: 1 %
EOS (ABSOLUTE): 0.3 10*3/uL (ref 0.0–0.4)
Eos: 3 %
Hematocrit: 40.3 % (ref 34.0–46.6)
Hemoglobin: 13.5 g/dL (ref 11.1–15.9)
Immature Grans (Abs): 0.1 10*3/uL (ref 0.0–0.1)
Immature Granulocytes: 1 %
Lymphocytes Absolute: 6.8 10*3/uL — ABNORMAL HIGH (ref 0.7–3.1)
Lymphs: 60 %
MCH: 31.1 pg (ref 26.6–33.0)
MCHC: 33.5 g/dL (ref 31.5–35.7)
MCV: 93 fL (ref 79–97)
Monocytes Absolute: 0.5 10*3/uL (ref 0.1–0.9)
Monocytes: 4 %
Neutrophils Absolute: 3.4 10*3/uL (ref 1.4–7.0)
Neutrophils: 31 %
Platelets: 291 10*3/uL (ref 150–450)
RBC: 4.34 x10E6/uL (ref 3.77–5.28)
RDW: 11.9 % (ref 11.7–15.4)
WBC: 11.2 10*3/uL — ABNORMAL HIGH (ref 3.4–10.8)

## 2022-06-24 LAB — BASIC METABOLIC PANEL
BUN/Creatinine Ratio: 8 — ABNORMAL LOW (ref 9–23)
BUN: 7 mg/dL (ref 6–24)
CO2: 24 mmol/L (ref 20–29)
Calcium: 9.2 mg/dL (ref 8.7–10.2)
Chloride: 97 mmol/L (ref 96–106)
Creatinine, Ser: 0.87 mg/dL (ref 0.57–1.00)
Glucose: 104 mg/dL — ABNORMAL HIGH (ref 70–99)
Potassium: 4.8 mmol/L (ref 3.5–5.2)
Sodium: 135 mmol/L (ref 134–144)
eGFR: 84 mL/min/{1.73_m2} (ref >59)

## 2022-06-24 LAB — LIPID PANEL
Chol/HDL Ratio: 7.9 ratio — ABNORMAL HIGH (ref 0.0–4.4)
Cholesterol, Total: 220 mg/dL — ABNORMAL HIGH (ref 100–199)
HDL: 28 mg/dL — ABNORMAL LOW (ref >39)
LDL Chol Calc (NIH): 103 mg/dL — ABNORMAL HIGH (ref 0–99)
Triglycerides: 524 mg/dL — ABNORMAL HIGH (ref 0–149)
VLDL Cholesterol Cal: 89 mg/dL — ABNORMAL HIGH (ref 5–40)

## 2022-06-24 LAB — H. PYLORI BREATH TEST: H pylori Breath Test: NEGATIVE

## 2022-06-24 MED ORDER — ROSUVASTATIN CALCIUM 10 MG PO TABS: 10.0000 mg | ORAL_TABLET | Freq: Every day | ORAL | 3 refills | Status: DC

## 2022-06-24 NOTE — Progress Notes (Signed)
Cholesterol is significantly increased from last year. All labs in lipid panel are abnormal. Stroke and MI risk is elevated at 7% in 10 years. Recommend starting statin medication for risk reduction and continued work on tobacco cessation.  The 10-year ASCVD risk score (Arnett DK, et al., 2019) is: 6.8%   Values used to calculate the score:     Age: 47 years     Sex: Female     Is Non-Hispanic African American: No     Diabetic: No     Tobacco smoker: Yes     Systolic Blood Pressure: 378 mmHg     Is BP treated: No     HDL Cholesterol: 28 mg/dL     Total Cholesterol: 220 mg/dL

## 2022-06-24 NOTE — Anesthesia Preprocedure Evaluation (Addendum)
Anesthesia Evaluation  Patient identified by MRN, date of birth, ID band Patient awake    Reviewed: Allergy & Precautions, NPO status , Patient's Chart, lab work & pertinent test results  History of Anesthesia Complications Negative for: history of anesthetic complications  Airway Mallampati: I   Neck ROM: Full    Dental no notable dental hx.    Pulmonary Current Smoker (3/4 ppd)Patient did not abstain from smoking.   Pulmonary exam normal breath sounds clear to auscultation       Cardiovascular Exercise Tolerance: Good negative cardio ROS Normal cardiovascular exam Rhythm:Regular Rate:Normal     Neuro/Psych  Headaches PSYCHIATRIC DISORDERS  Depression       GI/Hepatic ,GERD  ,,  Endo/Other  Obesity   Renal/GU negative Renal ROS     Musculoskeletal   Abdominal   Peds  Hematology negative hematology ROS (+)   Anesthesia Other Findings   Reproductive/Obstetrics                             Anesthesia Physical Anesthesia Plan  ASA: 2  Anesthesia Plan: General   Post-op Pain Management:    Induction: Intravenous  PONV Risk Score and Plan: 2 and Propofol infusion, TIVA and Treatment may vary due to age or medical condition  Airway Management Planned: Natural Airway  Additional Equipment:   Intra-op Plan:   Post-operative Plan:   Informed Consent: I have reviewed the patients History and Physical, chart, labs and discussed the procedure including the risks, benefits and alternatives for the proposed anesthesia with the patient or authorized representative who has indicated his/her understanding and acceptance.       Plan Discussed with: CRNA  Anesthesia Plan Comments: (LMA/GETA backup discussed.  Patient consented for risks of anesthesia including but not limited to:  - adverse reactions to medications - damage to eyes, teeth, lips or other oral mucosa - nerve damage due  to positioning  - sore throat or hoarseness - damage to heart, brain, nerves, lungs, other parts of body or loss of life  Informed patient about role of CRNA in peri- and intra-operative care.  Patient voiced understanding.)       Anesthesia Quick Evaluation

## 2022-06-25 ENCOUNTER — Ambulatory Visit: Payer: 59 | Admitting: Anesthesiology

## 2022-06-25 ENCOUNTER — Encounter: Payer: Self-pay | Admitting: Gastroenterology

## 2022-06-25 ENCOUNTER — Telehealth: Payer: Self-pay | Admitting: Gastroenterology

## 2022-06-25 ENCOUNTER — Ambulatory Visit
Admission: RE | Disposition: A | Discharge status: Home / Self Care | Payer: Self-pay | Source: Home / Self Care | Attending: Gastroenterology

## 2022-06-25 ENCOUNTER — Other Ambulatory Visit: Payer: Self-pay | Admitting: Gastroenterology

## 2022-06-25 ENCOUNTER — Ambulatory Visit
Admission: RE | Admit: 2022-06-25 | Discharge: 2022-06-25 | Disposition: A | Discharge status: Home / Self Care | Payer: 59 | Attending: Gastroenterology | Admitting: Gastroenterology

## 2022-06-25 ENCOUNTER — Other Ambulatory Visit: Payer: Self-pay

## 2022-06-25 DIAGNOSIS — K219 Gastro-esophageal reflux disease without esophagitis: Secondary | ICD-10-CM | POA: Insufficient documentation

## 2022-06-25 DIAGNOSIS — F17210 Nicotine dependence, cigarettes, uncomplicated: Secondary | ICD-10-CM | POA: Diagnosis not present

## 2022-06-25 DIAGNOSIS — K2950 Unspecified chronic gastritis without bleeding: Secondary | ICD-10-CM | POA: Insufficient documentation

## 2022-06-25 DIAGNOSIS — E669 Obesity, unspecified: Secondary | ICD-10-CM | POA: Insufficient documentation

## 2022-06-25 DIAGNOSIS — Z6830 Body mass index (BMI) 30.0-30.9, adult: Secondary | ICD-10-CM | POA: Diagnosis not present

## 2022-06-25 DIAGNOSIS — F32A Depression, unspecified: Secondary | ICD-10-CM | POA: Insufficient documentation

## 2022-06-25 DIAGNOSIS — K2980 Duodenitis without bleeding: Secondary | ICD-10-CM | POA: Insufficient documentation

## 2022-06-25 DIAGNOSIS — R1013 Epigastric pain: Secondary | ICD-10-CM | POA: Diagnosis not present

## 2022-06-25 DIAGNOSIS — B9681 Helicobacter pylori [H. pylori] as the cause of diseases classified elsewhere: Secondary | ICD-10-CM | POA: Insufficient documentation

## 2022-06-25 HISTORY — PX: ESOPHAGOGASTRODUODENOSCOPY (EGD) WITH PROPOFOL: SHX5813

## 2022-06-25 HISTORY — DX: Dizziness and giddiness: R42

## 2022-06-25 HISTORY — DX: Migraine, unspecified, not intractable, without status migrainosus: G43.909

## 2022-06-25 HISTORY — DX: Gastro-esophageal reflux disease without esophagitis: K21.9

## 2022-06-25 SURGERY — ESOPHAGOGASTRODUODENOSCOPY (EGD) WITH PROPOFOL
Anesthesia: General

## 2022-06-25 MED ORDER — ACETAMINOPHEN 325 MG PO TABS: 650.0000 mg | ORAL_TABLET | Freq: Once | ORAL | Status: DC | PRN

## 2022-06-25 MED ORDER — LACTATED RINGERS IV SOLN: INTRAVENOUS | Status: DC

## 2022-06-25 MED ORDER — STERILE WATER FOR IRRIGATION IR SOLN
Status: DC | PRN
  Administered 2022-06-25: 1000 mL

## 2022-06-25 MED ORDER — ACETAMINOPHEN 160 MG/5ML PO SOLN: 325.0000 mg | ORAL | Status: DC | PRN

## 2022-06-25 MED ORDER — PROPOFOL 10 MG/ML IV BOLUS
INTRAVENOUS | Status: DC | PRN
  Administered 2022-06-25: 20 mg via INTRAVENOUS
  Administered 2022-06-25: 50 mg via INTRAVENOUS
  Administered 2022-06-25: 100 mg via INTRAVENOUS
  Administered 2022-06-25: 30 mg via INTRAVENOUS

## 2022-06-25 MED ORDER — SODIUM CHLORIDE 0.9 % IV SOLN: INTRAVENOUS | Status: DC

## 2022-06-25 MED ORDER — HYDROXYZINE HCL 25 MG PO TABS: 50.0000 mg | ORAL_TABLET | Freq: Three times a day (TID) | ORAL | 2 refills | Status: AC | PRN

## 2022-06-25 MED ORDER — ONDANSETRON HCL 4 MG/2ML IJ SOLN: 4.0000 mg | Freq: Once | INTRAMUSCULAR | Status: DC | PRN

## 2022-06-25 MED ORDER — LIDOCAINE HCL (CARDIAC) PF 100 MG/5ML IV SOSY
PREFILLED_SYRINGE | INTRAVENOUS | Status: DC | PRN
  Administered 2022-06-25: 50 mg via INTRAVENOUS

## 2022-06-25 MED ORDER — GLYCOPYRROLATE 0.2 MG/ML IJ SOLN
INTRAMUSCULAR | Status: DC | PRN
  Administered 2022-06-25: .2 mg via INTRAVENOUS

## 2022-06-25 SURGICAL SUPPLY — 32 items
BALLN DILATOR 12-15 8 (BALLOONS)
BALLN DILATOR 15-18 8 (BALLOONS)
BALLN DILATOR CRE 0-12 8 (BALLOONS)
BALLN DILATOR ESOPH 8 10 CRE (MISCELLANEOUS) IMPLANT
BALLOON DILATOR 12-15 8 (BALLOONS) IMPLANT
BALLOON DILATOR 15-18 8 (BALLOONS) IMPLANT
BALLOON DILATOR CRE 0-12 8 (BALLOONS) IMPLANT
BLOCK BITE 60FR ADLT L/F GRN (MISCELLANEOUS) ×1 IMPLANT
CLIP HMST 235XBRD CATH ROT (MISCELLANEOUS) IMPLANT
CLIP RESOLUTION 360 11X235 (MISCELLANEOUS)
ELECT REM PT RETURN 9FT ADLT (ELECTROSURGICAL)
ELECTRODE REM PT RTRN 9FT ADLT (ELECTROSURGICAL) IMPLANT
FCP ESCP3.2XJMB 240X2.8X (MISCELLANEOUS) ×1
FORCEPS BIOP RAD 4 LRG CAP 4 (CUTTING FORCEPS) IMPLANT
FORCEPS BIOP RJ4 240 W/NDL (MISCELLANEOUS) ×1
FORCEPS ESCP3.2XJMB 240X2.8X (MISCELLANEOUS) IMPLANT
GOWN CVR UNV OPN BCK APRN NK (MISCELLANEOUS) ×2 IMPLANT
GOWN ISOL THUMB LOOP REG UNIV (MISCELLANEOUS) ×2
INJECTOR VARIJECT VIN23 (MISCELLANEOUS) IMPLANT
KIT DEFENDO VALVE AND CONN (KITS) IMPLANT
KIT PRC NS LF DISP ENDO (KITS) ×1 IMPLANT
KIT PROCEDURE OLYMPUS (KITS) ×1
MANIFOLD NEPTUNE II (INSTRUMENTS) ×1 IMPLANT
MARKER SPOT ENDO TATTOO 5ML (MISCELLANEOUS) IMPLANT
RETRIEVER NET PLAT FOOD (MISCELLANEOUS) IMPLANT
SNARE SHORT THROW 13M SML OVAL (MISCELLANEOUS) IMPLANT
SNARE SHORT THROW 30M LRG OVAL (MISCELLANEOUS) IMPLANT
SYR INFLATION 60ML (SYRINGE) IMPLANT
TRAP ETRAP POLY (MISCELLANEOUS) IMPLANT
VARIJECT INJECTOR VIN23 (MISCELLANEOUS)
WATER STERILE IRR 250ML POUR (IV SOLUTION) ×1 IMPLANT
WIRE CRE 18-20MM 8CM F G (MISCELLANEOUS) IMPLANT

## 2022-06-25 NOTE — Transfer of Care (Signed)
Immediate Anesthesia Transfer of Care Note  Patient: [name]  Procedure(s) Performed: ESOPHAGOGASTRODUODENOSCOPY (EGD) WITH PROPOFOL  Patient Location: PACU  Anesthesia Type: General  Level of Consciousness: awake, alert  and patient cooperative  Airway and Oxygen Therapy: Patient Spontanous Breathing and Patient connected to supplemental oxygen  Post-op Assessment: Post-op Vital signs reviewed, Patient's Cardiovascular Status Stable, Respiratory Function Stable, Patent Airway and No signs of Nausea or vomiting  Post-op Vital Signs: Reviewed and stable  Complications: No notable events documented.

## 2022-06-25 NOTE — Op Note (Signed)
[facility] Patient Name: [name] Procedure Date: 06/25/2022 9:32 AM MRN: [id number] Account #: [id number] Date of Birth: 14-Sep-1975 Admit Type: Outpatient Age: 47 Room: [facility] OR ROOM 01 Gender: Female Note Status: Finalized Instrument Name: [id number] Procedure:             Upper GI endoscopy Indications:           Dyspepsia Providers:             Lin Landsman MD, MD Referring MD:          Jaci Standard. Rollene Rotunda (Referring MD) Medicines:             General Anesthesia Complications:         No immediate complications. Estimated blood loss: None. Procedure:             Pre-Anesthesia Assessment:                        - Prior to the procedure, a History and Physical was                         performed, and patient medications and allergies were                         reviewed. The patient is competent. The risks and                         benefits of the procedure and the sedation options and                         risks were discussed with the patient. All questions                         were answered and informed consent was obtained.                         Patient identification and proposed procedure were                         verified by the physician, the nurse, the                         anesthesiologist, the anesthetist and the technician                         in the pre-procedure area in the procedure room in the                         endoscopy suite. Mental Status Examination: alert and                         oriented. Airway Examination: normal oropharyngeal                         airway and neck mobility. Respiratory Examination:                         clear to auscultation. CV Examination: normal.  Prophylactic Antibiotics: The patient does not require                         prophylactic antibiotics. Prior Anticoagulants: The                         patient has taken no anticoagulant or  antiplatelet                         agents. ASA Grade Assessment: II - A patient with mild                         systemic disease. After reviewing the risks and                         benefits, the patient was deemed in satisfactory                         condition to undergo the procedure. The anesthesia                         plan was to use general anesthesia. Immediately prior                         to administration of medications, the patient was                         re-assessed for adequacy to receive sedatives. The                         heart rate, respiratory rate, oxygen saturations,                         blood pressure, adequacy of pulmonary ventilation, and                         response to care were monitored throughout the                         procedure. The physical status of the patient was                         re-assessed after the procedure.                        After obtaining informed consent, the endoscope was                         passed under direct vision. Throughout the procedure,                         the patient's blood pressure, pulse, and oxygen                         saturations were monitored continuously. The Endoscope                         was introduced through the mouth, and advanced to the  third part of duodenum. The upper GI endoscopy was                         accomplished without difficulty. The patient tolerated                         the procedure well. Findings:      The duodenal bulb, second portion of the duodenum and third portion of       the duodenum were normal. Biopsies for histology were taken with a cold       forceps for evaluation of celiac disease.      Diffuse moderately erythematous mucosa without bleeding was found in the       gastric antrum. Biopsies were taken with a cold forceps for Helicobacter       pylori testing.      The cardia and gastric fundus were normal on  retroflexion.      Esophagogastric landmarks were identified: the gastroesophageal junction       was found at 36 cm from the incisors.      The gastroesophageal junction and examined esophagus were normal. Impression:            - Normal duodenal bulb, second portion of the duodenum                         and third portion of the duodenum. Biopsied.                        - Erythematous mucosa in the antrum. Biopsied.                        - Esophagogastric landmarks identified.                        - Normal gastroesophageal junction and esophagus. Recommendation:        - Await pathology results.                        - Discharge patient to home (with escort).                        - Resume previous diet today.                        - Continue present medications.                        - Return to my office as previously scheduled. Procedure Code(s):     --- Professional ---                        [phone], Esophagogastroduodenoscopy, flexible,                         transoral; with biopsy, single or multiple Diagnosis Code(s):     --- Professional ---                        K31.89, Other diseases of stomach and duodenum  R10.13, Epigastric pain CPT copyright 2022 American Medical Association. All rights reserved. The codes documented in this report are preliminary and upon coder review may  be revised to meet current compliance requirements. Dr. Ulyess Mort Lin Landsman MD, MD 06/25/2022 9:50:34 AM This report has been signed electronically. Number of Addenda: 0 Note Initiated On: 06/25/2022 9:32 AM Total Procedure Duration: 0 hours 5 minutes 42 seconds  Estimated Blood Loss:  Estimated blood loss: none.      [facility]

## 2022-06-25 NOTE — Telephone Encounter (Signed)
Patient called stating that DR Marius Ditch told her to take the Hydroxyzine 50 MG at night dailey and she states she does not have that medication anymore. She states that it is in her paperwork she got after her procedure this morning.

## 2022-06-25 NOTE — H&P (Signed)
Cephas Darby, MD [address]  [place]  [place], [place] [address]  Main: [phone]  Fax: [phone] Pager: [phone]  Primary Care Physician:  Gwyneth Sprout, FNP Primary Gastroenterologist:  Dr. Cephas Darby  Pre-Procedure History & Physical: HPI:  [name] is a 47 y.o. female is here for an endoscopy.   Past Medical History:  Diagnosis Date   BRCA negative 07/2018   MyRisk neg   Family history of breast cancer    IBIS=13%/riskscore=8.4%   Family history of ovarian cancer    Family history of pancreatic cancer    GERD (gastroesophageal reflux disease)    History of frequent urinary tract infections    Hypoglycemia    IBS (irritable bowel syndrome)    Migraine headache    linger for days when they happen   Vertigo    none for several years    Past Surgical History:  Procedure Laterality Date   ABDOMINAL HYSTERECTOMY  07/2006   PARTIAL   BACK SURGERY     CHOLECYSTECTOMY     DIAGNOSTIC LAPAROSCOPY     test on bladder   GALLBLADDER SURGERY     LAPAROSCOPIC APPENDECTOMY N/A 12/08/2016   Procedure: APPENDECTOMY LAPAROSCOPIC;  Surgeon: Jules Husbands, MD;  Location: [facility] ORS;  Service: General;  Laterality: N/A;   MAXIMUM ACCESS (MAS)POSTERIOR LUMBAR INTERBODY FUSION (PLIF) 1 LEVEL N/A 05/06/2013   Procedure: LUMBAR FIVE-SACRAL ONE MAXIMUM ACCESS POSTERIOR LUMBAR INTERBODY FUSION;  Surgeon: Eustace Moore, MD;  Location: [place] [facility];  Service: Neurosurgery;  Laterality: N/A;   TOE SURGERY     TUBAL LIGATION     WISDOM TOOTH EXTRACTION      Prior to Admission medications   Medication Sig Start Date End Date Taking? Authorizing Provider  bacitracin-polymyxin b (POLYSPORIN) ophthalmic ointment Place into the left eye 3 (three) times daily. Bring home the current vial 06/13/22  Yes Sharen Hones, MD  FLUoxetine (PROZAC) 40 MG capsule Take 1 capsule (40 mg total) by mouth daily. 05/09/22  Yes Tally Joe T, FNP  hydrOXYzine (VISTARIL) 25 MG capsule  TAKE 1 CAPSULE BY MOUTH EVERY 8 HOURS AS NEEDED 04/26/22  Yes Ostwalt, Letitia Libra, PA-C  omeprazole (PRILOSEC) 40 MG capsule Take 1 capsule (40 mg total) by mouth daily. 06/20/22  Yes Felecia Stanfill, Tally Due, MD  rosuvastatin (CRESTOR) 10 MG tablet Take 1 tablet (10 mg total) by mouth daily. 06/24/22   Gwyneth Sprout, FNP  traZODone (DESYREL) 100 MG tablet Take 1-2 tablets PO as needed to assist with sleep 04/03/22  Yes Gwyneth Sprout, FNP  valACYclovir (VALTREX) 500 MG tablet Take 2 tablets (1,000 mg total) by mouth 2 (two) times daily. 03/18/22  Yes Gwyneth Sprout, FNP  chlorpheniramine-HYDROcodone (TUSSIONEX) 10-8 MG/5ML Take 5 mLs by mouth every 12 (twelve) hours as needed for cough. 06/21/22   Gwyneth Sprout, FNP  cyclobenzaprine (FLEXERIL) 10 MG tablet Take 1 tablet (10 mg total) by mouth 3 (three) times daily as needed for muscle spasms (neck pain/spasms). 06/04/22   Duffy Bruce, MD  metoCLOPramide (REGLAN) 5 MG tablet Take 1 tablet (5 mg total) by mouth 4 (four) times daily. 06/21/22   Gwyneth Sprout, FNP    Allergies as of 06/20/2022 - Review Complete 06/20/2022  Allergen Reaction Noted   Codeine Nausea And Vomiting 03/10/2013   Sulfa antibiotics Rash 03/10/2013   Vicodin [hydrocodone-acetaminophen] Nausea Only 03/12/2013    Family History  Problem Relation Age of Onset   Hypertension Mother  COPD Mother    Epilepsy Mother    Sleep apnea Mother    Hypertension Father    Sleep apnea Father    Breast cancer Maternal Aunt 63   Ovarian cancer Maternal Aunt 45   Colon cancer Paternal Uncle 72   Pancreatic cancer Maternal Grandmother 59   Colon cancer Maternal Grandfather 63    Social History   Socioeconomic History   Marital status: Legally Separated    Spouse name: Not on file   Number of children: Not on file   Years of education: Not on file   Highest education level: Not on file  Occupational History   Not on file  Tobacco Use   Smoking status: Every Day    Packs/day: 0.75     Years: 30.00    Total pack years: 22.50    Types: Cigarettes    Last attempt to quit: 05/30/2018    Years since quitting: 4.0   Smokeless tobacco: Never   Tobacco comments:    Started smoking around age 39  Vaping Use   Vaping Use: Some days  Substance and Sexual Activity   Alcohol use: Not Currently    Comment: was 40 beers/wk.  None since 06/03/22   Drug use: No   Sexual activity: Yes    Partners: Female, Female    Birth control/protection: Surgical    Comment: Hysterectomy  Other Topics Concern   Not on file  Social History Narrative   Not on file   Social Determinants of Health   Financial Resource Strain: High Risk (09/18/2021)   Overall Financial Resource Strain (CARDIA)    Difficulty of Paying Living Expenses: Hard  Food Insecurity: No Food Insecurity (06/07/2022)   Hunger Vital Sign    Worried About Running Out of Food in the Last Year: Never true    Ran Out of Food in the Last Year: Never true  Transportation Needs: No Transportation Needs (06/07/2022)   PRAPARE - Hydrologist (Medical): No    Lack of Transportation (Non-Medical): No  Physical Activity: Not on file  Stress: Not on file  Social Connections: Socially Isolated (09/18/2021)   Social Connection and Isolation Panel [NHANES]    Frequency of Communication with Friends and Family: More than three times a week    Frequency of Social Gatherings with Friends and Family: Three times a week    Attends Religious Services: Never    Active Member of Clubs or Organizations: No    Attends Archivist Meetings: Never    Marital Status: Separated  Intimate Partner Violence: Not At Risk (06/07/2022)   Humiliation, Afraid, Rape, and Kick questionnaire    Fear of Current or Ex-Partner: No    Emotionally Abused: No    Physically Abused: No    Sexually Abused: No    Review of Systems: See HPI, otherwise negative ROS  Physical Exam: BP 94/64   Pulse 91   Temp 98.1 F (36.7 C)  (Tympanic)   Resp 12   Ht 5' 5.98" (1.676 m)   Wt 86.6 kg   SpO2 98%   BMI 30.84 kg/m  General:   Alert,  pleasant and cooperative in NAD Head:  Normocephalic and atraumatic. Neck:  Supple; no masses or thyromegaly. Lungs:  Clear throughout to auscultation.    Heart:  Regular rate and rhythm. Abdomen:  Soft, nontender and nondistended. Normal bowel sounds, without guarding, and without rebound.   Neurologic:  Alert and  oriented x4;  grossly normal neurologically.  Impression/Plan: [name] is here for an endoscopy to be performed for dyspepsia  Risks, benefits, limitations, and alternatives regarding  endoscopy have been reviewed with the patient.  Questions have been answered.  All parties agreeable.   Lannette Donath, MD  06/25/2022, 9:11 AM

## 2022-06-25 NOTE — Telephone Encounter (Signed)
Informed patient of this information ?

## 2022-06-25 NOTE — Anesthesia Postprocedure Evaluation (Signed)
Anesthesia Post Note  Patient: Aniela Veldhuizen  Procedure(s) Performed: ESOPHAGOGASTRODUODENOSCOPY (EGD) WITH PROPOFOL  Patient location during evaluation: PACU Anesthesia Type: General Level of consciousness: awake and alert, oriented and patient cooperative Pain management: pain level controlled Vital Signs Assessment: post-procedure vital signs reviewed and stable Respiratory status: spontaneous breathing, nonlabored ventilation and respiratory function stable Cardiovascular status: blood pressure returned to baseline and stable Postop Assessment: adequate PO intake Anesthetic complications: no   No notable events documented.   Last Vitals:  Vitals:   06/25/22 0957 06/25/22 1000  BP:  111/80  Pulse: 83 80  Resp: 14 20  Temp:    SpO2: 96% 96%    Last Pain:  Vitals:   06/25/22 1000  TempSrc:   PainSc: 0-No pain                 Darrin Nipper

## 2022-06-26 ENCOUNTER — Encounter: Payer: Self-pay | Admitting: Gastroenterology

## 2022-06-26 ENCOUNTER — Encounter: Payer: Self-pay | Admitting: Family Medicine

## 2022-06-27 ENCOUNTER — Telehealth: Payer: Self-pay

## 2022-06-27 DIAGNOSIS — A048 Other specified bacterial intestinal infections: Secondary | ICD-10-CM

## 2022-06-27 LAB — SURGICAL PATHOLOGY

## 2022-06-27 MED ORDER — CLARITHROMYCIN 500 MG PO TABS: 500.0000 mg | ORAL_TABLET | Freq: Two times a day (BID) | ORAL | 0 refills | Status: AC

## 2022-06-27 MED ORDER — AMOXICILLIN 500 MG PO TABS: 1000.0000 mg | ORAL_TABLET | Freq: Two times a day (BID) | ORAL | 0 refills | Status: AC

## 2022-06-27 MED ORDER — OMEPRAZOLE 20 MG PO CPDR: 20.0000 mg | DELAYED_RELEASE_CAPSULE | Freq: Two times a day (BID) | ORAL | 0 refills | Status: DC

## 2022-06-27 NOTE — Telephone Encounter (Signed)
Patient verbalized understanding of results. She states she will come back in 6 weeks for lab test. Informed patient in 4 weeks I would call her and remind her to come off the omeprazole so she can do the lab test

## 2022-06-27 NOTE — Telephone Encounter (Signed)
-----  Message from Lin Landsman, MD sent at 06/27/2022 11:50 AM EST ----- Please inform patient that the pathology results from upper endoscopy confirm H. pylori gastritis Recommend treatment for it, please send in the prescription for triple therapy to treat H Pylori for 14days  Omeprazole 20mg  BID Clarithromycin 500mg  BID Amoxicillin 1gm BID  Order H Pylori breath test in 4weeks after completing medication to confirm eradication.  Patient should be off prilosec and H2 blocker atleast for 2weeks before the test  Thanks RV

## 2022-07-03 ENCOUNTER — Other Ambulatory Visit: Payer: Self-pay | Admitting: Student

## 2022-07-03 DIAGNOSIS — R1013 Epigastric pain: Secondary | ICD-10-CM

## 2022-07-03 MED ORDER — HYDROCOD POLI-CHLORPHE POLI ER 10-8 MG/5ML PO SUER: 5.0000 mL | Freq: Two times a day (BID) | ORAL | 0 refills | Status: DC | PRN

## 2022-07-03 NOTE — Progress Notes (Signed)
Patient for US guided Liver Biopsy on Thurs 07/04/2022, I called and spoke with the patient on the phone and gave pre-procedure instructions. Pt was made aware to be here at 7:30a at the new entrance, NPO after MN prior to procedure as well as driver post procedure/recovery/discharge. Pt stated understanding.  Called 07/03/2022

## 2022-07-04 ENCOUNTER — Ambulatory Visit
Admission: RE | Admit: 2022-07-04 | Discharge: 2022-07-04 | Disposition: A | Discharge status: Home / Self Care | Payer: 59 | Source: Ambulatory Visit | Attending: Gastroenterology | Admitting: Gastroenterology

## 2022-07-04 ENCOUNTER — Other Ambulatory Visit: Payer: Self-pay

## 2022-07-04 DIAGNOSIS — R7989 Other specified abnormal findings of blood chemistry: Secondary | ICD-10-CM | POA: Insufficient documentation

## 2022-07-04 DIAGNOSIS — K760 Fatty (change of) liver, not elsewhere classified: Secondary | ICD-10-CM | POA: Insufficient documentation

## 2022-07-04 DIAGNOSIS — R1013 Epigastric pain: Secondary | ICD-10-CM

## 2022-07-04 LAB — CBC
HCT: 37.3 % (ref 36.0–46.0)
Hemoglobin: 12.6 g/dL (ref 12.0–15.0)
MCH: 30.3 pg (ref 26.0–34.0)
MCHC: 33.8 g/dL (ref 30.0–36.0)
MCV: 89.7 fL (ref 80.0–100.0)
Platelets: 270 10*3/uL (ref 150–400)
RBC: 4.16 MIL/uL (ref 3.87–5.11)
RDW: 13.3 % (ref 11.5–15.5)
WBC: 8.2 10*3/uL (ref 4.0–10.5)
nRBC: 0 % (ref 0.0–0.2)

## 2022-07-04 LAB — PROTIME-INR
INR: 1 (ref 0.8–1.2)
Prothrombin Time: 13.1 seconds (ref 11.4–15.2)

## 2022-07-04 MED ORDER — FENTANYL CITRATE (PF) 100 MCG/2ML IJ SOLN
INTRAMUSCULAR | Status: AC
  Filled 2022-07-04: qty 2

## 2022-07-04 MED ORDER — LIDOCAINE HCL (PF) 1 % IJ SOLN
10.0000 mL | Freq: Once | INTRAMUSCULAR | Status: AC
  Administered 2022-07-04: 10 mL via INTRADERMAL

## 2022-07-04 MED ORDER — SODIUM CHLORIDE 0.9 % IV SOLN: INTRAVENOUS | Status: DC

## 2022-07-04 MED ORDER — FENTANYL CITRATE (PF) 100 MCG/2ML IJ SOLN
INTRAMUSCULAR | Status: AC | PRN
  Administered 2022-07-04 (×2): 50 ug via INTRAVENOUS

## 2022-07-04 MED ORDER — MIDAZOLAM HCL 5 MG/5ML IJ SOLN
INTRAMUSCULAR | Status: AC | PRN
  Administered 2022-07-04 (×2): 1 mg via INTRAVENOUS

## 2022-07-04 MED ORDER — OXYCODONE HCL 5 MG PO TABS
ORAL_TABLET | ORAL | Status: AC
  Filled 2022-07-04: qty 1

## 2022-07-04 MED ORDER — OXYCODONE HCL 5 MG PO TABS
5.0000 mg | ORAL_TABLET | Freq: Once | ORAL | Status: AC
  Administered 2022-07-04: 5 mg via ORAL

## 2022-07-04 MED ORDER — MIDAZOLAM HCL 2 MG/2ML IJ SOLN
INTRAMUSCULAR | Status: AC
  Filled 2022-07-04: qty 2

## 2022-07-04 NOTE — H&P (Signed)
Chief Complaint: Patient was seen in consultation today for elevated LFTs; non-focal liver biopsy  Referring Physician(s): Toney Reil  Supervising Physician: Irish Lack  Patient Status: [facility] - Out-pt  History of Present Illness: [name] is a 47 y.o. female with a medical history significant for alcohol/tobacco use, IBS and migraines. She was referred to GI for work up of dyspepsia and elevated LFTs. Upper endoscopy and additional work up has been negative but the elevated LFTs persist.   Interventional Radiology has been asked to evaluate this patient for an image-guided non-focal liver biopsy for further work up.   Past Medical History:  Diagnosis Date   BRCA negative 07/2018   MyRisk neg   Family history of breast cancer    IBIS=13%/riskscore=8.4%   Family history of ovarian cancer    Family history of pancreatic cancer    GERD (gastroesophageal reflux disease)    History of frequent urinary tract infections    Hypoglycemia    IBS (irritable bowel syndrome)    Migraine headache    linger for days when they happen   Vertigo    none for several years    Past Surgical History:  Procedure Laterality Date   ABDOMINAL HYSTERECTOMY  07/2006   PARTIAL   BACK SURGERY     CHOLECYSTECTOMY     DIAGNOSTIC LAPAROSCOPY     test on bladder   ESOPHAGOGASTRODUODENOSCOPY (EGD) WITH PROPOFOL N/A 06/25/2022   Procedure: ESOPHAGOGASTRODUODENOSCOPY (EGD) WITH PROPOFOL;  Surgeon: Toney Reil, MD;  Location: [facility];  Service: Endoscopy;  Laterality: N/A;   GALLBLADDER SURGERY     LAPAROSCOPIC APPENDECTOMY N/A 12/08/2016   Procedure: APPENDECTOMY LAPAROSCOPIC;  Surgeon: Leafy Ro, MD;  Location: [facility] ORS;  Service: General;  Laterality: N/A;   MAXIMUM ACCESS (MAS)POSTERIOR LUMBAR INTERBODY FUSION (PLIF) 1 LEVEL N/A 05/06/2013   Procedure: LUMBAR FIVE-SACRAL ONE MAXIMUM ACCESS POSTERIOR LUMBAR INTERBODY FUSION;  Surgeon: Tia Alert, MD;   Location: [facility];  Service: Neurosurgery;  Laterality: N/A;   TOE SURGERY     TUBAL LIGATION     WISDOM TOOTH EXTRACTION      Allergies: Codeine, Sulfa antibiotics, and Vicodin [hydrocodone-acetaminophen]  Medications: Prior to Admission medications   Medication Sig Start Date End Date Taking? Authorizing Provider  amoxicillin (AMOXIL) 500 MG tablet Take 2 tablets (1,000 mg total) by mouth 2 (two) times daily for 14 days. 06/27/22 07/11/22 Yes Vanga, Loel Dubonnet, MD  bacitracin-polymyxin b (POLYSPORIN) ophthalmic ointment Place into the left eye 3 (three) times daily. Bring home the current vial 06/13/22  Yes Marrion Coy, MD  chlorpheniramine-HYDROcodone (TUSSIONEX) 10-8 MG/5ML Take 5 mLs by mouth every 12 (twelve) hours as needed for cough. 06/21/22  Yes [name], FNP  clarithromycin (BIAXIN) 500 MG tablet Take 1 tablet (500 mg total) by mouth 2 (two) times daily for 14 days. 06/27/22 07/11/22 Yes Vanga, Loel Dubonnet, MD  co-enzyme Q-10 30 MG capsule Take 30 mg by mouth 3 (three) times daily.   Yes [provider]  cyclobenzaprine (FLEXERIL) 10 MG tablet Take 1 tablet (10 mg total) by mouth 3 (three) times daily as needed for muscle spasms (neck pain/spasms). 06/04/22  Yes Shaune Pollack, MD  FLUoxetine (PROZAC) 40 MG capsule Take 1 capsule (40 mg total) by mouth daily. 05/09/22  Yes [name], FNP  hydrOXYzine (ATARAX) 25 MG tablet Take 2 tablets (50 mg total) by mouth every 8 (eight) hours as needed for itching. Take 1-2 tablets at bedtime 06/25/22  08/24/22 Yes Vanga, Loel Dubonnet, MD  Magnesium 400 MG CAPS Take 400 mg by mouth daily.   Yes [provider]  metoCLOPramide (REGLAN) 5 MG tablet Take 1 tablet (5 mg total) by mouth 4 (four) times daily. 06/21/22  Yes [name], FNP  omeprazole (PRILOSEC) 20 MG capsule Take 1 capsule (20 mg total) by mouth 2 (two) times daily before a meal for 14 days. 06/27/22 07/11/22 Yes Vanga, Loel Dubonnet, MD  rosuvastatin  (CRESTOR) 10 MG tablet Take 1 tablet (10 mg total) by mouth daily. 06/24/22  Yes [name], FNP  traZODone (DESYREL) 100 MG tablet Take 1-2 tablets PO as needed to assist with sleep 04/03/22  Yes Merita Norton T, FNP  chlorpheniramine-HYDROcodone (TUSSIONEX) 10-8 MG/5ML Take 5 mLs by mouth every 12 (twelve) hours as needed for cough. 07/03/22   [name], FNP  omeprazole (PRILOSEC) 40 MG capsule Take 1 capsule (40 mg total) by mouth daily. 06/20/22   Toney Reil, MD  valACYclovir (VALTREX) 500 MG tablet Take 2 tablets (1,000 mg total) by mouth 2 (two) times daily. Patient not taking: Reported on 07/04/2022 03/18/22   [name], FNP     Family History  Problem Relation Age of Onset   Hypertension Mother    COPD Mother    Epilepsy Mother    Sleep apnea Mother    Hypertension Father    Sleep apnea Father    Breast cancer Maternal Aunt 41   Ovarian cancer Maternal Aunt 45   Colon cancer Paternal Uncle 24   Pancreatic cancer Maternal Grandmother 75   Colon cancer Maternal Grandfather 15    Social History   Socioeconomic History   Marital status: Legally Separated    Spouse name: Not on file   Number of children: Not on file   Years of education: Not on file   Highest education level: Not on file  Occupational History   Not on file  Tobacco Use   Smoking status: Every Day    Packs/day: 0.75    Years: 30.00    Total pack years: 22.50    Types: Cigarettes    Last attempt to quit: 05/30/2018    Years since quitting: 4.0   Smokeless tobacco: Never   Tobacco comments:    Started smoking around age 40  Vaping Use   Vaping Use: Some days  Substance and Sexual Activity   Alcohol use: Not Currently    Comment: was 40 beers/wk.  None since 06/03/22   Drug use: No   Sexual activity: Yes    Partners: Female, Female    Birth control/protection: Surgical    Comment: Hysterectomy  Other Topics Concern   Not on file  Social History Narrative   Not on file   Social  Determinants of Health   Financial Resource Strain: High Risk (09/18/2021)   Overall Financial Resource Strain (CARDIA)    Difficulty of Paying Living Expenses: Hard  Food Insecurity: No Food Insecurity (06/07/2022)   Hunger Vital Sign    Worried About Running Out of Food in the Last Year: Never true    Ran Out of Food in the Last Year: Never true  Transportation Needs: No Transportation Needs (06/07/2022)   PRAPARE - Administrator, Civil Service (Medical): No    Lack of Transportation (Non-Medical): No  Physical Activity: Not on file  Stress: Not on file  Social Connections: Socially Isolated (09/18/2021)   Social Connection and Isolation Panel [NHANES]  Frequency of Communication with Friends and Family: More than three times a week    Frequency of Social Gatherings with Friends and Family: Three times a week    Attends Religious Services: Never    Active Member of Clubs or Organizations: No    Attends Archivist Meetings: Never    Marital Status: Separated    Review of Systems: A 12 point ROS discussed and pertinent positives are indicated in the HPI above.  All other systems are negative.  Review of Systems  Constitutional:  Negative for appetite change and fatigue.  Respiratory:  Negative for cough and shortness of breath.   Cardiovascular:  Negative for chest pain and leg swelling.  Gastrointestinal:  Positive for abdominal pain. Negative for diarrhea, nausea and vomiting.       Intermittent RUQ tenderness  Musculoskeletal:  Negative for back pain.  Neurological:  Positive for headaches.    Vital Signs: BP 123/85   Pulse 98   Temp 99 F (37.2 C) (Oral)   Resp 17   Ht 5\' 6"  (1.676 m)   Wt 189 lb (85.7 kg)   SpO2 94%   BMI 30.51 kg/m   Physical Exam Constitutional:      General: She is not in acute distress.    Appearance: She is not ill-appearing.  HENT:     Mouth/Throat:     Mouth: Mucous membranes are moist.     Pharynx: Oropharynx is  clear.  Cardiovascular:     Rate and Rhythm: Normal rate and regular rhythm.     Pulses: Normal pulses.     Heart sounds: Normal heart sounds.  Pulmonary:     Effort: Pulmonary effort is normal.     Breath sounds: Normal breath sounds.  Abdominal:     General: Bowel sounds are normal.     Palpations: Abdomen is soft.     Tenderness: There is abdominal tenderness.  Musculoskeletal:        General: Normal range of motion.     Right lower leg: No edema.     Left lower leg: No edema.  Skin:    General: Skin is warm and dry.  Neurological:     Mental Status: She is alert and oriented to person, place, and time.  Psychiatric:        Mood and Affect: Mood normal.        Behavior: Behavior normal.        Thought Content: Thought content normal.        Judgment: Judgment normal.     Imaging: ECHOCARDIOGRAM COMPLETE  Result Date: 06/08/2022    ECHOCARDIOGRAM REPORT   Patient Name:   Brandilyn Nanninga Date of Exam: 06/08/2022 Medical Rec #:  [id number]      Height:       66.0 in Accession #:    [id number]     Weight:       195.0 lb Date of Birth:  1976-02-03      BSA:          1.979 m Patient Age:    72 years       BP:           117/71 mmHg Patient Gender: F              HR:           86 bpm. Exam Location:  [facility] Procedure: 2D Echo Indications:     Fever R50.9  History:  Patient has no prior history of Echocardiogram examinations.  Sonographer:     Kathlen Brunswick RDCS Referring Phys:  Tennis Must MD Diagnosing Phys: [place]  1. Left ventricular ejection fraction, by estimation, is 60 to 65%. The left ventricle has normal function. The left ventricle has no regional wall motion abnormalities. Left ventricular diastolic parameters are consistent with Grade I diastolic dysfunction (impaired relaxation).  2. Right ventricular systolic function is normal. The right ventricular size is normal.  3. The mitral valve is normal in structure. Mild mitral valve regurgitation. No evidence  of mitral stenosis.  4. The aortic valve is normal in structure. Aortic valve regurgitation is not visualized. No aortic stenosis is present.  5. The inferior vena cava is normal in size with greater than 50% respiratory variability, suggesting right atrial pressure of 3 mmHg. FINDINGS  Left Ventricle: Left ventricular ejection fraction, by estimation, is 60 to 65%. The left ventricle has normal function. The left ventricle has no regional wall motion abnormalities. The left ventricular internal cavity size was normal in size. There is  no left ventricular hypertrophy. Left ventricular diastolic parameters are consistent with Grade I diastolic dysfunction (impaired relaxation). Right Ventricle: The right ventricular size is normal. No increase in right ventricular wall thickness. Right ventricular systolic function is normal. Left Atrium: Left atrial size was normal in size. Right Atrium: Right atrial size was normal in size. Pericardium: Trivial pericardial effusion is present. The pericardial effusion is circumferential. Mitral Valve: The mitral valve is normal in structure. Mild mitral valve regurgitation. No evidence of mitral valve stenosis. Tricuspid Valve: The tricuspid valve is normal in structure. Tricuspid valve regurgitation is trivial. No evidence of tricuspid stenosis. Aortic Valve: The aortic valve is normal in structure. Aortic valve regurgitation is not visualized. No aortic stenosis is present. Pulmonic Valve: The pulmonic valve was normal in structure. Pulmonic valve regurgitation is trivial. No evidence of pulmonic stenosis. Aorta: The aortic root is normal in size and structure. Venous: The inferior vena cava is normal in size with greater than 50% respiratory variability, suggesting right atrial pressure of 3 mmHg. IAS/Shunts: No atrial level shunt detected by color flow Doppler.  LEFT VENTRICLE PLAX 2D LVIDd:         3.80 cm   Diastology LVIDs:         2.10 cm   LV e' medial:    7.10 cm/s LV  PW:         0.80 cm   LV E/e' medial:  9.3 LV IVS:        1.00 cm   LV e' lateral:   5.30 cm/s LVOT diam:     1.60 cm   LV E/e' lateral: 12.4 LV SV:         45 LV SV Index:   23 LVOT Area:     2.01 cm  RIGHT VENTRICLE RV Basal diam:  2.90 cm TAPSE (M-mode): 2.3 cm LEFT ATRIUM           Index LA diam:      3.15 cm 1.59 cm/m LA Vol (A2C): 19.1 ml 9.65 ml/m LA Vol (A4C): 39.2 ml 19.81 ml/m  AORTIC VALVE             PULMONIC VALVE LVOT Vmax:   122.00 cm/s PV Vmax:       1.52 m/s LVOT Vmean:  79.700 cm/s PV Peak grad:  9.2 mmHg LVOT VTI:    0.225 m  MV E velocity: 65.80 cm/s  SHUNTS                            Systemic VTI:  0.22 m                            Systemic Diam: 1.60 cm Adrian Blackwater Electronically signed by Adrian Blackwater Signature Date/Time: 06/08/2022/3:51:14 PM    Final    MR Lumbar Spine W Wo Contrast  Result Date: 06/07/2022 CLINICAL DATA:  Low back pain with recurrent fevers, infection suspected; history of back surgery in 2014 EXAM: MRI LUMBAR SPINE WITHOUT AND WITH CONTRAST TECHNIQUE: Multiplanar and multiecho pulse sequences of the lumbar spine were obtained without and with intravenous contrast. CONTRAST:  69mL GADAVIST GADOBUTROL 1 MMOL/ML IV SOLN COMPARISON:  08/25/2012 MRI lumbar spine, correlation is also made with CT abdomen pelvis 06/06/2022 FINDINGS: Segmentation:  5 lumbar type vertebral bodies. Alignment:  Mild levocurvature.  No significant listhesis. Vertebrae: No acute fracture, evidence of discitis, or suspicious osseous lesion. Status post posterior fusion and decompression at L5-S1, with interbody disc spacer. Susceptibility artifact from the hardware limits evaluation at this level. Conus medullaris and cauda equina: Conus extends to the L2 level. Conus and cauda equina appear normal. No abnormal enhancement. Paraspinal and other soft tissues: No acute finding. Disc levels: T12-L1: No significant disc bulge. No spinal canal stenosis or neural foraminal  narrowing. L1-L2: No significant disc bulge. No spinal canal stenosis or neural foraminal narrowing. L2-L3: No significant disc bulge. Prominent epidural fat, which causes mild thecal sac narrowing. No osseous spinal canal stenosis. No neural foraminal narrowing. L3-L4: Mild disc bulge. Mild facet arthropathy. Prominent epidural fat. Mild thecal sac narrowing, without significant osseous stenosis. No neural foraminal narrowing. L4-L5: No significant disc bulge. Mild facet arthropathy. No spinal canal stenosis or neural foraminal narrowing. L5-S1: Status post fusion and decompression. No spinal canal stenosis or neural foraminal narrowing. IMPRESSION: 1. No evidence of infection in the lumbar spine. 2. Mild thecal sac narrowing at L2-L3 and L3-L4, secondary to prominent epidural fat. No osseous spinal canal stenosis or neural foraminal narrowing. Electronically Signed   By: Wiliam Ke M.D.   On: 06/07/2022 20:26   CT ABDOMEN PELVIS W CONTRAST  Result Date: 06/06/2022 CLINICAL DATA:  Hepatitis, acute.  Fever. EXAM: CT ABDOMEN AND PELVIS WITH CONTRAST TECHNIQUE: Multidetector CT imaging of the abdomen and pelvis was performed using the standard protocol following bolus administration of intravenous contrast. RADIATION DOSE REDUCTION: This exam was performed according to the departmental dose-optimization program which includes automated exposure control, adjustment of the mA and/or kV according to patient size and/or use of iterative reconstruction technique. CONTRAST:  OMNIPAQUE IOHEXOL 350 MG/ML SOLN COMPARISON:  CT 12/10/2021 FINDINGS: Lower chest: Assessed on concurrent chest CT, reported separately. Hepatobiliary: Diminished hepatic steatosis from prior exam. No focal hepatic abnormality. Liver is mildly enlarged spanning 20.1 cm, previously 19 cm on my retrospective measurement. No panic fluid collection. Clips in the gallbladder fossa postcholecystectomy. No biliary dilatation. Pancreas:  Unremarkable. No pancreatic ductal dilatation or surrounding inflammatory changes. Spleen: Normal in size without focal abnormality. Adrenals/Urinary Tract: Normal adrenal glands. No hydronephrosis, perinephric edema or renal calculi. No focal renal abnormality. Unremarkable urinary bladder. Stomach/Bowel: Ingested material distends the stomach, there is no gastric wall thickening. No bowel obstruction or inflammatory change. Small-moderate volume of stool in the colon. Prior appendectomy. Vascular/Lymphatic: Mild atherosclerosis of the abdominal aorta, no aneurysm. Patent  portal, splenic and mesenteric veins. No adenopathy. Reproductive: Hysterectomy.  No adnexal mass. Other: Small amount of free fluid in the dependent pelvis, query small amount of adjacent fat stranding. No upper abdominal or perihepatic ascites. No free air or focal fluid collection. No abdominal wall hernia. Musculoskeletal: L5-S1 posterior lumbar fusion with interbody spacer. There are no acute or suspicious osseous abnormalities. IMPRESSION: 1. Small amount of free fluid in the dependent pelvis, query small amount of adjacent fat stranding. This is nonspecific. 2. Mild hepatomegaly. Subjectively diminished hepatic steatosis from prior exam. No focal liver lesion. Aortic Atherosclerosis (ICD10-I70.0). Electronically Signed   By: Narda RutherfordMelanie  Sanford M.D.   On: 06/06/2022 23:37   CT Angio Chest Pulmonary Embolism (PE) W or WO Contrast  Result Date: 06/06/2022 CLINICAL DATA:  Pulmonary embolism (PE) suspected, low to intermediate prob, positive D-dimer Fever. EXAM: CT ANGIOGRAPHY CHEST WITH CONTRAST TECHNIQUE: Multidetector CT imaging of the chest was performed using the standard protocol during bolus administration of intravenous contrast. Multiplanar CT image reconstructions and MIPs were obtained to evaluate the vascular anatomy. RADIATION DOSE REDUCTION: This exam was performed according to the departmental dose-optimization program which  includes automated exposure control, adjustment of the mA and/or kV according to patient size and/or use of iterative reconstruction technique. CONTRAST:  100mL OMNIPAQUE IOHEXOL 350 MG/ML SOLN COMPARISON:  Most recent chest radiograph 08/22/2020 FINDINGS: Cardiovascular: There are no filling defects within the pulmonary arteries to suggest pulmonary embolus. Thoracic aorta is normal in caliber without dissection or acute findings. The heart is normal in size. No pericardial effusion. Mediastinum/Nodes: Scattered small mediastinal and hilar lymph nodes, none of which are enlarged node size criteria. No axillary adenopathy. No esophageal wall thickening. Lungs/Pleura: Dependent hypoventilatory changes. Trace bilateral pleural thickening versus tiny effusions. Subsegmental atelectasis in the lingula. Mild central bronchitic change. No pulmonary mass. Upper Abdomen: Assessed on concurrent abdominopelvic CT, reported separately. Musculoskeletal: There are no acute or suspicious osseous abnormalities. No chest wall soft tissue abnormalities. Review of the MIP images confirms the above findings. IMPRESSION: 1. No pulmonary embolus. 2. Trace bilateral pleural thickening versus tiny effusions. 3. Mild central bronchitic change, can be seen with bronchitis or reactive airways disease. Electronically Signed   By: Narda RutherfordMelanie  Sanford M.D.   On: 06/06/2022 23:30   MR Brain W and Wo Contrast  Result Date: 06/06/2022 CLINICAL DATA:  Sudden headache EXAM: MRI HEAD WITHOUT AND WITH CONTRAST MRV HEAD WITHOUT CONTRAST TECHNIQUE: Multiplanar, multiecho pulse sequences of the brain and surrounding structures were obtained without and with intravenous contrast. Angiographic images of the intracranial venous structures were obtained using MRV technique without intravenous contrast. CONTRAST:  8mL GADAVIST GADOBUTROL 1 MMOL/ML IV SOLN COMPARISON:  Same-day CT head FINDINGS: MRI HEAD FINDINGS Brain: No acute infarction, hemorrhage,  hydrocephalus, extra-axial collection or mass lesion. No pathologic intracranial enhancement. Focal contrast-enhancing lesion. Vascular: Normal flow voids. Skull and upper cervical spine: Normal marrow signal. Sinuses/Orbits: Polypoid mucosal thickening right maxillary sinus. Trace right mastoid effusion. Other: None MRV HEAD FINDINGS There is no evidence of dural venous sinus or deep cerebral vein thrombosis. No dural venous sinus stenosis. IMPRESSION: 1. No acute intracranial abnormality. 2. No evidence of dural venous sinus thrombosis. Electronically Signed   By: Lorenza CambridgeHemant  Desai M.D.   On: 06/06/2022 19:09   MR MRV HEAD W WO CONTRAST  Result Date: 06/06/2022 CLINICAL DATA:  Sudden headache EXAM: MRI HEAD WITHOUT AND WITH CONTRAST MRV HEAD WITHOUT CONTRAST TECHNIQUE: Multiplanar, multiecho pulse sequences of the brain and surrounding structures were  obtained without and with intravenous contrast. Angiographic images of the intracranial venous structures were obtained using MRV technique without intravenous contrast. CONTRAST:  8mL GADAVIST GADOBUTROL 1 MMOL/ML IV SOLN COMPARISON:  Same-day CT head FINDINGS: MRI HEAD FINDINGS Brain: No acute infarction, hemorrhage, hydrocephalus, extra-axial collection or mass lesion. No pathologic intracranial enhancement. Focal contrast-enhancing lesion. Vascular: Normal flow voids. Skull and upper cervical spine: Normal marrow signal. Sinuses/Orbits: Polypoid mucosal thickening right maxillary sinus. Trace right mastoid effusion. Other: None MRV HEAD FINDINGS There is no evidence of dural venous sinus or deep cerebral vein thrombosis. No dural venous sinus stenosis. IMPRESSION: 1. No acute intracranial abnormality. 2. No evidence of dural venous sinus thrombosis. Electronically Signed   By: Lorenza CambridgeHemant  Desai M.D.   On: 06/06/2022 19:09   CT Head Wo Contrast  Result Date: 06/04/2022 CLINICAL DATA:  Meningitis/CNS infection suspected. EXAM: CT HEAD WITHOUT CONTRAST TECHNIQUE:  Contiguous axial images were obtained from the base of the skull through the vertex without intravenous contrast. RADIATION DOSE REDUCTION: This exam was performed according to the departmental dose-optimization program which includes automated exposure control, adjustment of the mA and/or kV according to patient size and/or use of iterative reconstruction technique. COMPARISON:  08/31/2015 FINDINGS: Brain: No evidence of acute infarction, hemorrhage, hydrocephalus, extra-axial collection or mass lesion/mass effect. Vascular: No hyperdense vessel or unexpected calcification. Skull: Normal. Negative for fracture or focal lesion. Sinuses/Orbits: Paranasal sinuses and mastoid air cells are clear. Other: None IMPRESSION: No acute intracranial abnormalities. Electronically Signed   By: Signa Kellaylor  Stroud M.D.   On: 06/04/2022 14:21    Labs:  CBC: Recent Labs    06/11/22 0458 06/12/22 0401 06/21/22 1041 07/04/22 0758  WBC 6.9 6.0 11.2* 8.2  HGB 10.8* 11.5* 13.5 12.6  HCT 32.9* 35.3* 40.3 37.3  PLT 211 214 291 270    COAGS: Recent Labs    07/04/22 0758  INR 1.0    BMP: Recent Labs    06/09/22 0347 06/10/22 0235 06/11/22 0458 06/12/22 0401 06/21/22 1041  NA 138 138 139 139 135  K 3.6 4.0 3.5 4.1 4.8  CL 105 105 108 107 97  CO2 24 25 24 25 24   GLUCOSE 91 125* 90 88 104*  BUN 6 8 6  5* 7  CALCIUM 8.1* 8.5* 7.8* 8.0* 9.2  CREATININE 0.78 0.67 0.74 0.59 0.87  GFRNONAA >60 >60 >60 >60  --     LIVER FUNCTION TESTS: Recent Labs    06/09/22 0343 06/10/22 0232 06/12/22 0401 06/20/22 1438  BILITOT 0.7 0.7 0.3 0.3  AST 61* 72* 108* 66*  ALT 185* 170* 178* 92*  ALKPHOS 248* 246* 279* 304*  PROT 6.2* 6.6 6.2* 7.3  ALBUMIN 2.9* 3.0* 3.0* 3.8*    TUMOR MARKERS: No results for input(s): "AFPTM", "CEA", "CA199", "CHROMGRNA" in the last 8760 hours.  Assessment and Plan:  Elevated LFTs: [name], 47 year old female, [facility]  Interventional Radiology department for an image-guided non-focal liver biopsy.   Risks and benefits of this procedure were discussed with the patient and/or patient's family including, but not limited to bleeding, infection, damage to adjacent structures or low yield requiring additional tests.  All of the questions were answered and there is agreement to proceed. She has been NPO. She does not take any blood-thinning medications.   Consent signed and in chart.  Thank you for this interesting consult.  I greatly enjoyed meeting [name] and look forward to participating in their care.  A copy  of this report was sent to the requesting provider on this date.  Electronically Signed: Soyla Dryer, AGACNP-BC [phone] 07/04/2022, 8:31 AM   I spent a total of  30 Minutes   in face to face in clinical consultation, greater than 50% of which was counseling/coordinating care for non-focal liver biopsy

## 2022-07-04 NOTE — Procedures (Signed)
Interventional Radiology Procedure Note ° °Procedure: US Guided Biopsy of liver ° °Complications: None ° °Estimated Blood Loss: < 10 mL ° °Findings: °18 G core biopsy of liver performed under US guidance.  Three core samples obtained and sent to Pathology. ° °[name] T. Zaydyn Havey, M.D °Pager:  [phone] ° °  °

## 2022-07-04 NOTE — Progress Notes (Signed)
Patient clinically stable post Liver Biopsy per Dr Kathlene Cote, tolerated well, although awake during procedure, received Versed 2 mg along with Fentanyl 100 mcg IV for procedure. Vitals stable pre and post procedure. Report given to [facility] RN post procedure/331

## 2022-07-05 ENCOUNTER — Encounter: Payer: Self-pay | Admitting: Family Medicine

## 2022-07-08 ENCOUNTER — Telehealth: Payer: Self-pay

## 2022-07-08 DIAGNOSIS — R7989 Other specified abnormal findings of blood chemistry: Secondary | ICD-10-CM

## 2022-07-08 LAB — SURGICAL PATHOLOGY

## 2022-07-08 NOTE — Telephone Encounter (Signed)
Patient verbalized understanding of results. She states she is also supposed to repeat the H pylori breath test in 2 weeks. She states she is going to wait to do both at the same time.

## 2022-07-08 NOTE — Telephone Encounter (Signed)
-----   Message from Lin Landsman, MD sent at 07/08/2022  1:29 PM EST ----- [name]  Please inform patient that the liver biopsy results confirm her liver enzymes are elevated secondary to fatty liver.  Part of her liver enzymes appear to improve. Strictly recommend her to follow low-fat, low-carb diet, exercise.  Also, recommend to check vitamin D levels  Will see her for follow-up as scheduled  RV

## 2022-07-11 ENCOUNTER — Ambulatory Visit: Payer: 59 | Admitting: Infectious Diseases

## 2022-07-16 ENCOUNTER — Ambulatory Visit: Payer: 59 | Admitting: Infectious Diseases

## 2022-07-17 ENCOUNTER — Encounter: Payer: Self-pay | Admitting: Family Medicine

## 2022-07-17 ENCOUNTER — Other Ambulatory Visit: Payer: Self-pay | Admitting: Family Medicine

## 2022-07-17 DIAGNOSIS — E162 Hypoglycemia, unspecified: Secondary | ICD-10-CM

## 2022-07-17 MED ORDER — BLOOD GLUCOSE TEST VI STRP: 1.0000 | ORAL_STRIP | Freq: Two times a day (BID) | 0 refills | Status: AC | PRN

## 2022-07-17 MED ORDER — LANCETS MISC. MISC: 1.0000 | Freq: Two times a day (BID) | 0 refills | Status: AC | PRN

## 2022-07-17 MED ORDER — BLOOD GLUCOSE MONITORING SUPPL DEVI: 1.0000 | Freq: Two times a day (BID) | 0 refills | Status: AC | PRN

## 2022-07-17 MED ORDER — LANCET DEVICE MISC: 1.0000 | Freq: Two times a day (BID) | 0 refills | Status: AC | PRN

## 2022-07-22 ENCOUNTER — Other Ambulatory Visit: Payer: Self-pay | Admitting: Gastroenterology

## 2022-07-22 DIAGNOSIS — R1013 Epigastric pain: Secondary | ICD-10-CM

## 2022-07-23 ENCOUNTER — Encounter: Payer: Self-pay | Admitting: Infectious Diseases

## 2022-07-23 ENCOUNTER — Ambulatory Visit: Payer: 59 | Attending: Infectious Diseases | Admitting: Infectious Diseases

## 2022-07-23 VITALS — BP 111/78 | HR 73 | Temp 98.7°F | Ht 66.0 in | Wt 190.0 lb

## 2022-07-23 DIAGNOSIS — K589 Irritable bowel syndrome without diarrhea: Secondary | ICD-10-CM | POA: Diagnosis not present

## 2022-07-23 DIAGNOSIS — F17210 Nicotine dependence, cigarettes, uncomplicated: Secondary | ICD-10-CM | POA: Diagnosis not present

## 2022-07-23 DIAGNOSIS — K219 Gastro-esophageal reflux disease without esophagitis: Secondary | ICD-10-CM | POA: Insufficient documentation

## 2022-07-23 DIAGNOSIS — J101 Influenza due to other identified influenza virus with other respiratory manifestations: Secondary | ICD-10-CM | POA: Diagnosis not present

## 2022-07-23 DIAGNOSIS — Z87440 Personal history of urinary (tract) infections: Secondary | ICD-10-CM | POA: Insufficient documentation

## 2022-07-23 DIAGNOSIS — Z800 Family history of malignant neoplasm of digestive organs: Secondary | ICD-10-CM | POA: Diagnosis not present

## 2022-07-23 DIAGNOSIS — G43909 Migraine, unspecified, not intractable, without status migrainosus: Secondary | ICD-10-CM | POA: Diagnosis not present

## 2022-07-23 DIAGNOSIS — R42 Dizziness and giddiness: Secondary | ICD-10-CM | POA: Diagnosis not present

## 2022-07-23 DIAGNOSIS — K760 Fatty (change of) liver, not elsewhere classified: Secondary | ICD-10-CM | POA: Insufficient documentation

## 2022-07-23 DIAGNOSIS — Z79624 Long term (current) use of inhibitors of nucleotide synthesis: Secondary | ICD-10-CM | POA: Insufficient documentation

## 2022-07-23 DIAGNOSIS — Z79899 Other long term (current) drug therapy: Secondary | ICD-10-CM | POA: Insufficient documentation

## 2022-07-23 DIAGNOSIS — R7989 Other specified abnormal findings of blood chemistry: Secondary | ICD-10-CM | POA: Diagnosis not present

## 2022-07-23 LAB — VITAMIN D 25 HYDROXY (VIT D DEFICIENCY, FRACTURES): Vit D, 25-Hydroxy: 18.4 ng/mL — ABNORMAL LOW (ref 30.0–100.0)

## 2022-07-23 MED ORDER — OMEPRAZOLE 40 MG PO CPDR: 40.0000 mg | DELAYED_RELEASE_CAPSULE | Freq: Every day | ORAL | 2 refills | Status: DC

## 2022-07-23 NOTE — Progress Notes (Signed)
NAME: [name]  DOB: 27-Jun-1975  MRN: [id number]  Date/Time: 07/23/2022 10:42 AM  Subjective:   ? [name] is a 47 y.o. female is here to see me for fever which has resolved since 2 weeks Pt was recently in [facility] 06/06/22-06/13/22 for fever and headache and underwent LP which was Normal. Was diagnosed with Influenza A infection which left her with severe headache. She also had abnormal lfts which was concerning for medication toxicity After discharge she had endosocpy on [id number] and it was helicobacter pylori for which she was treated by GI. Had liver biopsy on 2/1 which was fatty liver She also saw neurology on 07/17/22 and was diagnosed as migraine and started on Nortryptiline. After discharge from hospital she had fever and that time her PCP referred her to me- But hse has not had any fever since 2 weeks Has some aches in her hand joints-  Past Medical History:  Diagnosis Date   BRCA negative 07/2018   MyRisk neg   Family history of breast cancer    IBIS=13%/riskscore=8.4%   Family history of ovarian cancer    Family history of pancreatic cancer    GERD (gastroesophageal reflux disease)    History of frequent urinary tract infections    Hypoglycemia    IBS (irritable bowel syndrome)    Migraine headache    linger for days when they happen   Vertigo    none for several years    Past Surgical History:  Procedure Laterality Date   ABDOMINAL HYSTERECTOMY  07/2006   PARTIAL   BACK SURGERY     CHOLECYSTECTOMY     DIAGNOSTIC LAPAROSCOPY     test on bladder   ESOPHAGOGASTRODUODENOSCOPY (EGD) WITH PROPOFOL N/A 06/25/2022   Procedure: ESOPHAGOGASTRODUODENOSCOPY (EGD) WITH PROPOFOL;  Surgeon: Lin Landsman, MD;  Location: [facility];  Service: Endoscopy;  Laterality: N/A;   GALLBLADDER SURGERY     LAPAROSCOPIC APPENDECTOMY N/A 12/08/2016   Procedure: APPENDECTOMY LAPAROSCOPIC;  Surgeon: Jules Husbands, MD;  Location: [facility];  Service: General;  Laterality: N/A;    MAXIMUM ACCESS (MAS)POSTERIOR LUMBAR INTERBODY FUSION (PLIF) 1 LEVEL N/A 05/06/2013   Procedure: LUMBAR FIVE-SACRAL ONE MAXIMUM ACCESS POSTERIOR LUMBAR INTERBODY FUSION;  Surgeon: Eustace Moore, MD;  Location: [facility];  Service: Neurosurgery;  Laterality: N/A;   TOE SURGERY     TUBAL LIGATION     WISDOM TOOTH EXTRACTION      Social History   Socioeconomic History   Marital status: Legally Separated    Spouse name: Not on file   Number of children: Not on file   Years of education: Not on file   Highest education level: Not on file  Occupational History   Not on file  Tobacco Use   Smoking status: Every Day    Packs/day: 0.75    Years: 30.00    Total pack years: 22.50    Types: Cigarettes    Last attempt to quit: 05/30/2018    Years since quitting: 4.1   Smokeless tobacco: Never   Tobacco comments:    Started smoking around age 5  Vaping Use   Vaping Use: Some days  Substance and Sexual Activity   Alcohol use: Not Currently    Comment: was 40 beers/wk.  None since 06/03/22   Drug use: No   Sexual activity: Yes    Partners: Female, Female    Birth control/protection: Surgical    Comment: Hysterectomy  Other Topics Concern   Not on file  Social History Narrative   Not on file   Social Determinants of Health   Financial Resource Strain: High Risk (09/18/2021)   Overall Financial Resource Strain (CARDIA)    Difficulty of Paying Living Expenses: Hard  Food Insecurity: No Food Insecurity (06/07/2022)   Hunger Vital Sign    Worried About Running Out of Food in the Last Year: Never true    Ran Out of Food in the Last Year: Never true  Transportation Needs: No Transportation Needs (06/07/2022)   PRAPARE - Hydrologist (Medical): No    Lack of Transportation (Non-Medical): No  Physical Activity: Not on file  Stress: Not on file  Social Connections: Socially Isolated (09/18/2021)   Social Connection and Isolation Panel [NHANES]    Frequency of  Communication with Friends and Family: More than three times a week    Frequency of Social Gatherings with Friends and Family: Three times a week    Attends Religious Services: Never    Active Member of Clubs or Organizations: No    Attends Archivist Meetings: Never    Marital Status: Separated  Intimate Partner Violence: Not At Risk (06/07/2022)   Humiliation, Afraid, Rape, and Kick questionnaire    Fear of Current or Ex-Partner: No    Emotionally Abused: No    Physically Abused: No    Sexually Abused: No    Family History  Problem Relation Age of Onset   Hypertension Mother    COPD Mother    Epilepsy Mother    Sleep apnea Mother    Hypertension Father    Sleep apnea Father    Breast cancer Maternal Aunt 61   Ovarian cancer Maternal Aunt 45   Colon cancer Paternal Uncle 63   Pancreatic cancer Maternal Grandmother 56   Colon cancer Maternal Grandfather 63   Allergies  Allergen Reactions   Codeine Nausea And Vomiting   Sulfa Antibiotics Rash   Vicodin [Hydrocodone-Acetaminophen] Nausea Only    And itching all over   I? Current Outpatient Medications  Medication Sig Dispense Refill   Blood Glucose Monitoring Suppl DEVI 1 each by Does not apply route 2 (two) times daily as needed. May substitute to any manufacturer covered by patient's insurance. 1 each 0   co-enzyme Q-10 30 MG capsule Take 30 mg by mouth 3 (three) times daily.     FLUoxetine (PROZAC) 40 MG capsule Take 1 capsule (40 mg total) by mouth daily. 90 capsule 3   Glucose Blood (BLOOD GLUCOSE TEST STRIPS) STRP 1 each by In Vitro route 2 (two) times daily as needed. May substitute to any manufacturer covered by patient's insurance. 100 strip 0   hydrOXYzine (ATARAX) 25 MG tablet Take 2 tablets (50 mg total) by mouth every 8 (eight) hours as needed for itching. Take 1-2 tablets at bedtime 60 tablet 2   Lancet Device MISC 1 each by Does not apply route 2 (two) times daily as needed. May substitute to any  manufacturer covered by patient's insurance. 1 each 0   Lancets Misc. MISC 1 each by Does not apply route 2 (two) times daily as needed. May substitute to any manufacturer covered by patient's insurance. 100 each 0   Magnesium 400 MG CAPS Take 400 mg by mouth daily.     nortriptyline (PAMELOR) 10 MG capsule Take nortriptyline 10 mg at night for one week then increase to 20 mg at night and continue this dose.     omeprazole (PRILOSEC) 40 MG  capsule Take 1 capsule (40 mg total) by mouth daily. 30 capsule 2   ondansetron (ZOFRAN-ODT) 4 MG disintegrating tablet Take 1 tablet (4 mg total) by mouth every 8 (eight) hours as needed for Nausea for up to 7 days     rosuvastatin (CRESTOR) 10 MG tablet Take 1 tablet (10 mg total) by mouth daily. 90 tablet 3   traZODone (DESYREL) 100 MG tablet Take 1-2 tablets PO as needed to assist with sleep 180 tablet 3   valACYclovir (VALTREX) 500 MG tablet Take 2 tablets (1,000 mg total) by mouth 2 (two) times daily. 20 tablet 11   No current facility-administered medications for this visit.     Abtx:  Anti-infectives (From admission, onward)    None       REVIEW OF SYSTEMS:  Const: negative fever, negative chills, negative weight loss Eyes: negative diplopia or visual changes, negative eye pain ENT: negative coryza, negative sore throat Resp: negative cough, hemoptysis, dyspnea Cards: negative for chest pain, palpitations, lower extremity edema GU: negative for frequency, dysuria and hematuria GI: Negative for abdominal pain, diarrhea, bleeding, constipation Skin: negative for rash and pruritus Heme: negative for easy bruising and gum/nose bleeding MS: negative for myalgias, +arthralgias hands , + back pain  Neurolo:+headaches, no dizziness, vertigo, memory problems  Psych:  anxiety, depression  Endocrine: negative for thyroid, diabetes Allergy/Immunology- as above Objective:  VITALS:  BP 111/78   Pulse 73   Temp 98.7 F (37.1 C) (Temporal)   Ht 5'  6" (1.676 m)   Wt 190 lb (86.2 kg)   BMI 30.67 kg/m   PHYSICAL EXAM:  General: Alert, cooperative, no distress, appears stated age.  Head: Normocephalic, without obvious abnormality, atraumatic. Eyes: Conjunctivae clear, anicteric sclerae. Pupils are equal ENT Nares normal. No drainage or sinus tenderness. Lips, mucosa, and tongue normal. No Thrush Neck: Supple, symmetrical, no adenopathy, thyroid: non tender no carotid bruit and no JVD. Back: No CVA tenderness. Lungs: Clear to auscultation bilaterally. No Wheezing or Rhonchi. No rales. Heart: Regular rate and rhythm, no murmur, rub or gallop. Abdomen: Soft, non-tender,not distended. Bowel sounds normal. No masses Extremities: atraumatic, no cyanosis. No edema. No clubbing Skin: No rashes or lesions. Or bruising Lymph: Cervical, supraclavicular normal. Neurologic: Grossly non-focal Pertinent Labs Lab Results CBC    Component Value Date/Time   WBC 8.2 07/04/2022 0758   RBC 4.16 07/04/2022 0758   HGB 12.6 07/04/2022 0758   HGB 13.5 06/21/2022 1041   HCT 37.3 07/04/2022 0758   HCT 40.3 06/21/2022 1041   PLT 270 07/04/2022 0758   PLT 291 06/21/2022 1041   MCV 89.7 07/04/2022 0758   MCV 93 06/21/2022 1041   MCV 91 05/14/2014 2239   MCH 30.3 07/04/2022 0758   MCHC 33.8 07/04/2022 0758   RDW 13.3 07/04/2022 0758   RDW 11.9 06/21/2022 1041   RDW 12.7 05/14/2014 2239   LYMPHSABS 6.8 (H) 06/21/2022 1041   LYMPHSABS 1.4 10/23/2013 1022   MONOABS 0.5 06/06/2022 1334   MONOABS 0.7 10/23/2013 1022   EOSABS 0.3 06/21/2022 1041   EOSABS 0.1 10/23/2013 1022   BASOSABS 0.1 06/21/2022 1041   BASOSABS 0.1 10/23/2013 1022       Latest Ref Rng & Units 06/21/2022   10:41 AM 06/20/2022    2:38 PM 06/12/2022    4:01 AM  CMP  Glucose 70 - 99 mg/dL 104   88   BUN 6 - 24 mg/dL 7   5   Creatinine 0.57 - 1.00 mg/dL  0.87   0.59   Sodium 134 - 144 mmol/L 135   139   Potassium 3.5 - 5.2 mmol/L 4.8   4.1   Chloride 96 - 106 mmol/L 97   107    CO2 20 - 29 mmol/L 24   25   Calcium 8.7 - 10.2 mg/dL 9.2   8.0   Total Protein 6.0 - 8.5 g/dL  7.3  6.2   Total Bilirubin 0.0 - 1.2 mg/dL  0.3  0.3   Alkaline Phos 44 - 121 IU/L  304  279   AST 0 - 40 IU/L  66  108   ALT 0 - 32 IU/L  92  178    ASO titer borderline elevated- dont see a connection to her recent illness  IMAGING RESULTS: I have personally reviewed the films ? Impression/Recommendation ?Fever secondary to influenza A in jan has resolved completely Head ache- compounded by the Influenza infection with underlying migriane  Abnormal Lfts secondary to fatty liver  Recent helicobacter pylori infection- treated  Pt will follow up with GI and PCP No ID issues currently Follow PRN ? ? ___________________________________________________ Discussed with patient Note:  This document was prepared using Dragon voice recognition software and may include unintentional dictation errors.

## 2022-07-23 NOTE — Patient Instructions (Signed)
You are here for follow up of recent influenza infection and liver abnormality- you had liver biopsy- please follow up with Gi/PCP-

## 2022-07-24 ENCOUNTER — Telehealth: Payer: Self-pay

## 2022-07-24 DIAGNOSIS — A048 Other specified bacterial intestinal infections: Secondary | ICD-10-CM

## 2022-07-24 DIAGNOSIS — R7989 Other specified abnormal findings of blood chemistry: Secondary | ICD-10-CM

## 2022-07-24 LAB — H. PYLORI BREATH TEST: H pylori Breath Test: POSITIVE — AB

## 2022-07-24 MED ORDER — CLARITHROMYCIN 500 MG PO TABS: 500.0000 mg | ORAL_TABLET | Freq: Two times a day (BID) | ORAL | 0 refills | Status: AC

## 2022-07-24 MED ORDER — OMEPRAZOLE 20 MG PO CPDR: 20.0000 mg | DELAYED_RELEASE_CAPSULE | Freq: Two times a day (BID) | ORAL | 0 refills | Status: DC

## 2022-07-24 MED ORDER — BISMUTH SUBSALICYLATE 262 MG PO CHEW: 524.0000 mg | CHEWABLE_TABLET | Freq: Three times a day (TID) | ORAL | 0 refills | Status: AC

## 2022-07-24 MED ORDER — DOXYCYCLINE HYCLATE 100 MG PO CAPS: 100.0000 mg | ORAL_CAPSULE | Freq: Two times a day (BID) | ORAL | 0 refills | Status: AC

## 2022-07-24 MED ORDER — VITAMIN D3 50 MCG (2000 UT) PO CAPS: 1.0000 | ORAL_CAPSULE | Freq: Every day | ORAL | 4 refills | Status: DC

## 2022-07-24 MED ORDER — VITAMIN D (ERGOCALCIFEROL) 1.25 MG (50000 UNIT) PO CAPS: 50000.0000 [IU] | ORAL_CAPSULE | ORAL | 0 refills | Status: DC

## 2022-07-24 NOTE — Telephone Encounter (Signed)
-----   Message from Lin Landsman, MD sent at 07/24/2022  1:04 PM EST ----- H. pylori breath test came back positive.  Even though she finished first course of antibiotics for H. pylori infection, there is a risk for recurrent infection or antibiotic resistant Helicobacter pylori bacteria.  Therefore, I recommend 4 drug regimen for 14 days  Omeprazole 20 mg p.o. twice daily Clarithromycin 500 twice daily Doxycycline 100 mg twice daily Bismuth 300 or 524 mg 4 times daily  Recommend H. pylori breath test 2 months after completion of treatment, off PPI  [name]

## 2022-07-24 NOTE — Telephone Encounter (Signed)
Patient verbalized understanding of results. Sent medication to the pharmacy. Put in a reminder for 2 months for patient to have repeat H pylori breath test and to call patient

## 2022-07-24 NOTE — Telephone Encounter (Signed)
-----   Message from Lin Landsman, MD sent at 07/23/2022  4:45 PM EST ----- [name]  Please inform patient that she has severe vitamin D deficiency.  Recommend to take vitamin D 50K units weekly for 12 weeks followed by over-the-counter vitamin D supplements daily.  Recheck vitamin D levels in 3 months  RV

## 2022-07-24 NOTE — Telephone Encounter (Signed)
When calling to go over patient vitamin D results. Patient states she saw this morning on mychart that her H pylori came back positive. She states she did finish all of the antibiotics when she took treatment for this 6 weeks ago

## 2022-07-24 NOTE — Telephone Encounter (Signed)
Patient verbalized understanding of results. Sent vitamin d to the pharmacy. Put a recall to remind patient to have labs in 3 months

## 2022-08-12 ENCOUNTER — Telehealth: Payer: Self-pay

## 2022-08-12 DIAGNOSIS — R197 Diarrhea, unspecified: Secondary | ICD-10-CM

## 2022-08-12 NOTE — Telephone Encounter (Signed)
She just finished antibiotics, it is too early to test for H. pylori.  Recommend GI profile PCR to rule out any other infection  RV

## 2022-08-12 NOTE — Telephone Encounter (Signed)
Patient left a voicemail. Patient states she is still dealing with diarrhea, sever nausea, and vomiting. Has threw up twice today and dry heaving all day. Diarrhea 7 times today. She states she Is bloated and has no appetite. Patient states she finished the antibiotics a couple days ago for H pylori for the second time. She states her boyfriend tested positive for it Friday and she is pick up his medications tonight. She is wanting to know if she needs to get treated again for it or what you recommend

## 2022-08-13 NOTE — Addendum Note (Signed)
Addended by: Ivon Roedel L on: 08/13/2022 08:00 AM   Modules accepted: Orders

## 2022-08-13 NOTE — Telephone Encounter (Signed)
Patient verbalized understanding of instructions. She states she will come by today and pick up stool kit

## 2022-08-13 NOTE — Telephone Encounter (Signed)
Order GI profile

## 2022-08-16 LAB — GI PROFILE, STOOL, PCR

## 2022-09-03 ENCOUNTER — Telehealth: Payer: 59 | Admitting: Family Medicine

## 2022-09-03 DIAGNOSIS — T3695XA Adverse effect of unspecified systemic antibiotic, initial encounter: Secondary | ICD-10-CM

## 2022-09-03 DIAGNOSIS — B379 Candidiasis, unspecified: Secondary | ICD-10-CM | POA: Diagnosis not present

## 2022-09-03 MED ORDER — FLUCONAZOLE 150 MG PO TABS: 150.0000 mg | ORAL_TABLET | ORAL | 0 refills | Status: DC

## 2022-09-03 NOTE — Progress Notes (Signed)
Virtual Visit Consent   [name], you are scheduled for a virtual visit with a [facility] provider today. Just as with appointments in the office, your consent must be obtained to participate. Your consent will be active for this visit and any virtual visit you may have with one of our providers in the next 365 days. If you have a MyChart account, a copy of this consent can be sent to you electronically.  As this is a virtual visit, video technology does not allow for your provider to perform a traditional examination. This may limit your provider's ability to fully assess your condition. If your provider identifies any concerns that need to be evaluated in person or the need to arrange testing (such as labs, EKG, etc.), we will make arrangements to do so. Although advances in technology are sophisticated, we cannot ensure that it will always work on either your end or our end. If the connection with a video visit is poor, the visit may have to be switched to a telephone visit. With either a video or telephone visit, we are not always able to ensure that we have a secure connection.  By engaging in this virtual visit, you consent to the provision of healthcare and authorize for your insurance to be billed (if applicable) for the services provided during this visit. Depending on your insurance coverage, you may receive a charge related to this service.  I need to obtain your verbal consent now. Are you willing to proceed with your visit today? Jadan Sroka has provided verbal consent on 09/03/2022 for a virtual visit (video or telephone). Perlie Mayo, NP  Date: 09/03/2022 11:19 AM  Virtual Visit via Video Note   I, Perlie Mayo, connected with  [name]  ([id number], 1976/02/27) on 09/03/22 at 11:15 AM EDT by a video-enabled telemedicine application and verified that I am speaking with the correct person using two identifiers.  Location: Patient: Virtual Visit Location Patient:  Home Provider: Virtual Visit Location Provider: Home Office   I discussed the limitations of evaluation and management by telemedicine and the availability of in person appointments. The patient expressed understanding and agreed to proceed.    History of Present Illness: [name] is a 47 y.o. who identifies as a female who was assigned female at birth, and is being seen today for yeast infection post antibiotics  Onset was yesterday with itching Associated symptoms are burning, tenderness, discharge white and thick Modifying factors are none Denies chest pain, shortness of breath, fevers, chills, UTI symptoms Did just finish treatment for H Pylori with 3 antibiotics  Problems:  Patient Active Problem List   Diagnosis Date Noted   Dyspepsia 06/25/2022   Early satiety 06/21/2022   Daily headache 06/21/2022   Hospital discharge follow-up 06/21/2022   CMV (cytomegalovirus infection) status positive 06/13/2022   Fever 06/04/2022   Body aches 06/04/2022   BRCA negative 08/10/2018   Hypoglycemia 11/15/2008    Allergies:  Allergies  Allergen Reactions   Codeine Nausea And Vomiting   Sulfa Antibiotics Rash   Vicodin [Hydrocodone-Acetaminophen] Nausea Only    And itching all over   Medications:  Current Outpatient Medications:    Blood Glucose Monitoring Suppl DEVI, 1 each by Does not apply route 2 (two) times daily as needed. May substitute to any manufacturer covered by patient's insurance., Disp: 1 each, Rfl: 0   Cholecalciferol (VITAMIN D3) 50 MCG (2000 UT) CAPS, Take 1 capsule (2,000 Units total) by mouth daily., Disp: 30  capsule, Rfl: 4   co-enzyme Q-10 30 MG capsule, Take 30 mg by mouth 3 (three) times daily., Disp: , Rfl:    FLUoxetine (PROZAC) 40 MG capsule, Take 1 capsule (40 mg total) by mouth daily., Disp: 90 capsule, Rfl: 3   Magnesium 400 MG CAPS, Take 400 mg by mouth daily., Disp: , Rfl:    nortriptyline (PAMELOR) 10 MG capsule, Take nortriptyline 10 mg at night  for one week then increase to 20 mg at night and continue this dose., Disp: , Rfl:    omeprazole (PRILOSEC) 20 MG capsule, Take 1 capsule (20 mg total) by mouth 2 (two) times daily before a meal for 14 days., Disp: 28 capsule, Rfl: 0   omeprazole (PRILOSEC) 40 MG capsule, Take 1 capsule (40 mg total) by mouth daily., Disp: 30 capsule, Rfl: 2   rosuvastatin (CRESTOR) 10 MG tablet, Take 1 tablet (10 mg total) by mouth daily., Disp: 90 tablet, Rfl: 3   traZODone (DESYREL) 100 MG tablet, Take 1-2 tablets PO as needed to assist with sleep, Disp: 180 tablet, Rfl: 3   valACYclovir (VALTREX) 500 MG tablet, Take 2 tablets (1,000 mg total) by mouth 2 (two) times daily., Disp: 20 tablet, Rfl: 11   Vitamin D, Ergocalciferol, (DRISDOL) 1.25 MG (50000 UNIT) CAPS capsule, Take 1 capsule (50,000 Units total) by mouth every 7 (seven) days., Disp: 12 capsule, Rfl: 0  Observations/Objective: Patient is well-developed, well-nourished in no acute distress.  Resting comfortably  at home.  Head is normocephalic, atraumatic.  No labored breathing.  Speech is clear and coherent with logical content.  Patient is alert and oriented at baseline.    Assessment and Plan:  1. Antibiotic-induced yeast infection  - fluconazole (DIFLUCAN) 150 MG tablet; Take 1 tablet (150 mg total) by mouth as directed. May repeat in 3 days  Dispense: 2 tablet; Refill: 0  -treatment as above, prevention discussed Follow up if not improved  Reviewed side effects, risks and benefits of medication.    Patient acknowledged agreement and understanding of the plan.   Past Medical, Surgical, Social History, Allergies, and Medications have been Reviewed.    Follow Up Instructions: I discussed the assessment and treatment plan with the patient. The patient was provided an opportunity to ask questions and all were answered. The patient agreed with the plan and demonstrated an understanding of the instructions.  A copy of instructions were  sent to the patient via MyChart unless otherwise noted below.     The patient was advised to call back or seek an in-person evaluation if the symptoms worsen or if the condition fails to improve as anticipated.  Time:  I spent 6 minutes with the patient via telehealth technology discussing the above problems/concerns.    Perlie Mayo, NP

## 2022-09-03 NOTE — Patient Instructions (Signed)
[name], thank you for joining Perlie Mayo, NP for today's virtual visit.  While this provider is not your primary care provider (PCP), if your PCP is located in our provider database this encounter information will be shared with them immediately following your visit.   [name] account gives you access to today's visit and all your visits, tests, and labs performed at [facility] " click here if you don't have a [name] account or go to [email or web address]  Consent: (Patient) [name] provided verbal consent for this virtual visit at the beginning of the encounter.  Current Medications:  Current Outpatient Medications:    fluconazole (DIFLUCAN) 150 MG tablet, Take 1 tablet (150 mg total) by mouth as directed. May repeat in 3 days, Disp: 2 tablet, Rfl: 0   Blood Glucose Monitoring Suppl DEVI, 1 each by Does not apply route 2 (two) times daily as needed. May substitute to any manufacturer covered by patient's insurance., Disp: 1 each, Rfl: 0   Cholecalciferol (VITAMIN D3) 50 MCG (2000 UT) CAPS, Take 1 capsule (2,000 Units total) by mouth daily., Disp: 30 capsule, Rfl: 4   co-enzyme Q-10 30 MG capsule, Take 30 mg by mouth 3 (three) times daily., Disp: , Rfl:    FLUoxetine (PROZAC) 40 MG capsule, Take 1 capsule (40 mg total) by mouth daily., Disp: 90 capsule, Rfl: 3   Magnesium 400 MG CAPS, Take 400 mg by mouth daily., Disp: , Rfl:    nortriptyline (PAMELOR) 10 MG capsule, Take nortriptyline 10 mg at night for one week then increase to 20 mg at night and continue this dose., Disp: , Rfl:    omeprazole (PRILOSEC) 20 MG capsule, Take 1 capsule (20 mg total) by mouth 2 (two) times daily before a meal for 14 days., Disp: 28 capsule, Rfl: 0   omeprazole (PRILOSEC) 40 MG capsule, Take 1 capsule (40 mg total) by mouth daily., Disp: 30 capsule, Rfl: 2   rosuvastatin (CRESTOR) 10 MG tablet, Take 1 tablet (10 mg total) by mouth daily., Disp: 90  tablet, Rfl: 3   traZODone (DESYREL) 100 MG tablet, Take 1-2 tablets PO as needed to assist with sleep, Disp: 180 tablet, Rfl: 3   valACYclovir (VALTREX) 500 MG tablet, Take 2 tablets (1,000 mg total) by mouth 2 (two) times daily., Disp: 20 tablet, Rfl: 11   Vitamin D, Ergocalciferol, (DRISDOL) 1.25 MG (50000 UNIT) CAPS capsule, Take 1 capsule (50,000 Units total) by mouth every 7 (seven) days., Disp: 12 capsule, Rfl: 0   Medications ordered in this encounter:  Meds ordered this encounter  Medications   fluconazole (DIFLUCAN) 150 MG tablet    Sig: Take 1 tablet (150 mg total) by mouth as directed. May repeat in 3 days    Dispense:  2 tablet    Refill:  0    Order Specific Question:   Supervising Provider    Answer:   Chase Picket [id number]     *If you need refills on other medications prior to your next appointment, please contact your pharmacy*  Follow-Up: Call back or seek an in-person evaluation if the symptoms worsen or if the condition fails to improve as anticipated.  [name] [phone]  Other Instructions  Vaginal Yeast Infection, Adult  Vaginal yeast infection is a condition that causes vaginal discharge as well as soreness, swelling, and redness (inflammation) of the vagina. This is a common condition. Some women get this infection frequently. What are the causes?  This condition is caused by a change in the normal balance of the yeast (Candida) and normal bacteria that live in the vagina. This change causes an overgrowth of yeast, which causes the inflammation. What increases the risk? The condition is more likely to develop in women who: Take antibiotic medicines. Have diabetes. Take birth control pills. Are pregnant. Douche often. Have a weak body defense system (immune system). Have been taking steroid medicines for a long time. Frequently wear tight clothing. What are the signs or symptoms? Symptoms of this condition include: White,  thick, creamy vaginal discharge. Swelling, itching, redness, and irritation of the vagina. The lips of the vagina (labia) may be affected as well. Pain or a burning feeling while urinating. Pain during sex. How is this diagnosed? This condition is diagnosed based on: Your medical history. A physical exam. A pelvic exam. Your health care provider will examine a sample of your vaginal discharge under a microscope. Your health care provider may send this sample for testing to confirm the diagnosis. How is this treated? This condition is treated with medicine. Medicines may be over-the-counter or prescription. You may be told to use one or more of the following: Medicine that is taken by mouth (orally). Medicine that is applied as a cream (topically). Medicine that is inserted directly into the vagina (suppository). Follow these instructions at home: Take or apply over-the-counter and prescription medicines only as told by your health care provider. Do not use tampons until your health care provider approves. Do not have sex until your infection has cleared. Sex can prolong or worsen your symptoms of infection. Ask your health care provider when it is safe to resume sexual activity. Keep all follow-up visits. This is important. How is this prevented?  Do not wear tight clothes, such as pantyhose or tight pants. Wear breathable cotton underwear. Do not use douches, perfumed soap, creams, or powders. Wipe from front to back after using the toilet. If you have diabetes, keep your blood sugar levels under control. Ask your health care provider for other ways to prevent yeast infections. Contact a health care provider if: You have a fever. Your symptoms go away and then return. Your symptoms do not get better with treatment. Your symptoms get worse. You have new symptoms. You develop blisters in or around your vagina. You have blood coming from your vagina and it is not your menstrual  period. You develop pain in your abdomen. Summary Vaginal yeast infection is a condition that causes discharge as well as soreness, swelling, and redness (inflammation) of the vagina. This condition is treated with medicine. Medicines may be over-the-counter or prescription. Take or apply over-the-counter and prescription medicines only as told by your health care provider. Do not douche. Resume sexual activity or use of tampons as instructed by your health care provider. Contact a health care provider if your symptoms do not get better with treatment or your symptoms go away and then return. This information is not intended to replace advice given to you by your health care provider. Make sure you discuss any questions you have with your health care provider. Document Revised: 08/07/2020 Document Reviewed: 08/07/2020 Elsevier Patient Education  [place].    If you have been instructed to have an in-person evaluation today at a local Urgent Care facility, please use the link below. It will take you to a list of all of our available [facility], including address, phone number and hours of operation. Please do not  delay care.  [facility]  If you or a family member do not have a primary care provider, use the link below to schedule a visit and establish care. When you choose a [facility] primary care physician or advanced practice provider, you gain a long-term partner in health. Find a Primary Care Provider  Learn more about [facility]'s in-office and virtual care options: [facility]

## 2022-09-04 ENCOUNTER — Telehealth: Payer: 59 | Admitting: Physician Assistant

## 2022-09-04 DIAGNOSIS — T3695XA Adverse effect of unspecified systemic antibiotic, initial encounter: Secondary | ICD-10-CM | POA: Diagnosis not present

## 2022-09-04 DIAGNOSIS — R3989 Other symptoms and signs involving the genitourinary system: Secondary | ICD-10-CM

## 2022-09-04 DIAGNOSIS — B379 Candidiasis, unspecified: Secondary | ICD-10-CM | POA: Diagnosis not present

## 2022-09-04 MED ORDER — FLUCONAZOLE 150 MG PO TABS: 150.0000 mg | ORAL_TABLET | ORAL | 0 refills | Status: DC | PRN

## 2022-09-04 MED ORDER — PHENAZOPYRIDINE HCL 100 MG PO TABS: 100.0000 mg | ORAL_TABLET | Freq: Three times a day (TID) | ORAL | 0 refills | Status: DC | PRN

## 2022-09-04 MED ORDER — CEPHALEXIN 500 MG PO CAPS: 500.0000 mg | ORAL_CAPSULE | Freq: Two times a day (BID) | ORAL | 0 refills | Status: DC

## 2022-09-04 NOTE — Progress Notes (Signed)
Virtual Visit Consent   [name], you are scheduled for a virtual visit with a [facility] provider today. Just as with appointments in the office, your consent must be obtained to participate. Your consent will be active for this visit and any virtual visit you may have with one of our providers in the next 365 days. If you have a MyChart account, a copy of this consent can be sent to you electronically.  As this is a virtual visit, video technology does not allow for your provider to perform a traditional examination. This may limit your provider's ability to fully assess your condition. If your provider identifies any concerns that need to be evaluated in person or the need to arrange testing (such as labs, EKG, etc.), we will make arrangements to do so. Although advances in technology are sophisticated, we cannot ensure that it will always work on either your end or our end. If the connection with a video visit is poor, the visit may have to be switched to a telephone visit. With either a video or telephone visit, we are not always able to ensure that we have a secure connection.  By engaging in this virtual visit, you consent to the provision of healthcare and authorize for your insurance to be billed (if applicable) for the services provided during this visit. Depending on your insurance coverage, you may receive a charge related to this service.  I need to obtain your verbal consent now. Are you willing to proceed with your visit today? [name] has provided verbal consent on 09/04/2022 for a virtual visit (video or telephone). Doris Daring, PA-C  Date: 09/04/2022 9:35 AM  Virtual Visit via Video Note   I, [name], connected with  [name]  ([id number], December 17, 1975) on 09/04/22 at  9:30 AM EDT by a video-enabled telemedicine application and verified that I am speaking with the correct person using two identifiers.  Location: Patient: Virtual Visit Location  Patient: Home Provider: Virtual Visit Location Provider: Home Office   I discussed the limitations of evaluation and management by telemedicine and the availability of in person appointments. The patient expressed understanding and agreed to proceed.    History of Present Illness: [name] is a 47 y.o. who identifies as a female who was assigned female at birth, and is being seen today for possible UTI.  HPI: Urinary Tract Infection  This is a new problem. The current episode started today. The problem occurs every urination. The problem has been gradually worsening. The quality of the pain is described as aching and burning. The pain is moderate. There has been no fever. Associated symptoms include a discharge, frequency, hesitancy and urgency. Pertinent negatives include no chills, hematuria, nausea or vomiting. Associated symptoms comments: Bladder spasms, cloudy, foul smelling. She has tried nothing for the symptoms. The treatment provided no relief.    Did recently complete antibiotic treatment for H.Pylori and was given diflucan yesterday for an antibiotic induced yeast infection. UTI symptoms started overnight.   Problems:  Patient Active Problem List   Diagnosis Date Noted   Dyspepsia 06/25/2022   Early satiety 06/21/2022   Daily headache 06/21/2022   Hospital discharge follow-up 06/21/2022   CMV (cytomegalovirus infection) status positive 06/13/2022   Fever 06/04/2022   Body aches 06/04/2022   BRCA negative 08/10/2018   Hypoglycemia 11/15/2008    Allergies:  Allergies  Allergen Reactions   Codeine Nausea And Vomiting   Sulfa Antibiotics Rash   Vicodin [Hydrocodone-Acetaminophen] Nausea  Only    And itching all over   Medications:  Current Outpatient Medications:    cephALEXin (KEFLEX) 500 MG capsule, Take 1 capsule (500 mg total) by mouth 2 (two) times daily., Disp: 14 capsule, Rfl: 0   fluconazole (DIFLUCAN) 150 MG tablet, Take 1 tablet (150 mg total) by mouth  every 3 (three) days as needed., Disp: 2 tablet, Rfl: 0   phenazopyridine (PYRIDIUM) 100 MG tablet, Take 1 tablet (100 mg total) by mouth 3 (three) times daily as needed for pain., Disp: 10 tablet, Rfl: 0   Blood Glucose Monitoring Suppl DEVI, 1 each by Does not apply route 2 (two) times daily as needed. May substitute to any manufacturer covered by patient's insurance., Disp: 1 each, Rfl: 0   Cholecalciferol (VITAMIN D3) 50 MCG (2000 UT) CAPS, Take 1 capsule (2,000 Units total) by mouth daily., Disp: 30 capsule, Rfl: 4   co-enzyme Q-10 30 MG capsule, Take 30 mg by mouth 3 (three) times daily., Disp: , Rfl:    FLUoxetine (PROZAC) 40 MG capsule, Take 1 capsule (40 mg total) by mouth daily., Disp: 90 capsule, Rfl: 3   Magnesium 400 MG CAPS, Take 400 mg by mouth daily., Disp: , Rfl:    nortriptyline (PAMELOR) 10 MG capsule, Take nortriptyline 10 mg at night for one week then increase to 20 mg at night and continue this dose., Disp: , Rfl:    omeprazole (PRILOSEC) 20 MG capsule, Take 1 capsule (20 mg total) by mouth 2 (two) times daily before a meal for 14 days., Disp: 28 capsule, Rfl: 0   omeprazole (PRILOSEC) 40 MG capsule, Take 1 capsule (40 mg total) by mouth daily., Disp: 30 capsule, Rfl: 2   rosuvastatin (CRESTOR) 10 MG tablet, Take 1 tablet (10 mg total) by mouth daily., Disp: 90 tablet, Rfl: 3   traZODone (DESYREL) 100 MG tablet, Take 1-2 tablets PO as needed to assist with sleep, Disp: 180 tablet, Rfl: 3   valACYclovir (VALTREX) 500 MG tablet, Take 2 tablets (1,000 mg total) by mouth 2 (two) times daily., Disp: 20 tablet, Rfl: 11   Vitamin D, Ergocalciferol, (DRISDOL) 1.25 MG (50000 UNIT) CAPS capsule, Take 1 capsule (50,000 Units total) by mouth every 7 (seven) days., Disp: 12 capsule, Rfl: 0  Observations/Objective: Patient is well-developed, well-nourished in no acute distress.  Resting comfortably at home.  Head is normocephalic, atraumatic.  No labored breathing.  Speech is clear and  coherent with logical content.  Patient is alert and oriented at baseline.    Assessment and Plan: 1. Suspected UTI - cephALEXin (KEFLEX) 500 MG capsule; Take 1 capsule (500 mg total) by mouth 2 (two) times daily.  Dispense: 14 capsule; Refill: 0 - phenazopyridine (PYRIDIUM) 100 MG tablet; Take 1 tablet (100 mg total) by mouth 3 (three) times daily as needed for pain.  Dispense: 10 tablet; Refill: 0  2. Antibiotic-induced yeast infection - fluconazole (DIFLUCAN) 150 MG tablet; Take 1 tablet (150 mg total) by mouth every 3 (three) days as needed.  Dispense: 2 tablet; Refill: 0  - Worsening symptoms.  - Will treat empirically with Keflex - Diflucan given as prophylaxis as patient tends to get vaginal yeast infections with antibiotic use - May use AZO for bladder spasms - Continue to push fluids.  - Seek in person evaluation for urine culture if symptoms do not improve or if they worsen.    Follow Up Instructions: I discussed the assessment and treatment plan with the patient. The patient was provided an  opportunity to ask questions and all were answered. The patient agreed with the plan and demonstrated an understanding of the instructions.  A copy of instructions were sent to the patient via MyChart unless otherwise noted below.    The patient was advised to call back or seek an in-person evaluation if the symptoms worsen or if the condition fails to improve as anticipated.  Time:  I spent 8 minutes with the patient via telehealth technology discussing the above problems/concerns.    Doris Daring, PA-C

## 2022-09-04 NOTE — Patient Instructions (Signed)
[name], thank you for joining Mar Daring, PA-C for today's virtual visit.  While this provider is not your primary care provider (PCP), if your PCP is located in our provider database this encounter information will be shared with them immediately following your visit.   [facility] account gives you access to today's visit and all your visits, tests, and labs performed at [facility] " click here if you don't have a [facility] account or go to [email or web address]  Consent: (Patient) [name] provided verbal consent for this virtual visit at the beginning of the encounter.  Current Medications:  Current Outpatient Medications:    cephALEXin (KEFLEX) 500 MG capsule, Take 1 capsule (500 mg total) by mouth 2 (two) times daily., Disp: 14 capsule, Rfl: 0   fluconazole (DIFLUCAN) 150 MG tablet, Take 1 tablet (150 mg total) by mouth every 3 (three) days as needed., Disp: 2 tablet, Rfl: 0   phenazopyridine (PYRIDIUM) 100 MG tablet, Take 1 tablet (100 mg total) by mouth 3 (three) times daily as needed for pain., Disp: 10 tablet, Rfl: 0   Blood Glucose Monitoring Suppl DEVI, 1 each by Does not apply route 2 (two) times daily as needed. May substitute to any manufacturer covered by patient's insurance., Disp: 1 each, Rfl: 0   Cholecalciferol (VITAMIN D3) 50 MCG (2000 UT) CAPS, Take 1 capsule (2,000 Units total) by mouth daily., Disp: 30 capsule, Rfl: 4   co-enzyme Q-10 30 MG capsule, Take 30 mg by mouth 3 (three) times daily., Disp: , Rfl:    FLUoxetine (PROZAC) 40 MG capsule, Take 1 capsule (40 mg total) by mouth daily., Disp: 90 capsule, Rfl: 3   Magnesium 400 MG CAPS, Take 400 mg by mouth daily., Disp: , Rfl:    nortriptyline (PAMELOR) 10 MG capsule, Take nortriptyline 10 mg at night for one week then increase to 20 mg at night and continue this dose., Disp: , Rfl:    omeprazole (PRILOSEC) 20 MG capsule, Take 1 capsule (20 mg total) by mouth 2  (two) times daily before a meal for 14 days., Disp: 28 capsule, Rfl: 0   omeprazole (PRILOSEC) 40 MG capsule, Take 1 capsule (40 mg total) by mouth daily., Disp: 30 capsule, Rfl: 2   rosuvastatin (CRESTOR) 10 MG tablet, Take 1 tablet (10 mg total) by mouth daily., Disp: 90 tablet, Rfl: 3   traZODone (DESYREL) 100 MG tablet, Take 1-2 tablets PO as needed to assist with sleep, Disp: 180 tablet, Rfl: 3   valACYclovir (VALTREX) 500 MG tablet, Take 2 tablets (1,000 mg total) by mouth 2 (two) times daily., Disp: 20 tablet, Rfl: 11   Vitamin D, Ergocalciferol, (DRISDOL) 1.25 MG (50000 UNIT) CAPS capsule, Take 1 capsule (50,000 Units total) by mouth every 7 (seven) days., Disp: 12 capsule, Rfl: 0   Medications ordered in this encounter:  Meds ordered this encounter  Medications   cephALEXin (KEFLEX) 500 MG capsule    Sig: Take 1 capsule (500 mg total) by mouth 2 (two) times daily.    Dispense:  14 capsule    Refill:  0    Order Specific Question:   Supervising Provider    Answer:   Chase Picket [id number]   phenazopyridine (PYRIDIUM) 100 MG tablet    Sig: Take 1 tablet (100 mg total) by mouth 3 (three) times daily as needed for pain.    Dispense:  10 tablet    Refill:  0    Order Specific Question:  Supervising Provider    Answer:   Chase Picket [id number]   fluconazole (DIFLUCAN) 150 MG tablet    Sig: Take 1 tablet (150 mg total) by mouth every 3 (three) days as needed.    Dispense:  2 tablet    Refill:  0    Order Specific Question:   Supervising Provider    Answer:   Chase Picket [id number]     *If you need refills on other medications prior to your next appointment, please contact your pharmacy*  Follow-Up: Call back or seek an in-person evaluation if the symptoms worsen or if the condition fails to improve as anticipated.  [place] [phone]  Other Instructions  Urinary Tract Infection, Adult  A urinary tract infection (UTI) is an infection of  any part of the urinary tract. The urinary tract includes the kidneys, ureters, bladder, and urethra. These organs make, store, and get rid of urine in the body. An upper UTI affects the ureters and kidneys. A lower UTI affects the bladder and urethra. What are the causes? Most urinary tract infections are caused by bacteria in your genital area around your urethra, where urine leaves your body. These bacteria grow and cause inflammation of your urinary tract. What increases the risk? You are more likely to develop this condition if: You have a urinary catheter that stays in place. You are not able to control when you urinate or have a bowel movement (incontinence). You are female and you: Use a spermicide or diaphragm for birth control. Have low estrogen levels. Are pregnant. You have certain genes that increase your risk. You are sexually active. You take antibiotic medicines. You have a condition that causes your flow of urine to slow down, such as: An enlarged prostate, if you are female. Blockage in your urethra. A kidney stone. A nerve condition that affects your bladder control (neurogenic bladder). Not getting enough to drink, or not urinating often. You have certain medical conditions, such as: Diabetes. A weak disease-fighting system (immunesystem). Sickle cell disease. Gout. Spinal cord injury. What are the signs or symptoms? Symptoms of this condition include: Needing to urinate right away (urgency). Frequent urination. This may include small amounts of urine each time you urinate. Pain or burning with urination. Blood in the urine. Urine that smells bad or unusual. Trouble urinating. Cloudy urine. Vaginal discharge, if you are female. Pain in the abdomen or the lower back. You may also have: Vomiting or a decreased appetite. Confusion. Irritability or tiredness. A fever or chills. Diarrhea. The first symptom in older adults may be confusion. In some cases, they  may not have any symptoms until the infection has worsened. How is this diagnosed? This condition is diagnosed based on your medical history and a physical exam. You may also have other tests, including: Urine tests. Blood tests. Tests for STIs (sexually transmitted infections). If you have had more than one UTI, a cystoscopy or imaging studies may be done to determine the cause of the infections. How is this treated? Treatment for this condition includes: Antibiotic medicine. Over-the-counter medicines to treat discomfort. Drinking enough water to stay hydrated. If you have frequent infections or have other conditions such as a kidney stone, you may need to see a health care provider who specializes in the urinary tract (urologist). In rare cases, urinary tract infections can cause sepsis. Sepsis is a life-threatening condition that occurs when the body responds to an infection. Sepsis is treated in the hospital with  IV antibiotics, fluids, and other medicines. Follow these instructions at home:  Medicines Take over-the-counter and prescription medicines only as told by your health care provider. If you were prescribed an antibiotic medicine, take it as told by your health care provider. Do not stop using the antibiotic even if you start to feel better. General instructions Make sure you: Empty your bladder often and completely. Do not hold urine for long periods of time. Empty your bladder after sex. Wipe from front to back after urinating or having a bowel movement if you are female. Use each tissue only one time when you wipe. Drink enough fluid to keep your urine pale yellow. Keep all follow-up visits. This is important. Contact a health care provider if: Your symptoms do not get better after 1-2 days. Your symptoms go away and then return. Get help right away if: You have severe pain in your back or your lower abdomen. You have a fever or chills. You have nausea or  vomiting. Summary A urinary tract infection (UTI) is an infection of any part of the urinary tract, which includes the kidneys, ureters, bladder, and urethra. Most urinary tract infections are caused by bacteria in your genital area. Treatment for this condition often includes antibiotic medicines. If you were prescribed an antibiotic medicine, take it as told by your health care provider. Do not stop using the antibiotic even if you start to feel better. Keep all follow-up visits. This is important. This information is not intended to replace advice given to you by your health care provider. Make sure you discuss any questions you have with your health care provider. Document Revised: 12/31/2019 Document Reviewed: 12/31/2019 Elsevier Patient Education  [place].    If you have been instructed to have an in-person evaluation today at a local Urgent Care facility, please use the link below. It will take you to a list of all of our available [facility], including address, phone number and hours of operation. Please do not delay care.  [facility]  If you or a family member do not have a primary care provider, use the link below to schedule a visit and establish care. When you choose a [facility] primary care physician or advanced practice provider, you gain a long-term partner in health. Find a Primary Care Provider  Learn more about 's in-office and virtual care options: [facility]

## 2022-09-09 NOTE — Progress Notes (Unsigned)
I,J'ya E Sariyah Corcino,acting as a scribe for [name], FNP.,have documented all relevant documentation on the behalf of [name], FNP,as directed by  [name], FNP while in the presence of [name], FNP.  Complete physical exam  Patient: [name]   DOB: [birth year]   47 y.o. Female  MRN: [id number] Visit Date: 09/10/2022  Today's healthcare provider: [name], FNP  Re Introduced to nurse practitioner role and practice setting.  All questions answered.  Discussed provider/patient relationship and expectations.  Chief Complaint  Patient presents with   Annual Exam   Subjective    [name] is a 47 y.o. female who presents today for a complete physical exam.  She reports consuming a general and low fat diet. The patient does not participate in regular exercise at present. She generally feels fairly well. She reports sleeping well. She does not have additional problems to discuss today.   HPI   Past Medical History:  Diagnosis Date   BRCA negative 07/2018   MyRisk neg   Family history of breast cancer    IBIS=13%/riskscore=8.4%   Family history of ovarian cancer    Family history of pancreatic cancer    GERD (gastroesophageal reflux disease)    History of frequent urinary tract infections    Hypoglycemia    IBS (irritable bowel syndrome)    Migraine headache    linger for days when they happen   Vertigo    none for several years   Past Surgical History:  Procedure Laterality Date   ABDOMINAL HYSTERECTOMY  07/2006   PARTIAL   BACK SURGERY     CHOLECYSTECTOMY     DIAGNOSTIC LAPAROSCOPY     test on bladder   ESOPHAGOGASTRODUODENOSCOPY (EGD) WITH PROPOFOL N/A 06/25/2022   Procedure: ESOPHAGOGASTRODUODENOSCOPY (EGD) WITH PROPOFOL;  Surgeon: Toney Reil, MD;  Location: [facility];  Service: Endoscopy;  Laterality: N/A;   GALLBLADDER SURGERY     LAPAROSCOPIC APPENDECTOMY N/A 12/08/2016   Procedure: APPENDECTOMY LAPAROSCOPIC;   Surgeon: Leafy Ro, MD;  Location: [facility];  Service: General;  Laterality: N/A;   MAXIMUM ACCESS (MAS)POSTERIOR LUMBAR INTERBODY FUSION (PLIF) 1 LEVEL N/A 05/06/2013   Procedure: LUMBAR FIVE-SACRAL ONE MAXIMUM ACCESS POSTERIOR LUMBAR INTERBODY FUSION;  Surgeon: Tia Alert, MD;  Location: [facility];  Service: Neurosurgery;  Laterality: N/A;   TOE SURGERY     TUBAL LIGATION     WISDOM TOOTH EXTRACTION     Social History   Socioeconomic History   Marital status: Legally Separated    Spouse name: Not on file   Number of children: Not on file   Years of education: Not on file   Highest education level: Not on file  Occupational History   Not on file  Tobacco Use   Smoking status: Every Day    Packs/day: 0.75    Years: 30.00    Additional pack years: 0.00    Total pack years: 22.50    Types: Cigarettes    Last attempt to quit: 05/30/2018    Years since quitting: 4.2   Smokeless tobacco: Never   Tobacco comments:    Started smoking around age 81  Vaping Use   Vaping Use: Some days  Substance and Sexual Activity   Alcohol use: Not Currently    Comment: was 40 beers/wk.  None since 06/03/22   Drug use: No   Sexual activity: Yes    Partners: Female, Female    Birth  control/protection: Surgical    Comment: Hysterectomy  Other Topics Concern   Not on file  Social History Narrative   Not on file   Social Determinants of Health   Financial Resource Strain: High Risk (09/18/2021)   Overall Financial Resource Strain (CARDIA)    Difficulty of Paying Living Expenses: Hard  Food Insecurity: No Food Insecurity (06/07/2022)   Hunger Vital Sign    Worried About Running Out of Food in the Last Year: Never true    Ran Out of Food in the Last Year: Never true  Transportation Needs: No Transportation Needs (06/07/2022)   PRAPARE - Administrator, Civil Service (Medical): No    Lack of Transportation (Non-Medical): No  Physical Activity: Not on file  Stress: Not on file   Social Connections: Socially Isolated (09/18/2021)   Social Connection and Isolation Panel [NHANES]    Frequency of Communication with Friends and Family: More than three times a week    Frequency of Social Gatherings with Friends and Family: Three times a week    Attends Religious Services: Never    Active Member of Clubs or Organizations: No    Attends Banker Meetings: Never    Marital Status: Separated  Intimate Partner Violence: Not At Risk (06/07/2022)   Humiliation, Afraid, Rape, and Kick questionnaire    Fear of Current or Ex-Partner: No    Emotionally Abused: No    Physically Abused: No    Sexually Abused: No   Family Status  Relation Name Status   Mother  Alive   Father  Alive   Mat Aunt  Deceased   Mat Aunt  Deceased   Pat Uncle  Deceased   MGM  Deceased   MGF  Deceased   Family History  Problem Relation Age of Onset   Hypertension Mother    COPD Mother    Epilepsy Mother    Sleep apnea Mother    Hypertension Father    Sleep apnea Father    Breast cancer Maternal Aunt 42   Ovarian cancer Maternal Aunt 45   Colon cancer Paternal Uncle 58   Pancreatic cancer Maternal Grandmother 56   Colon cancer Maternal Grandfather 63   Allergies  Allergen Reactions   Codeine Nausea And Vomiting   Sulfa Antibiotics Rash   Vicodin [Hydrocodone-Acetaminophen] Nausea Only    And itching all over    Patient Care Team: [name], FNP as PCP - General (Family Medicine)   Medications: Outpatient Medications Prior to Visit  Medication Sig   Blood Glucose Monitoring Suppl DEVI 1 each by Does not apply route 2 (two) times daily as needed. May substitute to any manufacturer covered by patient's insurance.   cephALEXin (KEFLEX) 500 MG capsule Take 1 capsule (500 mg total) by mouth 2 (two) times daily.   Cholecalciferol (VITAMIN D3) 50 MCG (2000 UT) CAPS Take 1 capsule (2,000 Units total) by mouth daily.   co-enzyme Q-10 30 MG capsule Take 30 mg by mouth 3 (three)  times daily.   DOTTI 0.0375 MG/24HR Place 1 patch onto the skin 2 (two) times a week.   fluconazole (DIFLUCAN) 150 MG tablet Take 1 tablet (150 mg total) by mouth every 3 (three) days as needed.   FLUoxetine (PROZAC) 40 MG capsule Take 1 capsule (40 mg total) by mouth daily.   hydrOXYzine (ATARAX) 25 MG tablet Take 25 mg by mouth every 8 (eight) hours as needed for anxiety.   Magnesium 400 MG CAPS Take 400  mg by mouth daily.   nortriptyline (PAMELOR) 10 MG capsule Take nortriptyline 10 mg at night for one week then increase to 20 mg at night and continue this dose.   omeprazole (PRILOSEC) 40 MG capsule Take 1 capsule (40 mg total) by mouth daily.   phenazopyridine (PYRIDIUM) 100 MG tablet Take 1 tablet (100 mg total) by mouth 3 (three) times daily as needed for pain.   progesterone (PROMETRIUM) 100 MG capsule Take 100 mg by mouth at bedtime.   rosuvastatin (CRESTOR) 10 MG tablet Take 1 tablet (10 mg total) by mouth daily.   traZODone (DESYREL) 100 MG tablet Take 1-2 tablets PO as needed to assist with sleep   valACYclovir (VALTREX) 500 MG tablet Take 2 tablets (1,000 mg total) by mouth 2 (two) times daily.   Vitamin D, Ergocalciferol, (DRISDOL) 1.25 MG (50000 UNIT) CAPS capsule Take 1 capsule (50,000 Units total) by mouth every 7 (seven) days.   omeprazole (PRILOSEC) 20 MG capsule Take 1 capsule (20 mg total) by mouth 2 (two) times daily before a meal for 14 days.   No facility-administered medications prior to visit.   Review of Systems  Respiratory:  Positive for cough.    Last CBC Lab Results  Component Value Date   WBC 8.2 07/04/2022   HGB 12.6 07/04/2022   HCT 37.3 07/04/2022   MCV 89.7 07/04/2022   MCH 30.3 07/04/2022   RDW 13.3 07/04/2022   PLT 270 07/04/2022   Last metabolic panel Lab Results  Component Value Date   GLUCOSE 104 (H) 06/21/2022   NA 135 06/21/2022   K 4.8 06/21/2022   CL 97 06/21/2022   CO2 24 06/21/2022   BUN 7 06/21/2022   CREATININE 0.87 06/21/2022    EGFR 84 06/21/2022   CALCIUM 9.2 06/21/2022   PHOS 3.7 12/16/2016   PROT 7.3 06/20/2022   ALBUMIN 3.8 (L) 06/20/2022   LABGLOB 2.5 03/15/2022   AGRATIO 1.9 03/15/2022   BILITOT 0.3 06/20/2022   ALKPHOS 304 (H) 06/20/2022   AST 66 (H) 06/20/2022   ALT 92 (H) 06/20/2022   ANIONGAP 7 06/12/2022   Last lipids Lab Results  Component Value Date   CHOL 220 (H) 06/21/2022   HDL 28 (L) 06/21/2022   LDLCALC 103 (H) 06/21/2022   TRIG 524 (H) 06/21/2022   CHOLHDL 7.9 (H) 06/21/2022   Last hemoglobin A1c Lab Results  Component Value Date   HGBA1C 5.2 09/03/2021   Last thyroid functions Lab Results  Component Value Date   TSH 2.437 06/07/2022   Last vitamin D Lab Results  Component Value Date   VD25OH 18.4 (L) 07/22/2022     Objective    BP 125/85 (BP Location: Right Arm, Patient Position: Sitting, Cuff Size: Normal)   Pulse 83   Temp 98.8 F (37.1 C) (Oral)   Ht 5\' 6"  (1.676 m)   Wt 195 lb 3.2 oz (88.5 kg)   SpO2 100%   BMI 31.51 kg/m   BP Readings from Last 3 Encounters:  09/10/22 125/85  07/23/22 111/78  07/04/22 114/77   Wt Readings from Last 3 Encounters:  09/10/22 195 lb 3.2 oz (88.5 kg)  07/23/22 190 lb (86.2 kg)  07/04/22 189 lb (85.7 kg)   SpO2 Readings from Last 3 Encounters:  09/10/22 100%  07/04/22 95%  06/25/22 96%    Physical Exam Vitals and nursing note reviewed.  Constitutional:      General: She is awake. She is not in acute distress.  Appearance: Normal appearance. She is well-developed and well-groomed. She is obese. She is not ill-appearing, toxic-appearing or diaphoretic.  HENT:     Head: Normocephalic and atraumatic.     Jaw: There is normal jaw occlusion. No trismus, tenderness, swelling or pain on movement.     Right Ear: Hearing, tympanic membrane, ear canal and external ear normal. There is no impacted cerumen.     Left Ear: Hearing, tympanic membrane, ear canal and external ear normal. There is no impacted cerumen.      Nose: Nose normal. No congestion or rhinorrhea.     Right Turbinates: Not enlarged, swollen or pale.     Left Turbinates: Not enlarged, swollen or pale.     Right Sinus: No maxillary sinus tenderness or frontal sinus tenderness.     Left Sinus: No maxillary sinus tenderness or frontal sinus tenderness.     Mouth/Throat:     Lips: Pink.     Mouth: Mucous membranes are moist. No injury.     Tongue: No lesions.     Pharynx: Oropharynx is clear. Uvula midline. No pharyngeal swelling, oropharyngeal exudate, posterior oropharyngeal erythema or uvula swelling.     Tonsils: No tonsillar exudate or tonsillar abscesses.  Eyes:     General: Lids are normal. Lids are everted, no foreign bodies appreciated. Vision grossly intact. Gaze aligned appropriately. No allergic shiner or visual field deficit.       Right eye: No discharge.        Left eye: No discharge.     Extraocular Movements: Extraocular movements intact.     Conjunctiva/sclera: Conjunctivae normal.     Right eye: Right conjunctiva is not injected. No exudate.    Left eye: Left conjunctiva is not injected. No exudate.    Pupils: Pupils are equal, round, and reactive to light.  Neck:     Thyroid: No thyroid mass, thyromegaly or thyroid tenderness.     Vascular: No carotid bruit.     Trachea: Trachea normal.  Cardiovascular:     Rate and Rhythm: Normal rate and regular rhythm.     Pulses: Normal pulses.          Carotid pulses are 2+ on the right side and 2+ on the left side.      Radial pulses are 2+ on the right side and 2+ on the left side.       Dorsalis pedis pulses are 2+ on the right side and 2+ on the left side.       Posterior tibial pulses are 2+ on the right side and 2+ on the left side.     Heart sounds: Normal heart sounds, S1 normal and S2 normal. No murmur heard.    No friction rub. No gallop.  Pulmonary:     Effort: Pulmonary effort is normal. No respiratory distress.     Breath sounds: Normal breath sounds and air  entry. No stridor. No wheezing, rhonchi or rales.  Chest:     Chest wall: No tenderness.     Comments: Breasts: risk and benefit of breast self-exam was discussed, not examined  Abdominal:     General: Abdomen is flat. Bowel sounds are normal. There is no distension.     Palpations: Abdomen is soft. There is no mass.     Tenderness: There is no abdominal tenderness. There is no right CVA tenderness, left CVA tenderness, guarding or rebound.     Hernia: No hernia is present.     Comments: Reports bloating  in RLQ/LLQ; normal exam at this time. Will repeat H Pylori to ensure eradication of bacteria   Genitourinary:    Comments: Exam deferred; hx of partial hysterectomy Musculoskeletal:        General: No swelling, tenderness, deformity or signs of injury. Normal range of motion.     Cervical back: Full passive range of motion without pain, normal range of motion and neck supple. No edema, rigidity or tenderness. No muscular tenderness.     Right lower leg: No edema.     Left lower leg: No edema.  Lymphadenopathy:     Cervical: No cervical adenopathy.     Right cervical: No superficial, deep or posterior cervical adenopathy.    Left cervical: No superficial, deep or posterior cervical adenopathy.  Skin:    General: Skin is warm and dry.     Capillary Refill: Capillary refill takes less than 2 seconds.     Coloration: Skin is not jaundiced or pale.     Findings: No bruising, erythema, lesion or rash.  Neurological:     General: No focal deficit present.     Mental Status: She is alert and oriented to person, place, and time. Mental status is at baseline.     GCS: GCS eye subscore is 4. GCS verbal subscore is 5. GCS motor subscore is 6.     Sensory: Sensation is intact. No sensory deficit.     Motor: Motor function is intact. No weakness.     Coordination: Coordination is intact. Coordination normal.     Gait: Gait is intact. Gait normal.  Psychiatric:        Attention and Perception:  Attention and perception normal.        Mood and Affect: Mood and affect normal.        Speech: Speech normal.        Behavior: Behavior normal. Behavior is cooperative.        Thought Content: Thought content normal.        Cognition and Memory: Cognition and memory normal.        Judgment: Judgment normal.      Last depression screening scores    09/10/2022    8:43 AM 07/23/2022    9:56 AM 06/21/2022   10:08 AM  PHQ 2/9 Scores  PHQ - 2 Score 1 0 4  PHQ- 9 Score 2  12   Last fall risk screening    07/23/2022    9:56 AM  Fall Risk   Falls in the past year? 0  Risk for fall due to : No Fall Risks  Follow up Falls evaluation completed   Last Audit-C alcohol use screening    09/10/2022    8:41 AM  Alcohol Use Disorder Test (AUDIT)  1. How often do you have a drink containing alcohol? 3  2. How many drinks containing alcohol do you have on a typical day when you are drinking? 1  3. How often do you have six or more drinks on one occasion? 3  AUDIT-C Score 7  4. How often during the last year have you found that you were not able to stop drinking once you had started? 1  5. How often during the last year have you failed to do what was normally expected from you because of drinking? 1  6. How often during the last year have you needed a first drink in the morning to get yourself going after a heavy drinking session? 0  7. How  often during the last year have you had a feeling of guilt of remorse after drinking? 1  8. How often during the last year have you been unable to remember what happened the night before because you had been drinking? 1  9. Have you or someone else been injured as a result of your drinking? 2  10. Has a relative or friend or a doctor or another health worker been concerned about your drinking or suggested you cut down? 2  Alcohol Use Disorder Identification Test Final Score (AUDIT) 15  Alcohol Brief Interventions/Follow-up Alcohol education/Brief advice   A  score of 3 or more in women, and 4 or more in men indicates increased risk for alcohol abuse, EXCEPT if all of the points are from question 1   No results found for any visits on 09/10/22.  Assessment & Plan    Routine Health Maintenance and Physical Exam  Exercise Activities and Dietary recommendations  Goals      Manage My Emotions     Timeframe:  Long-Range Goal Priority:  Medium Start Date:      09/18/21                       Expected End Date:    03/20/22                   Follow Up Date 10/01/21   - begin personal counseling - talk about feelings with a friend, family or spiritual advisor - practice positive thinking and self-talk  -please contact therapist of your choice to schedule initial appointment: Clarice Pole [phone] [facility] [phone] [facility] [phone] [facility]  [phone]   Why is this important?   When you are stressed, down or upset, your body reacts too.  For example, your blood pressure may get higher; you may have a headache or stomachache.  When your emotions get the best of you, your body's ability to fight off cold and flu gets weak.  These steps will help you manage your emotions.     Notes:         Immunization History  Administered Date(s) Administered   Influenza Split 04/07/2007   Influenza,inj,Quad PF,6+ Mos 03/04/2014   Tdap 03/04/2014    Health Maintenance  Topic Date Due   COVID-19 Vaccine (1) Never done   INFLUENZA VACCINE  01/02/2023   DTaP/Tdap/Td (2 - Td or Tdap) 03/04/2024   Hepatitis C Screening  Completed   HIV Screening  Completed   HPV VACCINES  Aged Out    Discussed health benefits of physical activity, and encouraged her to engage in regular exercise appropriate for her age and condition.  Problem List Items Addressed This Visit       Other   Alcohol use disorder, moderate, dependence    Acute on chronic, improving per pt report Patient reports  she has cut down her ETOH use and is working on continued reduction Denies further assistance at this time Patient is aware of previous lab abnormalities       Annual physical exam - Primary    Continue to recommend routine screening for vision and dental Mammogram ordered Patient without cervix Discussed GI referral >50 years old Things to do to keep yourself healthy  - Exercise at least 30-45 minutes a day, 3-4 days a week.  - Eat a low-fat diet with lots of fruits and vegetables, up to 7-9 servings per day.  - Seatbelts can save  your life. Wear them always.  - Smoke detectors on every level of your home, check batteries every year.  - Eye Doctor - have an eye exam every 1-2 years  - Safe sex - if you may be exposed to STDs, use a condom.  - Alcohol -  If you drink, do it moderately, less than 2 drinks per day.  - Health Care Power of Attorney. Choose someone to speak for you if you are not able.  - Depression is common in our stressful world.If you're feeling down or losing interest in things you normally enjoy, please come in for a visit.  - Violence - If anyone is threatening or hurting you, please call immediately.       Relevant Orders   CBC   Comprehensive Metabolic Panel (CMET)   Gamma GT   Lipid panel   Avitaminosis D    Chronic, previously low Associated with mood disorder including depression and ETOH use/misuse Repeat labs to assist with supplementation       Relevant Orders   Vitamin D (25 hydroxy)   Chronic cough    Known tobacco use disorder; patient has reduced current use of cigarettes Notes dry cough, with occasional mucus in morning Previously started on prilosec 40 mg to assist with other causes of cough       Relevant Orders   Ambulatory referral to Pulmonology   Dyspepsia    Acute on chronic, slight improvement since treatment for H pylori and use of 40 mg prilosec; will repeat H pylori testing for eradication and refer to pulm for secondary  consult       Relevant Orders   H. pylori breath test   History of Helicobacter pylori infection    Patient request to repeat labs given ongoing complaints; previously followed by Dr Allegra Lai as well as ID       Mild episode of recurrent major depressive disorder    Chronic, stabilized Will continue current dose of prozac at 40 mg as well as 100-200 mg trazodone nightly Use of atarax TID to assist with anxiety as well as chronic itching in setting of ETOH use disorder       Relevant Medications   hydrOXYzine (ATARAX) 25 MG tablet   Tobacco use disorder    Chronic, improving Referral to pulm for further assistance  Iso chronic cough Continues to smoke 1/2-3/4 ppd  Not on a current inhaler       Return in about 6 months (around 03/12/2023) for chonic disease management.    Leilani Merl, FNP, have reviewed all documentation for this visit. The documentation on 09/10/22 for the exam, diagnosis, procedures, and orders are all accurate and complete.  [name], FNP  [facility] [phone] (phone) [phone] (fax)  [facility]

## 2022-09-10 ENCOUNTER — Ambulatory Visit (INDEPENDENT_AMBULATORY_CARE_PROVIDER_SITE_OTHER): Payer: 59 | Admitting: Family Medicine

## 2022-09-10 ENCOUNTER — Encounter: Payer: Self-pay | Admitting: Family Medicine

## 2022-09-10 VITALS — BP 125/85 | HR 83 | Temp 98.8°F | Ht 66.0 in | Wt 195.2 lb

## 2022-09-10 DIAGNOSIS — F1020 Alcohol dependence, uncomplicated: Secondary | ICD-10-CM | POA: Diagnosis not present

## 2022-09-10 DIAGNOSIS — F17210 Nicotine dependence, cigarettes, uncomplicated: Secondary | ICD-10-CM

## 2022-09-10 DIAGNOSIS — R053 Chronic cough: Secondary | ICD-10-CM | POA: Diagnosis not present

## 2022-09-10 DIAGNOSIS — Z0000 Encounter for general adult medical examination without abnormal findings: Secondary | ICD-10-CM

## 2022-09-10 DIAGNOSIS — F330 Major depressive disorder, recurrent, mild: Secondary | ICD-10-CM | POA: Diagnosis not present

## 2022-09-10 DIAGNOSIS — R1013 Epigastric pain: Secondary | ICD-10-CM

## 2022-09-10 DIAGNOSIS — Z8619 Personal history of other infectious and parasitic diseases: Secondary | ICD-10-CM

## 2022-09-10 DIAGNOSIS — E559 Vitamin D deficiency, unspecified: Secondary | ICD-10-CM

## 2022-09-10 DIAGNOSIS — F17200 Nicotine dependence, unspecified, uncomplicated: Secondary | ICD-10-CM

## 2022-09-10 NOTE — Assessment & Plan Note (Addendum)
Chronic, improving Referral to pulm for further assistance  Iso chronic cough Continues to smoke 1/2-3/4 ppd  Not on a current inhaler

## 2022-09-10 NOTE — Assessment & Plan Note (Signed)
Acute on chronic, improving per pt report Patient reports she has cut down her ETOH use and is working on continued reduction Denies further assistance at this time Patient is aware of previous lab abnormalities

## 2022-09-10 NOTE — Assessment & Plan Note (Signed)
Acute on chronic, slight improvement since treatment for H pylori and use of 40 mg prilosec; will repeat H pylori testing for eradication and refer to pulm for secondary consult

## 2022-09-10 NOTE — Assessment & Plan Note (Signed)
Chronic, stabilized Will continue current dose of prozac at 40 mg as well as 100-200 mg trazodone nightly Use of atarax TID to assist with anxiety as well as chronic itching in setting of ETOH use disorder

## 2022-09-10 NOTE — Assessment & Plan Note (Signed)
Continue to recommend routine screening for vision and dental Mammogram ordered Patient without cervix Discussed GI referral >45 years old Things to do to keep yourself healthy  - Exercise at least 30-45 minutes a day, 3-4 days a week.  - Eat a low-fat diet with lots of fruits and vegetables, up to 7-9 servings per day.  - Seatbelts can save your life. Wear them always.  - Smoke detectors on every level of your home, check batteries every year.  - Eye Doctor - have an eye exam every 1-2 years  - Safe sex - if you may be exposed to STDs, use a condom.  - Alcohol -  If you drink, do it moderately, less than 2 drinks per day.  - Health Care Power of Attorney. Choose someone to speak for you if you are not able.  - Depression is common in our stressful world.If you're feeling down or losing interest in things you normally enjoy, please come in for a visit.  - Violence - If anyone is threatening or hurting you, please call immediately.

## 2022-09-10 NOTE — Assessment & Plan Note (Signed)
Patient request to repeat labs given ongoing complaints; previously followed by Dr Allegra Lai as well as ID

## 2022-09-10 NOTE — Assessment & Plan Note (Signed)
Chronic, previously low Associated with mood disorder including depression and ETOH use/misuse Repeat labs to assist with supplementation

## 2022-09-10 NOTE — Assessment & Plan Note (Addendum)
Known tobacco use disorder; patient has reduced current use of cigarettes Notes dry cough, with occasional mucus in morning Previously started on prilosec 40 mg to assist with other causes of cough

## 2022-09-11 LAB — COMPREHENSIVE METABOLIC PANEL
Bilirubin Total: <0.2 mg/dL (ref 0.0–1.2)
CO2: 21 mmol/L (ref 20–29)
Calcium: 9.6 mg/dL (ref 8.7–10.2)
Total Protein: 7.5 g/dL (ref 6.0–8.5)

## 2022-09-11 LAB — CBC
Hematocrit: 39.8 % (ref 34.0–46.6)
MCH: 30.2 pg (ref 26.6–33.0)
RBC: 4.5 x10E6/uL (ref 3.77–5.28)

## 2022-09-11 LAB — LIPID PANEL
Triglycerides: 321 mg/dL — ABNORMAL HIGH (ref 0–149)
VLDL Cholesterol Cal: 47 mg/dL — ABNORMAL HIGH (ref 5–40)

## 2022-09-11 LAB — GAMMA GT: GGT: 134 IU/L — ABNORMAL HIGH (ref 0–60)

## 2022-09-11 NOTE — Progress Notes (Signed)
Hi [name],  Your reduction of alcohol has allowed your liver enzymes to stabilize; however, your GGT remains elevated which may indicate some liver damage. I continue to recommend the reduction to 1-2 drinks/day avoiding excess consumption with goal of minimal use or cessation from both alcohol and use of NSAIDs to assist. Blood chemistry remains stable otherwise.  Cholesterol is at goal; elevated fats/triglycerides remain. Current risk of heart attack and stroke remains stabilized at 1%. The 10-year ASCVD risk score (Arnett DK, et al., 2019) is: 1%   Values used to calculate the score:     Age: 47 years     Sex: Female     Is Non-Hispanic African American: No     Diabetic: No     Tobacco smoker: Yes     Systolic Blood Pressure: 125 mmHg     Is BP treated: No     HDL Cholesterol: 82 mg/dL     Total Cholesterol: 172 mg/dL  Stable cell count; improved Vit d (continue supplementation).   Breath test pending.  Please let us know if you do not hear from pulmonary team.  [name], FNP  [facility] [address] [place], [place] [address] [phone] (phone) [phone] (fax) [facility]

## 2022-09-12 ENCOUNTER — Telehealth: Payer: 59 | Admitting: Physician Assistant

## 2022-09-12 DIAGNOSIS — J019 Acute sinusitis, unspecified: Secondary | ICD-10-CM

## 2022-09-12 DIAGNOSIS — B9689 Other specified bacterial agents as the cause of diseases classified elsewhere: Secondary | ICD-10-CM

## 2022-09-12 LAB — COMPREHENSIVE METABOLIC PANEL
ALT: 35 IU/L — ABNORMAL HIGH (ref 0–32)
AST: 39 IU/L (ref 0–40)
Albumin/Globulin Ratio: 1.7 (ref 1.2–2.2)
Albumin: 4.7 g/dL (ref 3.9–4.9)
Alkaline Phosphatase: 100 IU/L (ref 44–121)
BUN/Creatinine Ratio: 11 (ref 9–23)
BUN: 9 mg/dL (ref 6–24)
Chloride: 102 mmol/L (ref 96–106)
Creatinine, Ser: 0.8 mg/dL (ref 0.57–1.00)
Globulin, Total: 2.8 g/dL (ref 1.5–4.5)
Glucose: 90 mg/dL (ref 70–99)
Potassium: 5.1 mmol/L (ref 3.5–5.2)
Sodium: 138 mmol/L (ref 134–144)
eGFR: 93 mL/min/{1.73_m2} (ref >59)

## 2022-09-12 LAB — CBC
Hemoglobin: 13.6 g/dL (ref 11.1–15.9)
MCHC: 34.2 g/dL (ref 31.5–35.7)
MCV: 88 fL (ref 79–97)
Platelets: 256 10*3/uL (ref 150–450)
RDW: 12.5 % (ref 11.7–15.4)
WBC: 5.4 10*3/uL (ref 3.4–10.8)

## 2022-09-12 LAB — LIPID PANEL
Chol/HDL Ratio: 2.1 ratio (ref 0.0–4.4)
Cholesterol, Total: 172 mg/dL (ref 100–199)
HDL: 82 mg/dL (ref >39)
LDL Chol Calc (NIH): 43 mg/dL (ref 0–99)

## 2022-09-12 LAB — H. PYLORI BREATH TEST: H pylori Breath Test: NEGATIVE

## 2022-09-12 LAB — VITAMIN D 25 HYDROXY (VIT D DEFICIENCY, FRACTURES): Vit D, 25-Hydroxy: 33.3 ng/mL (ref 30.0–100.0)

## 2022-09-12 MED ORDER — FLUTICASONE PROPIONATE 50 MCG/ACT NA SUSP
2.0000 | Freq: Every day | NASAL | 0 refills | Status: DC
Start: 2022-09-12 — End: 2023-03-20

## 2022-09-12 MED ORDER — DOXYCYCLINE HYCLATE 100 MG PO TABS
100.0000 mg | ORAL_TABLET | Freq: Two times a day (BID) | ORAL | 0 refills | Status: DC
Start: 2022-09-12 — End: 2022-09-23

## 2022-09-12 MED ORDER — BENZONATATE 100 MG PO CAPS
100.0000 mg | ORAL_CAPSULE | Freq: Three times a day (TID) | ORAL | 0 refills | Status: DC | PRN
Start: 2022-09-12 — End: 2023-01-22

## 2022-09-12 NOTE — Patient Instructions (Signed)
[name], thank you for joining [name], PA-C for today's virtual visit.  While this provider is not your primary care provider (PCP), if your PCP is located in our provider database this encounter information will be shared with them immediately following your visit.   A [facility] MyChart account gives you access to today's visit and all your visits, tests, and labs performed at [facility] " click here if you don't have a [facility] MyChart account or go to [email or web address]  Consent: (Patient) [name] provided verbal consent for this virtual visit at the beginning of the encounter.  Current Medications:  Current Outpatient Medications:    Blood Glucose Monitoring Suppl DEVI, 1 each by Does not apply route 2 (two) times daily as needed. May substitute to any manufacturer covered by patient's insurance., Disp: 1 each, Rfl: 0   cephALEXin (KEFLEX) 500 MG capsule, Take 1 capsule (500 mg total) by mouth 2 (two) times daily., Disp: 14 capsule, Rfl: 0   Cholecalciferol (VITAMIN D3) 50 MCG (2000 UT) CAPS, Take 1 capsule (2,000 Units total) by mouth daily., Disp: 30 capsule, Rfl: 4   co-enzyme Q-10 30 MG capsule, Take 30 mg by mouth 3 (three) times daily., Disp: , Rfl:    DOTTI 0.0375 MG/24HR, Place 1 patch onto the skin 2 (two) times a week., Disp: , Rfl:    FLUoxetine (PROZAC) 40 MG capsule, Take 1 capsule (40 mg total) by mouth daily., Disp: 90 capsule, Rfl: 3   hydrOXYzine (ATARAX) 25 MG tablet, Take 25 mg by mouth every 8 (eight) hours as needed for anxiety., Disp: , Rfl:    Magnesium 400 MG CAPS, Take 400 mg by mouth daily., Disp: , Rfl:    nortriptyline (PAMELOR) 10 MG capsule, Take nortriptyline 10 mg at night for one week then increase to 20 mg at night and continue this dose., Disp: , Rfl:    omeprazole (PRILOSEC) 40 MG capsule, Take 1 capsule (40 mg total) by mouth daily., Disp: 30 capsule, Rfl: 2   phenazopyridine (PYRIDIUM) 100 MG tablet,  Take 1 tablet (100 mg total) by mouth 3 (three) times daily as needed for pain., Disp: 10 tablet, Rfl: 0   progesterone (PROMETRIUM) 100 MG capsule, Take 100 mg by mouth at bedtime., Disp: , Rfl:    rosuvastatin (CRESTOR) 10 MG tablet, Take 1 tablet (10 mg total) by mouth daily., Disp: 90 tablet, Rfl: 3   traZODone (DESYREL) 100 MG tablet, Take 1-2 tablets PO as needed to assist with sleep, Disp: 180 tablet, Rfl: 3   valACYclovir (VALTREX) 500 MG tablet, Take 2 tablets (1,000 mg total) by mouth 2 (two) times daily., Disp: 20 tablet, Rfl: 11   Vitamin D, Ergocalciferol, (DRISDOL) 1.25 MG (50000 UNIT) CAPS capsule, Take 1 capsule (50,000 Units total) by mouth every 7 (seven) days., Disp: 12 capsule, Rfl: 0   Medications ordered in this encounter:  No orders of the defined types were placed in this encounter.    *If you need refills on other medications prior to your next appointment, please contact your pharmacy*  Follow-Up: Call back or seek an in-person evaluation if the symptoms worsen or if the condition fails to improve as anticipated.  [facility] [phone]  Other Instructions Sinus Infection, Adult A sinus infection, also called sinusitis, is inflammation of your sinuses. Sinuses are hollow spaces in the bones around your face. Your sinuses are located: Around your eyes. In the middle of your forehead. Behind your nose.  In your cheekbones. Mucus normally drains out of your sinuses. When your nasal tissues become inflamed or swollen, mucus can become trapped or blocked. This allows bacteria, viruses, and fungi to grow, which leads to infection. Most infections of the sinuses are caused by a virus. A sinus infection can develop quickly. It can last for up to 4 weeks (acute) or for more than 12 weeks (chronic). A sinus infection often develops after a cold. What are the causes? This condition is caused by anything that creates swelling in the sinuses or stops mucus  from draining. This includes: Allergies. Asthma. Infection from bacteria or viruses. Deformities or blockages in your nose or sinuses. Abnormal growths in the nose (nasal polyps). Pollutants, such as chemicals or irritants in the air. Infection from fungi. This is rare. What increases the risk? You are more likely to develop this condition if you: Have a weak body defense system (immune system). Do a lot of swimming or diving. Overuse nasal sprays. Smoke. What are the signs or symptoms? The main symptoms of this condition are pain and a feeling of pressure around the affected sinuses. Other symptoms include: Stuffy nose or congestion that makes it difficult to breathe through your nose. Thick yellow or greenish drainage from your nose. Tenderness, swelling, and warmth over the affected sinuses. A cough that may get worse at night. Decreased sense of smell and taste. Extra mucus that collects in the throat or the back of the nose (postnasal drip) causing a sore throat or bad breath. Tiredness (fatigue). Fever. How is this diagnosed? This condition is diagnosed based on: Your symptoms. Your medical history. A physical exam. Tests to find out if your condition is acute or chronic. This may include: Checking your nose for nasal polyps. Viewing your sinuses using a device that has a light (endoscope). Testing for allergies or bacteria. Imaging tests, such as an MRI or CT scan. In rare cases, a bone biopsy may be done to rule out more serious types of fungal sinus disease. How is this treated? Treatment for a sinus infection depends on the cause and whether your condition is chronic or acute. If caused by a virus, your symptoms should go away on their own within 10 days. You may be given medicines to relieve symptoms. They include: Medicines that shrink swollen nasal passages (decongestants). A spray that eases inflammation of the nostrils (topical intranasal  corticosteroids). Rinses that help get rid of thick mucus in your nose (nasal saline washes). Medicines that treat allergies (antihistamines). Over-the-counter pain relievers. If caused by bacteria, your health care provider may recommend waiting to see if your symptoms improve. Most bacterial infections will get better without antibiotic medicine. You may be given antibiotics if you have: A severe infection. A weak immune system. If caused by narrow nasal passages or nasal polyps, surgery may be needed. Follow these instructions at home: Medicines Take, use, or apply over-the-counter and prescription medicines only as told by your health care provider. These may include nasal sprays. If you were prescribed an antibiotic medicine, take it as told by your health care provider. Do not stop taking the antibiotic even if you start to feel better. Hydrate and humidify  Drink enough fluid to keep your urine pale yellow. Staying hydrated will help to thin your mucus. Use a cool mist humidifier to keep the humidity level in your home above 50%. Inhale steam for 10-15 minutes, 3-4 times a day, or as told by your health care provider. You can do  this in the bathroom while a hot shower is running. Limit your exposure to cool or dry air. Rest Rest as much as possible. Sleep with your head raised (elevated). Make sure you get enough sleep each night. General instructions  Apply a warm, moist washcloth to your face 3-4 times a day or as told by your health care provider. This will help with discomfort. Use nasal saline washes as often as told by your health care provider. Wash your hands often with soap and water to reduce your exposure to germs. If soap and water are not available, use hand sanitizer. Do not smoke. Avoid being around people who are smoking (secondhand smoke). Keep all follow-up visits. This is important. Contact a health care provider if: You have a fever. Your symptoms get  worse. Your symptoms do not improve within 10 days. Get help right away if: You have a severe headache. You have persistent vomiting. You have severe pain or swelling around your face or eyes. You have vision problems. You develop confusion. Your neck is stiff. You have trouble breathing. These symptoms may be an emergency. Get help right away. Call 911. Do not wait to see if the symptoms will go away. Do not drive yourself to the hospital. Summary A sinus infection is soreness and inflammation of your sinuses. Sinuses are hollow spaces in the bones around your face. This condition is caused by nasal tissues that become inflamed or swollen. The swelling traps or blocks the flow of mucus. This allows bacteria, viruses, and fungi to grow, which leads to infection. If you were prescribed an antibiotic medicine, take it as told by your health care provider. Do not stop taking the antibiotic even if you start to feel better. Keep all follow-up visits. This is important. This information is not intended to replace advice given to you by your health care provider. Make sure you discuss any questions you have with your health care provider. Document Revised: 04/24/2021 Document Reviewed: 04/24/2021 Elsevier Patient Education  2023 Elsevier Inc.    If you have been instructed to have an in-person evaluation today at a local Urgent Care facility, please use the link below. It will take you to a list of all of our available [facility], including address, phone number and hours of operation. Please do not delay care.  [facility]  If you or a family member do not have a primary care provider, use the link below to schedule a visit and establish care. When you choose a [facility] primary care physician or advanced practice provider, you gain a long-term partner in health. Find a Primary Care Provider  Learn more about [facility]'s in-office and virtual care  options: [facility] Get Care Now

## 2022-09-12 NOTE — Progress Notes (Signed)
Successful clearance of H Pylori bacteria.

## 2022-09-12 NOTE — Progress Notes (Signed)
Virtual Visit Consent   [name], you are scheduled for a virtual visit with a [facility] provider today. Just as with appointments in the office, your consent must be obtained to participate. Your consent will be active for this visit and any virtual visit you may have with one of our providers in the next 365 days. If you have a MyChart account, a copy of this consent can be sent to you electronically.  As this is a virtual visit, video technology does not allow for your provider to perform a traditional examination. This may limit your provider's ability to fully assess your condition. If your provider identifies any concerns that need to be evaluated in person or the need to arrange testing (such as labs, EKG, etc.), we will make arrangements to do so. Although advances in technology are sophisticated, we cannot ensure that it will always work on either your end or our end. If the connection with a video visit is poor, the visit may have to be switched to a telephone visit. With either a video or telephone visit, we are not always able to ensure that we have a secure connection.  By engaging in this virtual visit, you consent to the provision of healthcare and authorize for your insurance to be billed (if applicable) for the services provided during this visit. Depending on your insurance coverage, you may receive a charge related to this service.  I need to obtain your verbal consent now. Are you willing to proceed with your visit today? [name] has provided verbal consent on 09/12/2022 for a virtual visit (video or telephone). [name], [place]  Date: 09/12/2022 11:20 AM  Virtual Visit via Video Note   I, [name], connected with  [name]  ([id number], 1976/03/03) on 09/12/22 at 11:15 AM EDT by a video-enabled telemedicine application and verified that I am speaking with the correct person using two identifiers.  Location: Patient: Virtual Visit Location  Patient: Home Provider: Virtual Visit Location Provider: Home Office   I discussed the limitations of evaluation and management by telemedicine and the availability of in person appointments. The patient expressed understanding and agreed to proceed.    History of Present Illness: [name] is a 47 y.o. who identifies as a female who was assigned female at birth, and is being seen today for concern of a potential sinus infection. Notes sinus symptoms over the past few days with increased nasal congestion, sinus pressure and now sinus pain. Now with low-grade fever. Has been dealing with some mild cough and nasal congestion over the past couple of weeks. Denies recent travel or sick contact.    HPI: HPI  Problems:  Patient Active Problem List   Diagnosis Date Noted   Chronic cough 09/10/2022   Avitaminosis D 09/10/2022   History of Helicobacter pylori infection 09/10/2022   Mild episode of recurrent major depressive disorder 09/10/2022   Tobacco use disorder 09/10/2022   Alcohol use disorder, moderate, dependence 09/10/2022   Dyspepsia 06/25/2022   Early satiety 06/21/2022   Daily headache 06/21/2022   Hospital discharge follow-up 06/21/2022   CMV (cytomegalovirus infection) status positive 06/13/2022   Fever 06/04/2022   Annual physical exam 09/03/2021   BRCA negative 08/10/2018    Allergies:  Allergies  Allergen Reactions   Codeine Nausea And Vomiting   Sulfa Antibiotics Rash   Vicodin [Hydrocodone-Acetaminophen] Nausea Only    And itching all over   Medications:  Current Outpatient Medications:    benzonatate (TESSALON)  100 MG capsule, Take 1 capsule (100 mg total) by mouth 3 (three) times daily as needed for cough., Disp: 30 capsule, Rfl: 0   doxycycline (VIBRA-TABS) 100 MG tablet, Take 1 tablet (100 mg total) by mouth 2 (two) times daily., Disp: 20 tablet, Rfl: 0   fluticasone (FLONASE) 50 MCG/ACT nasal spray, Place 2 sprays into both nostrils daily., Disp: 16 g, Rfl:  0   Blood Glucose Monitoring Suppl DEVI, 1 each by Does not apply route 2 (two) times daily as needed. May substitute to any manufacturer covered by patient's insurance., Disp: 1 each, Rfl: 0   Cholecalciferol (VITAMIN D3) 50 MCG (2000 UT) CAPS, Take 1 capsule (2,000 Units total) by mouth daily., Disp: 30 capsule, Rfl: 4   co-enzyme Q-10 30 MG capsule, Take 30 mg by mouth 3 (three) times daily., Disp: , Rfl:    DOTTI 0.0375 MG/24HR, Place 1 patch onto the skin 2 (two) times a week., Disp: , Rfl:    FLUoxetine (PROZAC) 40 MG capsule, Take 1 capsule (40 mg total) by mouth daily., Disp: 90 capsule, Rfl: 3   hydrOXYzine (ATARAX) 25 MG tablet, Take 25 mg by mouth every 8 (eight) hours as needed for anxiety., Disp: , Rfl:    Magnesium 400 MG CAPS, Take 400 mg by mouth daily., Disp: , Rfl:    nortriptyline (PAMELOR) 10 MG capsule, Take nortriptyline 10 mg at night for one week then increase to 20 mg at night and continue this dose., Disp: , Rfl:    omeprazole (PRILOSEC) 40 MG capsule, Take 1 capsule (40 mg total) by mouth daily., Disp: 30 capsule, Rfl: 2   phenazopyridine (PYRIDIUM) 100 MG tablet, Take 1 tablet (100 mg total) by mouth 3 (three) times daily as needed for pain., Disp: 10 tablet, Rfl: 0   progesterone (PROMETRIUM) 100 MG capsule, Take 100 mg by mouth at bedtime., Disp: , Rfl:    rosuvastatin (CRESTOR) 10 MG tablet, Take 1 tablet (10 mg total) by mouth daily., Disp: 90 tablet, Rfl: 3   traZODone (DESYREL) 100 MG tablet, Take 1-2 tablets PO as needed to assist with sleep, Disp: 180 tablet, Rfl: 3   valACYclovir (VALTREX) 500 MG tablet, Take 2 tablets (1,000 mg total) by mouth 2 (two) times daily., Disp: 20 tablet, Rfl: 11   Vitamin D, Ergocalciferol, (DRISDOL) 1.25 MG (50000 UNIT) CAPS capsule, Take 1 capsule (50,000 Units total) by mouth every 7 (seven) days., Disp: 12 capsule, Rfl: 0  Observations/Objective: Patient is well-developed, well-nourished in no acute distress.  Resting  comfortably at home.  Head is normocephalic, atraumatic.  No labored breathing.  Speech is clear and coherent with logical content.  Patient is alert and oriented at baseline.   Assessment and Plan: 1. Acute bacterial sinusitis - doxycycline (VIBRA-TABS) 100 MG tablet; Take 1 tablet (100 mg total) by mouth 2 (two) times daily.  Dispense: 20 tablet; Refill: 0 - fluticasone (FLONASE) 50 MCG/ACT nasal spray; Place 2 sprays into both nostrils daily.  Dispense: 16 g; Refill: 0 - benzonatate (TESSALON) 100 MG capsule; Take 1 capsule (100 mg total) by mouth 3 (three) times daily as needed for cough.  Dispense: 30 capsule; Refill: 0  Rx Doxycycline.  Increase fluids.  Rest.  Saline nasal spray.  Probiotic.  Mucinex as directed.  Humidifier in bedroom. Tessalon and Flonase per orders.  Call or return to clinic if symptoms are not improving.   Follow Up Instructions: I discussed the assessment and treatment plan with the patient. The patient  was provided an opportunity to ask questions and all were answered. The patient agreed with the plan and demonstrated an understanding of the instructions.  A copy of instructions were sent to the patient via MyChart unless otherwise noted below.   The patient was advised to call back or seek an in-person evaluation if the symptoms worsen or if the condition fails to improve as anticipated.  Time:  I spent 10 minutes with the patient via telehealth technology discussing the above problems/concerns.    Leeanne Rio, PA-C

## 2022-09-23 ENCOUNTER — Other Ambulatory Visit: Payer: Self-pay

## 2022-09-23 ENCOUNTER — Ambulatory Visit (INDEPENDENT_AMBULATORY_CARE_PROVIDER_SITE_OTHER): Payer: 59 | Admitting: Gastroenterology

## 2022-09-23 ENCOUNTER — Encounter: Payer: Self-pay | Admitting: Gastroenterology

## 2022-09-23 VITALS — BP 131/88 | HR 79 | Temp 99.5°F | Ht 66.0 in | Wt 192.4 lb

## 2022-09-23 DIAGNOSIS — R197 Diarrhea, unspecified: Secondary | ICD-10-CM | POA: Diagnosis not present

## 2022-09-23 DIAGNOSIS — Z1211 Encounter for screening for malignant neoplasm of colon: Secondary | ICD-10-CM

## 2022-09-23 MED ORDER — NA SULFATE-K SULFATE-MG SULF 17.5-3.13-1.6 GM/177ML PO SOLN
354.0000 mL | Freq: Once | ORAL | 0 refills | Status: AC
Start: 2022-09-23 — End: 2022-09-23

## 2022-09-23 MED ORDER — PROMETHAZINE HCL 12.5 MG PO TABS: 12.5000 mg | ORAL_TABLET | Freq: Three times a day (TID) | ORAL | 0 refills | Status: DC | PRN

## 2022-09-23 NOTE — Patient Instructions (Signed)
Lactose-Free Diet, Adult If you have lactose intolerance, you are not able to digest lactose. Lactose is a natural sugar found mainly in dairy milk and dairy products. A lactose-free diet can help you avoid foods and beverages that contain lactose. What are tips for following this plan? Reading food labels Do not consume foods, beverages, vitamins, minerals, or medicines containing lactose. Read ingredient lists carefully. Look for the words "lactose-free" on labels. Meal planning Use alternatives to dairy milk and foods made with milk products. These include the following: Lactose-free milk. Soy milk with added calcium and vitamin D. Almond milk, coconut milk, rice milk, or other nondairy milk alternatives with added calcium and vitamin D. Note that a lot of these are low in protein. Soy products, such as soy yogurt, soy cheese, soy ice cream, and soy-based sour cream. Other nut milk products, such as almond yogurt, almond cheese, cashew yogurt, cashew cheese, cashew ice cream, coconut yogurt, and coconut ice cream. Medicines, vitamins, and supplements Use lactase enzyme drops or tablets as directed by your health care provider. Make sure you get enough calcium and vitamin D in your diet. A lactose-free eating plan can be lacking in these important nutrients. Take calcium and vitamin D supplements as directed by your health care provider. Talk with your health care provider about supplements if you are not able to get enough calcium and vitamin D from food. What foods should I eat?  Fruits All fresh, canned, frozen, or dried fruits and fruit juices that are not processed with lactose. Vegetables All fresh, frozen, and canned vegetables without cheese, cream, or butter sauces. Grains Any that are not made with dairy milk or dairy products. Meats and other proteins Any meat, fish, poultry, and other protein sources that are not made with dairy milk or dairy products. Fats and oils Any that  are not made with dairy milk or dairy products. Sweets and desserts Any that are not made with dairy milk or dairy products. Seasonings and condiments Any that are not made with dairy milk or dairy products. Calcium Calcium is found in many foods that contain lactose and is important for bone health. The amount of calcium you need depends on your age: Adults [place] than 50 years: 1,000 mg of calcium a day. Adults older than 50 years: 1,200 mg of calcium a day. If you are not getting enough calcium, you may get it from other sources, including: Orange juice that has been fortified with calcium. This means that calcium has been added to the product. There are 300-350 mg of calcium in 1 cup (237 mL) of calcium-fortified orange juice. Soy milk fortified with calcium. There are 300-400 mg of calcium in 1 cup (237 mL) of calcium-fortified soy milk. Rice or almond milk fortified with calcium. There are 300 mg of calcium in 1 cup (237 mL) of calcium-fortified rice or almond milk. Breakfast cereals fortified with calcium. There are 100-1,000 mg of calcium in calcium-fortified breakfast cereals. Spinach, cooked. There are 145 mg of calcium in  cup (90 g) of cooked spinach. Edamame, cooked. There are 130 mg of calcium in  cup (47 g) of cooked edamame. Collard greens, cooked. There are 125 mg of calcium in  cup (85 g) of cooked collard greens. Kale, frozen or cooked. There are 90 mg of calcium in  cup (59 g) of cooked or frozen kale. Almonds. There are 95 mg of calcium in  cup (35 g) of almonds. Broccoli, cooked. There are 60 mg of calcium   in 1 cup (156 g) of cooked broccoli. The items listed above may not be a complete list of foods and beverages you can eat. Contact a dietitian for more options. What foods should I avoid? Lactose is found in dairy milk and dairy products, such as: Yogurt. Cheese. Butter. Margarine. Sour cream. Cream. Whipped toppings and creamers. Ice cream and other  dairy-based desserts. Lactose is also found in foods or products made with dairy milk or milk ingredients. To find out whether a food contains dairy milk or a milk ingredient, look at the ingredients list. Avoid foods with the statement "May contain milk" and foods that contain: Milk powder. Whey. Curd. Lactose. Lactoglobulin. The items listed above may not be a complete list of foods and beverages to avoid. Contact a dietitian for more information. Where to find more information National Institute of Diabetes and Digestive and Kidney Diseases: www.niddk.nih.gov Summary If you are lactose intolerant, it means that you are not able to digest lactose, a natural sugar found in milk and milk products. Following a lactose-free diet can help you manage this condition. Calcium is important for bone health and is found in many foods that contain lactose. Talk with your health care provider about other sources of calcium. This information is not intended to replace advice given to you by your health care provider. Make sure you discuss any questions you have with your health care provider. Document Revised: 04/25/2020 Document Reviewed: 04/25/2020 Elsevier Patient Education  2023 Elsevier Inc.  

## 2022-09-23 NOTE — Progress Notes (Signed)
[name], MD [address]  [address]  [place], [place] [address]  Main: [phone]  Fax: [phone]    Gastroenterology Consultation  Referring Provider:     [name], FNP Primary Care Physician:  [name], FNP Primary Gastroenterologist:  Dr. [name] Reason for Consultation: Upper abdominal pain, elevated LFTs        HPI:   [name] is a 47 y.o. female referred by [name], FNP  for consultation & management of upper abdominal pain, elevated LFTs.  Patient was admitted to [facility] first week of January secondary to fever, headache, elevated FTs.  She was tested positive for influenza and was given Tamiflu for 5 days.  She did have elevated transaminases and alkaline phosphatase.  Total bilirubin was normal.  She underwent extensive secondary liver disease workup which was all negative.  Imaging revealed fatty liver only.  Serum lipase levels were normal is also been experiencing intermittent early satiety, abdominal bloating and the symptoms are most postprandial.  With subjective fevers, nausea as well.  She denies any black stools or rectal bleeding.  Patient used to drink heavily prior to hospital admission.  She was taking alternating Tylenol and NSAID 2 days prior to hospital admission every 3 hours.  NSAIDs: None  Antiplts/Anticoagulants/Anti thrombotics: None  GI Procedures: None  Past Medical History:  Diagnosis Date   BRCA negative 07/2018   MyRisk neg   Family history of breast cancer    IBIS=13%/riskscore=8.4%   Family history of ovarian cancer    Family history of pancreatic cancer    GERD (gastroesophageal reflux disease)    History of frequent urinary tract infections    Hypoglycemia    IBS (irritable bowel syndrome)    Migraine headache    linger for days when they happen   Vertigo    none for several years    Past Surgical History:  Procedure Laterality Date   ABDOMINAL HYSTERECTOMY  07/2006   PARTIAL    BACK SURGERY     CHOLECYSTECTOMY     DIAGNOSTIC LAPAROSCOPY     test on bladder   ESOPHAGOGASTRODUODENOSCOPY (EGD) WITH PROPOFOL N/A 06/25/2022   Procedure: ESOPHAGOGASTRODUODENOSCOPY (EGD) WITH PROPOFOL;  Surgeon: Toney Reil, MD;  Location: [facility];  Service: Endoscopy;  Laterality: N/A;   GALLBLADDER SURGERY     LAPAROSCOPIC APPENDECTOMY N/A 12/08/2016   Procedure: APPENDECTOMY LAPAROSCOPIC;  Surgeon: Leafy Ro, MD;  Location: [facility];  Service: General;  Laterality: N/A;   MAXIMUM ACCESS (MAS)POSTERIOR LUMBAR INTERBODY FUSION (PLIF) 1 LEVEL N/A 05/06/2013   Procedure: LUMBAR FIVE-SACRAL ONE MAXIMUM ACCESS POSTERIOR LUMBAR INTERBODY FUSION;  Surgeon: Tia Alert, MD;  Location: [facility];  Service: Neurosurgery;  Laterality: N/A;   TOE SURGERY     TUBAL LIGATION     WISDOM TOOTH EXTRACTION       Current Outpatient Medications:    benzonatate (TESSALON) 100 MG capsule, Take 1 capsule (100 mg total) by mouth 3 (three) times daily as needed for cough., Disp: 30 capsule, Rfl: 0   Blood Glucose Monitoring Suppl DEVI, 1 each by Does not apply route 2 (two) times daily as needed. May substitute to any manufacturer covered by patient's insurance., Disp: 1 each, Rfl: 0   Cholecalciferol (VITAMIN D3) 50 MCG (2000 UT) CAPS, Take 1 capsule (2,000 Units total) by mouth daily., Disp: 30 capsule, Rfl: 4   co-enzyme Q-10 30 MG capsule, Take 30 mg by mouth  3 (three) times daily., Disp: , Rfl:    DOTTI 0.0375 MG/24HR, Place 1 patch onto the skin 2 (two) times a week., Disp: , Rfl:    FLUoxetine (PROZAC) 40 MG capsule, Take 1 capsule (40 mg total) by mouth daily., Disp: 90 capsule, Rfl: 3   fluticasone (FLONASE) 50 MCG/ACT nasal spray, Place 2 sprays into both nostrils daily., Disp: 16 g, Rfl: 0   hydrOXYzine (ATARAX) 25 MG tablet, Take 25 mg by mouth every 8 (eight) hours as needed for anxiety., Disp: , Rfl:    Magnesium 400 MG CAPS, Take 400 mg by mouth daily., Disp: , Rfl:     nortriptyline (PAMELOR) 10 MG capsule, Take nortriptyline 10 mg at night for one week then increase to 20 mg at night and continue this dose., Disp: , Rfl:    omeprazole (PRILOSEC) 40 MG capsule, Take 1 capsule (40 mg total) by mouth daily., Disp: 30 capsule, Rfl: 2   phenazopyridine (PYRIDIUM) 100 MG tablet, Take 1 tablet (100 mg total) by mouth 3 (three) times daily as needed for pain., Disp: 10 tablet, Rfl: 0   progesterone (PROMETRIUM) 100 MG capsule, Take 100 mg by mouth at bedtime., Disp: , Rfl:    rosuvastatin (CRESTOR) 10 MG tablet, Take 1 tablet (10 mg total) by mouth daily., Disp: 90 tablet, Rfl: 3   traZODone (DESYREL) 100 MG tablet, Take 1-2 tablets PO as needed to assist with sleep, Disp: 180 tablet, Rfl: 3   valACYclovir (VALTREX) 500 MG tablet, Take 2 tablets (1,000 mg total) by mouth 2 (two) times daily., Disp: 20 tablet, Rfl: 11   Vitamin D, Ergocalciferol, (DRISDOL) 1.25 MG (50000 UNIT) CAPS capsule, Take 1 capsule (50,000 Units total) by mouth every 7 (seven) days., Disp: 12 capsule, Rfl: 0   Family History  Problem Relation Age of Onset   Hypertension Mother    COPD Mother    Epilepsy Mother    Sleep apnea Mother    Hypertension Father    Sleep apnea Father    Breast cancer Maternal Aunt 46   Ovarian cancer Maternal Aunt 45   Colon cancer Paternal Uncle 28   Pancreatic cancer Maternal Grandmother 80   Colon cancer Maternal Grandfather 35     Social History   Tobacco Use   Smoking status: Every Day    Packs/day: 0.75    Years: 30.00    Additional pack years: 0.00    Total pack years: 22.50    Types: Cigarettes    Last attempt to quit: 05/30/2018    Years since quitting: 4.3   Smokeless tobacco: Never   Tobacco comments:    Started smoking around age 89  Vaping Use   Vaping Use: Some days  Substance Use Topics   Alcohol use: Not Currently    Comment: was 40 beers/wk.  None since 06/03/22   Drug use: No    Allergies as of 09/23/2022 - Review Complete  09/23/2022  Allergen Reaction Noted   Codeine Nausea And Vomiting 03/10/2013   Sulfa antibiotics Rash 03/10/2013   Vicodin [hydrocodone-acetaminophen] Nausea Only 03/12/2013    Review of Systems:    All systems reviewed and negative except where noted in HPI.   Physical Exam:  BP 131/88 (BP Location: Left Arm, Patient Position: Sitting, Cuff Size: Normal)   Pulse 79   Temp 99.5 F (37.5 C) (Oral)   Ht 5\' 6"  (1.676 m)   Wt 192 lb 6 oz (87.3 kg)   BMI 31.05 kg/m  No  LMP recorded. Patient has had a hysterectomy.  General:   Alert,  Well-developed, well-nourished, pleasant and cooperative in NAD Head:  Normocephalic and atraumatic. Eyes:  Sclera clear, no icterus.   Conjunctiva pink. Ears:  Normal auditory acuity. Nose:  No deformity, discharge, or lesions. Mouth:  No deformity or lesions,oropharynx pink & moist. Neck:  Supple; no masses or thyromegaly. Lungs:  Respirations even and unlabored.  Clear throughout to auscultation.   No wheezes, crackles, or rhonchi. No acute distress. Heart:  Regular rate and rhythm; no murmurs, clicks, rubs, or gallops. Abdomen:  Normal bowel sounds. Soft, epigastric tenderness and mildly distended, tympanic to percussion without masses, hepatosplenomegaly or hernias noted.  No guarding or rebound tenderness.   Rectal: Not performed Msk:  Symmetrical without gross deformities. Good, equal movement & strength bilaterally. Pulses:  Normal pulses noted. Extremities:  No clubbing or edema.  No cyanosis. Neurologic:  Alert and oriented x3;  grossly normal neurologically. Skin:  Intact without significant lesions or rashes. No jaundice. Psych:  Alert and cooperative. Normal mood and affect.  Imaging Studies: Reviewed  Assessment and Plan:   Alasha Mcguinness is a 47 y.o.  female with history of alcohol use, tobacco use, s/p cholecystectomy recent diagnosis of influenza, treatment with Tamiflu is seen in consultation for more than 2 weeks symptoms of  dyspepsia and elevated LFTs  Dyspepsia Start omeprazole 40 mg daily before breakfast Recommend EGD for further evaluation  Elevated LFTs Recheck LFTs today, if persistently elevated, recommend ultrasound-guided liver biopsy Counseled to remain abstinent from alcohol use Secondary liver disease workup is negative   Follow up based on the above workup   [name], MD

## 2022-09-24 ENCOUNTER — Encounter: Payer: Self-pay | Admitting: Gastroenterology

## 2022-09-27 ENCOUNTER — Ambulatory Visit: Payer: 59 | Admitting: Student in an Organized Health Care Education/Training Program

## 2022-09-27 ENCOUNTER — Encounter: Payer: Self-pay | Admitting: Student in an Organized Health Care Education/Training Program

## 2022-09-27 VITALS — BP 126/74 | HR 80 | Temp 97.9°F | Ht 66.0 in | Wt 195.8 lb

## 2022-09-27 DIAGNOSIS — R053 Chronic cough: Secondary | ICD-10-CM | POA: Diagnosis not present

## 2022-09-27 NOTE — Patient Instructions (Signed)
The [place] [facility]: Call 1-800-QUIT-NOW (1-800-784-8669). The [place] [facility]uit[facility] is a free service for [place] residents. Trained counselors are available from 8 am until 3 am, 365 days per year. Services are available in both English and Spanish.   Web Resources Free online support programs can help you track your progress and share experiences with others who are quitting. These are examples: www.becomeanex.org www.trytostop.org  www.smokefree.gov  www.everydayhealth.com/smoking-cessation/index.aspx  [facility]: offers comprehensive in-person tobacco treatment counseling at [facility] ([address] [place] [address]).  Open to everyone. Virtual appointments available. Free parking. Call [phone] to schedule an appointment or [phone] for general information.    Tobacco Cessation Medications  Nicotine Replacement Therapy (NRT)  Nicotine is the addictive part of tobacco smoke, but not the most dangerous part. There are 7000 other toxins in cigarettes, including carbon monoxide, that cause disease. People do not generally become addicted to medication. Common problems: People don't use enough medication or stop too early. Medications are safe and effective. Overdose is very uncommon. Use medications as long as needed (3 months minimum). Some combinations work better than single medications. Long acting medications like the NRT patch and bupropion provide continuous treatment for withdrawal symptoms.  PLUS  Short acting medications like the NRT gum, lozenge, inhaler, and nasal spray help people to cope with breakthrough cravings.  ? Nicotine Patch  Place patch on hairless skin on upper body, including arms and back. Each day: discard old patch, shower, apply new patch to a different site. Apply hydrocortisone cream to mildly red/irritated areas. Call provider if rash develops. If patch causes sleep disturbance, remove patch  at bedtime and replace each morning after shower. Side effects may include: skin irritation, headache, insomnia, abnormal/vivid dreams.  ? Nicotine Gum  Chew gum slowly, park in cheek when peppery taste or tingling sensation begins (about 15-30 chews). When taste or tingling goes away, begin chewing again. Use until nicotine is gone (taste or tingle does not return, usually 30 minutes). Park in different areas of mouth. Nicotine is absorbed through the lining of the mouth. Use enough to control cravings, up to 24 pieces per day (if used alone). Avoid eating or drinking for 15 minutes before using and during use. Side effects may include: mouth/jaw soreness, hiccups, indigestion, hypersalivation.  If gum is not chewed correctly, additional side effects may include lightheadedness, nausea/vomiting, throat and mouth irritation.  ? Nicotine Lozenge  Allow to dissolve slowly in mouth (20-30 minutes). Do not chew or swallow. Nicotine release may cause a warm tingling sensation. Occasionally rotate to different areas of the mouth. Use enough to control cravings, up to 20 lozenges per day (if used alone). Avoid eating or drinking for 15 minutes before using and during use. Side effects may include: nausea, hiccups, cough, heartburn, headache, gas, insomnia.  ? Nicotine Nasal Spray Use 1 spray in each nostril (1 dose) and tilt head back for 1 minute. Do not sniff, swallow, or inhale through nose.  Use at least 8 doses (1 spray in each nostril) , up to 40 doses per day (if used alone). To reduce nasal irritation, spray on cotton swab and insert into nose. Side effects may include: nasal and/or throat irritation (hot, peppery, or burning sensation), nasal irritation, tearing, sneezing, cough, headache.  ? Nicotine Oral Inhaler (puffer) Inhale into the back of the throat or puff in short breaths. Do not inhale into the lungs.  Puff continuously for 20 minutes (about 80 puffs) until cartridge   is  empty. Change cartridge when it loses the "burning in throat" sensation (feels like air only). Open cartridges can be saved and used again within 24 hours. Use at least 6 and up to 16 cartridges per day (if used alone).  Avoid eating or drinking for 15 minutes before using and during use. Side effects may include: mouth and/or throat irritation, unpleasant taste, cough, nasal irritation, indigestion, hiccups, headache.  ? Chantix (varenicline) Days 1-3: Take one 0.5 mg white pill each morning for 3 days, one week before quit date. Days 4-7: Increase to one 0.5 mg white pill twice a day in morning and evening for 4 days.  On Day 8 (target quit date), increase to one 1 mg blue pill twice a day. Maintain this dose for a minimum of 3 months. Take with food and a full glass of water to reduce nausea. Be sure that the two doses are at least 8 hours apart, but try to take second dose early in the evening (i.e. 6 pm) to avoid sleep problems. Common side effects include: nausea, insomnia, headache, abnormal/vivid dreams. Tell your doctor if you have any history of psychiatric illness prior to starting Chantix.  STOP taking CHANTIX and contact a healthcare provider immediately if you experience agitation, hostility, depressed mood, changes in thoughts or behavior that are not typical for you, thinking about or attempting suicide, allergic or skin reactions including swelling, rash, redness, or peeling of the skin.  For patients who have heart disease: Smoking is a major risk factor for cardiovascular disease, and Chantix can help you quit smoking. Chantix may be associated with a small, increased risk of certain heart events in patients who have heart disease. If you have any new or worsening symptoms of heart disease while taking Chantix, such as shortness of breath or trouble breathing, new or worsening chest pain, or new or worsening pain in your legs when walking, call your doctor or get emergency medical  help immediately.  ? Wellbutrin / Zyban (bupropion) Take one 150 mg pill each morning for 3 days, one week before target quit date. On Day 4, increase to one 150 mg pill twice a day, morning and evening.  Maintain this dose for a minimum of 3 months. Be sure that the two doses are at least 8 hours apart, but try to take second dose early in the evening (i.e. 6 pm) to avoid sleep problems. Avoid or minimize use of alcohol when taking this medication. Common side effects include: dry mouth, headache, insomnia, nausea, weight loss.  Risk of seizure is 06/998. STOP taking BUPROPION and contact a healthcare provider immediately if you experience agitation, hostility, depressed mood, changes in thoughts or behavior that are not typical for you, thinking about or attempting suicide, allergic or skin reactions including swelling, rash, redness, or peeling of the skin.  

## 2022-09-27 NOTE — Progress Notes (Signed)
Synopsis: Referred in for chronic cough by [name], FNP  Assessment & Plan:   1. Chronic cough  She is presenting for chronic cough of a few months in duration. She did have Influenza but she should be outside the window for a post viral cough. Cough variant asthma is high on my differential given her strong family history, and she'll require a pulmonary function test to better evaluate spirometry. I will hold off on starting empiric asthma therapy given there is no wheeze on exam today. Furthermore, I'll obtain a double contrast esophagogram to evaluate for reflux disease. I have asked her to continue flonase, and use it twice daily, and continue using her zyrtec as she is.  - Pulmonary Function Test [facility] Only; Future - DG ESOPHAGUS W DOUBLE CM (HD); Future   Return in about 1 month (around 10/27/2022).  I spent 60 minutes caring for this patient today, including preparing to see the patient, obtaining a medical history , reviewing a separately obtained history, performing a medically appropriate examination and/or evaluation, counseling and educating the patient/family/caregiver, ordering medications, tests, or procedures, and documenting clinical information in the electronic health record  [name], MD [facility] 09/27/2022 9:28 AM    End of visit medications:  No orders of the defined types were placed in this encounter.    Current Outpatient Medications:    benzonatate (TESSALON) 100 MG capsule, Take 1 capsule (100 mg total) by mouth 3 (three) times daily as needed for cough., Disp: 30 capsule, Rfl: 0   Blood Glucose Monitoring Suppl DEVI, 1 each by Does not apply route 2 (two) times daily as needed. May substitute to any manufacturer covered by patient's insurance., Disp: 1 each, Rfl: 0   Cholecalciferol (VITAMIN D3) 50 MCG (2000 UT) CAPS, Take 1 capsule (2,000 Units total) by mouth daily., Disp: 30 capsule, Rfl: 4   co-enzyme Q-10 30 MG  capsule, Take 30 mg by mouth 3 (three) times daily., Disp: , Rfl:    DOTTI 0.0375 MG/24HR, Place 1 patch onto the skin 2 (two) times a week., Disp: , Rfl:    FLUoxetine (PROZAC) 40 MG capsule, Take 1 capsule (40 mg total) by mouth daily., Disp: 90 capsule, Rfl: 3   fluticasone (FLONASE) 50 MCG/ACT nasal spray, Place 2 sprays into both nostrils daily., Disp: 16 g, Rfl: 0   hydrOXYzine (ATARAX) 25 MG tablet, Take 25 mg by mouth every 8 (eight) hours as needed for anxiety., Disp: , Rfl:    Magnesium 400 MG CAPS, Take 400 mg by mouth daily., Disp: , Rfl:    nortriptyline (PAMELOR) 10 MG capsule, Take nortriptyline 10 mg at night for one week then increase to 20 mg at night and continue this dose., Disp: , Rfl:    omeprazole (PRILOSEC) 40 MG capsule, Take 1 capsule (40 mg total) by mouth daily., Disp: 30 capsule, Rfl: 2   phenazopyridine (PYRIDIUM) 100 MG tablet, Take 1 tablet (100 mg total) by mouth 3 (three) times daily as needed for pain., Disp: 10 tablet, Rfl: 0   progesterone (PROMETRIUM) 100 MG capsule, Take 100 mg by mouth at bedtime., Disp: , Rfl:    promethazine (PHENERGAN) 12.5 MG tablet, Take 1 tablet (12.5 mg total) by mouth every 8 (eight) hours as needed for nausea or vomiting., Disp: 30 tablet, Rfl: 0   rosuvastatin (CRESTOR) 10 MG tablet, Take 1 tablet (10 mg total) by mouth daily., Disp: 90 tablet, Rfl: 3   traZODone (DESYREL) 100 MG  tablet, Take 1-2 tablets PO as needed to assist with sleep, Disp: 180 tablet, Rfl: 3   valACYclovir (VALTREX) 500 MG tablet, Take 2 tablets (1,000 mg total) by mouth 2 (two) times daily., Disp: 20 tablet, Rfl: 11   Vitamin D, Ergocalciferol, (DRISDOL) 1.25 MG (50000 UNIT) CAPS capsule, Take 1 capsule (50,000 Units total) by mouth every 7 (seven) days. (Patient not taking: Reported on 09/27/2022), Disp: 12 capsule, Rfl: 0   Subjective:   PATIENT ID: [name] GENDER: female DOB: 1976-04-20, MRN: [id number]  Chief Complaint  Patient presents with    Pulmonary consult    Flu 06/2022-lingering prod cough with brown sputum and SOB with exertion.     HPI  [name] is a pleasant 47 year old female presenting to clinic for the evaluation of cough.  She reports a cough that has been ongoing for a few months now. This is not associated with any chest pain, tightness, wheezing, pleurisy, or shortness of breath. She does report that is at times productive of brownish sputum, but denies any hemoptysis. She does report a runny nose and has a scratchy sensation in the back of her throat. She feels the cough got worse as she gained some weight. She is using claritin and flonase for this.  Patient reports living in the same house for a long time, with no recent change to her living situation. The house is old and might have mold, but this is not new. She reports history of bronchitis as a child, but reports outgrowing it. Reports strong family history of asthma in her children as well as her parents. She does not carry a personal history of asthma.  Patient was admitted to the hospital January of 2024 for headache where she underwent a lumbar puncture that did not suggest any infectious etiology. She was diagnosed with Influenza A and was also noted to be positive for CMV. She's been followed by Dr. Allegra Lai from gastroenterology for fatty liver disease and reflux disease, treated with omeprazole.  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 She is an active smoker, currently smoking 15 cigarettes per day. She has around 20 to 25 pack years of smoking history.  Ancillary information including prior medications, full medical/surgical/family/social histories, and PFTs (when available) are listed below and have been reviewed.   Review of Systems  Constitutional:  Negative for chills, fever, malaise/fatigue and weight loss.  Respiratory:  Positive for cough and sputum production. Negative for hemoptysis, shortness of breath and  wheezing.   Cardiovascular:  Negative for chest pain, palpitations, leg swelling and PND.     Objective:   Vitals:   09/27/22 0903  BP: 126/74  Pulse: 80  Temp: 97.9 F (36.6 C)  TempSrc: Temporal  SpO2: 99%  Weight: 195 lb 12.8 oz (88.8 kg)  Height: 5\' 6"  (1.676 m)   99% on RA  BMI Readings from Last 3 Encounters:  09/27/22 31.60 kg/m  09/23/22 31.05 kg/m  09/10/22 31.51 kg/m   Wt Readings from Last 3 Encounters:  09/27/22 195 lb 12.8 oz (88.8 kg)  09/23/22 192 lb 6 oz (87.3 kg)  09/10/22 195 lb 3.2 oz (88.5 kg)    Physical Exam Constitutional:      Appearance: Normal appearance. She is obese. She is not ill-appearing.  Cardiovascular:     Rate and Rhythm: Normal rate.     Pulses: Normal pulses.     Heart sounds: Normal heart sounds.  Pulmonary:     Effort:  Pulmonary effort is normal. No respiratory distress.     Breath sounds: Normal breath sounds. No stridor. No wheezing, rhonchi or rales.  Abdominal:     Palpations: Abdomen is soft.  Musculoskeletal:     Right lower leg: No edema.     Left lower leg: No edema.  Neurological:     General: No focal deficit present.     Mental Status: She is alert and oriented to person, place, and time. Mental status is at baseline.     Ancillary Information    Past Medical History:  Diagnosis Date   BRCA negative 07/2018   MyRisk neg   Family history of breast cancer    IBIS=13%/riskscore=8.4%   Family history of ovarian cancer    Family history of pancreatic cancer    GERD (gastroesophageal reflux disease)    History of frequent urinary tract infections    Hypoglycemia    IBS (irritable bowel syndrome)    Migraine headache    linger for days when they happen   Vertigo    none for several years     Family History  Problem Relation Age of Onset   Hypertension Mother    COPD Mother    Epilepsy Mother    Sleep apnea Mother    Hypertension Father    Sleep apnea Father    Breast cancer Maternal Aunt 83    Ovarian cancer Maternal Aunt 45   Colon cancer Paternal Uncle 42   Pancreatic cancer Maternal Grandmother 56   Colon cancer Maternal Grandfather 63     Past Surgical History:  Procedure Laterality Date   ABDOMINAL HYSTERECTOMY  07/2006   PARTIAL   BACK SURGERY     CHOLECYSTECTOMY     DIAGNOSTIC LAPAROSCOPY     test on bladder   ESOPHAGOGASTRODUODENOSCOPY (EGD) WITH PROPOFOL N/A 06/25/2022   Procedure: ESOPHAGOGASTRODUODENOSCOPY (EGD) WITH PROPOFOL;  Surgeon: Toney Reil, MD;  Location: [facility];  Service: Endoscopy;  Laterality: N/A;   GALLBLADDER SURGERY     LAPAROSCOPIC APPENDECTOMY N/A 12/08/2016   Procedure: APPENDECTOMY LAPAROSCOPIC;  Surgeon: Leafy Ro, MD;  Location: [facility];  Service: General;  Laterality: N/A;   MAXIMUM ACCESS (MAS)POSTERIOR LUMBAR INTERBODY FUSION (PLIF) 1 LEVEL N/A 05/06/2013   Procedure: LUMBAR FIVE-SACRAL ONE MAXIMUM ACCESS POSTERIOR LUMBAR INTERBODY FUSION;  Surgeon: Tia Alert, MD;  Location: [facility];  Service: Neurosurgery;  Laterality: N/A;   TOE SURGERY     TUBAL LIGATION     WISDOM TOOTH EXTRACTION      Social History   Socioeconomic History   Marital status: Legally Separated    Spouse name: Not on file   Number of children: Not on file   Years of education: Not on file   Highest education level: Not on file  Occupational History   Not on file  Tobacco Use   Smoking status: Every Day    Packs/day: 0.75    Years: 30.00    Additional pack years: 0.00    Total pack years: 22.50    Types: Cigarettes    Last attempt to quit: 05/30/2018    Years since quitting: 4.3   Smokeless tobacco: Never   Tobacco comments:    15 cigarettes daily 09/27/2022  Vaping Use   Vaping Use: Some days  Substance and Sexual Activity   Alcohol use: Not Currently    Comment: was 40 beers/wk.  None since 06/03/22   Drug use: No   Sexual activity: Yes  Partners: Female, Female    Birth control/protection: Surgical    Comment:  Hysterectomy  Other Topics Concern   Not on file  Social History Narrative   Not on file   Social Determinants of Health   Financial Resource Strain: High Risk (09/18/2021)   Overall Financial Resource Strain (CARDIA)    Difficulty of Paying Living Expenses: Hard  Food Insecurity: No Food Insecurity (06/07/2022)   Hunger Vital Sign    Worried About Running Out of Food in the Last Year: Never true    Ran Out of Food in the Last Year: Never true  Transportation Needs: No Transportation Needs (06/07/2022)   PRAPARE - Administrator, Civil Service (Medical): No    Lack of Transportation (Non-Medical): No  Physical Activity: Not on file  Stress: Not on file  Social Connections: Socially Isolated (09/18/2021)   Social Connection and Isolation Panel [NHANES]    Frequency of Communication with Friends and Family: More than three times a week    Frequency of Social Gatherings with Friends and Family: Three times a week    Attends Religious Services: Never    Active Member of Clubs or Organizations: No    Attends Banker Meetings: Never    Marital Status: Separated  Intimate Partner Violence: Not At Risk (06/07/2022)   Humiliation, Afraid, Rape, and Kick questionnaire    Fear of Current or Ex-Partner: No    Emotionally Abused: No    Physically Abused: No    Sexually Abused: No     Allergies  Allergen Reactions   Codeine Nausea And Vomiting   Sulfa Antibiotics Rash   Vicodin [Hydrocodone-Acetaminophen] Nausea Only    And itching all over     CBC    Component Value Date/Time   WBC 5.4 09/10/2022 0835   WBC 8.2 07/04/2022 0758   RBC 4.50 09/10/2022 0835   RBC 4.16 07/04/2022 0758   HGB 13.6 09/10/2022 0835   HCT 39.8 09/10/2022 0835   PLT 256 09/10/2022 0835   MCV 88 09/10/2022 0835   MCV 91 05/14/2014 2239   MCH 30.2 09/10/2022 0835   MCH 30.3 07/04/2022 0758   MCHC 34.2 09/10/2022 0835   MCHC 33.8 07/04/2022 0758   RDW 12.5 09/10/2022 0835   RDW 12.7  05/14/2014 2239   LYMPHSABS 6.8 (H) 06/21/2022 1041   LYMPHSABS 1.4 10/23/2013 1022   MONOABS 0.5 06/06/2022 1334   MONOABS 0.7 10/23/2013 1022   EOSABS 0.3 06/21/2022 1041   EOSABS 0.1 10/23/2013 1022   BASOSABS 0.1 06/21/2022 1041   BASOSABS 0.1 10/23/2013 1022    Pulmonary Functions Testing Results:     No data to display          Outpatient Medications Prior to Visit  Medication Sig Dispense Refill   benzonatate (TESSALON) 100 MG capsule Take 1 capsule (100 mg total) by mouth 3 (three) times daily as needed for cough. 30 capsule 0   Blood Glucose Monitoring Suppl DEVI 1 each by Does not apply route 2 (two) times daily as needed. May substitute to any manufacturer covered by patient's insurance. 1 each 0   Cholecalciferol (VITAMIN D3) 50 MCG (2000 UT) CAPS Take 1 capsule (2,000 Units total) by mouth daily. 30 capsule 4   co-enzyme Q-10 30 MG capsule Take 30 mg by mouth 3 (three) times daily.     DOTTI 0.0375 MG/24HR Place 1 patch onto the skin 2 (two) times a week.     FLUoxetine (PROZAC) 40  MG capsule Take 1 capsule (40 mg total) by mouth daily. 90 capsule 3   fluticasone (FLONASE) 50 MCG/ACT nasal spray Place 2 sprays into both nostrils daily. 16 g 0   hydrOXYzine (ATARAX) 25 MG tablet Take 25 mg by mouth every 8 (eight) hours as needed for anxiety.     Magnesium 400 MG CAPS Take 400 mg by mouth daily.     nortriptyline (PAMELOR) 10 MG capsule Take nortriptyline 10 mg at night for one week then increase to 20 mg at night and continue this dose.     omeprazole (PRILOSEC) 40 MG capsule Take 1 capsule (40 mg total) by mouth daily. 30 capsule 2   phenazopyridine (PYRIDIUM) 100 MG tablet Take 1 tablet (100 mg total) by mouth 3 (three) times daily as needed for pain. 10 tablet 0   progesterone (PROMETRIUM) 100 MG capsule Take 100 mg by mouth at bedtime.     promethazine (PHENERGAN) 12.5 MG tablet Take 1 tablet (12.5 mg total) by mouth every 8 (eight) hours as needed for nausea or  vomiting. 30 tablet 0   rosuvastatin (CRESTOR) 10 MG tablet Take 1 tablet (10 mg total) by mouth daily. 90 tablet 3   traZODone (DESYREL) 100 MG tablet Take 1-2 tablets PO as needed to assist with sleep 180 tablet 3   valACYclovir (VALTREX) 500 MG tablet Take 2 tablets (1,000 mg total) by mouth 2 (two) times daily. 20 tablet 11   Vitamin D, Ergocalciferol, (DRISDOL) 1.25 MG (50000 UNIT) CAPS capsule Take 1 capsule (50,000 Units total) by mouth every 7 (seven) days. (Patient not taking: Reported on 09/27/2022) 12 capsule 0   No facility-administered medications prior to visit.

## 2022-09-30 ENCOUNTER — Encounter: Payer: Self-pay | Admitting: Dietician

## 2022-09-30 ENCOUNTER — Encounter: Payer: 59 | Attending: Family Medicine | Admitting: Dietician

## 2022-09-30 VITALS — Ht 66.0 in | Wt 194.4 lb

## 2022-09-30 DIAGNOSIS — Z713 Dietary counseling and surveillance: Secondary | ICD-10-CM | POA: Diagnosis present

## 2022-09-30 DIAGNOSIS — E669 Obesity, unspecified: Secondary | ICD-10-CM

## 2022-09-30 DIAGNOSIS — E785 Hyperlipidemia, unspecified: Secondary | ICD-10-CM

## 2022-09-30 DIAGNOSIS — E162 Hypoglycemia, unspecified: Secondary | ICD-10-CM | POA: Insufficient documentation

## 2022-09-30 NOTE — Patient Instructions (Signed)
Continue to gradually decrease beer intake.  Start some light exercise with gardening, walks, etc.  Control portions of starches to 1 cup (fist-size) or less. Choose some whole grains. Emphasize vegetables in meals with large portions, make the veggies the main part of the meal and treat starches and meats as sides.

## 2022-09-30 NOTE — Progress Notes (Signed)
Medical Nutrition Therapy: Visit start time: 1730  end time: 1830  Assessment:   Referral Diagnosis: hypoglycemia Other medical history/ diagnoses: fatty liver disease, hyperlipidemia, GERD, obesity Psychosocial issues/ stress concerns: none  Medications, supplements: reconciled list in medical record   Preferred learning method:  Auditory Hands-on    Current weight: 194.4lbs Height: 5'6" BMI: 31.38 Patient's personal weight goal: 155lbs  Progress and evaluation:  Patient reports significant life event(s) several years ago, which led her to increase alcohol intake. She reports at one point drinking over 12 beers daily. Weight increased rapidly. She is now decreasing beer consumption and is currently down to 2-6 per day. She recently began low carb diet to lose weight and improve cholesterol and liver health, but then began to have hypoglycemia. She has resumed some carb intake and has not had recent hypoglycemic episodes. Patient shares meals with boyfriend who prefers large meals with multiple foods Recent labs 09/10/22: total cholesterol 172, triglycerides 321, HDL 82, LDL 43; 06/21/22 total cholesterol 220, triglycerides 524, HDL 28, LDL 103 liver function panel 06/12/22: AST 108, ALT 178, Alk phos 279; protein low@ 6.2, albumin low @ 3.0. fatty liver was seen on imaging results. Food allergies: none  Special diet practices: none Patient seeks help with improving diet to reduce health risk and lose weight    Dietary Intake:  Usual eating pattern includes 3 meals and 0-1 snacks per day. Dining out frequency: 9 meals per week. Who plans meals/ buys groceries? self Who prepares meals? self  Breakfast: biscuit with gilled tenderloin, tomato, cheese, or sausage and cheese, mushroom coffee ( pt read that it's good for gut health) Snack: none Lunch: out 2x a week meat + 2 veg; leftovers Snack: occ grapes Supper: spaghetti and salad; steak; grilled salmon + starch + veg Snack:  none Beverages: water with circul bottle, decreasing beers, diet dr pepper 2x a day, occ 1/2-sweet tea  Physical activity: no regular activity, works sedentary job   Intervention:   Nutrition Care Education:   Basic nutrition: appropriate nutrient balance; appropriate meal and snack schedule; general nutrition guidelines    Weight control: importance of low sugar and low fat choices; appropriate food portions; estimated energy needs for weight loss at 1500 kcal, provided guidance for 45% CHO, 25% pro, and 30% fat; role of alcohol, role of physical activity hypoglycemia:  appropriate meal and snack schedule; appropriate carb intake and balance, healthy carb choices; role of fiber, protein Hyperlipidemia: healthy and unhealthy fats; role of fiber, plant sterols; role of exercise Fatty liver:  ongoing reduction-avoidance of alcohol; controlling carb intake and choosing unrefined carbs, emphasizing whole foods, veg/ fruit, beans, healthy proteins; instructed on Mediterranean eating pattern for overall health benefit  Other intervention notes: Patient has been working on positive diet changes and is motivated to continue. Established additional goals with direction from patient No follow up scheduled at this time; patient will schedule later as needed.   Nutritional Diagnosis:  [place]-2.1 Inpaired nutrition utilization and [place]-2.2 Altered nutrition-related laboratory As related to hyperlipidemia, fatty liver, history of hypoglycemia.  As evidenced by elevated triglycerides, heavy alcohol intake and abnormal LFTs, history of low blood glucose symptoms when following low carb diet. [place]-3.3 Overweight/obesity and [place]-3.4 Unintentional weight gain As related to alcohol intake, excess calories, inadequate physical activity.  As evidenced by patient with current BMI of 31.38 .   Education Materials given:  Get Healthy with Mediterranean Style Eating Plate Planner with food lists, sample meal  pattern Sample menus Visit summary  with goals/ instructions   Learner/ who was taught:  Patient   Level of understanding: Verbalizes/ demonstrates competency  Demonstrated degree of understanding via:   Teach back Learning barriers: None  Willingness to learn/ readiness for change: Eager, change in progress   Monitoring and Evaluation:  Dietary intake, exercise, blood lipids, blood sugar control, liver health, and body weight      follow up: prn

## 2022-10-02 ENCOUNTER — Telehealth: Payer: Self-pay | Admitting: Gastroenterology

## 2022-10-02 ENCOUNTER — Telehealth: Payer: Self-pay

## 2022-10-02 LAB — PANCREATIC ELASTASE, FECAL: Pancreatic Elastase, Fecal: <50 ug Elast./g — ABNORMAL LOW (ref >200)

## 2022-10-02 MED ORDER — PANCRELIPASE (LIP-PROT-AMYL) 36000-114000 UNITS PO CPEP: ORAL_CAPSULE | ORAL | 3 refills | Status: DC

## 2022-10-02 NOTE — Telephone Encounter (Signed)
Patient verbalized understanding of results and made follow up appointment. She states she will keep the colonoscopy as schedule. She states how do you not know that she does not have cancer of her pancrease causing this issue?

## 2022-10-02 NOTE — Telephone Encounter (Signed)
Pt left vm message would like a call back to go over results please return call

## 2022-10-02 NOTE — Telephone Encounter (Signed)
She had CT scan of her abdomen with contrast in 06/2022 which revealed normal pancreas She had CT scan of her abdomen in 12/2021 which also showed normal-appearing pancreas She does not have pancreatic cancer  Pancreatic insufficiency secondary to chronic tobacco use  RV

## 2022-10-02 NOTE — Telephone Encounter (Signed)
Patient is calling for the results from her pancreatic elastase levels. Please advise

## 2022-10-02 NOTE — Telephone Encounter (Signed)
-----   Message from Toney Reil, MD sent at 10/02/2022  3:54 PM EDT ----- Please inform patient that based on the stool test, she has pancreatic insufficiency which could explain her symptoms of diarrhea.  Recommend trial of pancreatic enzymes Creon or Zenpep 2 capsules with first bite of each meal and 1 with snack.  I still recommend to undergo colonoscopy as scheduled  Recommend follow-up in 3 to 4 months  Rohini Vanga

## 2022-10-03 NOTE — Telephone Encounter (Signed)
Patient verbalized understanding of the results  

## 2022-10-03 NOTE — Telephone Encounter (Signed)
Called and left a message for call back  

## 2022-10-04 ENCOUNTER — Ambulatory Visit
Admission: RE | Admit: 2022-10-04 | Discharge: 2022-10-04 | Disposition: A | Discharge status: Home / Self Care | Payer: 59 | Source: Ambulatory Visit | Attending: Student in an Organized Health Care Education/Training Program | Admitting: Student in an Organized Health Care Education/Training Program

## 2022-10-04 DIAGNOSIS — R053 Chronic cough: Secondary | ICD-10-CM | POA: Insufficient documentation

## 2022-10-07 ENCOUNTER — Telehealth: Payer: Self-pay

## 2022-10-07 NOTE — Telephone Encounter (Signed)
Patient states she will come in the morning for the lab work

## 2022-10-07 NOTE — Telephone Encounter (Signed)
-----   Message from Denman George, CMA sent at 07/24/2022  1:11 PM EST ----- H pylori breath test 2 months after treatment is completed

## 2022-10-09 ENCOUNTER — Telehealth: Payer: Self-pay | Admitting: Gastroenterology

## 2022-10-09 NOTE — Telephone Encounter (Signed)
Patient verbalized understanding  

## 2022-10-09 NOTE — Telephone Encounter (Signed)
Pt lvmm in ref to  medication for weightloss  has question please return call

## 2022-10-09 NOTE — Telephone Encounter (Signed)
It is okay to try GLP-1 in setting of pancreatic insufficiency  RV

## 2022-10-09 NOTE — Telephone Encounter (Signed)
Patient states she goes and sees [employer] and they have her on estrogen and progestogen. She states she is over weight and wants to put her on a Ghp 1 injection but wants to make sure it is okay with her pancreatic insufficiency. Please advise if this is okay

## 2022-10-10 LAB — H. PYLORI BREATH TEST: H pylori Breath Test: NEGATIVE

## 2022-10-10 LAB — VITAMIN D 25 HYDROXY (VIT D DEFICIENCY, FRACTURES): Vit D, 25-Hydroxy: 39.2 ng/mL (ref 30.0–100.0)

## 2022-10-13 ENCOUNTER — Other Ambulatory Visit: Payer: Self-pay | Admitting: Gastroenterology

## 2022-10-14 ENCOUNTER — Other Ambulatory Visit: Payer: Self-pay | Admitting: Gastroenterology

## 2022-10-14 NOTE — Telephone Encounter (Signed)
Had vitamin D check on 10/08/2022 and it was normal

## 2022-10-14 NOTE — Telephone Encounter (Signed)
Last refill Historical medication  Last office visit 09/23/2022 Diarrhea

## 2022-10-21 ENCOUNTER — Telehealth: Payer: Self-pay | Admitting: Gastroenterology

## 2022-10-21 NOTE — Telephone Encounter (Signed)
Hold creon and please advice miralax prep clean out  RV

## 2022-10-21 NOTE — Telephone Encounter (Signed)
Pt left message has not had  bowel movement in two weeks please return call

## 2022-10-21 NOTE — Telephone Encounter (Signed)
Patient states she has not had a bowel movement since she started taking the creon and she is miserable. She states she read online she states it said she is not aloud to take a laxative. She states she took a enema but it did not work.

## 2022-10-21 NOTE — Telephone Encounter (Signed)
Patient verbalized understanding of instructions. She will go get this now and will call us back on Wednesday to let us know how she is doing

## 2022-10-24 ENCOUNTER — Ambulatory Visit: Payer: 59 | Attending: Student in an Organized Health Care Education/Training Program

## 2022-10-30 DIAGNOSIS — K760 Fatty (change of) liver, not elsewhere classified: Secondary | ICD-10-CM | POA: Insufficient documentation

## 2022-10-31 ENCOUNTER — Encounter: Payer: Self-pay | Admitting: Gastroenterology

## 2022-10-31 ENCOUNTER — Other Ambulatory Visit: Payer: Self-pay | Admitting: Gastroenterology

## 2022-10-31 DIAGNOSIS — R1013 Epigastric pain: Secondary | ICD-10-CM

## 2022-10-31 NOTE — Telephone Encounter (Signed)
Last office visit 09/23/2022 Diarrhea  Last refill 07/23/2022 30 2 refills  Has appointment 01/22/2023

## 2022-11-01 ENCOUNTER — Ambulatory Visit: Payer: 59 | Admitting: Anesthesiology

## 2022-11-01 ENCOUNTER — Encounter
Admission: RE | Disposition: A | Discharge status: Home / Self Care | Payer: Self-pay | Source: Home / Self Care | Attending: Gastroenterology

## 2022-11-01 ENCOUNTER — Ambulatory Visit
Admission: RE | Admit: 2022-11-01 | Discharge: 2022-11-01 | Disposition: A | Discharge status: Home / Self Care | Payer: 59 | Attending: Gastroenterology | Admitting: Gastroenterology

## 2022-11-01 ENCOUNTER — Encounter: Payer: Self-pay | Admitting: Gastroenterology

## 2022-11-01 DIAGNOSIS — K589 Irritable bowel syndrome without diarrhea: Secondary | ICD-10-CM | POA: Diagnosis not present

## 2022-11-01 DIAGNOSIS — D123 Benign neoplasm of transverse colon: Secondary | ICD-10-CM | POA: Diagnosis not present

## 2022-11-01 DIAGNOSIS — F17200 Nicotine dependence, unspecified, uncomplicated: Secondary | ICD-10-CM | POA: Diagnosis not present

## 2022-11-01 DIAGNOSIS — K644 Residual hemorrhoidal skin tags: Secondary | ICD-10-CM | POA: Insufficient documentation

## 2022-11-01 DIAGNOSIS — Z1211 Encounter for screening for malignant neoplasm of colon: Secondary | ICD-10-CM | POA: Diagnosis present

## 2022-11-01 DIAGNOSIS — K219 Gastro-esophageal reflux disease without esophagitis: Secondary | ICD-10-CM | POA: Diagnosis not present

## 2022-11-01 DIAGNOSIS — F32A Depression, unspecified: Secondary | ICD-10-CM | POA: Insufficient documentation

## 2022-11-01 HISTORY — PX: COLONOSCOPY WITH PROPOFOL: SHX5780

## 2022-11-01 SURGERY — COLONOSCOPY WITH PROPOFOL
Anesthesia: General

## 2022-11-01 MED ORDER — PROPOFOL 10 MG/ML IV BOLUS
INTRAVENOUS | Status: AC
  Filled 2022-11-01: qty 20

## 2022-11-01 MED ORDER — DEXMEDETOMIDINE HCL IN NACL 80 MCG/20ML IV SOLN
INTRAVENOUS | Status: AC
  Filled 2022-11-01: qty 20

## 2022-11-01 MED ORDER — DEXMEDETOMIDINE HCL IN NACL 80 MCG/20ML IV SOLN
INTRAVENOUS | Status: DC | PRN
  Administered 2022-11-01: 10 ug via INTRAVENOUS
  Administered 2022-11-01 (×2): 8 ug via INTRAVENOUS

## 2022-11-01 MED ORDER — LIDOCAINE HCL (CARDIAC) PF 100 MG/5ML IV SOSY
PREFILLED_SYRINGE | INTRAVENOUS | Status: DC | PRN
  Administered 2022-11-01: 40 mg via INTRAVENOUS

## 2022-11-01 MED ORDER — LIDOCAINE HCL (PF) 2 % IJ SOLN
INTRAMUSCULAR | Status: AC
  Filled 2022-11-01: qty 5

## 2022-11-01 MED ORDER — PROPOFOL 500 MG/50ML IV EMUL
INTRAVENOUS | Status: DC | PRN
  Administered 2022-11-01: 100 ug/kg/min via INTRAVENOUS

## 2022-11-01 MED ORDER — PROPOFOL 10 MG/ML IV BOLUS
INTRAVENOUS | Status: DC | PRN
  Administered 2022-11-01: 20 mg via INTRAVENOUS
  Administered 2022-11-01: 50 mg via INTRAVENOUS
  Administered 2022-11-01 (×2): 40 mg via INTRAVENOUS
  Administered 2022-11-01: 50 mg via INTRAVENOUS

## 2022-11-01 MED ORDER — SODIUM CHLORIDE 0.9 % IV SOLN: INTRAVENOUS | Status: DC

## 2022-11-01 NOTE — Anesthesia Preprocedure Evaluation (Signed)
Anesthesia Evaluation  Patient identified by MRN, date of birth, ID band Patient awake    Reviewed: Allergy & Precautions, NPO status , Patient's Chart, lab work & pertinent test results  Airway Mallampati: II  TM Distance: >3 FB Neck ROM: Full    Dental  (+) Teeth Intact   Pulmonary neg pulmonary ROS, Current Smoker and Patient abstained from smoking.   Pulmonary exam normal breath sounds clear to auscultation       Cardiovascular Exercise Tolerance: Good negative cardio ROS Normal cardiovascular exam Rhythm:Regular Rate:Normal     Neuro/Psych  Headaches   Depression    negative neurological ROS  negative psych ROS   GI/Hepatic negative GI ROS, Neg liver ROS,GERD  Medicated,,  Endo/Other  negative endocrine ROS    Renal/GU negative Renal ROS  negative genitourinary   Musculoskeletal negative musculoskeletal ROS (+)    Abdominal Normal abdominal exam  (+)   Peds negative pediatric ROS (+)  Hematology negative hematology ROS (+)   Anesthesia Other Findings Past Medical History: 07/2018: BRCA negative     Comment:  MyRisk neg No date: Family history of breast cancer     Comment:  IBIS=13%/riskscore=8.4% No date: Family history of ovarian cancer No date: Family history of pancreatic cancer No date: GERD (gastroesophageal reflux disease) No date: History of frequent urinary tract infections No date: Hypoglycemia No date: IBS (irritable bowel syndrome) No date: Migraine headache     Comment:  linger for days when they happen No date: Vertigo     Comment:  none for several years  Past Surgical History: 07/2006: ABDOMINAL HYSTERECTOMY     Comment:  PARTIAL No date: APPENDECTOMY No date: BACK SURGERY No date: CHOLECYSTECTOMY No date: DIAGNOSTIC LAPAROSCOPY     Comment:  test on bladder 06/25/2022: ESOPHAGOGASTRODUODENOSCOPY (EGD) WITH PROPOFOL; N/A     Comment:  Procedure: ESOPHAGOGASTRODUODENOSCOPY (EGD)  WITH               PROPOFOL;  Surgeon: Toney Reil, MD;  Location:               [facility];  Service: Endoscopy;  Laterality:               N/A; No date: GALLBLADDER SURGERY 12/08/2016: LAPAROSCOPIC APPENDECTOMY; N/A     Comment:  Procedure: APPENDECTOMY LAPAROSCOPIC;  Surgeon: Leafy Ro, MD;  Location: [facility];  Service: General;                Laterality: N/A; 05/06/2013: MAXIMUM ACCESS (MAS)POSTERIOR LUMBAR INTERBODY FUSION  (PLIF) 1 LEVEL; N/A     Comment:  Procedure: LUMBAR FIVE-SACRAL ONE MAXIMUM ACCESS               POSTERIOR LUMBAR INTERBODY FUSION;  Surgeon: Tia Alert, MD;  Location: [facility];  Service:               Neurosurgery;  Laterality: N/A; No date: TOE SURGERY No date: TUBAL LIGATION No date: WISDOM TOOTH EXTRACTION  BMI    Body Mass Index: 31.25 kg/m      Reproductive/Obstetrics negative OB ROS                             Anesthesia Physical Anesthesia Plan  ASA: 2  Anesthesia Plan:  General   Post-op Pain Management:    Induction: Intravenous  PONV Risk Score and Plan: Propofol infusion and TIVA  Airway Management Planned: Natural Airway  Additional Equipment:   Intra-op Plan:   Post-operative Plan:   Informed Consent: I have reviewed the patients History and Physical, chart, labs and discussed the procedure including the risks, benefits and alternatives for the proposed anesthesia with the patient or authorized representative who has indicated his/her understanding and acceptance.     Dental Advisory Given  Plan Discussed with: CRNA and Surgeon  Anesthesia Plan Comments:        Anesthesia Quick Evaluation

## 2022-11-01 NOTE — Anesthesia Procedure Notes (Signed)
Procedure Name: MAC Date/Time: 11/01/2022 10:16 AM  Performed by: Lily Lovings, CRNAPre-anesthesia Checklist: Patient identified, Emergency Drugs available, Suction available, Patient being monitored and Timeout performed Patient Re-evaluated:Patient Re-evaluated prior to induction Oxygen Delivery Method: Nasal cannula Preoxygenation: Pre-oxygenation with 100% oxygen Induction Type: IV induction

## 2022-11-01 NOTE — Op Note (Signed)
[facility] Patient Name: [name] Procedure Date: 11/01/2022 10:03 AM MRN: [id number] Account #: [id number] Date of Birth: [birth year] Admit Type: Outpatient Age: 47 Room: [facility] ENDO ROOM 2 Gender: Female Note Status: Finalized Instrument Name: Prentice Docker [id number] Procedure:             Colonoscopy Indications:           Screening for colorectal malignant neoplasm, Last                         colonoscopy: October 2010 Providers:             Toney Reil MD, MD Referring MD:          Daryl Eastern. Suzie Portela (Referring MD) Medicines:             General Anesthesia Complications:         No immediate complications. Estimated blood loss: None. Procedure:             Pre-Anesthesia Assessment:                        - Prior to the procedure, a History and Physical was                         performed, and patient medications and allergies were                         reviewed. The patient is competent. The risks and                         benefits of the procedure and the sedation options and                         risks were discussed with the patient. All questions                         were answered and informed consent was obtained.                         Patient identification and proposed procedure were                         verified by the physician, the nurse, the                         anesthesiologist, the anesthetist and the technician                         in the pre-procedure area in the procedure room in the                         endoscopy suite. Mental Status Examination: alert and                         oriented. Airway Examination: normal oropharyngeal                         airway and neck mobility. Respiratory Examination:  clear to auscultation. CV Examination: normal.                         Prophylactic Antibiotics: The patient does not require                         prophylactic  antibiotics. Prior Anticoagulants: The                         patient has taken no anticoagulant or antiplatelet                         agents. ASA Grade Assessment: II - A patient with mild                         systemic disease. After reviewing the risks and                         benefits, the patient was deemed in satisfactory                         condition to undergo the procedure. The anesthesia                         plan was to use general anesthesia. Immediately prior                         to administration of medications, the patient was                         re-assessed for adequacy to receive sedatives. The                         heart rate, respiratory rate, oxygen saturations,                         blood pressure, adequacy of pulmonary ventilation, and                         response to care were monitored throughout the                         procedure. The physical status of the patient was                         re-assessed after the procedure.                        After obtaining informed consent, the colonoscope was                         passed under direct vision. Throughout the procedure,                         the patient's blood pressure, pulse, and oxygen                         saturations were monitored continuously. The  Colonoscope was introduced through the anus and                         advanced to the the terminal ileum, with                         identification of the appendiceal orifice and IC                         valve. The colonoscopy was performed without                         difficulty. The patient tolerated the procedure well.                         The quality of the bowel preparation was evaluated                         using the BBPS [facility] Bowel Preparation Scale) with                         scores of: Right Colon = 3, Transverse Colon = 3 and                         Left Colon = 3 (entire  mucosa seen well with no                         residual staining, small fragments of stool or opaque                         liquid). The total BBPS score equals 9. The terminal                         ileum, ileocecal valve, appendiceal orifice, and                         rectum were photographed. Findings:      The perianal and digital rectal examinations were normal. Pertinent       negatives include normal sphincter tone and no palpable rectal lesions.      Three sessile polyps were found in the transverse colon. The polyps were       5 to 8 mm in size. These polyps were removed with a cold snare.       Resection and retrieval were complete.      A 12 mm polyp was found in the transverse colon. The polyp was sessile.       The polyp was removed with a hot snare. Resection and retrieval were       complete. Estimated blood loss: none. To prevent bleeding after the       polypectomy, one hemostatic clip was successfully placed (MR safe). Clip       manufacturer: [employer]. There was no bleeding during, or at the       end, of the procedure.      The terminal ileum appeared normal. Biopsies were taken with a cold       forceps for histology.      Normal mucosa was found in the entire colon. Biopsies  were taken with a       cold forceps for histology.      The retroflexed view of the distal rectum and anal verge was normal and       showed no anal or rectal abnormalities.      Non-bleeding external hemorrhoids were found during retroflexion. The       hemorrhoids were medium-sized. Impression:            - Three 5 to 8 mm polyps in the transverse colon,                         removed with a cold snare. Resected and retrieved.                        - One 12 mm polyp in the transverse colon, removed                         with a hot snare. Resected and retrieved. Clip                         manufacturer: [employer]. Clip (MR safe) was                          placed.                        - The examined portion of the ileum was normal.                         Biopsied.                        - Normal mucosa in the entire examined colon. Biopsied.                        - The distal rectum and anal verge are normal on                         retroflexion view.                        - Non-bleeding external hemorrhoids. Recommendation:        - Discharge patient to home (with escort).                        - Resume previous diet today.                        - Continue present medications.                        - Await pathology results.                        - Repeat colonoscopy in 3 years for surveillance of                         multiple polyps. Procedure Code(s):     --- Professional ---  16109, Colonoscopy, flexible; with removal of                         tumor(s), polyp(s), or other lesion(s) by snare                         technique                        45380, 59, Colonoscopy, flexible; with biopsy, single                         or multiple Diagnosis Code(s):     --- Professional ---                        D12.3, Benign neoplasm of transverse colon (hepatic                         flexure or splenic flexure)                        Z12.11, Encounter for screening for malignant neoplasm                         of colon CPT copyright 2022 American Medical Association. All rights reserved. The codes documented in this report are preliminary and upon coder review may  be revised to meet current compliance requirements. Dr. Libby Maw Toney Reil MD, MD 11/01/2022 11:00:49 AM This report has been signed electronically. Number of Addenda: 0 Note Initiated On: 11/01/2022 10:03 AM Scope Withdrawal Time: 0 hours 25 minutes 33 seconds  Total Procedure Duration: 0 hours 29 minutes 7 seconds  Estimated Blood Loss:  Estimated blood loss: none.      [facility]

## 2022-11-01 NOTE — Transfer of Care (Signed)
Immediate Anesthesia Transfer of Care Note  Patient: [name]  Procedure(s) Performed: COLONOSCOPY WITH PROPOFOL  Patient Location: Endoscopy Unit  Anesthesia Type:General  Level of Consciousness: alert  and patient cooperative  Airway & Oxygen Therapy: Patient Spontanous Breathing and Patient connected to nasal cannula oxygen  Post-op Assessment: Report given to RN and Patient moving all extremities X 4  Post vital signs: Reviewed and stable  Last Vitals:  Vitals Value Taken Time  BP 89/52 11/01/22 1103  Temp 36.2 C 11/01/22 1100  Pulse 53 11/01/22 1104  Resp 12 11/01/22 1104  SpO2 99 % 11/01/22 1104  Vitals shown include unvalidated device data.  Last Pain:  Vitals:   11/01/22 1100  TempSrc: Temporal  PainSc: Asleep         Complications: No notable events documented.

## 2022-11-01 NOTE — H&P (Signed)
Doris Repress, MD [address]  [address]  [place], [place] [address]  Main: [phone]  Fax: [phone] Pager: [phone]  Primary Care Physician:  [name], FNP Primary Gastroenterologist:  Dr. Arlyss Flores  Pre-Procedure History & Physical: HPI:  [name] is a 47 y.o. female is here for an colonoscopy.   Past Medical History:  Diagnosis Date   BRCA negative 07/2018   MyRisk neg   Family history of breast cancer    IBIS=13%/riskscore=8.4%   Family history of ovarian cancer    Family history of pancreatic cancer    GERD (gastroesophageal reflux disease)    History of frequent urinary tract infections    Hypoglycemia    IBS (irritable bowel syndrome)    Migraine headache    linger for days when they happen   Vertigo    none for several years    Past Surgical History:  Procedure Laterality Date   ABDOMINAL HYSTERECTOMY  07/2006   PARTIAL   APPENDECTOMY     BACK SURGERY     CHOLECYSTECTOMY     DIAGNOSTIC LAPAROSCOPY     test on bladder   ESOPHAGOGASTRODUODENOSCOPY (EGD) WITH PROPOFOL N/A 06/25/2022   Procedure: ESOPHAGOGASTRODUODENOSCOPY (EGD) WITH PROPOFOL;  Surgeon: Toney Reil, MD;  Location: [facility];  Service: Endoscopy;  Laterality: N/A;   GALLBLADDER SURGERY     LAPAROSCOPIC APPENDECTOMY N/A 12/08/2016   Procedure: APPENDECTOMY LAPAROSCOPIC;  Surgeon: Leafy Ro, MD;  Location: [facility];  Service: General;  Laterality: N/A;   MAXIMUM ACCESS (MAS)POSTERIOR LUMBAR INTERBODY FUSION (PLIF) 1 LEVEL N/A 05/06/2013   Procedure: LUMBAR FIVE-SACRAL ONE MAXIMUM ACCESS POSTERIOR LUMBAR INTERBODY FUSION;  Surgeon: Tia Alert, MD;  Location: [facility];  Service: Neurosurgery;  Laterality: N/A;   TOE SURGERY     TUBAL LIGATION     WISDOM TOOTH EXTRACTION      Prior to Admission medications   Medication Sig Start Date End Date Taking? Authorizing Provider  Cholecalciferol (VITAMIN D3) 50 MCG (2000 UT) CAPS Take 1  capsule (2,000 Units total) by mouth daily. 07/24/22  Yes Raynell Scott, Loel Dubonnet, MD  co-enzyme Q-10 30 MG capsule Take 30 mg by mouth 3 (three) times daily. Taking once daily   Yes [provider]  FLUoxetine (PROZAC) 40 MG capsule Take 1 capsule (40 mg total) by mouth daily. 05/09/22  Yes [name], FNP  fluticasone (FLONASE) 50 MCG/ACT nasal spray Place 2 sprays into both nostrils daily. 09/12/22  Yes Waldon Merl, PA-C  Magnesium 400 MG CAPS Take 400 mg by mouth daily.   Yes [provider]  rosuvastatin (CRESTOR) 10 MG tablet Take 1 tablet (10 mg total) by mouth daily. 06/24/22  Yes [name], FNP  benzonatate (TESSALON) 100 MG capsule Take 1 capsule (100 mg total) by mouth 3 (three) times daily as needed for cough. 09/12/22   Waldon Merl, PA-C  Blood Glucose Monitoring Suppl DEVI 1 each by Does not apply route 2 (two) times daily as needed. May substitute to any manufacturer covered by patient's insurance. 07/17/22   [name], FNP  DOTTI 0.0375 MG/24HR Place 1 patch onto the skin 2 (two) times a week. 08/27/22   [provider]  hydrOXYzine (ATARAX) 25 MG tablet TAKE 2 TABLETS BY MOUTH EVERY 8 HOURS AS NEEDED FOR  ITCHING,  AND  1-2  TABS  AT  BEDTIME 10/14/22   Elloise Roark, Loel Dubonnet, MD  lipase/protease/amylase (CREON) 36000 UNITS  CPEP capsule Take 2 capsules with the first bite of each meal and 1 capsule with the first bite of each snack 10/02/22   Toney Reil, MD  nortriptyline (PAMELOR) 10 MG capsule Take nortriptyline 10 mg at night for one week then increase to 20 mg at night and continue this dose. 07/17/22   [provider]  omeprazole (PRILOSEC) 40 MG capsule Take 1 capsule by mouth once daily 10/31/22   Toney Reil, MD  phenazopyridine (PYRIDIUM) 100 MG tablet Take 1 tablet (100 mg total) by mouth 3 (three) times daily as needed for pain. 09/04/22   Margaretann Loveless, PA-C  progesterone (PROMETRIUM) 100 MG capsule Take 100  mg by mouth at bedtime. 08/27/22   [provider]  promethazine (PHENERGAN) 12.5 MG tablet Take 1 tablet (12.5 mg total) by mouth every 8 (eight) hours as needed for nausea or vomiting. 09/23/22   Toney Reil, MD  traZODone (DESYREL) 100 MG tablet Take 1-2 tablets PO as needed to assist with sleep 04/03/22   [name], FNP  valACYclovir (VALTREX) 500 MG tablet Take 2 tablets (1,000 mg total) by mouth 2 (two) times daily. Patient not taking: Reported on 09/30/2022 03/18/22   Merita Norton T, FNP  Vitamin D, Ergocalciferol, (DRISDOL) 1.25 MG (50000 UNIT) CAPS capsule Take 1 capsule by mouth once a week 10/14/22   Toney Reil, MD    Allergies as of 09/23/2022 - Review Complete 09/23/2022  Allergen Reaction Noted   Codeine Nausea And Vomiting 03/10/2013   Sulfa antibiotics Rash 03/10/2013   Vicodin [hydrocodone-acetaminophen] Nausea Only 03/12/2013    Family History  Problem Relation Age of Onset   Hypertension Mother    COPD Mother    Epilepsy Mother    Sleep apnea Mother    Hypertension Father    Sleep apnea Father    Breast cancer Maternal Aunt 77   Ovarian cancer Maternal Aunt 45   Colon cancer Paternal Uncle 62   Pancreatic cancer Maternal Grandmother 56   Colon cancer Maternal Grandfather 36    Social History   Socioeconomic History   Marital status: Legally Separated    Spouse name: Not on file   Number of children: Not on file   Years of education: Not on file   Highest education level: Not on file  Occupational History   Not on file  Tobacco Use   Smoking status: Every Day    Packs/day: 0.75    Years: 30.00    Additional pack years: 0.00    Total pack years: 22.50    Types: Cigarettes    Last attempt to quit: 05/30/2018    Years since quitting: 4.4   Smokeless tobacco: Never   Tobacco comments:    15 cigarettes daily 09/27/2022  Vaping Use   Vaping Use: Some days  Substance and Sexual Activity   Alcohol use: Yes    Comment: 2-6  beers daily, down from 12+   Drug use: No   Sexual activity: Yes    Partners: Female, Female    Birth control/protection: Surgical    Comment: Hysterectomy  Other Topics Concern   Not on file  Social History Narrative   Not on file   Social Determinants of Health   Financial Resource Strain: High Risk (09/18/2021)   Overall Financial Resource Strain (CARDIA)    Difficulty of Paying Living Expenses: Hard  Food Insecurity: No Food Insecurity (06/07/2022)   Hunger Vital Sign    Worried About  Running Out of Food in the Last Year: Never true    Ran Out of Food in the Last Year: Never true  Transportation Needs: No Transportation Needs (06/07/2022)   PRAPARE - Administrator, Civil Service (Medical): No    Lack of Transportation (Non-Medical): No  Physical Activity: Not on file  Stress: Not on file  Social Connections: Socially Isolated (09/18/2021)   Social Connection and Isolation Panel [NHANES]    Frequency of Communication with Friends and Family: More than three times a week    Frequency of Social Gatherings with Friends and Family: Three times a week    Attends Religious Services: Never    Active Member of Clubs or Organizations: No    Attends Banker Meetings: Never    Marital Status: Separated  Intimate Partner Violence: Not At Risk (06/07/2022)   Humiliation, Afraid, Rape, and Kick questionnaire    Fear of Current or Ex-Partner: No    Emotionally Abused: No    Physically Abused: No    Sexually Abused: No    Review of Systems: See HPI, otherwise negative ROS  Physical Exam: BP 115/84   Pulse 71   Temp (!) 97 F (36.1 C) (Temporal)   Resp 16   Ht 5\' 6"  (1.676 m)   Wt 87.8 kg   SpO2 98%   BMI 31.25 kg/m  General:   Alert,  pleasant and cooperative in NAD Head:  Normocephalic and atraumatic. Neck:  Supple; no masses or thyromegaly. Lungs:  Clear throughout to auscultation.    Heart:  Regular rate and rhythm. Abdomen:  Soft, nontender and  nondistended. Normal bowel sounds, without guarding, and without rebound.   Neurologic:  Alert and  oriented x4;  grossly normal neurologically.  Impression/Plan: Tameron Urtado is here for an colonoscopy to be performed for colon cancer screening  Risks, benefits, limitations, and alternatives regarding  colonoscopy have been reviewed with the patient.  Questions have been answered.  All parties agreeable.   Lannette Donath, MD  11/01/2022, 9:36 AM

## 2022-11-04 ENCOUNTER — Encounter: Payer: Self-pay | Admitting: Gastroenterology

## 2022-11-04 ENCOUNTER — Ambulatory Visit: Payer: 59 | Admitting: Student in an Organized Health Care Education/Training Program

## 2022-11-06 ENCOUNTER — Encounter: Payer: Self-pay | Admitting: Gastroenterology

## 2022-11-06 MED ORDER — ZENPEP 40000-126000 UNITS PO CPEP: ORAL_CAPSULE | ORAL | 0 refills | Status: DC

## 2022-11-14 ENCOUNTER — Ambulatory Visit: Payer: 59 | Attending: Student in an Organized Health Care Education/Training Program

## 2022-11-14 DIAGNOSIS — F17210 Nicotine dependence, cigarettes, uncomplicated: Secondary | ICD-10-CM | POA: Insufficient documentation

## 2022-11-14 DIAGNOSIS — R0609 Other forms of dyspnea: Secondary | ICD-10-CM | POA: Insufficient documentation

## 2022-11-14 DIAGNOSIS — R053 Chronic cough: Secondary | ICD-10-CM | POA: Diagnosis present

## 2022-11-14 LAB — PULMONARY FUNCTION TEST ARMC ONLY
DL/VA % pred: 70 %
DL/VA: 3.05 ml/min/mmHg/L
DLCO unc % pred: 73 %
DLCO unc: 16.94 ml/min/mmHg
FEF 25-75 Post: 4.27 L/sec
FEF 25-75 Pre: 3.41 L/sec
FEF2575-%Change-Post: 25 %
FEF2575-%Pred-Post: 139 %
FEF2575-%Pred-Pre: 111 %
FEV1-%Change-Post: 7 %
FEV1-%Pred-Post: 105 %
FEV1-%Pred-Pre: 98 %
FEV1-Post: 3.29 L
FEV1-Pre: 3.06 L
FEV1FVC-%Change-Post: 0 %
FEV1FVC-%Pred-Pre: 102 %
FEV6-%Change-Post: 7 %
FEV6-%Pred-Post: 104 %
FEV6-%Pred-Pre: 97 %
FEV6-Post: 3.96 L
FEV6-Pre: 3.7 L
FEV6FVC-%Pred-Post: 102 %
FEV6FVC-%Pred-Pre: 102 %
FVC-%Change-Post: 7 %
FVC-%Pred-Post: 102 %
FVC-%Pred-Pre: 95 %
FVC-Post: 3.96 L
FVC-Pre: 3.7 L
Post FEV1/FVC ratio: 83 %
Post FEV6/FVC ratio: 100 %
Pre FEV1/FVC ratio: 83 %
Pre FEV6/FVC Ratio: 100 %
RV % pred: 110 %
RV: 1.97 L
TLC % pred: 110 %
TLC: 5.92 L

## 2022-11-14 MED ORDER — ALBUTEROL SULFATE (2.5 MG/3ML) 0.083% IN NEBU
2.5000 mg | INHALATION_SOLUTION | Freq: Once | RESPIRATORY_TRACT | Status: AC
  Administered 2022-11-14: 2.5 mg via RESPIRATORY_TRACT
  Filled 2022-11-14: qty 3

## 2022-11-14 NOTE — Anesthesia Postprocedure Evaluation (Signed)
Anesthesia Post Note  Patient: [name]  Procedure(s) Performed: COLONOSCOPY WITH PROPOFOL  Patient location during evaluation: PACU Anesthesia Type: General Level of consciousness: awake and awake and alert Pain management: pain level controlled Vital Signs Assessment: post-procedure vital signs reviewed and stable Respiratory status: spontaneous breathing Cardiovascular status: stable Anesthetic complications: no   No notable events documented.   Last Vitals:  Vitals:   11/01/22 1110 11/01/22 1120  BP: 90/64 91/60  Pulse: (!) 58 (!) 47  Resp: 16 14  Temp:    SpO2: 100% 100%    Last Pain:  Vitals:   11/02/22 1246  TempSrc:   PainSc: 0-No pain                 VAN STAVEREN,[name]

## 2022-11-20 ENCOUNTER — Encounter: Payer: Self-pay | Admitting: Gastroenterology

## 2022-11-22 ENCOUNTER — Ambulatory Visit: Payer: 59 | Admitting: Student in an Organized Health Care Education/Training Program

## 2022-11-22 MED ORDER — LINACLOTIDE 290 MCG PO CAPS: 290.0000 ug | ORAL_CAPSULE | Freq: Every day | ORAL | 1 refills | Status: DC

## 2022-11-25 ENCOUNTER — Telehealth: Payer: Self-pay

## 2022-11-25 NOTE — Telephone Encounter (Signed)
Submitted PA through cover my meds for Linzess 290. Waiting on response from insurance company

## 2022-11-25 NOTE — Telephone Encounter (Signed)
Approved today Request Reference Number: [id number]. LINZESS CAP is approved through 11/25/2023. Your patient may now fill this prescription and it will be covered. Authorization Ex

## 2022-11-26 ENCOUNTER — Telehealth: Payer: Self-pay

## 2022-11-26 NOTE — Telephone Encounter (Signed)
Spoke to patient. She stated that she no showed 5/23 PFT appt as she was not made aware of appt but she did show for 11/14/2022 appt. PFT is available in epic. She will keep scheduled visit.  Nothing further needed.

## 2022-11-26 NOTE — Telephone Encounter (Signed)
PFT ordered at last OV. Pt no showed for PFT.  She is scheduled to see Dr. Aundria Rud 06/26 to discuss results.  Lm for patient.

## 2022-11-27 ENCOUNTER — Ambulatory Visit: Payer: 59 | Admitting: Student in an Organized Health Care Education/Training Program

## 2022-11-27 ENCOUNTER — Encounter: Payer: Self-pay | Admitting: Student in an Organized Health Care Education/Training Program

## 2022-11-27 VITALS — BP 122/76 | HR 90 | Temp 97.7°F | Ht 66.0 in | Wt 196.0 lb

## 2022-11-27 DIAGNOSIS — R053 Chronic cough: Secondary | ICD-10-CM | POA: Diagnosis not present

## 2022-11-27 DIAGNOSIS — F17200 Nicotine dependence, unspecified, uncomplicated: Secondary | ICD-10-CM

## 2022-11-27 MED ORDER — NICOTINE 21 MG/24HR TD PT24
21.0000 mg | MEDICATED_PATCH | TRANSDERMAL | 0 refills | Status: AC
Start: 2022-11-27 — End: 2023-01-08

## 2022-11-27 MED ORDER — NICOTINE POLACRILEX 2 MG MT LOZG
2.0000 mg | LOZENGE | OROMUCOSAL | 3 refills | Status: AC | PRN
Start: 2022-11-27 — End: 2023-02-25

## 2022-11-27 MED ORDER — NICOTINE 7 MG/24HR TD PT24
7.0000 mg | MEDICATED_PATCH | TRANSDERMAL | 0 refills | Status: AC
Start: 2022-11-27 — End: 2022-12-11

## 2022-11-27 MED ORDER — NICOTINE 14 MG/24HR TD PT24
14.0000 mg | MEDICATED_PATCH | TRANSDERMAL | 0 refills | Status: AC
Start: 2022-11-27 — End: 2022-12-11

## 2022-11-27 NOTE — Patient Instructions (Signed)
[facility]: Call 1-800-QUIT-NOW (1-800-784-8669). The [facility] is a free service for [place] residents. Trained counselors are available from 8 am until 3 am, 365 days per year. Services are available in both English and Spanish.   Web Resources Free online support programs can help you track your progress and share experiences with others who are quitting. These are examples: www.becomeanex.org www.trytostop.org  www.smokefree.gov  www.everydayhealth.com/smoking-cessation/index.aspx  [facility]: offers comprehensive in-person tobacco treatment counseling at [facility] ([address] [place] [address]).  Open to everyone. Virtual appointments available. Free parking. Call [phone] to schedule an appointment or [phone] for general information.    Tobacco Cessation Medications  Nicotine Replacement Therapy (NRT)  Nicotine is the addictive part of tobacco smoke, but not the most dangerous part. There are 7000 other toxins in cigarettes, including carbon monoxide, that cause disease. People do not generally become addicted to medication. Common problems: People don't use enough medication or stop too early. Medications are safe and effective. Overdose is very uncommon. Use medications as long as needed (3 months minimum). Some combinations work better than single medications. Long acting medications like the NRT patch and bupropion provide continuous treatment for withdrawal symptoms.  PLUS  Short acting medications like the NRT gum, lozenge, inhaler, and nasal spray help people to cope with breakthrough cravings.  ? Nicotine Patch  Place patch on hairless skin on upper body, including arms and back. Each day: discard old patch, shower, apply new patch to a different site. Apply hydrocortisone cream to mildly red/irritated areas. Call provider if rash develops. If patch causes sleep disturbance, remove patch  at bedtime and replace each morning after shower. Side effects may include: skin irritation, headache, insomnia, abnormal/vivid dreams.  ? Nicotine Gum  Chew gum slowly, park in cheek when peppery taste or tingling sensation begins (about 15-30 chews). When taste or tingling goes away, begin chewing again. Use until nicotine is gone (taste or tingle does not return, usually 30 minutes). Park in different areas of mouth. Nicotine is absorbed through the lining of the mouth. Use enough to control cravings, up to 24 pieces per day (if used alone). Avoid eating or drinking for 15 minutes before using and during use. Side effects may include: mouth/jaw soreness, hiccups, indigestion, hypersalivation.  If gum is not chewed correctly, additional side effects may include lightheadedness, nausea/vomiting, throat and mouth irritation.  ? Nicotine Lozenge  Allow to dissolve slowly in mouth (20-30 minutes). Do not chew or swallow. Nicotine release may cause a warm tingling sensation. Occasionally rotate to different areas of the mouth. Use enough to control cravings, up to 20 lozenges per day (if used alone). Avoid eating or drinking for 15 minutes before using and during use. Side effects may include: nausea, hiccups, cough, heartburn, headache, gas, insomnia.  ? Nicotine Nasal Spray Use 1 spray in each nostril (1 dose) and tilt head back for 1 minute. Do not sniff, swallow, or inhale through nose.  Use at least 8 doses (1 spray in each nostril) , up to 40 doses per day (if used alone). To reduce nasal irritation, spray on cotton swab and insert into nose. Side effects may include: nasal and/or throat irritation (hot, peppery, or burning sensation), nasal irritation, tearing, sneezing, cough, headache.  ? Nicotine Oral Inhaler (puffer) Inhale into the back of the throat or puff in short breaths. Do not inhale into the lungs.  Puff continuously for 20 minutes (about 80 puffs) until cartridge   is  empty. Change cartridge when it loses the "burning in throat" sensation (feels like air only). Open cartridges can be saved and used again within 24 hours. Use at least 6 and up to 16 cartridges per day (if used alone).  Avoid eating or drinking for 15 minutes before using and during use. Side effects may include: mouth and/or throat irritation, unpleasant taste, cough, nasal irritation, indigestion, hiccups, headache.  ? Chantix (varenicline) Days 1-3: Take one 0.5 mg white pill each morning for 3 days, one week before quit date. Days 4-7: Increase to one 0.5 mg white pill twice a day in morning and evening for 4 days.  On Day 8 (target quit date), increase to one 1 mg blue pill twice a day. Maintain this dose for a minimum of 3 months. Take with food and a full glass of water to reduce nausea. Be sure that the two doses are at least 8 hours apart, but try to take second dose early in the evening (i.e. 6 pm) to avoid sleep problems. Common side effects include: nausea, insomnia, headache, abnormal/vivid dreams. Tell your doctor if you have any history of psychiatric illness prior to starting Chantix.  STOP taking CHANTIX and contact a healthcare provider immediately if you experience agitation, hostility, depressed mood, changes in thoughts or behavior that are not typical for you, thinking about or attempting suicide, allergic or skin reactions including swelling, rash, redness, or peeling of the skin.  For patients who have heart disease: Smoking is a major risk factor for cardiovascular disease, and Chantix can help you quit smoking. Chantix may be associated with a small, increased risk of certain heart events in patients who have heart disease. If you have any new or worsening symptoms of heart disease while taking Chantix, such as shortness of breath or trouble breathing, new or worsening chest pain, or new or worsening pain in your legs when walking, call your doctor or get emergency medical  help immediately.  ? Wellbutrin / Zyban (bupropion) Take one 150 mg pill each morning for 3 days, one week before target quit date. On Day 4, increase to one 150 mg pill twice a day, morning and evening.  Maintain this dose for a minimum of 3 months. Be sure that the two doses are at least 8 hours apart, but try to take second dose early in the evening (i.e. 6 pm) to avoid sleep problems. Avoid or minimize use of alcohol when taking this medication. Common side effects include: dry mouth, headache, insomnia, nausea, weight loss.  Risk of seizure is 06/998. STOP taking BUPROPION and contact a healthcare provider immediately if you experience agitation, hostility, depressed mood, changes in thoughts or behavior that are not typical for you, thinking about or attempting suicide, allergic or skin reactions including swelling, rash, redness, or peeling of the skin.  

## 2022-11-27 NOTE — Progress Notes (Signed)
Synopsis: Referred in for chronic cough by [name], FNP  Assessment & Plan:   1. Chronic cough  She is presenting for chronic cough of a few months in duration. This cough is improved with dietary modifications. PFT's did now show obstruction on spirometry, and lung volumes were within normal. DLCo mildly reduced and this is likely secondary to her smoking history. Double contrast esophagogram is showing esophageal dysmotility and reflux with silent aspiration, overall consistent with reflux associated cough. We again discussed dietary interventions to help with cough, and she's already felt some improvement with the modifications she's done. Discussed further interventions to help with her symptoms.  2. Tobacco use disorder  Cut down in smoking and willing to try full smoking cessation. Counseled the patient on the importance of full cessation. Will send prescriptions for nicotine patches and lozenges.  - nicotine polacrilex (NICOTINE MINI) 2 MG lozenge; Take 1 lozenge (2 mg total) by mouth every 2 (two) hours as needed for smoking cessation.  Dispense: 72 lozenge; Refill: 3 - nicotine (NICODERM CQ - DOSED IN MG/24 HR) 7 mg/24hr patch; Place 1 patch (7 mg total) onto the skin daily for 14 days.  Dispense: 14 patch; Refill: 0 - nicotine (NICODERM CQ - DOSED IN MG/24 HOURS) 14 mg/24hr patch; Place 1 patch (14 mg total) onto the skin daily for 14 days.  Dispense: 14 patch; Refill: 0 - nicotine (NICODERM CQ - DOSED IN MG/24 HOURS) 21 mg/24hr patch; Place 1 patch (21 mg total) onto the skin daily.  Dispense: 42 patch; Refill: 0   Return if symptoms worsen or fail to improve.  I spent 30 minutes caring for this patient today, including preparing to see the patient, obtaining a medical history , reviewing a separately obtained history, performing a medically appropriate examination and/or evaluation, counseling and educating the patient/family/caregiver, documenting clinical information in  the electronic health record, and independently interpreting results (not separately reported/billed) and communicating results to the patient/family/caregiver. 6 minutes spent on smoking cessation counseling.  [name], MD [facility] 11/27/2022 10:04 AM    End of visit medications:  Meds ordered this encounter  Medications   nicotine polacrilex (NICOTINE MINI) 2 MG lozenge    Sig: Take 1 lozenge (2 mg total) by mouth every 2 (two) hours as needed for smoking cessation.    Dispense:  72 lozenge    Refill:  3   nicotine (NICODERM CQ - DOSED IN MG/24 HR) 7 mg/24hr patch    Sig: Place 1 patch (7 mg total) onto the skin daily for 14 days.    Dispense:  14 patch    Refill:  0   nicotine (NICODERM CQ - DOSED IN MG/24 HOURS) 14 mg/24hr patch    Sig: Place 1 patch (14 mg total) onto the skin daily for 14 days.    Dispense:  14 patch    Refill:  0   nicotine (NICODERM CQ - DOSED IN MG/24 HOURS) 21 mg/24hr patch    Sig: Place 1 patch (21 mg total) onto the skin daily.    Dispense:  42 patch    Refill:  0     Current Outpatient Medications:    Blood Glucose Monitoring Suppl DEVI, 1 each by Does not apply route 2 (two) times daily as needed. May substitute to any manufacturer covered by patient's insurance., Disp: 1 each, Rfl: 0   Cholecalciferol (VITAMIN D3) 50 MCG (2000 UT) CAPS, Take 1 capsule (2,000 Units total) by mouth daily.,  Disp: 30 capsule, Rfl: 4   DOTTI 0.0375 MG/24HR, Place 1 patch onto the skin 2 (two) times a week., Disp: , Rfl:    FLUoxetine (PROZAC) 40 MG capsule, Take 1 capsule (40 mg total) by mouth daily., Disp: 90 capsule, Rfl: 3   fluticasone (FLONASE) 50 MCG/ACT nasal spray, Place 2 sprays into both nostrils daily., Disp: 16 g, Rfl: 0   hydrOXYzine (ATARAX) 25 MG tablet, TAKE 2 TABLETS BY MOUTH EVERY 8 HOURS AS NEEDED FOR  ITCHING,  AND  1-2  TABS  AT  BEDTIME, Disp: 60 tablet, Rfl: 0   linaclotide (LINZESS) 290 MCG CAPS capsule, Take 1 capsule  (290 mcg total) by mouth daily before breakfast., Disp: 30 capsule, Rfl: 1   nicotine (NICODERM CQ - DOSED IN MG/24 HOURS) 14 mg/24hr patch, Place 1 patch (14 mg total) onto the skin daily for 14 days., Disp: 14 patch, Rfl: 0   nicotine (NICODERM CQ - DOSED IN MG/24 HOURS) 21 mg/24hr patch, Place 1 patch (21 mg total) onto the skin daily., Disp: 42 patch, Rfl: 0   nicotine (NICODERM CQ - DOSED IN MG/24 HR) 7 mg/24hr patch, Place 1 patch (7 mg total) onto the skin daily for 14 days., Disp: 14 patch, Rfl: 0   nicotine polacrilex (NICOTINE MINI) 2 MG lozenge, Take 1 lozenge (2 mg total) by mouth every 2 (two) hours as needed for smoking cessation., Disp: 72 lozenge, Rfl: 3   nortriptyline (PAMELOR) 10 MG capsule, Take nortriptyline 10 mg at night for one week then increase to 20 mg at night and continue this dose., Disp: , Rfl:    omeprazole (PRILOSEC) 40 MG capsule, Take 1 capsule by mouth once daily, Disp: 30 capsule, Rfl: 2   Pancrelipase, Lip-Prot-Amyl, (ZENPEP) 40000-126000 units CPEP, Take 1 capsules with the first bite of each meal and 1 capsule with the first bite of each snack, Disp: 150 capsule, Rfl: 0   phenazopyridine (PYRIDIUM) 100 MG tablet, Take 1 tablet (100 mg total) by mouth 3 (three) times daily as needed for pain., Disp: 10 tablet, Rfl: 0   progesterone (PROMETRIUM) 100 MG capsule, Take 100 mg by mouth at bedtime., Disp: , Rfl:    promethazine (PHENERGAN) 12.5 MG tablet, Take 1 tablet (12.5 mg total) by mouth every 8 (eight) hours as needed for nausea or vomiting., Disp: 30 tablet, Rfl: 0   rosuvastatin (CRESTOR) 10 MG tablet, Take 1 tablet (10 mg total) by mouth daily., Disp: 90 tablet, Rfl: 3   traZODone (DESYREL) 100 MG tablet, Take 1-2 tablets PO as needed to assist with sleep, Disp: 180 tablet, Rfl: 3   valACYclovir (VALTREX) 500 MG tablet, Take 2 tablets (1,000 mg total) by mouth 2 (two) times daily., Disp: 20 tablet, Rfl: 11   Vitamin D, Ergocalciferol, (DRISDOL) 1.25 MG (50000  UNIT) CAPS capsule, Take 1 capsule by mouth once a week, Disp: 12 capsule, Rfl: 0   benzonatate (TESSALON) 100 MG capsule, Take 1 capsule (100 mg total) by mouth 3 (three) times daily as needed for cough. (Patient not taking: Reported on 11/27/2022), Disp: 30 capsule, Rfl: 0   co-enzyme Q-10 30 MG capsule, Take 30 mg by mouth 3 (three) times daily. Taking once daily (Patient not taking: Reported on 11/27/2022), Disp: , Rfl:    Magnesium 400 MG CAPS, Take 400 mg by mouth daily. (Patient not taking: Reported on 11/27/2022), Disp: , Rfl:    Subjective:   PATIENT ID: [name] GENDER: female DOB: Jul 23, 1975, MRN: [id number]  Chief Complaint  Patient presents with   Follow-up    PFT- SOB and prod cough with clear to brown sputum.     HPI  Ms. Madrazo is a pleasant 47 year old female presenting to clinic for the evaluation of cough.  She feels that the cough has compared compared to prior. The continues to have it nocturnally, but it has improved during the day. She's cut down on some of the foods and beverages that can contribute to reflux (such as coffee). She also describes a healthier diet in general. Patient continues to smoke, but attempting to cut down, now down to 10 cigarettes a day. She's been seen and followed by GI, and prescribed pantoprazole for heart burn. She had her PFT's and swallowing study and is here to discuss the results.   Initial history obtained from Ms. Jaskot noted a persistent cough with no other associated symptoms such as chest pain, tightness, wheezing, pleurisy, or shortness of breath. She denies any hemoptysis. She does report a runny nose and has a scratchy sensation in the back of her throat. She feels the cough got worse as she gained some weight. She was using claritin and flonase.   Patient reports living in the same house for a long time, with no recent change to her living situation. The house is old and might have mold, but this is not new. She reports  history of bronchitis as a child, but reports outgrowing it. Reports strong family history of asthma in her children as well as her parents. She does not carry a personal history of asthma.   Patient was admitted to the hospital January of 2024 for headache where she underwent a lumbar puncture that did not suggest any infectious etiology. She was diagnosed with Influenza A and was also noted to be positive for CMV. She's been followed by Dr. Allegra Lai from gastroenterology for fatty liver disease and reflux disease, treated with omeprazole.   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 She is an active smoker, currently smoking 10 cigarettes per day. She has around 25 pack years of smoking history.  Ancillary information including prior medications, full medical/surgical/family/social histories, and PFTs (when available) are listed below and have been reviewed.   Review of Systems  Constitutional:  Negative for chills, fever, malaise/fatigue and weight loss.  Respiratory:  Positive for cough. Negative for hemoptysis, sputum production, shortness of breath and wheezing.   Cardiovascular:  Negative for chest pain, palpitations, leg swelling and PND.     Objective:   Vitals:   11/27/22 0925  BP: 122/76  Pulse: 90  Temp: 97.7 F (36.5 C)  TempSrc: Temporal  SpO2: 97%  Weight: 196 lb (88.9 kg)  Height: 5\' 6"  (1.676 m)   97% on RA  BMI Readings from Last 3 Encounters:  11/27/22 31.64 kg/m  11/01/22 31.25 kg/m  09/30/22 31.38 kg/m   Wt Readings from Last 3 Encounters:  11/27/22 196 lb (88.9 kg)  11/01/22 193 lb 9.6 oz (87.8 kg)  09/30/22 194 lb 6.4 oz (88.2 kg)    Physical Exam Constitutional:      Appearance: Normal appearance. She is obese. She is not ill-appearing.  Cardiovascular:     Rate and Rhythm: Normal rate.     Pulses: Normal pulses.     Heart sounds: Normal heart sounds.  Pulmonary:     Effort: Pulmonary effort is normal. No respiratory  distress.     Breath sounds: Normal breath sounds. No stridor.  No wheezing, rhonchi or rales.  Abdominal:     Palpations: Abdomen is soft.  Musculoskeletal:     Right lower leg: No edema.     Left lower leg: No edema.  Neurological:     General: No focal deficit present.     Mental Status: She is alert and oriented to person, place, and time. Mental status is at baseline.       Ancillary Information    Past Medical History:  Diagnosis Date   BRCA negative 07/2018   MyRisk neg   Family history of breast cancer    IBIS=13%/riskscore=8.4%   Family history of ovarian cancer    Family history of pancreatic cancer    GERD (gastroesophageal reflux disease)    History of frequent urinary tract infections    Hypoglycemia    IBS (irritable bowel syndrome)    Migraine headache    linger for days when they happen   Vertigo    none for several years     Family History  Problem Relation Age of Onset   Hypertension Mother    COPD Mother    Epilepsy Mother    Sleep apnea Mother    Hypertension Father    Sleep apnea Father    Breast cancer Maternal Aunt 50   Ovarian cancer Maternal Aunt 45   Colon cancer Paternal Uncle 86   Pancreatic cancer Maternal Grandmother 76   Colon cancer Maternal Grandfather 82     Past Surgical History:  Procedure Laterality Date   ABDOMINAL HYSTERECTOMY  07/2006   PARTIAL   APPENDECTOMY     BACK SURGERY     CHOLECYSTECTOMY     COLONOSCOPY WITH PROPOFOL N/A 11/01/2022   Procedure: COLONOSCOPY WITH PROPOFOL;  Surgeon: Toney Reil, MD;  Location: [facility];  Service: Gastroenterology;  Laterality: N/A;   DIAGNOSTIC LAPAROSCOPY     test on bladder   ESOPHAGOGASTRODUODENOSCOPY (EGD) WITH PROPOFOL N/A 06/25/2022   Procedure: ESOPHAGOGASTRODUODENOSCOPY (EGD) WITH PROPOFOL;  Surgeon: Toney Reil, MD;  Location: [facility];  Service: Endoscopy;  Laterality: N/A;   GALLBLADDER SURGERY     LAPAROSCOPIC APPENDECTOMY N/A  12/08/2016   Procedure: APPENDECTOMY LAPAROSCOPIC;  Surgeon: Leafy Ro, MD;  Location: [facility];  Service: General;  Laterality: N/A;   MAXIMUM ACCESS (MAS)POSTERIOR LUMBAR INTERBODY FUSION (PLIF) 1 LEVEL N/A 05/06/2013   Procedure: LUMBAR FIVE-SACRAL ONE MAXIMUM ACCESS POSTERIOR LUMBAR INTERBODY FUSION;  Surgeon: Tia Alert, MD;  Location: [facility];  Service: Neurosurgery;  Laterality: N/A;   TOE SURGERY     TUBAL LIGATION     WISDOM TOOTH EXTRACTION      Social History   Socioeconomic History   Marital status: Legally Separated    Spouse name: Not on file   Number of children: Not on file   Years of education: Not on file   Highest education level: Not on file  Occupational History   Not on file  Tobacco Use   Smoking status: Every Day    Packs/day: 0.75    Years: 30.00    Additional pack years: 0.00    Total pack years: 22.50    Types: Cigarettes   Smokeless tobacco: Never  Vaping Use   Vaping Use: Some days  Substance and Sexual Activity   Alcohol use: Yes    Comment: 2-6 beers daily, down from 12+   Drug use: No   Sexual activity: Yes    Partners: Female, Female    Birth control/protection:  Surgical    Comment: Hysterectomy  Other Topics Concern   Not on file  Social History Narrative   Not on file   Social Determinants of Health   Financial Resource Strain: High Risk (09/18/2021)   Overall Financial Resource Strain (CARDIA)    Difficulty of Paying Living Expenses: Hard  Food Insecurity: No Food Insecurity (06/07/2022)   Hunger Vital Sign    Worried About Running Out of Food in the Last Year: Never true    Ran Out of Food in the Last Year: Never true  Transportation Needs: No Transportation Needs (06/07/2022)   PRAPARE - Administrator, Civil Service (Medical): No    Lack of Transportation (Non-Medical): No  Physical Activity: Not on file  Stress: Not on file  Social Connections: Socially Isolated (09/18/2021)   Social Connection and  Isolation Panel [NHANES]    Frequency of Communication with Friends and Family: More than three times a week    Frequency of Social Gatherings with Friends and Family: Three times a week    Attends Religious Services: Never    Active Member of Clubs or Organizations: No    Attends Banker Meetings: Never    Marital Status: Separated  Intimate Partner Violence: Not At Risk (06/07/2022)   Humiliation, Afraid, Rape, and Kick questionnaire    Fear of Current or Ex-Partner: No    Emotionally Abused: No    Physically Abused: No    Sexually Abused: No     Allergies  Allergen Reactions   Codeine Nausea And Vomiting   Sulfa Antibiotics Rash   Vicodin [Hydrocodone-Acetaminophen] Nausea Only    And itching all over     CBC    Component Value Date/Time   WBC 5.4 09/10/2022 0835   WBC 8.2 07/04/2022 0758   RBC 4.50 09/10/2022 0835   RBC 4.16 07/04/2022 0758   HGB 13.6 09/10/2022 0835   HCT 39.8 09/10/2022 0835   PLT 256 09/10/2022 0835   MCV 88 09/10/2022 0835   MCV 91 05/14/2014 2239   MCH 30.2 09/10/2022 0835   MCH 30.3 07/04/2022 0758   MCHC 34.2 09/10/2022 0835   MCHC 33.8 07/04/2022 0758   RDW 12.5 09/10/2022 0835   RDW 12.7 05/14/2014 2239   LYMPHSABS 6.8 (H) 06/21/2022 1041   LYMPHSABS 1.4 10/23/2013 1022   MONOABS 0.5 06/06/2022 1334   MONOABS 0.7 10/23/2013 1022   EOSABS 0.3 06/21/2022 1041   EOSABS 0.1 10/23/2013 1022   BASOSABS 0.1 06/21/2022 1041   BASOSABS 0.1 10/23/2013 1022    Pulmonary Functions Testing Results:    Latest Ref Rng & Units 11/14/2022    9:31 AM  PFT Results  FVC-Pre L 3.70   FVC-Predicted Pre % 95   FVC-Post L 3.96   FVC-Predicted Post % 102   Pre FEV1/FVC % % 83   Post FEV1/FCV % % 83   FEV1-Pre L 3.06   FEV1-Predicted Pre % 98   FEV1-Post L 3.29   DLCO uncorrected ml/min/mmHg 16.94   DLCO UNC% % 73   DLVA Predicted % 70   TLC L 5.92   TLC % Predicted % 110   RV % Predicted % 110     Outpatient Medications Prior to  Visit  Medication Sig Dispense Refill   Blood Glucose Monitoring Suppl DEVI 1 each by Does not apply route 2 (two) times daily as needed. May substitute to any manufacturer covered by patient's insurance. 1 each 0   Cholecalciferol (VITAMIN D3)  50 MCG (2000 UT) CAPS Take 1 capsule (2,000 Units total) by mouth daily. 30 capsule 4   DOTTI 0.0375 MG/24HR Place 1 patch onto the skin 2 (two) times a week.     FLUoxetine (PROZAC) 40 MG capsule Take 1 capsule (40 mg total) by mouth daily. 90 capsule 3   fluticasone (FLONASE) 50 MCG/ACT nasal spray Place 2 sprays into both nostrils daily. 16 g 0   hydrOXYzine (ATARAX) 25 MG tablet TAKE 2 TABLETS BY MOUTH EVERY 8 HOURS AS NEEDED FOR  ITCHING,  AND  1-2  TABS  AT  BEDTIME 60 tablet 0   linaclotide (LINZESS) 290 MCG CAPS capsule Take 1 capsule (290 mcg total) by mouth daily before breakfast. 30 capsule 1   nortriptyline (PAMELOR) 10 MG capsule Take nortriptyline 10 mg at night for one week then increase to 20 mg at night and continue this dose.     omeprazole (PRILOSEC) 40 MG capsule Take 1 capsule by mouth once daily 30 capsule 2   Pancrelipase, Lip-Prot-Amyl, (ZENPEP) 40000-126000 units CPEP Take 1 capsules with the first bite of each meal and 1 capsule with the first bite of each snack 150 capsule 0   phenazopyridine (PYRIDIUM) 100 MG tablet Take 1 tablet (100 mg total) by mouth 3 (three) times daily as needed for pain. 10 tablet 0   progesterone (PROMETRIUM) 100 MG capsule Take 100 mg by mouth at bedtime.     promethazine (PHENERGAN) 12.5 MG tablet Take 1 tablet (12.5 mg total) by mouth every 8 (eight) hours as needed for nausea or vomiting. 30 tablet 0   rosuvastatin (CRESTOR) 10 MG tablet Take 1 tablet (10 mg total) by mouth daily. 90 tablet 3   traZODone (DESYREL) 100 MG tablet Take 1-2 tablets PO as needed to assist with sleep 180 tablet 3   valACYclovir (VALTREX) 500 MG tablet Take 2 tablets (1,000 mg total) by mouth 2 (two) times daily. 20 tablet 11    Vitamin D, Ergocalciferol, (DRISDOL) 1.25 MG (50000 UNIT) CAPS capsule Take 1 capsule by mouth once a week 12 capsule 0   benzonatate (TESSALON) 100 MG capsule Take 1 capsule (100 mg total) by mouth 3 (three) times daily as needed for cough. (Patient not taking: Reported on 11/27/2022) 30 capsule 0   co-enzyme Q-10 30 MG capsule Take 30 mg by mouth 3 (three) times daily. Taking once daily (Patient not taking: Reported on 11/27/2022)     Magnesium 400 MG CAPS Take 400 mg by mouth daily. (Patient not taking: Reported on 11/27/2022)     No facility-administered medications prior to visit.

## 2022-12-20 ENCOUNTER — Other Ambulatory Visit: Payer: Self-pay

## 2022-12-20 ENCOUNTER — Emergency Department
Admission: EM | Admit: 2022-12-20 | Discharge: 2022-12-20 | Disposition: A | Discharge status: Home / Self Care | Payer: 59 | Attending: Emergency Medicine | Admitting: Emergency Medicine

## 2022-12-20 DIAGNOSIS — R112 Nausea with vomiting, unspecified: Secondary | ICD-10-CM | POA: Diagnosis not present

## 2022-12-20 DIAGNOSIS — E860 Dehydration: Secondary | ICD-10-CM | POA: Diagnosis not present

## 2022-12-20 DIAGNOSIS — R531 Weakness: Secondary | ICD-10-CM | POA: Diagnosis not present

## 2022-12-20 DIAGNOSIS — R519 Headache, unspecified: Secondary | ICD-10-CM | POA: Insufficient documentation

## 2022-12-20 DIAGNOSIS — R197 Diarrhea, unspecified: Secondary | ICD-10-CM | POA: Diagnosis present

## 2022-12-20 LAB — CBC WITH DIFFERENTIAL/PLATELET
Abs Immature Granulocytes: 0.04 10*3/uL (ref 0.00–0.07)
Basophils Absolute: 0 10*3/uL (ref 0.0–0.1)
Basophils Relative: 0 %
Eosinophils Absolute: 0.1 10*3/uL (ref 0.0–0.5)
Eosinophils Relative: 1 %
HCT: 38.5 % (ref 36.0–46.0)
Hemoglobin: 13 g/dL (ref 12.0–15.0)
Immature Granulocytes: 1 %
Lymphocytes Relative: 35 %
Lymphs Abs: 2.1 10*3/uL (ref 0.7–4.0)
MCH: 30.9 pg (ref 26.0–34.0)
MCHC: 33.8 g/dL (ref 30.0–36.0)
MCV: 91.4 fL (ref 80.0–100.0)
Monocytes Absolute: 0.4 10*3/uL (ref 0.1–1.0)
Monocytes Relative: 7 %
Neutro Abs: 3.4 10*3/uL (ref 1.7–7.7)
Neutrophils Relative %: 56 %
Platelets: 240 10*3/uL (ref 150–400)
RBC: 4.21 MIL/uL (ref 3.87–5.11)
RDW: 12.5 % (ref 11.5–15.5)
WBC: 6.1 10*3/uL (ref 4.0–10.5)
nRBC: 0.3 % — ABNORMAL HIGH (ref 0.0–0.2)

## 2022-12-20 LAB — COMPREHENSIVE METABOLIC PANEL
ALT: 27 U/L (ref 0–44)
AST: 28 U/L (ref 15–41)
Albumin: 4.1 g/dL (ref 3.5–5.0)
Alkaline Phosphatase: 67 U/L (ref 38–126)
Anion gap: 11 (ref 5–15)
BUN: 12 mg/dL (ref 6–20)
CO2: 23 mmol/L (ref 22–32)
Calcium: 8.9 mg/dL (ref 8.9–10.3)
Chloride: 103 mmol/L (ref 98–111)
Creatinine, Ser: 0.78 mg/dL (ref 0.44–1.00)
GFR, Estimated: >60 mL/min (ref >60)
Glucose, Bld: 100 mg/dL — ABNORMAL HIGH (ref 70–99)
Potassium: 4.2 mmol/L (ref 3.5–5.1)
Sodium: 137 mmol/L (ref 135–145)
Total Bilirubin: 0.6 mg/dL (ref 0.3–1.2)
Total Protein: 7.5 g/dL (ref 6.5–8.1)

## 2022-12-20 LAB — URINALYSIS, ROUTINE W REFLEX MICROSCOPIC
Bilirubin Urine: NEGATIVE
Glucose, UA: NEGATIVE mg/dL
Hgb urine dipstick: NEGATIVE
Ketones, ur: NEGATIVE mg/dL
Leukocytes,Ua: NEGATIVE
Nitrite: NEGATIVE
Protein, ur: NEGATIVE mg/dL
Specific Gravity, Urine: 1.004 — ABNORMAL LOW (ref 1.005–1.030)
pH: 6 (ref 5.0–8.0)

## 2022-12-20 LAB — LIPASE, BLOOD: Lipase: 33 U/L (ref 11–51)

## 2022-12-20 MED ORDER — KETOROLAC TROMETHAMINE 30 MG/ML IJ SOLN
30.0000 mg | Freq: Once | INTRAMUSCULAR | Status: AC
  Administered 2022-12-20: 30 mg via INTRAVENOUS

## 2022-12-20 MED ORDER — LACTATED RINGERS IV BOLUS
1000.0000 mL | Freq: Once | INTRAVENOUS | Status: AC
  Administered 2022-12-20: 1000 mL via INTRAVENOUS

## 2022-12-20 MED ORDER — ONDANSETRON HCL 4 MG/2ML IJ SOLN
4.0000 mg | Freq: Once | INTRAMUSCULAR | Status: AC
  Administered 2022-12-20: 4 mg via INTRAVENOUS

## 2022-12-20 NOTE — ED Triage Notes (Signed)
Pt has hx pancreatic insufficiency and liver disease, pt states she hasn't been able to eat or drink since Tuesday. Pt c/o upper abd pain and n/v/d. Pt states she is also having lower right back pain and dark urine.

## 2022-12-20 NOTE — ED Provider Notes (Signed)
   [facility] Provider Note    Event Date/Time   First MD Initiated Contact with Patient 12/20/22 1355     (approximate)  History   Chief Complaint: Abdominal Pain  HPI  [name] is a 47 y.o. female  with a pmhx of gerd, ibs, pancreatic insufficiency, who presents to the ED with diarrhea, headache and weakness.  Pt states for the past 5 days or so she has been experiencing intermittent diarrhea.  1 episode of vomiting this morning.   States intermittent abd pain, although not currently.  Does state dark urine w/o dysuria or bleeding.   Physical Exam   Triage Vital Signs: ED Triage Vitals [12/20/22 1146]  Encounter Vitals Group     BP (!) 153/103     Systolic BP Percentile      Diastolic BP Percentile      Pulse Rate 90     Resp 16     Temp 98.8 F (37.1 C)     Temp Source Oral     SpO2 99 %     Weight 199 lb (90.3 kg)     Height 5\' 7"  (1.702 m)     Head Circumference      Peak Flow      Pain Score 8     Pain Loc      Pain Education      Exclude from Growth Chart     Most recent vital signs: Vitals:   12/20/22 1146  BP: (!) 153/103  Pulse: 90  Resp: 16  Temp: 98.8 F (37.1 C)  SpO2: 99%    General: Awake, no distress.  CV:  Good peripheral perfusion.  Regular rate and rhythm  Resp:  Normal effort.  Equal breath sounds bilaterally.  Abd:  No distention.  Soft, nontender.  No rebound or guarding.  ED Results / Procedures / Treatments    MEDICATIONS ORDERED IN ED: Medications  ondansetron (ZOFRAN) injection 4 mg (has no administration in time range)  lactated ringers bolus 1,000 mL (has no administration in time range)  ketorolac (TORADOL) 30 MG/ML injection 30 mg (has no administration in time range)     IMPRESSION / MDM / ASSESSMENT AND PLAN / ED COURSE  I reviewed the triage vital signs and the nursing notes.  Patient's presentation is most consistent with acute presentation with potential threat to life or bodily  function.  Pt with n/v today, 5d of diarrhea, intermittent pains.  Feels dehydrated.  Reassuring physical exam. No abd TTP.  Normal cbc, normal chem, normal lipase. Given the recent diarrhea and nausea, we will IV hydrate and treat with zofran.  We will obtain a UA and continue to closely monitor.   Patient's workup is reassuring, CBC is normal, chemistry is normal, LFTs are normal, lipase is normal.  Urinalysis is normal.  Patient is feeling better after fluids.  Will discharge the patient home with nausea medication supportive care and have the patient follow-up with her doctor for recheck/reevaluation.  Patient agreeable to plan of care.  FINAL CLINICAL IMPRESSION(S) / ED DIAGNOSES   Diarrhea, Dehydration  Note:  This document was prepared using Dragon voice recognition software and may include unintentional dictation errors.   Minna Antis, MD 12/20/22 1816

## 2022-12-20 NOTE — ED Notes (Signed)
This RN to bedside to reassess pt. Pt. States she feels better, she requests graham crackers and states that she has been able to eat some food and it has felt ok. Pt. Provided graham crackers. Pt. States she feels like she is ready/able to go home now. Dr. Lenard Lance notified, and updated on pt's condition.

## 2022-12-30 ENCOUNTER — Telehealth: Payer: Self-pay | Admitting: Family Medicine

## 2022-12-30 NOTE — Telephone Encounter (Signed)
[name] with -Dr. Gregery Na office is calling in because she faxed some office notes and was made aware they weren't received. [name] says she will hand deliver the notes sometime this week since the office is close to where she works.

## 2022-12-30 NOTE — Telephone Encounter (Signed)
There is a office note under media from 12/17/2022.  Tried to call the office to verify if this is all we were supposed to receive but they are at lunch.  Will call back after they return.

## 2022-12-30 NOTE — Telephone Encounter (Signed)
I spoke with [name] from Dr. Gregery Na office and she states that the office note that is in media from 12/17/2022 is the note that she was talking about.  She states the pt only came once.  She is also going to fax over lab results today.

## 2023-01-13 ENCOUNTER — Other Ambulatory Visit: Payer: Self-pay

## 2023-01-17 ENCOUNTER — Other Ambulatory Visit: Payer: Self-pay | Admitting: Gastroenterology

## 2023-01-22 ENCOUNTER — Encounter: Payer: Self-pay | Admitting: Gastroenterology

## 2023-01-22 ENCOUNTER — Ambulatory Visit (INDEPENDENT_AMBULATORY_CARE_PROVIDER_SITE_OTHER): Payer: 59 | Admitting: Gastroenterology

## 2023-01-22 VITALS — BP 139/85 | HR 84 | Temp 98.1°F | Ht 66.0 in | Wt 191.0 lb

## 2023-01-22 DIAGNOSIS — R197 Diarrhea, unspecified: Secondary | ICD-10-CM | POA: Diagnosis not present

## 2023-01-22 DIAGNOSIS — K5900 Constipation, unspecified: Secondary | ICD-10-CM | POA: Diagnosis not present

## 2023-01-22 DIAGNOSIS — K7581 Nonalcoholic steatohepatitis (NASH): Secondary | ICD-10-CM | POA: Diagnosis not present

## 2023-01-22 DIAGNOSIS — F10239 Alcohol dependence with withdrawal, unspecified: Secondary | ICD-10-CM

## 2023-01-22 DIAGNOSIS — K8681 Exocrine pancreatic insufficiency: Secondary | ICD-10-CM

## 2023-01-22 NOTE — Progress Notes (Signed)
[name], MD [address]  [address]  [place], [place] [address]  Main: [phone]  Fax: [phone]    Gastroenterology Consultation  Referring Provider:     [name], FNP Primary Care Physician:  [name], FNP Primary Gastroenterologist:  Dr. [name] Reason for Consultation: Constipation, NASH        HPI:   [name] is a 47 y.o. female referred by [name], FNP  for consultation & management of upper abdominal pain, elevated LFTs.  Patient was admitted to [facility] first week of January secondary to fever, headache, elevated FTs.  She was tested positive for influenza and was given Tamiflu for 5 days.  She did have elevated transaminases and alkaline phosphatase.  Total bilirubin was normal.  She underwent extensive secondary liver disease workup which was all negative.  Imaging revealed fatty liver only.  Serum lipase levels were normal is also been experiencing intermittent early satiety, abdominal bloating and the symptoms are most postprandial.  With subjective fevers, nausea as well.  She denies any black stools or rectal bleeding.  Patient used to drink heavily prior to hospital admission.  She was taking alternating Tylenol and NSAID 2 days prior to hospital admission every 3 hours.  Follow-up visit 09/23/2022 [name] is here for follow-up of diarrhea without any rectal bleeding that has occurred last week, this has started after she had pizza, associated with nausea and abdominal bloating.  Currently, her diarrhea is improving.  She was also treated for H. pylori in the past, confirmed eradication by H. pylori breath test.  She continues to smoke tobacco She had workup for elevated LFTs, liver biopsy revealed fatty liver only.  Her LFTs have currently improved  Follow-up visit 01/22/2023 [name] is here for follow-up of NASH and constipation.  She has been doing remarkably well with regards to her fatty liver, her LFTs have been  normal, she lost about 10 pounds by following healthy diet and exercise.  Her pancreatic fecal elastase levels were low when she had diarrhea, pancreatic enzymes led to severe constipation.  Therefore, these have been discontinued and she was started on Linzess 290 mcg daily.  Linzess leads to 3-4 episodes of diarrhea in the morning and she takes Linzess when she does not have a bowel movement for 2 days or more.  She would like to know about the status of pancreatic insufficiency whether or not she has to restart taking pancreatic enzymes.  She does not have any other GI concerns today  NSAIDs: None  Antiplts/Anticoagulants/Anti thrombotics: None  GI Procedures:  Upper endoscopy 06/25/2022 Normal duodenal bulb, second portion of duodenum and third portion of duodenum, biopsied Antral erythema, biopsied Normal esophagus and GE junction DIAGNOSIS:  A. DUODENUM; COLD BIOPSY:  - MILD FOCAL PEPTIC DUODENITIS.  - PRESERVED VILLOUS ARCHITECTURE.  - NEGATIVE FOR FEATURES OF CELIAC, DYSPLASIA, AND MALIGNANCY.   B. STOMACH; COLD BIOPSY:  - MILD TO MODERATE CHRONIC ACTIVE GASTRITIS.  - IMMUNOCHEMICAL STAINS FOR HELICOBACTER PYLORI SHOW FOCAL ORGANISMS OF  H. PYLORI (SEE COMMENT)  - PATCHY INTESTINAL METAPLASIA IS PRESENT.  - NEGATIVE FOR DYSPLASIA AND MALIGNANCY.    Colonoscopy 11/01/2022 - Three 5 to 8 mm polyps in the transverse colon, removed with a cold snare. Resected and retrieved. - One 12 mm polyp in the transverse colon, removed with a hot snare. Resected and retrieved. Clip manufacturer: [employer]. Clip ( MR safe) was placed. - The examined portion of  the ileum was normal. Biopsied. - Normal mucosa in the entire examined colon. Biopsied. - The distal rectum and anal verge are normal on retroflexion view. - Non- bleeding external hemorrhoids. Pathology revealed tubular adenoma Terminal ileal biopsies were normal Random colon biopsies were normal  Past Medical History:  Diagnosis  Date   BRCA negative 07/2018   MyRisk neg   Family history of breast cancer    IBIS=13%/riskscore=8.4%   Family history of ovarian cancer    Family history of pancreatic cancer    GERD (gastroesophageal reflux disease)    History of frequent urinary tract infections    Hypoglycemia    IBS (irritable bowel syndrome)    Migraine headache    linger for days when they happen   Vertigo    none for several years    Past Surgical History:  Procedure Laterality Date   ABDOMINAL HYSTERECTOMY  07/2006   PARTIAL   APPENDECTOMY     BACK SURGERY     CHOLECYSTECTOMY     COLONOSCOPY WITH PROPOFOL N/A 11/01/2022   Procedure: COLONOSCOPY WITH PROPOFOL;  Surgeon: Toney Reil, MD;  Location: [facility];  Service: Gastroenterology;  Laterality: N/A;   DIAGNOSTIC LAPAROSCOPY     test on bladder   ESOPHAGOGASTRODUODENOSCOPY (EGD) WITH PROPOFOL N/A 06/25/2022   Procedure: ESOPHAGOGASTRODUODENOSCOPY (EGD) WITH PROPOFOL;  Surgeon: Toney Reil, MD;  Location: [facility];  Service: Endoscopy;  Laterality: N/A;   GALLBLADDER SURGERY     LAPAROSCOPIC APPENDECTOMY N/A 12/08/2016   Procedure: APPENDECTOMY LAPAROSCOPIC;  Surgeon: Leafy Ro, MD;  Location: [facility] ORS;  Service: General;  Laterality: N/A;   MAXIMUM ACCESS (MAS)POSTERIOR LUMBAR INTERBODY FUSION (PLIF) 1 LEVEL N/A 05/06/2013   Procedure: LUMBAR FIVE-SACRAL ONE MAXIMUM ACCESS POSTERIOR LUMBAR INTERBODY FUSION;  Surgeon: Tia Alert, MD;  Location: [facility];  Service: Neurosurgery;  Laterality: N/A;   TOE SURGERY     TUBAL LIGATION     WISDOM TOOTH EXTRACTION       Current Outpatient Medications:    Blood Glucose Monitoring Suppl DEVI, 1 each by Does not apply route 2 (two) times daily as needed. May substitute to any manufacturer covered by patient's insurance., Disp: 1 each, Rfl: 0   Cholecalciferol (VITAMIN D3) 50 MCG (2000 UT) CAPS, Take 1 capsule (2,000 Units total) by mouth daily., Disp: 30 capsule,  Rfl: 4   DOTTI 0.05 MG/24HR patch, Place 1 patch onto the skin 2 (two) times a week., Disp: , Rfl:    FLUoxetine (PROZAC) 40 MG capsule, Take 1 capsule (40 mg total) by mouth daily., Disp: 90 capsule, Rfl: 3   fluticasone (FLONASE) 50 MCG/ACT nasal spray, Place 2 sprays into both nostrils daily., Disp: 16 g, Rfl: 0   hydrOXYzine (ATARAX) 25 MG tablet, TAKE 2 TABLETS BY MOUTH EVERY 8 HOURS AS NEEDED FOR  ITCHING,  AND  1-2  TABS  AT  BEDTIME, Disp: 60 tablet, Rfl: 0   linaclotide (LINZESS) 290 MCG CAPS capsule, Take 1 capsule (290 mcg total) by mouth daily before breakfast., Disp: 30 capsule, Rfl: 1   nicotine polacrilex (NICOTINE MINI) 2 MG lozenge, Take 1 lozenge (2 mg total) by mouth every 2 (two) hours as needed for smoking cessation., Disp: 72 lozenge, Rfl: 3   nortriptyline (PAMELOR) 10 MG capsule, Take nortriptyline 10 mg at night for one week then increase to 20 mg at night and continue this dose., Disp: , Rfl:    omeprazole (PRILOSEC) 40 MG capsule, Take 1 capsule  by mouth once daily, Disp: 30 capsule, Rfl: 2   ondansetron (ZOFRAN) 4 MG tablet, Take 4 mg by mouth every 8 (eight) hours as needed., Disp: , Rfl:    Pancrelipase, Lip-Prot-Amyl, (ZENPEP) 40000-126000 units CPEP, Take 1 capsules with the first bite of each meal and 1 capsule with the first bite of each snack, Disp: 150 capsule, Rfl: 0   phenazopyridine (PYRIDIUM) 100 MG tablet, Take 1 tablet (100 mg total) by mouth 3 (three) times daily as needed for pain., Disp: 10 tablet, Rfl: 0   progesterone (PROMETRIUM) 100 MG capsule, Take 100 mg by mouth at bedtime., Disp: , Rfl:    promethazine (PHENERGAN) 12.5 MG tablet, Take 1 tablet (12.5 mg total) by mouth every 8 (eight) hours as needed for nausea or vomiting., Disp: 30 tablet, Rfl: 0   rizatriptan (MAXALT-MLT) 5 MG disintegrating tablet, Take 5 mg by mouth as needed., Disp: , Rfl:    rosuvastatin (CRESTOR) 10 MG tablet, Take 1 tablet (10 mg total) by mouth daily., Disp: 90 tablet, Rfl:  3   traZODone (DESYREL) 100 MG tablet, Take 1-2 tablets PO as needed to assist with sleep, Disp: 180 tablet, Rfl: 3   valACYclovir (VALTREX) 500 MG tablet, Take 2 tablets (1,000 mg total) by mouth 2 (two) times daily., Disp: 20 tablet, Rfl: 11   Vitamin D, Ergocalciferol, (DRISDOL) 1.25 MG (50000 UNIT) CAPS capsule, Take 1 capsule by mouth once a week, Disp: 12 capsule, Rfl: 0   Family History  Problem Relation Age of Onset   Hypertension Mother    COPD Mother    Epilepsy Mother    Sleep apnea Mother    Hypertension Father    Sleep apnea Father    Breast cancer Maternal Aunt 43   Ovarian cancer Maternal Aunt 45   Colon cancer Paternal Uncle 56   Pancreatic cancer Maternal Grandmother 16   Colon cancer Maternal Grandfather 32     Social History   Tobacco Use   Smoking status: Every Day    Current packs/day: 0.75    Average packs/day: 0.8 packs/day for 30.0 years (22.5 ttl pk-yrs)    Types: Cigarettes   Smokeless tobacco: Never  Vaping Use   Vaping status: Some Days  Substance Use Topics   Alcohol use: Yes    Comment: 2-6 beers daily, down from 12+   Drug use: No    Allergies as of 01/22/2023 - Review Complete 01/22/2023  Allergen Reaction Noted   Codeine Nausea And Vomiting 03/10/2013   Sulfa antibiotics Rash 03/10/2013   Vicodin [hydrocodone-acetaminophen] Nausea Only 03/12/2013    Review of Systems:    All systems reviewed and negative except where noted in HPI.   Physical Exam:  BP 139/85   Pulse 84   Temp 98.1 F (36.7 C) (Oral)   Ht 5\' 6"  (1.676 m)   Wt 191 lb (86.6 kg)   BMI 30.83 kg/m  No LMP recorded. Patient has had a hysterectomy.  General:   Alert,  Well-developed, well-nourished, pleasant and cooperative in NAD Head:  Normocephalic and atraumatic. Eyes:  Sclera clear, no icterus.   Conjunctiva pink. Ears:  Normal auditory acuity. Nose:  No deformity, discharge, or lesions. Mouth:  No deformity or lesions,oropharynx pink & moist. Neck:   Supple; no masses or thyromegaly. Lungs:  Respirations even and unlabored.  Clear throughout to auscultation.   No wheezes, crackles, or rhonchi. No acute distress. Heart:  Regular rate and rhythm; no murmurs, clicks, rubs, or gallops. Abdomen:  Normal bowel sounds. Soft, nontender, nondistended without masses, hepatosplenomegaly or hernias noted.  No guarding or rebound tenderness.   Rectal: Not performed Msk:  Symmetrical without gross deformities. Good, equal movement & strength bilaterally. Pulses:  Normal pulses noted. Extremities:  No clubbing or edema.  No cyanosis. Neurologic:  Alert and oriented x3;  grossly normal neurologically. Skin:  Intact without significant lesions or rashes. No jaundice. Psych:  Alert and cooperative. Normal mood and affect.  Imaging Studies: Reviewed  Assessment and Plan:   Jamia Abaya is a 47 y.o.  female with history of alcohol use, tobacco use, s/p cholecystectomy, fatty liver, biopsy-proven, history of H. pylori infection s/p treatment and confirmed eradication is seen in consultation for constipation and NASH.  Patient has stopped drinking alcohol   Constipation Since high-dose Linzess is causing diarrhea, will try 145 mcg daily, samples provided, patient will update me via MyChart  Low pancreatic fecal elastase Recheck levels  Elevated LFTs: Significantly improved after abstinence from alcohol use Most recent LFTs from 12/2022 were normal Liver biopsy confirmed NASH Secondary liver disease workup is negative Reiterated on healthy eating habits and regular exercise   Follow up as needed  [name], MD

## 2023-01-22 NOTE — Patient Instructions (Signed)
Lease take Linzess 145 mcg daily 30 minutes before breakfast. After a week, please let us know so we could send a prescription for you.

## 2023-01-23 ENCOUNTER — Other Ambulatory Visit: Payer: Self-pay | Admitting: Gastroenterology

## 2023-01-27 LAB — PANCREATIC ELASTASE, FECAL: Pancreatic Elastase, Fecal: 47 ug Elast./g — ABNORMAL LOW (ref >200)

## 2023-01-27 NOTE — Telephone Encounter (Signed)
Last office visit 01/22/2023 01/22/2023 Diarrhea  Last refill 11/22/2022 1 refills 30 capsules   Constipation Since high-dose Linzess is causing diarrhea, will try 145 mcg daily, samples provided, patient will update me via MyChart

## 2023-01-28 ENCOUNTER — Encounter: Payer: Self-pay | Admitting: Gastroenterology

## 2023-01-28 NOTE — Telephone Encounter (Signed)
No we do no have any samples of low does Creon the only samples we have for creon is 36000

## 2023-01-29 MED ORDER — LINACLOTIDE 145 MCG PO CAPS: 145.0000 ug | ORAL_CAPSULE | Freq: Every day | ORAL | 1 refills | Status: DC

## 2023-01-29 MED ORDER — CREON 24000-76000 UNITS PO CPEP: ORAL_CAPSULE | ORAL | 0 refills | Status: DC

## 2023-01-29 NOTE — Telephone Encounter (Signed)
Her insurance has paid for creon and Zenpep she has taken both

## 2023-02-12 ENCOUNTER — Emergency Department: Payer: 59

## 2023-02-12 ENCOUNTER — Other Ambulatory Visit: Payer: Self-pay

## 2023-02-12 ENCOUNTER — Emergency Department
Admission: EM | Admit: 2023-02-12 | Discharge: 2023-02-12 | Disposition: A | Discharge status: Home / Self Care | Payer: 59 | Attending: Emergency Medicine | Admitting: Emergency Medicine

## 2023-02-12 DIAGNOSIS — R079 Chest pain, unspecified: Secondary | ICD-10-CM | POA: Diagnosis present

## 2023-02-12 DIAGNOSIS — R1013 Epigastric pain: Secondary | ICD-10-CM | POA: Insufficient documentation

## 2023-02-12 DIAGNOSIS — R0789 Other chest pain: Secondary | ICD-10-CM | POA: Diagnosis not present

## 2023-02-12 DIAGNOSIS — R110 Nausea: Secondary | ICD-10-CM | POA: Insufficient documentation

## 2023-02-12 LAB — CBC
HCT: 44.1 % (ref 36.0–46.0)
Hemoglobin: 14.5 g/dL (ref 12.0–15.0)
MCH: 31 pg (ref 26.0–34.0)
MCHC: 32.9 g/dL (ref 30.0–36.0)
MCV: 94.4 fL (ref 80.0–100.0)
Platelets: 257 10*3/uL (ref 150–400)
RBC: 4.67 MIL/uL (ref 3.87–5.11)
RDW: 12.8 % (ref 11.5–15.5)
WBC: 9.1 10*3/uL (ref 4.0–10.5)
nRBC: 0 % (ref 0.0–0.2)

## 2023-02-12 LAB — BASIC METABOLIC PANEL
Anion gap: 11 (ref 5–15)
BUN: 8 mg/dL (ref 6–20)
CO2: 24 mmol/L (ref 22–32)
Calcium: 9.2 mg/dL (ref 8.9–10.3)
Chloride: 101 mmol/L (ref 98–111)
Creatinine, Ser: 0.83 mg/dL (ref 0.44–1.00)
GFR, Estimated: >60 mL/min (ref >60)
Glucose, Bld: 93 mg/dL (ref 70–99)
Potassium: 4.5 mmol/L (ref 3.5–5.1)
Sodium: 136 mmol/L (ref 135–145)

## 2023-02-12 LAB — TROPONIN I (HIGH SENSITIVITY)
Troponin I (High Sensitivity): 3 ng/L (ref <18)
Troponin I (High Sensitivity): <2 ng/L (ref <18)

## 2023-02-12 LAB — HEPATIC FUNCTION PANEL
ALT: 32 U/L (ref 0–44)
AST: 34 U/L (ref 15–41)
Albumin: 4.2 g/dL (ref 3.5–5.0)
Alkaline Phosphatase: 70 U/L (ref 38–126)
Bilirubin, Direct: 0.2 mg/dL (ref 0.0–0.2)
Indirect Bilirubin: 0.2 mg/dL — ABNORMAL LOW (ref 0.3–0.9)
Total Bilirubin: 0.4 mg/dL (ref 0.3–1.2)
Total Protein: 7.8 g/dL (ref 6.5–8.1)

## 2023-02-12 LAB — LIPASE, BLOOD: Lipase: 51 U/L (ref 11–51)

## 2023-02-12 MED ORDER — DROPERIDOL 2.5 MG/ML IJ SOLN
2.5000 mg | Freq: Once | INTRAMUSCULAR | Status: AC
  Administered 2023-02-12: 2.5 mg via INTRAVENOUS
  Filled 2023-02-12: qty 2

## 2023-02-12 MED ORDER — SUCRALFATE 1 G PO TABS: 1.0000 g | ORAL_TABLET | Freq: Four times a day (QID) | ORAL | 0 refills | Status: DC

## 2023-02-12 MED ORDER — FAMOTIDINE IN NACL 20-0.9 MG/50ML-% IV SOLN
20.0000 mg | Freq: Once | INTRAVENOUS | Status: AC
  Administered 2023-02-12: 20 mg via INTRAVENOUS
  Filled 2023-02-12: qty 50

## 2023-02-12 MED ORDER — PANTOPRAZOLE SODIUM 40 MG PO TBEC: 40.0000 mg | DELAYED_RELEASE_TABLET | Freq: Every day | ORAL | 0 refills | Status: DC

## 2023-02-12 MED ORDER — ALUM & MAG HYDROXIDE-SIMETH 200-200-20 MG/5ML PO SUSP
30.0000 mL | Freq: Once | ORAL | Status: AC
  Administered 2023-02-12: 30 mL via ORAL
  Filled 2023-02-12: qty 30

## 2023-02-12 NOTE — ED Provider Notes (Signed)
[facility] Provider Note    Event Date/Time   First MD Initiated Contact with Patient 02/12/23 1333     (approximate)   History   Chest Pain   HPI  [name] is a 47 y.o. female here with chest pain.  The patient states that over the last day, she has had upper chest and bilateral shoulder pain that radiates down her arms.  She has had some mild associated nausea.  Symptoms are fairly constant but do seem worse with eating occasionally.  She has had no fevers or chills.  No vomiting but does feel nauseous.  No diarrhea.  No known personal or family history of cardiac disease.     Physical Exam   Triage Vital Signs: ED Triage Vitals  Encounter Vitals Group     BP 02/12/23 1327 135/79     Systolic BP Percentile --      Diastolic BP Percentile --      Pulse Rate 02/12/23 1327 84     Resp 02/12/23 1327 16     Temp 02/12/23 1327 98.8 F (37.1 C)     Temp src --      SpO2 02/12/23 1327 99 %     Weight --      Height --      Head Circumference --      Peak Flow --      Pain Score 02/12/23 1325 8     Pain Loc --      Pain Education --      Exclude from Growth Chart --     Most recent vital signs: Vitals:   02/12/23 1327  BP: 135/79  Pulse: 84  Resp: 16  Temp: 98.8 F (37.1 C)  SpO2: 99%     General: Awake, no distress.  CV:  Good peripheral perfusion.  Regular rate and rhythm.  No murmurs. Resp:  Normal work of breathing.  Lungs clear to auscultation bilaterally.  No chest wall tenderness. Abd:  No distention.  Minimal epigastric discomfort.  No guarding or rebound. Other:  Moist mucous membranes.   ED Results / Procedures / Treatments   Labs (all labs ordered are listed, but only abnormal results are displayed) Labs Reviewed  HEPATIC FUNCTION PANEL - Abnormal; Notable for the following components:      Result Value   Indirect Bilirubin 0.2 (*)    All other components within normal limits  BASIC METABOLIC PANEL  CBC   LIPASE, BLOOD  TROPONIN I (HIGH SENSITIVITY)  TROPONIN I (HIGH SENSITIVITY)     EKG Normal sinus rhythm, ventricular rate 80.  PR 138, QRS 80, QTc 452.  No acute ST elevations or depression for [place] with acute ischemia or infarct.   RADIOLOGY Chest x-ray: No active disease   I also independently reviewed and agree with radiologist interpretations.   PROCEDURES:  Critical Care performed: No   MEDICATIONS ORDERED IN ED: Medications  alum & mag hydroxide-simeth (MAALOX/MYLANTA) 200-200-20 MG/5ML suspension 30 mL (30 mLs Oral Given 02/12/23 1420)  droperidol (INAPSINE) 2.5 MG/ML injection 2.5 mg (2.5 mg Intravenous Given 02/12/23 1420)  famotidine (PEPCID) IVPB 20 mg premix (20 mg Intravenous New Bag/Given 02/12/23 1427)     IMPRESSION / MDM / ASSESSMENT AND PLAN / ED COURSE  I reviewed the triage vital signs and the nursing notes.  Differential diagnosis includes, but is not limited to, atypical chest pain, GERD/gastritis, esophagitis, pancreatitis, pneumonia, pneumothorax, ACS  Patient's presentation is most consistent with acute presentation with potential threat to life or bodily function.  The patient is on the cardiac monitor to evaluate for evidence of arrhythmia and/or significant heart rate changes   47 year old female here with atypical upper chest pain.  Suspect possible upper GI versus anxiety related pain.  She has normal EKG with initial troponin that is negative, which is reassuring.  Will plan to repeat troponin.  She is a low risk heart score.  Chest x-ray is clear, no evidence of pneumothorax or other abnormalities.  LFTs are normal, she is status post cholecystectomy.  Lipase normal, no signs of pancreatitis.  Abdomen is otherwise soft.  BMP is unremarkable.  CBC without leukocytosis or anemia.  Plan to treat as likely upper GI related pain, discharge if troponin is negative.  Patient updated and is agreeable this  plan.   FINAL CLINICAL IMPRESSION(S) / ED DIAGNOSES   Final diagnoses:  Atypical chest pain     Rx / DC Orders   ED Discharge Orders          Ordered    pantoprazole (PROTONIX) 40 MG tablet  Daily        02/12/23 1523    sucralfate (CARAFATE) 1 g tablet  4 times daily        02/12/23 1523             Note:  This document was prepared using Dragon voice recognition software and may include unintentional dictation errors.   Shaune Pollack, MD 02/12/23 [phone]

## 2023-02-12 NOTE — ED Triage Notes (Signed)
Pt to ED for chest pressure radiating to bilateral arms for the past few days. +shob, nausea

## 2023-02-12 NOTE — ED Notes (Signed)
Patient given graham crackers and water 

## 2023-02-23 ENCOUNTER — Other Ambulatory Visit: Payer: Self-pay | Admitting: Gastroenterology

## 2023-02-24 NOTE — Telephone Encounter (Signed)
Last office visit 01/22/2023 diarrhea  Last refill 01/29/2023 0 refills

## 2023-02-26 ENCOUNTER — Other Ambulatory Visit: Payer: Self-pay | Admitting: Family Medicine

## 2023-02-26 DIAGNOSIS — Z1231 Encounter for screening mammogram for malignant neoplasm of breast: Secondary | ICD-10-CM

## 2023-03-13 ENCOUNTER — Encounter: Payer: Self-pay | Admitting: Physician Assistant

## 2023-03-13 ENCOUNTER — Ambulatory Visit: Payer: Self-pay | Admitting: *Deleted

## 2023-03-13 ENCOUNTER — Ambulatory Visit (INDEPENDENT_AMBULATORY_CARE_PROVIDER_SITE_OTHER): Payer: 59 | Admitting: Physician Assistant

## 2023-03-13 VITALS — BP 132/84 | HR 101 | Resp 16 | Ht 66.0 in | Wt 184.6 lb

## 2023-03-13 DIAGNOSIS — B0230 Zoster ocular disease, unspecified: Secondary | ICD-10-CM

## 2023-03-13 DIAGNOSIS — H5710 Ocular pain, unspecified eye: Secondary | ICD-10-CM

## 2023-03-13 NOTE — Telephone Encounter (Signed)
Reason for Disposition  [1] Looks infected (spreading redness, pus) AND [2] no fever  Answer Assessment - Initial Assessment Questions 1. APPEARANCE of RASH: "Describe the rash."      Bumps in scaly, dry rash 2. LOCATION: "Where is the rash located?"      Surrounding the left eye- moving toward cheek 3. NUMBER: "How many spots are there?"      1-2- bumps look blistery 4. SIZE: "How big are the spots?" (Inches, centimeters or compare to size of a coin)      Pencil lead size- other looks like cluster- shalf size of pencil eraser 5. ONSET: "When did the rash start?"      Started Monday 6. ITCHING: "Does the rash itch?" If Yes, ask: "How bad is the itch?"  (Scale 0-10; or none, mild, moderate, severe)     Irritated- slight 7. PAIN: "Does the rash hurt?" If Yes, ask: "How bad is the pain?"  (Scale 0-10; or none, mild, moderate, severe)    - NONE (0): no pain    - MILD (1-3): doesn't interfere with normal activities     - MODERATE (4-7): interferes with normal activities or awakens from sleep    - SEVERE (8-10): excruciating pain, unable to do any normal activities     Hurts- moderate-6/10 8. OTHER SYMPTOMS: "Do you have any other symptoms?" (e.g., fever)     Chills but not feverish  Protocols used: Rash or Redness - Localized-A-AH

## 2023-03-13 NOTE — Telephone Encounter (Signed)
  Chief Complaint: rash surrounding eye Symptoms: rash surrounding left eye, painful, scaly,blistery- starting to spread to cheek Frequency: stared Monday Pertinent Negatives: Patient denies redness in the eye Disposition: [] ED /[] Urgent Care (no appt availability in office) / [x] Appointment(In office/virtual)/ []  [facility]/ [] Home Care/ [] Refused Recommended Disposition /[] [facility]/ []  Follow-up with PCP Additional Notes: Patient stated she had UC/VV yesterday(outside system) and was diagnosed with pink eye- given antibiotic drops- patient states her eye is not red- it is surrounding the eye and has started to move to cheek- painful. Patient has been scheduled with float provider

## 2023-03-13 NOTE — Progress Notes (Signed)
Acute Office Visit   Patient: [name]   DOB: November 24, 1975   47 y.o. Female  MRN: [id number] Visit Date: 03/13/2023  Today's healthcare provider: Oswaldo Conroy Alexxus Sobh, PA-C  Introduced myself to the patient as a Secondary school teacher and provided education on APPs in clinical practice.    Chief Complaint  Patient presents with   Rash    Started mon around left eye and hurts   Subjective    HPI HPI     Rash    Additional comments: Started mon around left eye and hurts      Last edited by Forde Radon, CMA on 03/13/2023  2:46 PM.       She reports pain around her left ocular orbit and left eye She was seen through virtual UC and was given abx for conjunctivitis but reports her pain is worse with the treatment  She reports this started Monday  She states pain is dull and achy but feels like pins and needles when touched She denies numbness or tingling on that side of her face, facial drooping or trouble speaking     Medications: Outpatient Medications Prior to Visit  Medication Sig   Blood Glucose Monitoring Suppl DEVI 1 each by Does not apply route 2 (two) times daily as needed. May substitute to any manufacturer covered by patient's insurance.   Cholecalciferol (VITAMIN D3) 50 MCG (2000 UT) CAPS Take 1 capsule (2,000 Units total) by mouth daily.   DOTTI 0.05 MG/24HR patch Place 1 patch onto the skin 2 (two) times a week.   FLUoxetine (PROZAC) 40 MG capsule Take 1 capsule (40 mg total) by mouth daily.   fluticasone (FLONASE) 50 MCG/ACT nasal spray Place 2 sprays into both nostrils daily.   hydrOXYzine (ATARAX) 25 MG tablet TAKE 2 TABLETS BY MOUTH EVERY 8 HOURS AS NEEDED FOR  ITCHING,  AND  1-2  TABS  AT  BEDTIME   linaclotide (LINZESS) 145 MCG CAPS capsule Take 1 capsule (145 mcg total) by mouth daily before breakfast.   nortriptyline (PAMELOR) 10 MG capsule Take nortriptyline 10 mg at night for one week then increase to 20 mg at night and continue this dose.    ondansetron (ZOFRAN) 4 MG tablet Take 4 mg by mouth every 8 (eight) hours as needed.   Pancrelipase, Lip-Prot-Amyl, (CREON) 24000-76000 units CPEP TAKE 1 CAPSULE BY MOUTH WITH THE FIRST BITE OF EACH MEAL AND 1 CAPSULE WITH THE FIRST BITE OF EACH SNACK   phenazopyridine (PYRIDIUM) 100 MG tablet Take 1 tablet (100 mg total) by mouth 3 (three) times daily as needed for pain.   progesterone (PROMETRIUM) 100 MG capsule Take 100 mg by mouth at bedtime.   promethazine (PHENERGAN) 12.5 MG tablet Take 1 tablet (12.5 mg total) by mouth every 8 (eight) hours as needed for nausea or vomiting.   rizatriptan (MAXALT-MLT) 5 MG disintegrating tablet Take 5 mg by mouth as needed.   rosuvastatin (CRESTOR) 10 MG tablet Take 1 tablet (10 mg total) by mouth daily.   traZODone (DESYREL) 100 MG tablet Take 1-2 tablets PO as needed to assist with sleep   valACYclovir (VALTREX) 500 MG tablet Take 2 tablets (1,000 mg total) by mouth 2 (two) times daily.   Vitamin D, Ergocalciferol, (DRISDOL) 1.25 MG (50000 UNIT) CAPS capsule Take 1 capsule by mouth once a week   pantoprazole (PROTONIX) 40 MG tablet Take 1 tablet (40 mg total) by mouth daily for 14 days.   sucralfate (  CARAFATE) 1 g tablet Take 1 tablet (1 g total) by mouth 4 (four) times daily for 7 days.   No facility-administered medications prior to visit.    Review of Systems  Eyes:  Positive for photophobia, pain, discharge (in the AM there is drainage in the corner of her eye. Denies matting), itching and visual disturbance (reports some blurry vision in left eye). Negative for redness.  Skin:  Negative for rash.  Neurological:  Positive for headaches (she does report a headache). Negative for dizziness, facial asymmetry, speech difficulty, light-headedness and numbness.        Objective    BP 132/84   Pulse (!) 101   Resp 16   Ht 5\' 6"  (1.676 m)   Wt 184 lb 9.6 oz (83.7 kg)   SpO2 98%   BMI 29.80 kg/m     Physical Exam Vitals reviewed.   Constitutional:      General: She is awake.     Appearance: Normal appearance. She is well-developed and well-groomed.  HENT:     Head: Normocephalic and atraumatic.  Eyes:     Extraocular Movements: Extraocular movements intact.     Right eye: Normal extraocular motion and no nystagmus.     Left eye: Normal extraocular motion and no nystagmus.     Pupils: Pupils are equal, round, and reactive to light.     Comments: There is notable swelling along the left lower eyelid along with potential early vesicle formation sweeping along the cheekbone During ocular exam patient winced with extraocular movement exam especially with downward testing.  EOMs appear to be intact but painful   Pulmonary:     Effort: Pulmonary effort is normal.  Skin:    General: Skin is warm and dry.  Neurological:     General: No focal deficit present.     Mental Status: She is alert and oriented to person, place, and time.  Psychiatric:        Mood and Affect: Mood normal.        Behavior: Behavior normal. Behavior is cooperative.        Thought Content: Thought content normal.        Judgment: Judgment normal.       No results found for any visits on 03/13/23.  Assessment & Plan      No follow-ups on file.       Problem List Items Addressed This Visit   None Visit Diagnoses     Herpes zoster with ophthalmic complication, unspecified herpes zoster eye disease    -  Primary Acute, new concern Patient reports pain along her left eye that started Monday and has not subsided even with recommended therapies from virtual UC visit Patient reports that she was seen virtually in UC earlier this week and was prescribed an antibiotic eye treatment but this seems to have made her eye pain worse. Physical exam is notable for swelling along the left lower eye and cheekbone.  There also appears to be early vesicle formation.  Likely consistent with herpes zoster infection Ocular exam is notable for pain with EOM  testing particularly with downward movement but EOMs are intact and symmetrical Given concern for ocular herpes zoster infection along with potential ocular nerve entrapment I recommend that she be seen in the emergency room for prompt ophthalmology evaluation and head imaging Patient agrees with recommendation and states that she will go to the emergency room after appointment concludes Office called [facility] to inform them of patient  travel Follow-up as needed after emergency room and ophthalmology evaluation   Pain on movement of eye            No follow-ups on file.   I, Macklin Jacquin E Mialynn Shelvin, PA-C, have reviewed all documentation for this visit. The documentation on 03/13/23 for the exam, diagnosis, procedures, and orders are all accurate and complete.   Jacquelin Hawking, MHS, PA-C [facility]

## 2023-03-20 ENCOUNTER — Ambulatory Visit: Payer: 59 | Admitting: Family Medicine

## 2023-03-20 ENCOUNTER — Encounter: Payer: Self-pay | Admitting: Family Medicine

## 2023-03-20 VITALS — BP 117/82 | HR 75 | Temp 97.2°F | Ht 66.0 in | Wt 189.5 lb

## 2023-03-20 DIAGNOSIS — M5432 Sciatica, left side: Secondary | ICD-10-CM | POA: Diagnosis not present

## 2023-03-20 DIAGNOSIS — S39012D Strain of muscle, fascia and tendon of lower back, subsequent encounter: Secondary | ICD-10-CM

## 2023-03-20 DIAGNOSIS — Z23 Encounter for immunization: Secondary | ICD-10-CM

## 2023-03-20 MED ORDER — CYCLOBENZAPRINE HCL 10 MG PO TABS: 10.0000 mg | ORAL_TABLET | Freq: Three times a day (TID) | ORAL | 0 refills | Status: DC | PRN

## 2023-03-20 MED ORDER — EZETIMIBE 10 MG PO TABS: 10.0000 mg | ORAL_TABLET | Freq: Every day | ORAL | 11 refills | Status: DC

## 2023-03-20 MED ORDER — GABAPENTIN 300 MG PO CAPS: 300.0000 mg | ORAL_CAPSULE | Freq: Every day | ORAL | 1 refills | Status: DC

## 2023-03-20 NOTE — Progress Notes (Signed)
Established patient visit   Patient: [name]   DOB: 1975/07/31   47 y.o. Female  MRN: [id number] Visit Date: 03/20/2023  Today's healthcare provider: [name], FNP  Introduced to nurse practitioner role and practice setting.  All questions answered.  Discussed provider/patient relationship and expectations.  Subjective    HPI HPI     Hospitalization Follow-up    Additional comments: Would like to discuss lower back pain and rosuvastatin       Last edited by Rolly Salter, CMA on 03/20/2023  9:23 AM.      The patient, with a history of sciatica and hyperlipidemia, presents for follow-up after a recent episode of Herpes Zoster. They report significant improvement in the eye and facial symptoms, with residual nerve pain in the cheek and surrounding area. They have been taking gabapentin, which has not only helped with the nerve pain but also significantly improved their sciatica. They deny any adverse effects from the gabapentin.  The patient also reports discontinuing rosuvastatin due to intolerable muscle aches and pains, particularly in the feet. They have been making lifestyle changes, including weight loss, to manage their hyperlipidemia. They also report high blood pressure readings at home, with numbers reaching up to 150/96, but deny any associated symptoms.  The patient continues to smoke and admits to occasional alcohol use, mainly a couple of beers on the weekends. They are planning a fishing trip next week and intend to limit their alcohol intake during the trip. They also report a recent back injury, resulting in a large knot and significant pain.  Medications: Outpatient Medications Prior to Visit  Medication Sig   Blood Glucose Monitoring Suppl DEVI 1 each by Does not apply route 2 (two) times daily as needed. May substitute to any manufacturer covered by patient's insurance.   DOTTI 0.05 MG/24HR patch Place 1 patch onto the skin 2 (two) times a week.    FLUoxetine (PROZAC) 40 MG capsule Take 1 capsule (40 mg total) by mouth daily.   linaclotide (LINZESS) 145 MCG CAPS capsule Take 1 capsule (145 mcg total) by mouth daily before breakfast.   nortriptyline (PAMELOR) 10 MG capsule Take nortriptyline 10 mg at night for one week then increase to 20 mg at night and continue this dose.   Pancrelipase, Lip-Prot-Amyl, (CREON) 24000-76000 units CPEP TAKE 1 CAPSULE BY MOUTH WITH THE FIRST BITE OF EACH MEAL AND 1 CAPSULE WITH THE FIRST BITE OF EACH SNACK   promethazine (PHENERGAN) 12.5 MG tablet Take 1 tablet (12.5 mg total) by mouth every 8 (eight) hours as needed for nausea or vomiting.   rizatriptan (MAXALT-MLT) 5 MG disintegrating tablet Take 5 mg by mouth as needed.   traZODone (DESYREL) 100 MG tablet Take 1-2 tablets PO as needed to assist with sleep   valACYclovir (VALTREX) 500 MG tablet Take 2 tablets (1,000 mg total) by mouth 2 (two) times daily.   [DISCONTINUED] Cholecalciferol (VITAMIN D3) 50 MCG (2000 UT) CAPS Take 1 capsule (2,000 Units total) by mouth daily.   [DISCONTINUED] fluticasone (FLONASE) 50 MCG/ACT nasal spray Place 2 sprays into both nostrils daily.   [DISCONTINUED] hydrOXYzine (ATARAX) 25 MG tablet TAKE 2 TABLETS BY MOUTH EVERY 8 HOURS AS NEEDED FOR  ITCHING,  AND  1-2  TABS  AT  BEDTIME   [DISCONTINUED] ondansetron (ZOFRAN) 4 MG tablet Take 4 mg by mouth every 8 (eight) hours as needed.   [DISCONTINUED] pantoprazole (PROTONIX) 40 MG tablet Take 1 tablet (40 mg total) by mouth  daily for 14 days.   [DISCONTINUED] phenazopyridine (PYRIDIUM) 100 MG tablet Take 1 tablet (100 mg total) by mouth 3 (three) times daily as needed for pain.   [DISCONTINUED] progesterone (PROMETRIUM) 100 MG capsule Take 100 mg by mouth at bedtime.   [DISCONTINUED] rosuvastatin (CRESTOR) 10 MG tablet Take 1 tablet (10 mg total) by mouth daily.   [DISCONTINUED] sucralfate (CARAFATE) 1 g tablet Take 1 tablet (1 g total) by mouth 4 (four) times daily for 7 days.    [DISCONTINUED] Vitamin D, Ergocalciferol, (DRISDOL) 1.25 MG (50000 UNIT) CAPS capsule Take 1 capsule by mouth once a week   No facility-administered medications prior to visit.     Objective    BP 117/82 (BP Location: Right Arm, Patient Position: Sitting, Cuff Size: Large)   Pulse 75   Temp (!) 97.2 F (36.2 C) (Oral)   Ht 5\' 6"  (1.676 m)   Wt 189 lb 8 oz (86 kg)   SpO2 97%   BMI 30.59 kg/m   Physical Exam Vitals and nursing note reviewed.  Constitutional:      General: She is not in acute distress.    Appearance: Normal appearance. She is obese. She is not ill-appearing, toxic-appearing or diaphoretic.  HENT:     Head: Normocephalic and atraumatic.      Comments: Area of tenderness; blister on eye is resolved.  Cardiovascular:     Rate and Rhythm: Normal rate and regular rhythm.     Pulses: Normal pulses.     Heart sounds: Normal heart sounds. No murmur heard.    No friction rub. No gallop.  Pulmonary:     Effort: Pulmonary effort is normal. No respiratory distress.     Breath sounds: Normal breath sounds. No stridor. No wheezing, rhonchi or rales.  Chest:     Chest wall: No tenderness.  Abdominal:     General: Bowel sounds are normal.     Palpations: Abdomen is soft.  Musculoskeletal:        General: No swelling, tenderness, deformity or signs of injury. Normal range of motion.     Right lower leg: No edema.     Left lower leg: No edema.  Skin:    General: Skin is warm and dry.     Capillary Refill: Capillary refill takes less than 2 seconds.     Coloration: Skin is not jaundiced or pale.     Findings: No bruising, erythema, lesion or rash.  Neurological:     General: No focal deficit present.     Mental Status: She is alert and oriented to person, place, and time. Mental status is at baseline.     Cranial Nerves: No cranial nerve deficit.     Sensory: No sensory deficit.     Motor: No weakness.     Coordination: Coordination normal.  Psychiatric:        Mood  and Affect: Mood normal.        Behavior: Behavior normal.        Thought Content: Thought content normal.        Judgment: Judgment normal.      No results found for any visits on 03/20/23.  Assessment & Plan     Problem List Items Addressed This Visit   None Visit Diagnoses     Flu vaccine need    -  Primary   Relevant Orders   Flu vaccine trivalent PF, 6mos and older(Flulaval,Afluria,Fluarix,Fluzone) (Completed)   Back strain, subsequent encounter  Relevant Medications   cyclobenzaprine (FLEXERIL) 10 MG tablet   Sciatica of left side       Relevant Medications   gabapentin (NEURONTIN) 300 MG capsule   cyclobenzaprine (FLEXERIL) 10 MG tablet      Herpes Zoster Ophthalmicus Recent episode of shingles involving the L eye, with residual nerve pain in the cheek and eye area. No current ocular symptoms. -Continue Gabapentin as needed for nerve pain. -Schedule an eye exam to assess for any residual ocular effects from the shingles.  Sciatica Improved with Gabapentin. -Continue Gabapentin as needed for sciatica pain; reduce from TID to at bedtime to assist.  Musculoskeletal Back Pain Recent flare with a large knot. -Prescribe Flexeril for short-term use during flare. -Advise high-dose Ibuprofen for three days to reduce inflammation ex 800 mg with meals x 3 days.  Hyperlipidemia Intolerance to Rosuvastatin due to muscle aches. -Discontinue Rosuvastatin. -Start Zetia 10mg  daily. -I continue to recommend diet low in saturated fat and regular exercise - 30 min at least 5 times per week  Hypertension Recent intermittent high readings at home, but normal in office today. -Advise patient to monitor blood pressure at home and journal readings. -Goal of   Tobacco Use Ongoing, with some efforts towards cessation. -Encourage continued efforts towards cessation.  General Health Maintenance -Administered influenza vaccine today. -Continue monitoring liver function, which  is currently normal. -Encourage continued weight loss efforts.  Leilani Merl, FNP, have reviewed all documentation for this visit. The documentation on 03/20/23 for the exam, diagnosis, procedures, and orders are all accurate and complete.  [name], FNP  [facility] [phone] (phone) [phone] (fax)  [facility]

## 2023-03-25 ENCOUNTER — Other Ambulatory Visit: Payer: Self-pay | Admitting: Gastroenterology

## 2023-03-25 NOTE — Telephone Encounter (Signed)
Last office visit 01/22/2023  Plan: Constipation Since high-dose Linzess is causing diarrhea, will try 145 mcg daily, samples provided, patient will update me via MyChart

## 2023-03-31 ENCOUNTER — Encounter: Payer: Self-pay | Admitting: Family Medicine

## 2023-04-01 ENCOUNTER — Encounter: Payer: Self-pay | Admitting: Gastroenterology

## 2023-04-01 ENCOUNTER — Other Ambulatory Visit: Payer: Self-pay | Admitting: Family Medicine

## 2023-04-01 DIAGNOSIS — A048 Other specified bacterial intestinal infections: Secondary | ICD-10-CM

## 2023-04-01 MED ORDER — GABAPENTIN 300 MG PO CAPS: 300.0000 mg | ORAL_CAPSULE | Freq: Every day | ORAL | 1 refills | Status: DC

## 2023-04-04 ENCOUNTER — Other Ambulatory Visit: Payer: Self-pay | Admitting: Gastroenterology

## 2023-04-04 LAB — H. PYLORI BREATH TEST: H pylori Breath Test: NEGATIVE

## 2023-04-06 ENCOUNTER — Other Ambulatory Visit: Payer: Self-pay | Admitting: Gastroenterology

## 2023-04-06 ENCOUNTER — Other Ambulatory Visit: Payer: Self-pay | Admitting: Family Medicine

## 2023-04-06 DIAGNOSIS — R1013 Epigastric pain: Secondary | ICD-10-CM

## 2023-04-07 MED ORDER — PROMETHAZINE HCL 12.5 MG PO TABS: 12.5000 mg | ORAL_TABLET | Freq: Three times a day (TID) | ORAL | 0 refills | Status: DC | PRN

## 2023-04-07 NOTE — Telephone Encounter (Signed)
Last office visit 01/22/2023 diarrhea  Last refills 10/31/2022 30 capsule 2 refills was d/c on 02/12/2023 by Shaune Pollack

## 2023-04-07 NOTE — Telephone Encounter (Signed)
Last office visit 01/22/2023 Diarrhea  Last refill 09/23/2022 0 refills

## 2023-05-04 ENCOUNTER — Other Ambulatory Visit: Payer: Self-pay | Admitting: Family Medicine

## 2023-05-04 ENCOUNTER — Other Ambulatory Visit: Payer: Self-pay | Admitting: Gastroenterology

## 2023-05-04 DIAGNOSIS — R1013 Epigastric pain: Secondary | ICD-10-CM

## 2023-05-05 NOTE — Telephone Encounter (Signed)
Last office visit  01/22/2023 diarrhea  Last refill 04/07/2023 0 refills

## 2023-05-09 ENCOUNTER — Ambulatory Visit
Admission: RE | Admit: 2023-05-09 | Discharge: 2023-05-09 | Disposition: A | Discharge status: Home / Self Care | Payer: 59 | Source: Ambulatory Visit | Attending: Family Medicine

## 2023-05-09 ENCOUNTER — Encounter: Payer: Self-pay | Admitting: Gastroenterology

## 2023-05-09 DIAGNOSIS — Z1231 Encounter for screening mammogram for malignant neoplasm of breast: Secondary | ICD-10-CM | POA: Diagnosis present

## 2023-05-13 ENCOUNTER — Other Ambulatory Visit: Payer: Self-pay | Admitting: Family Medicine

## 2023-05-13 DIAGNOSIS — R928 Other abnormal and inconclusive findings on diagnostic imaging of breast: Secondary | ICD-10-CM

## 2023-05-14 ENCOUNTER — Telehealth: Payer: Self-pay

## 2023-05-14 ENCOUNTER — Ambulatory Visit
Admission: RE | Admit: 2023-05-14 | Discharge: 2023-05-14 | Disposition: A | Discharge status: Home / Self Care | Payer: 59 | Source: Ambulatory Visit | Attending: Family Medicine | Admitting: Family Medicine

## 2023-05-14 DIAGNOSIS — R928 Other abnormal and inconclusive findings on diagnostic imaging of breast: Secondary | ICD-10-CM | POA: Insufficient documentation

## 2023-05-14 NOTE — Telephone Encounter (Signed)
Copied from CRM [id number]. Topic: Appointment Scheduling - Scheduling Inquiry for Clinic >> May 14, 2023 12:35 PM Marlow Baars wrote: Reason for CRM: The patient is calling in post mammogram results to schedule a follow up as requested by her provider for a diagnostic mammogram and an U/S of the breast. Please assist patient further as soon as possible

## 2023-06-03 ENCOUNTER — Other Ambulatory Visit: Payer: Self-pay | Admitting: Gastroenterology

## 2023-06-03 ENCOUNTER — Other Ambulatory Visit: Payer: Self-pay | Admitting: Family Medicine

## 2023-06-03 DIAGNOSIS — R1013 Epigastric pain: Secondary | ICD-10-CM

## 2023-06-03 NOTE — Telephone Encounter (Signed)
Please advise 

## 2023-06-17 ENCOUNTER — Other Ambulatory Visit: Payer: Self-pay | Admitting: Medical Genetics

## 2023-06-25 ENCOUNTER — Other Ambulatory Visit: Payer: Self-pay

## 2023-07-02 ENCOUNTER — Other Ambulatory Visit
Admission: RE | Admit: 2023-07-02 | Discharge: 2023-07-02 | Disposition: A | Discharge status: Home / Self Care | Payer: Self-pay | Source: Ambulatory Visit | Attending: Medical Genetics | Admitting: Medical Genetics

## 2023-07-04 ENCOUNTER — Ambulatory Visit: Payer: 59 | Admitting: Family Medicine

## 2023-07-04 VITALS — BP 122/70 | HR 83 | Resp 16 | Ht 66.0 in | Wt 169.0 lb

## 2023-07-04 DIAGNOSIS — M545 Low back pain, unspecified: Secondary | ICD-10-CM

## 2023-07-04 DIAGNOSIS — M533 Sacrococcygeal disorders, not elsewhere classified: Secondary | ICD-10-CM | POA: Diagnosis not present

## 2023-07-04 MED ORDER — KETOROLAC TROMETHAMINE 60 MG/2ML IM SOLN
30.0000 mg | Freq: Once | INTRAMUSCULAR | Status: AC
  Administered 2023-07-04: 30 mg via INTRAMUSCULAR

## 2023-07-04 MED ORDER — TRAMADOL HCL 50 MG PO TABS: 50.0000 mg | ORAL_TABLET | Freq: Three times a day (TID) | ORAL | 0 refills | Status: AC | PRN

## 2023-07-04 MED ORDER — METHOCARBAMOL 750 MG PO TABS: 750.0000 mg | ORAL_TABLET | Freq: Three times a day (TID) | ORAL | 1 refills | Status: AC | PRN

## 2023-07-04 MED ORDER — PREDNISONE 20 MG PO TABS: 40.0000 mg | ORAL_TABLET | Freq: Every day | ORAL | 0 refills | Status: AC

## 2023-07-04 MED ORDER — DEXAMETHASONE SODIUM PHOSPHATE 10 MG/ML IJ SOLN
10.0000 mg | Freq: Once | INTRAMUSCULAR | Status: AC
  Administered 2023-07-04: 10 mg via INTRAMUSCULAR

## 2023-07-04 NOTE — Progress Notes (Signed)
Patient ID: [name], female    DOB: 1976/03/04, 48 y.o.   MRN: [id number]  PCP: Debera Lat, PA-C  Chief Complaint  Patient presents with   Back Pain    Across lower back. x3 days, OTC ibuprofen and ice/heat not helping.    Subjective:   [name] is a 48 y.o. female, presents to clinic with CC of the following:  HPI  Patient presents with acute on chronic low back pain she states started to be severe about 3 to 4 days ago.  She reports history of a low back surgery a spinal fusion at L5-S1 and sacroiliac joint dysfunction She has pain in bilateral low back worse on the left side which radiates around her hip and down her leg So severe she was unable to go to work today She has tried heating pad muscle relaxers, Tylenol, NSAIDs She is previously done treatment with [facility] and she does not want to go back there, she is been managing with a chiropractor for most of the last year, she would like to follow-up with a spine surgeon again she previously went [facility] in [place] she wants to be referred to [facility] clinic Dr. Joice Lofts  At this time she denies weakness in her legs, numbness, saddle anesthesia or incontinence  Patient Active Problem List   Diagnosis Date Noted   Adenomatous polyp of transverse colon 11/01/2022   Screening for colon cancer 11/01/2022   Chronic cough 09/10/2022   Avitaminosis D 09/10/2022   History of Helicobacter pylori infection 09/10/2022   Tobacco use disorder 09/10/2022   Alcohol use disorder, moderate, dependence (HCC) 09/10/2022   Dyspepsia 06/25/2022   Early satiety 06/21/2022   Daily headache 06/21/2022   Hospital discharge follow-up 06/21/2022   CMV (cytomegalovirus infection) status positive (HCC) 06/13/2022   Fever 06/04/2022   Sacroiliac joint pain 12/05/2021   Annual physical exam 09/03/2021   BRCA negative 08/10/2018      Current Outpatient Medications:    Blood Glucose Monitoring  Suppl DEVI, 1 each by Does not apply route 2 (two) times daily as needed. May substitute to any manufacturer covered by patient's insurance., Disp: 1 each, Rfl: 0   cyclobenzaprine (FLEXERIL) 10 MG tablet, Take 1 tablet (10 mg total) by mouth 3 (three) times daily as needed for muscle spasms., Disp: 30 tablet, Rfl: 0   DOTTI 0.05 MG/24HR patch, Place 1 patch onto the skin 2 (two) times a week., Disp: , Rfl:    ezetimibe (ZETIA) 10 MG tablet, Take 1 tablet (10 mg total) by mouth daily., Disp: 30 tablet, Rfl: 11   FLUoxetine (PROZAC) 40 MG capsule, Take 1 capsule (40 mg total) by mouth daily., Disp: 90 capsule, Rfl: 3   gabapentin (NEURONTIN) 300 MG capsule, Take 1 capsule (300 mg total) by mouth at bedtime., Disp: 90 capsule, Rfl: 1   linaclotide (LINZESS) 145 MCG CAPS capsule, TAKE 1 CAPSULE BY MOUTH ONCE DAILY BEFORE BREAKFAST, Disp: 30 capsule, Rfl: 3   nortriptyline (PAMELOR) 10 MG capsule, Take nortriptyline 10 mg at night for one week then increase to 20 mg at night and continue this dose., Disp: , Rfl:    omeprazole (PRILOSEC) 40 MG capsule, Take 1 capsule by mouth once daily, Disp: 30 capsule, Rfl: 2   Pancrelipase, Lip-Prot-Amyl, (CREON) 24000-76000 units CPEP, TAKE 1 CAPSULE BY MOUTH WITH THE FIRST BITE OF EACH MEAL AND 1 CAPSULE WITH THE FIRST BITE OF EACH SNACK, Disp: 150 capsule, Rfl: 6  promethazine (PHENERGAN) 12.5 MG tablet, Take 1 tablet (12.5 mg total) by mouth every 8 (eight) hours as needed for nausea or vomiting., Disp: 30 tablet, Rfl: 0   rizatriptan (MAXALT-MLT) 5 MG disintegrating tablet, Take 5 mg by mouth as needed., Disp: , Rfl:    traZODone (DESYREL) 100 MG tablet, Take 2 tablets (200 mg total) by mouth at bedtime., Disp: 180 tablet, Rfl: 1   valACYclovir (VALTREX) 500 MG tablet, Take 2 tablets (1,000 mg total) by mouth 2 (two) times daily., Disp: 20 tablet, Rfl: 11   Allergies  Allergen Reactions   Statins Other (See Comments)    myopathy   Codeine Nausea And Vomiting    Sulfa Antibiotics Rash   Vicodin [Hydrocodone-Acetaminophen] Nausea Only    And itching all over     Social History   Tobacco Use   Smoking status: Every Day    Current packs/day: 0.75    Average packs/day: 0.8 packs/day for 30.0 years (22.5 ttl pk-yrs)    Types: Cigarettes   Smokeless tobacco: Never  Vaping Use   Vaping status: Some Days  Substance Use Topics   Alcohol use: Yes    Comment: 2-6 beers daily, down from 12+   Drug use: No      Chart Review Today: I personally reviewed active problem list, medication list, allergies, family history, social history, health maintenance, notes from last encounter, lab results, imaging with the patient/caregiver today.   Review of Systems  Constitutional: Negative.   HENT: Negative.    Eyes: Negative.   Respiratory: Negative.    Cardiovascular: Negative.   Gastrointestinal: Negative.   Endocrine: Negative.   Genitourinary: Negative.   Musculoskeletal: Negative.   Skin: Negative.   Allergic/Immunologic: Negative.   Neurological: Negative.   Hematological: Negative.   Psychiatric/Behavioral: Negative.    All other systems reviewed and are negative.      Objective:   Vitals:   07/04/23 1352  BP: 122/70  Pulse: 83  Resp: 16  SpO2: 97%  Weight: 169 lb (76.7 kg)  Height: 5\' 6"  (1.676 m)    Body mass index is 27.28 kg/m.  Physical Exam Vitals and nursing note reviewed.  Constitutional:      Appearance: She is well-developed.  HENT:     Head: Normocephalic and atraumatic.     Nose: Nose normal.  Eyes:     General:        Right eye: No discharge.        Left eye: No discharge.     Conjunctiva/sclera: Conjunctivae normal.  Neck:     Trachea: No tracheal deviation.  Cardiovascular:     Rate and Rhythm: Normal rate and regular rhythm.  Pulmonary:     Effort: Pulmonary effort is normal. No respiratory distress.     Breath sounds: No stridor.  Musculoskeletal:     Thoracic back: No tenderness or bony  tenderness. Normal range of motion.     Lumbar back: No spasms, tenderness or bony tenderness. Decreased range of motion. Negative right straight leg raise test and negative left straight leg raise test.  Skin:    General: Skin is warm and dry.     Findings: No rash.  Neurological:     Mental Status: She is alert.     Motor: No weakness or abnormal muscle tone.     Coordination: Coordination normal.     Gait: Gait abnormal.     Comments: Grossly normal sensation to light touch to bilateral lower extremities 5/5 bilateral  dorsiflexion and plantarflexion Antalgic gait  Psychiatric:        Behavior: Behavior normal.      Results for orders placed or performed in visit on 04/01/23  H. pylori breath test   Collection Time: 04/03/23  1:14 PM  Result Value Ref Range   H pylori Breath Test Negative Negative       Assessment & Plan:   1. Acute midline low back pain, unspecified whether sciatica present (Primary) Acute low back pain vs SI joint flare/strain? Hx of low back surgery and SI joint dysfunction, failed injections, and managedment with [facility]o at [facility] and chiropractor ($5000 later this year) Tx more aggressively since pain is severe and acute on chronic - advised to use Tylenol and NSAIDs, heat therapy, start the steroids tomorrow an injection of Toradol and Decadron was given today she can try tramadol for severe pain causing her to cry or preventing sleep - methocarbamol (ROBAXIN) 750 MG tablet; Take 1 tablet (750 mg total) by mouth every 8 (eight) hours as needed for muscle spasms.  Dispense: 30 tablet; Refill: 1 - predniSONE (DELTASONE) 20 MG tablet; Take 2 tablets (40 mg total) by mouth daily with breakfast for 5 days.  Dispense: 10 tablet; Refill: 0 - Ambulatory referral to Orthopedic Surgery - dexamethasone (DECADRON) injection 10 mg - ketorolac (TORADOL) injection 30 mg - traMADol (ULTRAM) 50 MG tablet; Take 1 tablet (50 mg total) by mouth every 8 (eight) hours as  needed for up to 5 days.  Dispense: 15 tablet; Refill: 0  2. SI (sacroiliac) joint dysfunction - Ambulatory referral to Orthopedic Surgery - traMADol (ULTRAM) 50 MG tablet; Take 1 tablet (50 mg total) by mouth every 8 (eight) hours as needed for up to 5 days.  Dispense: 15 tablet; Refill: 0  Instructions given to the patient:  Recommend heat, avioding lifting or anything strenuous, walking is great for back pain once your flare calms down a little. Apply heat, continue to take tylenol and an over the counter NSAID (ibuprofen no different for pain management with taking more than 400 mg at a time) You can try the muscle relaxer if you have spasms or very tight muscle or pain preventing sleep.    Below is [facility] orth[facility] info in case you have any sudden worsening and want to see an orthopedic specialist - you can do a walk in urgent care visit or go online and schedule it yourself.   [facility] [address] [place], [place] [address] Phone: [phone] [email or web address]   I put in the referral urgently to Dr. Joice Lofts Call Gavin Potters to f/up next week with the urgent referral.  Red flags to go to the ER - weakness in leg, numbness to groin/genital or inner thigh area, new incontinence.    Danelle Berry, PA-C 07/04/23 2:03 PM

## 2023-07-04 NOTE — Patient Instructions (Addendum)
Recommend heat, avioding lifting or anything strenuous, walking is great for back pain once your flare calms down a little. Apply heat, continue to take tylenol and an over the counter NSAID (ibuprofen no different for pain management with taking more than 400 mg at a time) You can try the muscle relaxer if you have spasms or very tight muscle or pain preventing sleep.    Below is [facility] ortho info in case you have any sudden worsening and want to see an orthopedic specialist - you can do a walk in urgent care visit or go online and schedule it yourself.   [facility] [address] [place], [place] [address] Phone: [phone] [email or web address]   I put in the referral urgently to Dr. Joice Lofts Call Gavin Potters to f/up next week with the urgent referral.  Red flags to go to the ER - weakness in leg, numbness to groin/genital or inner thigh area, new incontinence.

## 2023-07-07 ENCOUNTER — Telehealth: Payer: Self-pay | Admitting: Physician Assistant

## 2023-07-07 NOTE — Telephone Encounter (Signed)
Referral was sent to Dr. Joice Lofts. Please allow them time to receive the referral and get scheduled.

## 2023-07-07 NOTE — Telephone Encounter (Signed)
Pt is a pt of [facility] seen 07/04/23 at [facility] for back pain. Calling to request if referral can be resent to  Orthopedic Surgery Poggi, Excell Seltzer, MD  Reporting that Dr. Binnie Rail office. Has not received the referral.

## 2023-07-08 DIAGNOSIS — E875 Hyperkalemia: Secondary | ICD-10-CM | POA: Insufficient documentation

## 2023-07-13 LAB — GENECONNECT MOLECULAR SCREEN: Genetic Analysis Overall Interpretation: NEGATIVE

## 2023-07-17 ENCOUNTER — Other Ambulatory Visit: Payer: Self-pay | Admitting: Orthopedic Surgery

## 2023-07-17 DIAGNOSIS — M5417 Radiculopathy, lumbosacral region: Secondary | ICD-10-CM

## 2023-07-17 DIAGNOSIS — G8929 Other chronic pain: Secondary | ICD-10-CM

## 2023-07-17 DIAGNOSIS — Z981 Arthrodesis status: Secondary | ICD-10-CM

## 2023-07-20 ENCOUNTER — Ambulatory Visit
Admission: RE | Admit: 2023-07-20 | Discharge: 2023-07-20 | Disposition: A | Discharge status: Home / Self Care | Payer: 59 | Source: Ambulatory Visit | Attending: Orthopedic Surgery | Admitting: Orthopedic Surgery

## 2023-07-20 DIAGNOSIS — M5442 Lumbago with sciatica, left side: Secondary | ICD-10-CM | POA: Insufficient documentation

## 2023-07-20 DIAGNOSIS — M5417 Radiculopathy, lumbosacral region: Secondary | ICD-10-CM | POA: Insufficient documentation

## 2023-07-20 DIAGNOSIS — G8929 Other chronic pain: Secondary | ICD-10-CM | POA: Diagnosis present

## 2023-07-20 DIAGNOSIS — Z981 Arthrodesis status: Secondary | ICD-10-CM | POA: Diagnosis present

## 2023-08-15 ENCOUNTER — Encounter: Payer: Self-pay | Admitting: Gastroenterology

## 2023-09-02 ENCOUNTER — Other Ambulatory Visit: Payer: Self-pay | Admitting: Gastroenterology

## 2023-09-02 DIAGNOSIS — R1013 Epigastric pain: Secondary | ICD-10-CM

## 2023-09-15 ENCOUNTER — Encounter: Payer: Self-pay | Admitting: Physician Assistant

## 2023-09-15 DIAGNOSIS — F339 Major depressive disorder, recurrent, unspecified: Secondary | ICD-10-CM | POA: Insufficient documentation

## 2023-09-15 NOTE — Progress Notes (Deleted)
 Complete physical exam  Patient: [name]   DOB: 1975-07-02   48 y.o. Female  MRN: [id number] Visit Date: 09/15/2023  Today's healthcare provider: Debera Lat, PA-C   No chief complaint on file.  Subjective    [name] is a 48 y.o. female who presents today for a complete physical exam.  She reports consuming a {diet types:17450} diet. {Exercise:19826} She generally feels {well/fairly well/poorly:18703}. She reports sleeping {well/fairly well/poorly:18703}. She {does/does not:200015} have additional problems to discuss today.  HPI  *** Discussed the use of AI scribe software for clinical note transcription with the patient, who gave verbal consent to proceed.  History of Present Illness     Last depression screening scores    07/04/2023    1:56 PM 03/13/2023    2:42 PM 09/30/2022    5:36 PM  PHQ 2/9 Scores  PHQ - 2 Score 0 0 1  PHQ- 9 Score 0 0    Last fall risk screening    03/13/2023    2:42 PM  Fall Risk   Falls in the past year? 0  Number falls in past yr: 0  Injury with Fall? 0  Risk for fall due to : No Fall Risks  Follow up Falls prevention discussed;Education provided;Falls evaluation completed   Last Audit-C alcohol use screening    07/04/2023   10:37 AM  Alcohol Use Disorder Test (AUDIT)  1. How often do you have a drink containing alcohol? 2  2. How many drinks containing alcohol do you have on a typical day when you are drinking? 1  3. How often do you have six or more drinks on one occasion? 1  AUDIT-C Score 4   4. How often during the last year have you found that you were not able to stop drinking once you had started? 1  5. How often during the last year have you failed to do what was normally expected from you because of drinking? 0  6. How often during the last year have you needed a first drink in the morning to get yourself going after a heavy drinking session? 0  7. How often during the last year have you had a feeling of  guilt of remorse after drinking? 0  8. How often during the last year have you been unable to remember what happened the night before because you had been drinking? 0  9. Have you or someone else been injured as a result of your drinking? 0  10. Has a relative or friend or a doctor or another health worker been concerned about your drinking or suggested you cut down? 2  Alcohol Use Disorder Identification Test Final Score (AUDIT) 7      Patient-reported   A score of 3 or more in women, and 4 or more in men indicates increased risk for alcohol abuse, EXCEPT if all of the points are from question 1   Past Medical History:  Diagnosis Date   BRCA negative 07/2018   MyRisk neg   Family history of breast cancer    IBIS=13%/riskscore=8.4%   Family history of ovarian cancer    Family history of pancreatic cancer    GERD (gastroesophageal reflux disease)    History of frequent urinary tract infections    Hypoglycemia    IBS (irritable bowel syndrome)    Migraine headache    linger for days when they happen   Vertigo    none for several years   Past Surgical  History:  Procedure Laterality Date   ABDOMINAL HYSTERECTOMY  07/2006   PARTIAL   APPENDECTOMY     BACK SURGERY     CHOLECYSTECTOMY     COLONOSCOPY WITH PROPOFOL N/A 11/01/2022   Procedure: COLONOSCOPY WITH PROPOFOL;  Surgeon: Selena Daily, MD;  Location[facility];  Service: Gastroenterology;  Laterality: N/A;   DIAGNOSTIC LAPAROSCOPY     test on bladder   ESOPHAGOGASTRODUODENOSCOPY (EGD) WITH PROPOFOL N/A 06/25/2022   Procedure: ESOPHAGOGASTRODUODENOSCOPY (EGD) WITH PROPOFOL;  Surgeon: Selena Daily, MD;  Location: [facility];  Service: Endoscopy;  Laterality: N/A;   GALLBLADDER SURGERY     LAPAROSCOPIC APPENDECTOMY N/A 12/08/2016   Procedure: APPENDECTOMY LAPAROSCOPIC;  Surgeon: Alben Alma, MD;  Location: [facility] ORS;  Service: General;  Laterality: N/A;   MAXIMUM ACCESS (MAS)POSTERIOR LUMBAR  INTERBODY FUSION (PLIF) 1 LEVEL N/A 05/06/2013   Procedure: LUMBAR FIVE-SACRAL ONE MAXIMUM ACCESS POSTERIOR LUMBAR INTERBODY FUSION;  Surgeon: Isadora Mar, MD;  Location: [facility]URO ORS;  Service: Neurosurgery;  Laterality: N/A;   TOE SURGERY     TUBAL LIGATION     WISDOM TOOTH EXTRACTION     Social History   Socioeconomic History   Marital status: Significant Other    Spouse name: Not on file   Number of children: Not on file   Years of education: Not on file   Highest education level: Some college, no degree  Occupational History   Not on file  Tobacco Use   Smoking status: Every Day    Current packs/day: 0.75    Average packs/day: 0.8 packs/day for 30.0 years (22.5 ttl pk-yrs)    Types: Cigarettes   Smokeless tobacco: Never  Vaping Use   Vaping status: Some Days  Substance and Sexual Activity   Alcohol use: Yes    Comment: 2-6 beers daily, down from 12+   Drug use: No   Sexual activity: Yes    Partners: Female, Female    Birth control/protection: Surgical    Comment: Hysterectomy  Other Topics Concern   Not on file  Social History Narrative   Not on file   Social Drivers of Health   Financial Resource Strain: Low Risk  (08/11/2023)   Received from [facility]   Overall Financial Resource Strain (CARDIA)    Difficulty of Paying Living Expenses: Not hard at all  Food Insecurity: No Food Insecurity (08/11/2023)   Received from [facility]   Hunger Vital Sign    Worried About Running Out of Food in the Last Year: Never true    Ran Out of Food in the Last Year: Never true  Transportation Needs: No Transportation Needs (08/11/2023)   Received from [facility] - Transportation    In the past 12 months, has lack of transportation kept you from medical appointments or from getting medications?: No    Lack of Transportation (Non-Medical): No  Physical Activity: Unknown (07/04/2023)   Exercise Vital Sign    Days  of Exercise per Week: 0 days    Minutes of Exercise per Session: Not on file  Stress: No Stress Concern Present (07/04/2023)   [employer] of Occupational Health - Occupational Stress Questionnaire    Feeling of Stress : Only a little  Social Connections: Socially Integrated (07/04/2023)   Social Connection and Isolation Panel [NHANES]    Frequency of Communication with Friends and Family: More than three times a week    Frequency of Social  Gatherings with Friends and Family: Once a week    Attends Religious Services: More than 4 times per year    Active Member of [employer] or Organizations: Yes    Attends Engineer, structural: More than 4 times per year    Marital Status: Living with partner  Intimate Partner Violence: Not At Risk (06/07/2022)   Humiliation, Afraid, Rape, and Kick questionnaire    Fear of Current or Ex-Partner: No    Emotionally Abused: No    Physically Abused: No    Sexually Abused: No   Family Status  Relation Name Status   Mother  Alive   Father  Alive   Mat Aunt  Deceased   Mat Aunt  Deceased   Pat Uncle  Deceased   MGM  Deceased   MGF  Deceased  No partnership data on file   Family History  Problem Relation Age of Onset   Hypertension Mother    COPD Mother    Epilepsy Mother    Sleep apnea Mother    Hypertension Father    Sleep apnea Father    Breast cancer Maternal Aunt 42   Ovarian cancer Maternal Aunt 45   Colon cancer Paternal Uncle 58   Pancreatic cancer Maternal Grandmother 56   Colon cancer Maternal Grandfather 63   Allergies  Allergen Reactions   Statins Other (See Comments)    myopathy   Codeine Nausea And Vomiting   Sulfa Antibiotics Rash   Vicodin [Hydrocodone-Acetaminophen] Nausea Only    And itching all over    Patient Care Team: Cherlynn Polo as PCP - General (Physician Assistant)   Medications: Outpatient Medications Prior to Visit  Medication Sig   Blood Glucose Monitoring Suppl DEVI 1 each by Does not  apply route 2 (two) times daily as needed. May substitute to any manufacturer covered by patient's insurance.   cyclobenzaprine (FLEXERIL) 10 MG tablet Take 1 tablet (10 mg total) by mouth 3 (three) times daily as needed for muscle spasms.   DOTTI 0.05 MG/24HR patch Place 1 patch onto the skin 2 (two) times a week.   ezetimibe (ZETIA) 10 MG tablet Take 1 tablet (10 mg total) by mouth daily.   FLUoxetine (PROZAC) 40 MG capsule Take 1 capsule (40 mg total) by mouth daily.   gabapentin (NEURONTIN) 300 MG capsule Take 1 capsule (300 mg total) by mouth at bedtime.   linaclotide (LINZESS) 145 MCG CAPS capsule TAKE 1 CAPSULE BY MOUTH ONCE DAILY BEFORE BREAKFAST   methocarbamol (ROBAXIN) 750 MG tablet Take 1 tablet (750 mg total) by mouth every 8 (eight) hours as needed for muscle spasms.   nortriptyline (PAMELOR) 10 MG capsule Take nortriptyline 10 mg at night for one week then increase to 20 mg at night and continue this dose.   omeprazole (PRILOSEC) 40 MG capsule Take 1 capsule by mouth once daily   Pancrelipase, Lip-Prot-Amyl, (CREON) 24000-76000 units CPEP TAKE 1 CAPSULE BY MOUTH WITH THE FIRST BITE OF EACH MEAL AND 1 CAPSULE WITH THE FIRST BITE OF EACH SNACK   promethazine (PHENERGAN) 12.5 MG tablet Take 1 tablet (12.5 mg total) by mouth every 8 (eight) hours as needed for nausea or vomiting.   rizatriptan (MAXALT-MLT) 5 MG disintegrating tablet Take 5 mg by mouth as needed.   traZODone (DESYREL) 100 MG tablet Take 2 tablets (200 mg total) by mouth at bedtime.   valACYclovir (VALTREX) 500 MG tablet Take 2 tablets (1,000 mg total) by mouth 2 (two) times daily.  No facility-administered medications prior to visit.    Review of Systems Except see HPI  {Insert previous labs (optional):23779} {See past labs  Heme  Chem  Endocrine  Serology  Results Review (optional):1}  Objective    There were no vitals taken for this visit. {Insert last BP/Wt (optional):23777}{See vitals history  (optional):1}    Physical Exam   No results found for any visits on 09/15/23.  Assessment & Plan    Routine Health Maintenance and Physical Exam  Exercise Activities and Dietary recommendations  Goals      Manage My Emotions     Timeframe:  Long-Range Goal Priority:  Medium Start Date:      09/18/21                       Expected End Date:    03/20/22                   Follow Up Date 10/01/21   - begin personal counseling - talk about feelings with a friend, family or spiritual advisor - practice positive thinking and self-talk  -please contact therapist of your choice to schedule initial appointment: Clarice Pole [phone] [facility] [phone] [facility] [phone] [facility]  [phone]   Why is this important?   When you are stressed, down or upset, your body reacts too.  For example, your blood pressure may get higher; you may have a headache or stomachache.  When your emotions get the best of you, your body's ability to fight off cold and flu gets weak.  These steps will help you manage your emotions.     Notes:         Immunization History  Administered Date(s) Administered   Influenza Split 04/07/2007   Influenza, Seasonal, Injecte, Preservative Fre 03/20/2023   Influenza,inj,Quad PF,6+ Mos 03/04/2014   Tdap 03/04/2014    Health Maintenance  Topic Date Due   COVID-19 Vaccine (1 - 2024-25 season) Never done   Pneumococcal Vaccination (1 of 2 - PCV) 07/03/2024*   Flu Shot  01/02/2024   DTaP/Tdap/Td vaccine (2 - Td or Tdap) 03/04/2024   Colon Cancer Screening  10/31/2025   Hepatitis C Screening  Completed   HIV Screening  Completed   HPV Vaccine  Aged Out   Meningitis B Vaccine  Aged Out  *Topic was postponed. The date shown is not the original due date.    Discussed health benefits of physical activity, and encouraged her to engage in regular exercise appropriate for her age and  condition.  Assessment and Plan Assessment & Plan      ***  No follow-ups on file.    The patient was advised to call back or seek an in-person evaluation if the symptoms worsen or if the condition fails to improve as anticipated.  I discussed the assessment and treatment plan with the patient. The patient was provided an opportunity to ask questions and all were answered. The patient agreed with the plan and demonstrated an understanding of the instructions.  I, Debera Lat, PA-C have reviewed all documentation for this visit. The documentation on 09/15/2023  for the exam, diagnosis, procedures, and orders are all accurate and complete.  Debera Lat, [facility], [facility] [phone] (phone) [phone] (fax)  [facility]

## 2023-09-27 ENCOUNTER — Other Ambulatory Visit: Payer: Self-pay | Admitting: Gastroenterology

## 2023-09-27 DIAGNOSIS — R1013 Epigastric pain: Secondary | ICD-10-CM

## 2023-09-29 NOTE — Telephone Encounter (Signed)
 Last office visit 01/22/2024 [name]  Last refill 09/02/2023 0 refills  No appointment is scheduled

## 2023-10-15 ENCOUNTER — Telehealth: Payer: Self-pay | Admitting: Physician Assistant

## 2023-10-15 DIAGNOSIS — F339 Major depressive disorder, recurrent, unspecified: Secondary | ICD-10-CM

## 2023-10-15 MED ORDER — FLUOXETINE HCL 40 MG PO CAPS: 40.0000 mg | ORAL_CAPSULE | Freq: Every day | ORAL | 3 refills | Status: AC

## 2023-10-15 NOTE — Telephone Encounter (Signed)
 [facility] is requesting refill FLUoxetine  (PROZAC ) 40 MG capsule   Please advise

## 2023-10-20 ENCOUNTER — Ambulatory Visit: Payer: Self-pay

## 2023-10-20 DIAGNOSIS — F339 Major depressive disorder, recurrent, unspecified: Secondary | ICD-10-CM

## 2023-10-20 NOTE — Telephone Encounter (Signed)
 Copied from CRM [id number]. Topic: Clinical - Red Word Triage >> Oct 20, 2023  8:11 AM Jenna Moan wrote: Red Word that prompted transfer to Nurse Triage: Patient had a cough since Friday, currently wheezing, and running a temperature of 101 has been taking tylenol  and ibphoren, concerns of maybe having covid  10/20/23 11:57- 1st attempt, no answer, LVMTCB [phone]

## 2023-10-20 NOTE — Telephone Encounter (Signed)
  Chief Complaint: Rx refill/wheezing Additional Notes: Pt initially called NT line for wheezing, but when Triage called pt back, pt stated that she was currently in ED awaiting tx for bronchitis. Pt no longer needs acute appt, but did inquire about a medication refill. Pt requesting FLUoxetine  (PROZAC ) 40 MG capsule refilled since she was previously a pt of Elise, NP and is in the process of transitioning to J, Ostwalt. Triager will forward encounter for J. Ostwalt 's office to review and refill.    Reason for Disposition  [1] Prescription refill request for ESSENTIAL medicine (i.e., likelihood of harm to patient if not taken) AND [2] triager unable to refill per department policy  Answer Assessment - Initial Assessment Questions 1. DRUG NAME: "What medicine do you need to have refilled?"     FLUoxetine  (PROZAC ) 40 MG capsule 2. REFILLS REMAINING: "How many refills are remaining?" (Note: The label on the medicine or pill bottle will show how many refills are remaining. If there are no refills remaining, then a renewal may be needed.)     0  3. EXPIRATION DATE: "What is the expiration date?" (Note: The label states when the prescription will expire, and thus can no longer be refilled.)     N/a 4. PRESCRIBING HCP: "Who prescribed it?" Reason: If prescribed by specialist, call should be referred to that group.     Debbi Failing, NP 5. SYMPTOMS: "Do you have any symptoms?"     denies 6. PREGNANCY: "Is there any chance that you are pregnant?" "When was your last menstrual period?"     N/a  Protocols used: Medication Refill and Renewal Call-A-AH

## 2023-10-24 ENCOUNTER — Encounter: Admitting: Physician Assistant

## 2023-10-30 ENCOUNTER — Other Ambulatory Visit: Payer: Self-pay | Admitting: Gastroenterology

## 2023-10-30 DIAGNOSIS — R1013 Epigastric pain: Secondary | ICD-10-CM

## 2023-11-11 ENCOUNTER — Ambulatory Visit (INDEPENDENT_AMBULATORY_CARE_PROVIDER_SITE_OTHER): Admitting: Physician Assistant

## 2023-11-11 ENCOUNTER — Encounter: Payer: Self-pay | Admitting: Physician Assistant

## 2023-11-11 VITALS — BP 139/88 | HR 79 | Resp 16 | Ht 67.0 in | Wt 195.0 lb

## 2023-11-11 DIAGNOSIS — F1020 Alcohol dependence, uncomplicated: Secondary | ICD-10-CM | POA: Diagnosis not present

## 2023-11-11 DIAGNOSIS — F17200 Nicotine dependence, unspecified, uncomplicated: Secondary | ICD-10-CM

## 2023-11-11 DIAGNOSIS — F339 Major depressive disorder, recurrent, unspecified: Secondary | ICD-10-CM

## 2023-11-11 DIAGNOSIS — R030 Elevated blood-pressure reading, without diagnosis of hypertension: Secondary | ICD-10-CM

## 2023-11-11 DIAGNOSIS — K8689 Other specified diseases of pancreas: Secondary | ICD-10-CM

## 2023-11-11 DIAGNOSIS — Z780 Asymptomatic menopausal state: Secondary | ICD-10-CM

## 2023-11-11 DIAGNOSIS — E669 Obesity, unspecified: Secondary | ICD-10-CM

## 2023-11-11 NOTE — Progress Notes (Signed)
 Established patient visit  Patient: [name]   DOB: [birth year]   48 y.o. Female  MRN: [id number] Visit Date: 11/11/2023  Today's healthcare provider: Blane Bunting, PA-C   Chief Complaint  Patient presents with   Transitions Of Care    TOC/ API ..( Labs for Vitamin)  Referral to GI -Menopause no meds for 3 months .Wt loss concerns   Subjective     HPI     Transitions Of Care    Additional comments: TOC/ API ..( Labs for Vitamin)  Referral to GI -Menopause no meds for 3 months .Wt loss concerns      Last edited by Estill Hemming, CMA on 11/11/2023  3:18 PM.       Discussed the use of AI scribe software for clinical note transcription with the patient, who gave verbal consent to proceed.  History of Present Illness [name] "[name]" is a 48 year old female who presents with concerns about menopause symptoms and elevated blood pressure.  She experiences severe hot flashes, mood swings, increased fatigue, and difficulty focusing, which she attributes to menopause. She had a hysterectomy in 2007 but retained her ovaries. She discontinued hormone therapy three months ago due to concerns about its appropriateness. She feels unmotivated and struggles with household tasks.  Her blood pressure readings have been elevated during specialist visits, but she is not on antihypertensive medication. She experiences anxiety when abstaining from alcohol, consuming up to eight drinks on one to two days per week. She has a history of heavier alcohol use.  She smokes approximately 15 cigarettes daily and wants to quit. She experiences tingling in her legs and arms, managed with muscle relaxers. She reports worsening vision, especially at night, and has gained weight, feeling she has transitioned into the obesity range.       11/11/2023    3:35 PM 07/04/2023    1:56 PM 03/13/2023    2:42 PM  Depression screen PHQ 2/9  Decreased Interest 1 0 0  Down, Depressed, Hopeless 1 0 0  PHQ -  2 Score 2 0 0  Altered sleeping 2 0 0  Tired, decreased energy 3 0 0  Change in appetite 0 0 0  Feeling bad or failure about yourself  0 0 0  Trouble concentrating 2 0 0  Moving slowly or fidgety/restless 0 0 0  Suicidal thoughts 0 0 0  PHQ-9 Score 9 0 0  Difficult doing work/chores   Not difficult at all      11/11/2023    3:35 PM 07/20/2020    8:09 AM  GAD 7 : Generalized Anxiety Score  Nervous, Anxious, on Edge 1 2  Control/stop worrying 0 3  Worry too much - different things 1 3  Trouble relaxing 1 1  Restless 0 0  Easily annoyed or irritable 2 2  Afraid - awful might happen 0 1  Total GAD 7 Score 5 12  Anxiety Difficulty Somewhat difficult Somewhat difficult    Medications: Outpatient Medications Prior to Visit  Medication Sig   Blood Glucose Monitoring Suppl DEVI 1 each by Does not apply route 2 (two) times daily as needed. May substitute to any manufacturer covered by patient's insurance.   cyclobenzaprine  (FLEXERIL ) 10 MG tablet Take 1 tablet (10 mg total) by mouth 3 (three) times daily as needed for muscle spasms.   DOTTI 0.05 MG/24HR patch Place 1 patch onto the skin 2 (two) times a week.   ezetimibe  (ZETIA ) 10 MG tablet Take 1 tablet (  10 mg total) by mouth daily.   FLUoxetine  (PROZAC ) 40 MG capsule Take 1 capsule (40 mg total) by mouth daily.   gabapentin  (NEURONTIN ) 300 MG capsule Take 1 capsule (300 mg total) by mouth at bedtime.   linaclotide  (LINZESS ) 145 MCG CAPS capsule TAKE 1 CAPSULE BY MOUTH ONCE DAILY BEFORE BREAKFAST   methocarbamol  (ROBAXIN ) 750 MG tablet Take 1 tablet (750 mg total) by mouth every 8 (eight) hours as needed for muscle spasms.   nortriptyline (PAMELOR) 10 MG capsule Take nortriptyline 10 mg at night for one week then increase to 20 mg at night and continue this dose.   omeprazole  (PRILOSEC) 40 MG capsule Take 1 capsule by mouth once daily   Pancrelipase , Lip-Prot-Amyl, (CREON ) 24000-76000 units CPEP TAKE 1 CAPSULE BY MOUTH WITH THE FIRST  BITE OF EACH MEAL AND 1 CAPSULE WITH THE FIRST BITE OF EACH SNACK   promethazine  (PHENERGAN ) 12.5 MG tablet Take 1 tablet (12.5 mg total) by mouth every 8 (eight) hours as needed for nausea or vomiting.   rizatriptan (MAXALT-MLT) 5 MG disintegrating tablet Take 5 mg by mouth as needed.   traZODone  (DESYREL ) 100 MG tablet Take 2 tablets (200 mg total) by mouth at bedtime.   valACYclovir  (VALTREX ) 500 MG tablet Take 2 tablets (1,000 mg total) by mouth 2 (two) times daily.   No facility-administered medications prior to visit.    Review of Systems All negative Except see HPI       Objective    BP 139/88 (BP Location: Left Arm, Patient Position: Sitting)   Pulse 79   Resp 16   Ht 5\' 7"  (1.702 m)   Wt 195 lb (88.5 kg)   SpO2 99%   BMI 30.54 kg/m     Physical Exam Vitals reviewed.  Constitutional:      General: She is not in acute distress.    Appearance: Normal appearance. She is well-developed. She is obese. She is not diaphoretic.  HENT:     Head: Normocephalic and atraumatic.  Eyes:     General: No scleral icterus.    Conjunctiva/sclera: Conjunctivae normal.  Neck:     Thyroid: No thyromegaly.  Cardiovascular:     Rate and Rhythm: Normal rate and regular rhythm.     Pulses: Normal pulses.     Heart sounds: Normal heart sounds. No murmur heard. Pulmonary:     Effort: Pulmonary effort is normal. No respiratory distress.     Breath sounds: Normal breath sounds. No wheezing, rhonchi or rales.  Musculoskeletal:     Cervical back: Neck supple.     Right lower leg: No edema.     Left lower leg: No edema.  Lymphadenopathy:     Cervical: No cervical adenopathy.  Skin:    General: Skin is warm and dry.     Findings: No rash.  Neurological:     Mental Status: She is alert and oriented to person, place, and time. Mental status is at baseline.  Psychiatric:        Mood and Affect: Mood normal.        Behavior: Behavior normal.      No results found for any visits on  11/11/23.      Assessment and Plan Assessment & Plan Menopausal symptoms Significant menopausal symptoms affecting quality of life. Discontinued hormone therapy due to safety concerns. Seeks effective management. - Refer to endocrinologist at [facility] for evaluation and management. - Check hormonal levels. - Discuss Effexor for menopausal symptoms, depression, and anxiety.  Depression and anxiety Chronic Long-standing depression and anxiety managed with fluoxetine . Reports increased anxiety, suggesting possible medication tolerance. - Consider psychiatric evaluation for fluoxetine  adjustment or additional medications. - Discuss Effexor for managing menopausal symptoms, depression, and anxiety.  Alcohol use disorder Chronic Heavy alcohol use reduced but still significant. Interested in medication for cravings. Anxiety related to alcohol use noted. - Recommend consultation with psychologist or psychiatrist specializing in substance use disorders at [facility] or [facility]. - Discuss potential medication options for alcohol use disorder with psychiatrist.  Hypertension Chronic Elevated blood pressure readings noted. Home monitoring planned to confirm need for medication. - Repeat blood pressure measurement twice during visit. - Instruct home blood pressure monitoring twice daily for two weeks. - Schedule follow-up in two weeks to review readings and consider medication.  Pancreatic insufficiency Chronic Exocrine pancreatic insufficiency with low lipase levels. Concerns about enzyme dosage adequacy. - Continue enzyme replacement therapy with each meal. - Consider referral to [place].  Obesity Chronic Weight gain to obesity range due to sedentary lifestyle and lack of motivation. Underlying health issues need assessment. - Recommend blood work to assess hemoglobin, electrolytes, kidney and liver function, thyroid, diabetes, and cholesterol. - Encourage increased  physical activity and dietary changes.  General Health Maintenance No recent blood work or gynecological exam. History of partial hysterectomy. Needs comprehensive evaluation. - Order comprehensive blood work including vitamin D , B12, magnesium , and hormonal levels. - Refer to gynecology for evaluation of menopausal symptoms and reproductive health.  Hypertension Based on 2 different readings  - CBC with Differential/Platelet - Comprehensive metabolic panel with GFR - TSH - Hemoglobin A1c - Lipid panel - Magnesium  - Vitamin B12 - VITAMIN D  25 Hydroxy (Vit-D Deficiency, Fractures)  Pancreatic insufficiency  - Ambulatory referral to Gastroenterology  Tobacco use disorder Chronic Patient was advised to quit smoking  Will reassess at the next appt  Menopause  - FSH/LH - Estradiol   Orders Placed This Encounter  Procedures   CBC with Differential/Platelet   Comprehensive metabolic panel with GFR    Has the patient fasted?:   Yes   TSH   Hemoglobin A1c   Lipid panel    Has the patient fasted?:   Yes   Magnesium    Vitamin B12   VITAMIN D  25 Hydroxy (Vit-D Deficiency, Fractures)   FSH/LH   Estradiol   Ambulatory referral to Gastroenterology    Referral Priority:   Routine    Referral Type:   Consultation    Referral Reason:   Specialty Services Required    Number of Visits Requested:   1    No follow-ups on file.   The patient was advised to call back or seek an in-person evaluation if the symptoms worsen or if the condition fails to improve as anticipated.  I discussed the assessment and treatment plan with the patient. The patient was provided an opportunity to ask questions and all were answered. The patient agreed with the plan and demonstrated an understanding of the instructions.  I, Liticia Gasior, PA-C have reviewed all documentation for this visit. The documentation on 11/11/2023  for the exam, diagnosis, procedures, and orders are all accurate and  complete.  Blane Bunting, [facility], [facility] [phone] (phone) [phone] (fax)  [facility]

## 2023-11-13 ENCOUNTER — Other Ambulatory Visit: Payer: Self-pay | Admitting: Physician Assistant

## 2023-11-13 ENCOUNTER — Ambulatory Visit: Payer: Self-pay | Admitting: Physician Assistant

## 2023-11-13 DIAGNOSIS — N951 Menopausal and female climacteric states: Secondary | ICD-10-CM

## 2023-11-13 LAB — COMPREHENSIVE METABOLIC PANEL WITH GFR
ALT: 28 IU/L (ref 0–32)
AST: 33 IU/L (ref 0–40)
Albumin: 4.8 g/dL (ref 3.9–4.9)
Alkaline Phosphatase: 96 IU/L (ref 44–121)
BUN/Creatinine Ratio: 10 (ref 9–23)
BUN: 10 mg/dL (ref 6–24)
Bilirubin Total: 0.3 mg/dL (ref 0.0–1.2)
CO2: 16 mmol/L — ABNORMAL LOW (ref 20–29)
Calcium: 9.7 mg/dL (ref 8.7–10.2)
Chloride: 103 mmol/L (ref 96–106)
Creatinine, Ser: 0.99 mg/dL (ref 0.57–1.00)
Globulin, Total: 2.3 g/dL (ref 1.5–4.5)
Glucose: 86 mg/dL (ref 70–99)
Potassium: 4.7 mmol/L (ref 3.5–5.2)
Sodium: 140 mmol/L (ref 134–144)
Total Protein: 7.1 g/dL (ref 6.0–8.5)
eGFR: 71 mL/min/{1.73_m2} (ref >59)

## 2023-11-13 LAB — LIPID PANEL
Chol/HDL Ratio: 3.6 ratio (ref 0.0–4.4)
Cholesterol, Total: 231 mg/dL — ABNORMAL HIGH (ref 100–199)
HDL: 64 mg/dL (ref >39)
LDL Chol Calc (NIH): 103 mg/dL — ABNORMAL HIGH (ref 0–99)
Triglycerides: 385 mg/dL — ABNORMAL HIGH (ref 0–149)
VLDL Cholesterol Cal: 64 mg/dL — ABNORMAL HIGH (ref 5–40)

## 2023-11-13 LAB — HEMOGLOBIN A1C
Est. average glucose Bld gHb Est-mCnc: 103 mg/dL
Hgb A1c MFr Bld: 5.2 % (ref 4.8–5.6)

## 2023-11-13 LAB — CBC WITH DIFFERENTIAL/PLATELET
Basophils Absolute: 0 10*3/uL (ref 0.0–0.2)
Basos: 0 %
EOS (ABSOLUTE): 0.1 10*3/uL (ref 0.0–0.4)
Eos: 1 %
Hematocrit: 41.9 % (ref 34.0–46.6)
Hemoglobin: 14 g/dL (ref 11.1–15.9)
Immature Grans (Abs): 0 10*3/uL (ref 0.0–0.1)
Immature Granulocytes: 0 %
Lymphocytes Absolute: 2.8 10*3/uL (ref 0.7–3.1)
Lymphs: 23 %
MCH: 31.7 pg (ref 26.6–33.0)
MCHC: 33.4 g/dL (ref 31.5–35.7)
MCV: 95 fL (ref 79–97)
Monocytes Absolute: 0.5 10*3/uL (ref 0.1–0.9)
Monocytes: 5 %
Neutrophils Absolute: 8.6 10*3/uL — ABNORMAL HIGH (ref 1.4–7.0)
Neutrophils: 71 %
Platelets: 349 10*3/uL (ref 150–450)
RBC: 4.41 x10E6/uL (ref 3.77–5.28)
RDW: 12.2 % (ref 11.7–15.4)
WBC: 12.1 10*3/uL — ABNORMAL HIGH (ref 3.4–10.8)

## 2023-11-13 LAB — VITAMIN D 25 HYDROXY (VIT D DEFICIENCY, FRACTURES): Vit D, 25-Hydroxy: 25.1 ng/mL — ABNORMAL LOW (ref 30.0–100.0)

## 2023-11-13 LAB — TSH: TSH: 0.684 u[IU]/mL (ref 0.450–4.500)

## 2023-11-13 LAB — VITAMIN B12: Vitamin B-12: 456 pg/mL (ref 232–1245)

## 2023-11-13 LAB — FSH/LH
FSH: 16 m[IU]/mL
LH: 13.5 m[IU]/mL

## 2023-11-13 LAB — MAGNESIUM: Magnesium: 2 mg/dL (ref 1.6–2.3)

## 2023-11-13 LAB — ESTRADIOL: Estradiol: 61 pg/mL

## 2023-11-14 ENCOUNTER — Other Ambulatory Visit: Payer: Self-pay

## 2023-11-14 ENCOUNTER — Emergency Department

## 2023-11-14 ENCOUNTER — Emergency Department
Admission: EM | Admit: 2023-11-14 | Discharge: 2023-11-14 | Disposition: A | Discharge status: Home / Self Care | Attending: Emergency Medicine | Admitting: Emergency Medicine

## 2023-11-14 DIAGNOSIS — K2900 Acute gastritis without bleeding: Secondary | ICD-10-CM | POA: Insufficient documentation

## 2023-11-14 DIAGNOSIS — R0789 Other chest pain: Secondary | ICD-10-CM | POA: Diagnosis present

## 2023-11-14 LAB — COMPREHENSIVE METABOLIC PANEL WITH GFR
ALT: 24 U/L (ref 0–44)
AST: 31 U/L (ref 15–41)
Albumin: 4 g/dL (ref 3.5–5.0)
Alkaline Phosphatase: 61 U/L (ref 38–126)
Anion gap: 13 (ref 5–15)
BUN: 10 mg/dL (ref 6–20)
CO2: 20 mmol/L — ABNORMAL LOW (ref 22–32)
Calcium: 9 mg/dL (ref 8.9–10.3)
Chloride: 105 mmol/L (ref 98–111)
Creatinine, Ser: 0.8 mg/dL (ref 0.44–1.00)
GFR, Estimated: >60 mL/min (ref >60)
Glucose, Bld: 103 mg/dL — ABNORMAL HIGH (ref 70–99)
Potassium: 3.9 mmol/L (ref 3.5–5.1)
Sodium: 138 mmol/L (ref 135–145)
Total Bilirubin: 0.7 mg/dL (ref 0.0–1.2)
Total Protein: 7.5 g/dL (ref 6.5–8.1)

## 2023-11-14 LAB — CBC
HCT: 39.2 % (ref 36.0–46.0)
Hemoglobin: 13 g/dL (ref 12.0–15.0)
MCH: 31.3 pg (ref 26.0–34.0)
MCHC: 33.2 g/dL (ref 30.0–36.0)
MCV: 94.2 fL (ref 80.0–100.0)
Platelets: 261 10*3/uL (ref 150–400)
RBC: 4.16 MIL/uL (ref 3.87–5.11)
RDW: 11.9 % (ref 11.5–15.5)
WBC: 5.6 10*3/uL (ref 4.0–10.5)
nRBC: 0 % (ref 0.0–0.2)

## 2023-11-14 LAB — TROPONIN I (HIGH SENSITIVITY)
Troponin I (High Sensitivity): 3 ng/L (ref <18)
Troponin I (High Sensitivity): 4 ng/L (ref <18)

## 2023-11-14 LAB — LIPASE, BLOOD: Lipase: 40 U/L (ref 11–51)

## 2023-11-14 MED ORDER — ONDANSETRON HCL 4 MG/2ML IJ SOLN
4.0000 mg | Freq: Once | INTRAMUSCULAR | Status: AC
  Administered 2023-11-14: 4 mg via INTRAVENOUS
  Filled 2023-11-14: qty 2

## 2023-11-14 MED ORDER — SUCRALFATE 1 G PO TABS: 1.0000 g | ORAL_TABLET | Freq: Three times a day (TID) | ORAL | 0 refills | Status: DC

## 2023-11-14 MED ORDER — SODIUM CHLORIDE 0.9 % IV SOLN
12.5000 mg | Freq: Once | INTRAVENOUS | Status: AC
  Administered 2023-11-14: 12.5 mg via INTRAVENOUS
  Filled 2023-11-14: qty 12.5

## 2023-11-14 MED ORDER — MORPHINE SULFATE (PF) 4 MG/ML IV SOLN
4.0000 mg | Freq: Once | INTRAVENOUS | Status: AC
  Administered 2023-11-14: 4 mg via INTRAVENOUS
  Filled 2023-11-14: qty 1

## 2023-11-14 MED ORDER — PANTOPRAZOLE SODIUM 40 MG IV SOLR
40.0000 mg | Freq: Once | INTRAVENOUS | Status: AC
  Administered 2023-11-14: 40 mg via INTRAVENOUS
  Filled 2023-11-14: qty 10

## 2023-11-14 MED ORDER — IOHEXOL 300 MG/ML  SOLN
100.0000 mL | Freq: Once | INTRAMUSCULAR | Status: AC | PRN
  Administered 2023-11-14: 100 mL via INTRAVENOUS

## 2023-11-14 MED ORDER — ALUM & MAG HYDROXIDE-SIMETH 200-200-20 MG/5ML PO SUSP
30.0000 mL | Freq: Once | ORAL | Status: AC
  Administered 2023-11-14: 30 mL via ORAL
  Filled 2023-11-14: qty 30

## 2023-11-14 MED ORDER — LIDOCAINE VISCOUS HCL 2 % MT SOLN
15.0000 mL | Freq: Once | OROMUCOSAL | Status: AC
  Administered 2023-11-14: 15 mL via ORAL
  Filled 2023-11-14: qty 15

## 2023-11-14 NOTE — ED Triage Notes (Signed)
 Pt comes in via pov with complaints of chest and upper back pain that started yesterday. Pt states that she has pancreatic insufficiency. Pt is unable to get into her primary care provider.

## 2023-11-14 NOTE — ED Provider Notes (Signed)
 [facility] Provider Note    Event Date/Time   First MD Initiated Contact with Patient 11/14/23 [phone]     (approximate)   History   Chest Pain   HPI  [name] is a 48 y.o. female with a history of pancreatic insufficiency, cholecystectomy, appendectomy, hysterectomy who presents with complaints of epigastric discomfort as well as chest discomfort.  Patient reports several days ago she developed pressure in her upper chest which has been nearly constant.  No shortness of breath, no cough, no fevers.  Last night she developed epigastric burning discomfort which is moderate to severe.     Physical Exam   Triage Vital Signs: ED Triage Vitals  Encounter Vitals Group     BP 11/14/23 0931 138/83     Girls Systolic BP Percentile --      Girls Diastolic BP Percentile --      Boys Systolic BP Percentile --      Boys Diastolic BP Percentile --      Pulse Rate 11/14/23 0931 80     Resp 11/14/23 0931 17     Temp 11/14/23 0931 98.8 F (37.1 C)     Temp Source 11/14/23 0931 Oral     SpO2 11/14/23 0931 100 %     Weight 11/14/23 0938 88.5 kg (195 lb)     Height 11/14/23 0938 1.676 m (5' 6)     Head Circumference --      Peak Flow --      Pain Score 11/14/23 0937 8     Pain Loc --      Pain Education --      Exclude from Growth Chart --     Most recent vital signs: Vitals:   11/14/23 1230 11/14/23 1330  BP: 131/85 110/74  Pulse: 74 68  Resp: 14 15  Temp:    SpO2: 98% 97%     General: Awake, no distress.  CV:  Good peripheral perfusion.  Resp:  Normal effort.  Abd:  No distention.  Tenderness in the epigastrium Other:     ED Results / Procedures / Treatments   Labs (all labs ordered are listed, but only abnormal results are displayed) Labs Reviewed  COMPREHENSIVE METABOLIC PANEL WITH GFR - Abnormal; Notable for the following components:      Result Value   CO2 20 (*)    Glucose, Bld 103 (*)    All other components within normal  limits  LIPASE, BLOOD  CBC  TROPONIN I (HIGH SENSITIVITY)  TROPONIN I (HIGH SENSITIVITY)     EKG  ED ECG REPORT I, Bryson Carbine, the attending physician, personally viewed and interpreted this ECG.  Date: 11/14/2023  Rhythm: normal sinus rhythm QRS Axis: normal Intervals: normal ST/T Wave abnormalities: normal Narrative Interpretation: no evidence of acute ischemia    RADIOLOGYChest x-ray viewed interpret by me, no acute abnormality   PROCEDURES:  Critical Care performed:   Procedures   MEDICATIONS ORDERED IN ED: Medications  morphine  (PF) 4 MG/ML injection 4 mg (4 mg Intravenous Given 11/14/23 1042)  ondansetron  (ZOFRAN ) injection 4 mg (4 mg Intravenous Given 11/14/23 1042)  morphine  (PF) 4 MG/ML injection 4 mg (4 mg Intravenous Given 11/14/23 1207)  iohexol  (OMNIPAQUE ) 300 MG/ML solution 100 mL (100 mLs Intravenous Contrast Given 11/14/23 1213)  promethazine  (PHENERGAN ) 12.5 mg in sodium chloride  0.9 % 50 mL IVPB (0 mg Intravenous Stopped 11/14/23 1357)  pantoprazole  (PROTONIX ) injection 40 mg (40 mg Intravenous Given 11/14/23 1246)  alum &  mag hydroxide-simeth (MAALOX/MYLANTA) 200-200-20 MG/5ML suspension 30 mL (30 mLs Oral Given 11/14/23 1246)    And  lidocaine  (XYLOCAINE ) 2 % viscous mouth solution 15 mL (15 mLs Oral Given 11/14/23 1246)     IMPRESSION / MDM / ASSESSMENT AND PLAN / ED COURSE  I reviewed the triage vital signs and the nursing notes. Patient's presentation is most consistent with acute presentation with potential threat to life or bodily function.  Patient presents with chest pain and epigastric pain as detailed above, this did not necessarily appear to be related, chest pain started while before epigastric discomfort.  Differential includes ACS, pneumonia, does not appear consistent with dissection.  Possibility of gastritis, pancreatitis, PUD.  Will treat with IV morphine , IV Zofran , obtain labs including lipase, high-sensitivity troponin, chest  x-ray.  Patient had little improvement with IV morphine  or IV Zofran , even after a second dose.  Lab work was quite reassuring.  Sent for CT scan which did not demonstrate any acute abnormality  Patient actually had near resolution of discomfort with GI cocktail and IV Protonix , she does have a history of H. pylori gastritis.  Question gastritis versus PUD.  She has outpatient follow-up with GI, will start Carafate , she will continue omeprazole  40 mg      FINAL CLINICAL IMPRESSION(S) / ED DIAGNOSES   Final diagnoses:  Atypical chest pain  Acute gastritis without hemorrhage, unspecified gastritis type     Rx / DC Orders   ED Discharge Orders          Ordered    sucralfate  (CARAFATE ) 1 g tablet  3 times daily with meals & bedtime        11/14/23 1346             Note:  This document was prepared using Dragon voice recognition software and may include unintentional dictation errors.   Bryson Carbine, MD 11/14/23 [phone]

## 2023-11-19 ENCOUNTER — Ambulatory Visit (INDEPENDENT_AMBULATORY_CARE_PROVIDER_SITE_OTHER): Admitting: Physician Assistant

## 2023-11-19 VITALS — BP 132/91 | HR 74 | Resp 16 | Ht 67.0 in | Wt 194.0 lb

## 2023-11-19 DIAGNOSIS — S39012D Strain of muscle, fascia and tendon of lower back, subsequent encounter: Secondary | ICD-10-CM | POA: Diagnosis not present

## 2023-11-19 DIAGNOSIS — I10 Essential (primary) hypertension: Secondary | ICD-10-CM

## 2023-11-19 DIAGNOSIS — S39012A Strain of muscle, fascia and tendon of lower back, initial encounter: Secondary | ICD-10-CM | POA: Insufficient documentation

## 2023-11-19 DIAGNOSIS — F1020 Alcohol dependence, uncomplicated: Secondary | ICD-10-CM

## 2023-11-19 DIAGNOSIS — F419 Anxiety disorder, unspecified: Secondary | ICD-10-CM | POA: Diagnosis not present

## 2023-11-19 DIAGNOSIS — F339 Major depressive disorder, recurrent, unspecified: Secondary | ICD-10-CM

## 2023-11-19 DIAGNOSIS — E7849 Other hyperlipidemia: Secondary | ICD-10-CM

## 2023-11-19 MED ORDER — LISINOPRIL 10 MG PO TABS: 10.0000 mg | ORAL_TABLET | Freq: Every day | ORAL | 3 refills | Status: AC

## 2023-11-19 MED ORDER — FLUOXETINE HCL 20 MG PO CAPS: 20.0000 mg | ORAL_CAPSULE | Freq: Every day | ORAL | 3 refills | Status: DC

## 2023-11-19 MED ORDER — EZETIMIBE 10 MG PO TABS: 10.0000 mg | ORAL_TABLET | Freq: Every day | ORAL | 3 refills | Status: AC

## 2023-11-19 MED ORDER — GABAPENTIN 300 MG PO CAPS: 300.0000 mg | ORAL_CAPSULE | Freq: Two times a day (BID) | ORAL | 1 refills | Status: AC

## 2023-11-19 NOTE — Progress Notes (Signed)
 Established patient visit  Patient: [name]   DOB: 1976/04/15   48 y.o. Female  MRN: [id number] Visit Date: 11/19/2023  Today's healthcare provider: Blane Bunting, PA-C   Chief Complaint  Patient presents with   Follow-up    F/u pt states she been feeling with headaches and BP are still not changing. Pt also fell Saturday and unable to sleep.   Subjective     HPI     Follow-up    Additional comments: F/u pt states she been feeling with headaches and BP are still not changing. Pt also fell Saturday and unable to sleep.      Last edited by Estill Hemming, CMA on 11/19/2023  4:08 PM.       Discussed the use of AI scribe software for clinical note transcription with the patient, who gave verbal consent to proceed.  History of Present Illness Rochanda Harpham [name] is a 48 year old female with hypertension who presents with headaches and elevated blood pressure.  She has experienced headaches for the past week, described as pounding and pushing across her head, with associated blurry vision, nausea, and lightheadedness. Her blood pressure at home was 160/107 this morning. She is not on antihypertensive medication but has started taking a baby aspirin due to high readings. She stopped alcohol consumption last week.  She visited the ER on Friday, where she was advised to consider an upper endoscopy for possible ulcers. She has a history of H. pylori infections and experiences diarrhea. She takes omeprazole , Carafate , and Protonix  for reflux and ulcer management.  She has depression and anxiety, currently managed with Prozac , which she feels is less effective. She reduced smoking to half a pack a day. She experienced a fall on Saturday night, resulting in skin loss on her arms, attributed to lightheadedness and headache.  She has a history of back pain with radiation down her left leg, not currently on gabapentin . She reports rectal pain, a sensation of heaviness, and urinary  incontinence when coughing or sneezing.       11/11/2023    3:35 PM 07/04/2023    1:56 PM 03/13/2023    2:42 PM  Depression screen PHQ 2/9  Decreased Interest 1 0 0  Down, Depressed, Hopeless 1 0 0  PHQ - 2 Score 2 0 0  Altered sleeping 2 0 0  Tired, decreased energy 3 0 0  Change in appetite 0 0 0  Feeling bad or failure about yourself  0 0 0  Trouble concentrating 2 0 0  Moving slowly or fidgety/restless 0 0 0  Suicidal thoughts 0 0 0  PHQ-9 Score 9 0 0  Difficult doing work/chores   Not difficult at all      11/11/2023    3:35 PM 07/20/2020    8:09 AM  GAD 7 : Generalized Anxiety Score  Nervous, Anxious, on Edge 1 2  Control/stop worrying 0 3  Worry too much - different things 1 3  Trouble relaxing 1 1  Restless 0 0  Easily annoyed or irritable 2 2  Afraid - awful might happen 0 1  Total GAD 7 Score 5 12  Anxiety Difficulty Somewhat difficult Somewhat difficult    Medications: Outpatient Medications Prior to Visit  Medication Sig   Blood Glucose Monitoring Suppl DEVI 1 each by Does not apply route 2 (two) times daily as needed. May substitute to any manufacturer covered by patient's insurance.   FLUoxetine  (PROZAC ) 40 MG capsule Take 1 capsule (40 mg  total) by mouth daily.   methocarbamol  (ROBAXIN ) 750 MG tablet Take 1 tablet (750 mg total) by mouth every 8 (eight) hours as needed for muscle spasms.   omeprazole  (PRILOSEC) 40 MG capsule Take 1 capsule by mouth once daily   Pancrelipase , Lip-Prot-Amyl, (CREON ) 24000-76000 units CPEP TAKE 1 CAPSULE BY MOUTH WITH THE FIRST BITE OF EACH MEAL AND 1 CAPSULE WITH THE FIRST BITE OF EACH SNACK   promethazine  (PHENERGAN ) 12.5 MG tablet Take 1 tablet (12.5 mg total) by mouth every 8 (eight) hours as needed for nausea or vomiting.   sucralfate  (CARAFATE ) 1 g tablet Take 1 tablet (1 g total) by mouth 4 (four) times daily -  with meals and at bedtime for 15 days.   traZODone  (DESYREL ) 100 MG tablet Take 2 tablets (200 mg total) by  mouth at bedtime.   valACYclovir  (VALTREX ) 500 MG tablet Take 2 tablets (1,000 mg total) by mouth 2 (two) times daily.   [DISCONTINUED] gabapentin  (NEURONTIN ) 300 MG capsule Take 1 capsule (300 mg total) by mouth at bedtime.   No facility-administered medications prior to visit.    Review of Systems All negative Except see HPI       Objective    BP (!) 132/91 (BP Location: Right Arm, Patient Position: Sitting, Cuff Size: Normal)   Pulse 74   Resp 16   Ht 5' 7 (1.702 m)   Wt 194 lb (88 kg)   SpO2 100%   BMI 30.38 kg/m     Physical Exam Vitals reviewed.  Constitutional:      General: She is not in acute distress.    Appearance: Normal appearance. She is well-developed. She is not diaphoretic.  HENT:     Head: Normocephalic and atraumatic.   Eyes:     General: No scleral icterus.    Conjunctiva/sclera: Conjunctivae normal.   Neck:     Thyroid: No thyromegaly.   Cardiovascular:     Rate and Rhythm: Normal rate and regular rhythm.     Pulses: Normal pulses.     Heart sounds: Normal heart sounds. No murmur heard. Pulmonary:     Effort: Pulmonary effort is normal. No respiratory distress.     Breath sounds: Normal breath sounds. No wheezing, rhonchi or rales.   Musculoskeletal:     Cervical back: Neck supple.     Right lower leg: No edema.     Left lower leg: No edema.  Lymphadenopathy:     Cervical: No cervical adenopathy.   Skin:    General: Skin is warm and dry.     Findings: No rash.   Neurological:     Mental Status: She is alert and oriented to person, place, and time. Mental status is at baseline.   Psychiatric:        Mood and Affect: Mood normal.        Behavior: Behavior normal.      No results found for any visits on 11/19/23.      Assessment & Plan Hypertension Chronic hypertension with recent readings of 160/107 mmHg. Symptoms may relate to elevated blood pressure. No current antihypertensive medication. Explained potential side  effects of starting lisinopril. - Prescribe lisinopril at a low dose. - Advise home blood pressure monitoring and bringing the cuff to the next appointment. - Discuss the importance of avoiding alcohol while on aspirin due to bleeding risk. - Educate on potential initial discomfort from medication.  Depression and Anxiety Chronic depression and anxiety with current treatment of Prozac , which  is less effective. Recent increase in anxiety possibly related to lifestyle changes and health concerns. Previous trial of Wellbutrin  was not tolerated. Discussed potential benefits of visiting [facility] for urgent behavioral health support and assistance with alcohol cessation. - Encourage visiting [facility] for urgent behavioral health support and assistance with alcohol cessation. - Continue Prozac  and monitor symptoms. Increase a dose of prozac  to 60mg  daily  Gastrointestinal Issues H. pylori infections and ulcers with symptoms of diarrhea and epigastric tightness. Recent ER visit suggested possible ulcers. Discussed risk of bleeding ulcers with continued aspirin use and advised against it. - Continue omeprazole  40 mg once daily and Carafate . - Follow up with GI specialist for further evaluation and potential upper endoscopy. - Advise against aspirin use due to ulcer risk.  Hyperlipidemia Chronic Elevated cholesterol levels with previous intolerance to statins due to muscle pain. High triglycerides noted. Discussed use of Zetia  as an alternative to statins and benefits of fish oil for triglyceride management. - Prescribe Zetia  after starting lisinopril and ensuring tolerance. - Recommend daily fish oil (Omega-3) for triglyceride management.  Chronic Back Pain Chronic back pain with previous surgery in 2014. Current pain radiates down the left leg, possibly due to nerve compression. Considering nerve ablation as a treatment option. Discussed potential benefits of early surgical intervention versus waiting. -  Prescribe gabapentin  for sciatica pain, to be taken twice daily as needed. - Encourage consultation with a neurosurgeon or orthopedic surgeon for further evaluation and potential surgical options.  General Health Maintenance Actively working on lifestyle modifications, including reducing alcohol consumption and smoking cessation. Discussed benefits of weight loss and alcohol cessation for overall health improvement. - Encourage continued smoking cessation efforts. - Advise on benefits of weight loss and alcohol cessation for overall health improvement. Reviewed labs  Alcohol use disorder Sober for a week.  Has been drinking 3-4 can of beers 3-4 times per week Will revisit  No orders of the defined types were placed in this encounter.   Return in about 4 weeks (around 12/17/2023) for chronic disease f/u.   The patient was advised to call back or seek an in-person evaluation if the symptoms worsen or if the condition fails to improve as anticipated.  I discussed the assessment and treatment plan with the patient. The patient was provided an opportunity to ask questions and all were answered. The patient agreed with the plan and demonstrated an understanding of the instructions.  I, Windel Keziah, PA-C have reviewed all documentation for this visit. The documentation on 11/19/2023  for the exam, diagnosis, procedures, and orders are all accurate and complete.  Blane Bunting, [facility], MMS [facility] [phone] (phone) [phone] (fax)  [facility]

## 2023-11-27 ENCOUNTER — Ambulatory Visit: Admitting: Physician Assistant

## 2023-12-02 ENCOUNTER — Telehealth: Payer: Self-pay | Admitting: Gastroenterology

## 2023-12-02 NOTE — Telephone Encounter (Signed)
 OK for OV in APP clinic please RG

## 2023-12-02 NOTE — Telephone Encounter (Signed)
 Good afternoon Dr. Charlanne,   DOD PM 7/1  Patient is requesting to be seen for Pancreatic insufficiency. Patient has previous history with [facility]. States her provider is going to [facility]. Also states her symptoms have not been improving. Please review and advise on transfer of care request.   Thank you.

## 2023-12-08 ENCOUNTER — Encounter: Payer: Self-pay | Admitting: Physician Assistant

## 2023-12-08 NOTE — Telephone Encounter (Signed)
Called patient to discuss scheduling. Left voicemail.

## 2023-12-22 ENCOUNTER — Encounter: Payer: Self-pay | Admitting: Physician Assistant

## 2023-12-22 DIAGNOSIS — G4709 Other insomnia: Secondary | ICD-10-CM

## 2023-12-23 NOTE — Telephone Encounter (Signed)
 [facility] faxed refill request for the following medications:   traZODone  (DESYREL ) 100 MG tablet    Please advise

## 2023-12-24 ENCOUNTER — Telehealth: Payer: Self-pay | Admitting: Physician Assistant

## 2023-12-24 MED ORDER — TRAZODONE HCL 100 MG PO TABS: 200.0000 mg | ORAL_TABLET | Freq: Every day | ORAL | 0 refills | Status: DC

## 2023-12-24 NOTE — Telephone Encounter (Signed)
 Please, check with pt if she was able to see [facility]? Please, let her know that this is a courtesy refill before she will see behavioral health for evaluations and medications adjustments.  Advised to avoid consumption of alcohol and do not take trazodone  at the same time as Prozac  is a skin increase risk of CNS depression and serotonin syndrome.

## 2023-12-25 ENCOUNTER — Encounter: Payer: Self-pay | Admitting: Advanced Practice Midwife

## 2023-12-25 ENCOUNTER — Other Ambulatory Visit (HOSPITAL_COMMUNITY)
Admission: RE | Admit: 2023-12-25 | Discharge: 2023-12-25 | Disposition: A | Discharge status: Home / Self Care | Source: Ambulatory Visit | Attending: Advanced Practice Midwife | Admitting: Advanced Practice Midwife

## 2023-12-25 ENCOUNTER — Ambulatory Visit: Admitting: Advanced Practice Midwife

## 2023-12-25 VITALS — BP 124/67 | HR 68 | Ht 66.5 in | Wt 199.2 lb

## 2023-12-25 DIAGNOSIS — Z1272 Encounter for screening for malignant neoplasm of vagina: Secondary | ICD-10-CM | POA: Insufficient documentation

## 2023-12-25 DIAGNOSIS — N951 Menopausal and female climacteric states: Secondary | ICD-10-CM | POA: Diagnosis not present

## 2023-12-25 MED ORDER — ESTROGENS CONJUGATED 0.625 MG PO TABS
0.6250 mg | ORAL_TABLET | Freq: Every day | ORAL | 11 refills | Status: DC
Start: 2023-12-25 — End: 2023-12-26

## 2023-12-26 ENCOUNTER — Encounter: Payer: Self-pay | Admitting: Advanced Practice Midwife

## 2023-12-26 LAB — TESTOSTERONE,FREE AND TOTAL
Testosterone, Free: 5.4 pg/mL — ABNORMAL HIGH (ref 0.0–4.2)
Testosterone: 304 ng/dL — ABNORMAL HIGH (ref 4–50)

## 2023-12-26 LAB — PROGESTERONE: Progesterone: 0.2 ng/mL

## 2023-12-26 LAB — ESTRADIOL: Estradiol: 23.1 pg/mL

## 2023-12-26 LAB — FSH/LH
FSH: 16.8 m[IU]/mL
LH: 9.2 m[IU]/mL

## 2023-12-26 NOTE — Progress Notes (Addendum)
 Patient ID: [name], female   DOB: [birth year], 48 y.o.   MRN: [id number]  Reason for Visit: Hot Flashes (Night sweats, weight gain, mood swings, facial hair, perimenopause?)   Subjective:  Date of Service: 12/25/2023  HPI:  [name] is a 48 y.o. female being seen regarding perimenopausal symptoms. She has a history of partial hysterectomy in 2007. In the past year she is experiencing hot flashes/night sweats that are disruptive of sleep- has tried black cohosh without relief-, mood changes, weight gain, and change in quality of facial hair. She is having daily headaches. She has decreased libido and no orgasm in past 12 months.   Past medical history includes pancreatic insufficiency, hypercholesterolemia. She reports diet and hydration are ok. Has some physical activity- gardening, house cleaning. Being outside is stress reducing. Reports increased stress from her job. She smokes 1/2 PPD and is trying to quit. Last PAP in 2007.  Discussed options for treating symptoms of perimenopause including lifestyle changes, OTC supplements, prescription medications. She is interested in checking labs to determine menopause status. Recommend against the use of estrogen products until she is no longer using tobacco due to risk of blood clots. She has a new partner in the past few years and agrees to vaginal cancer screening today.   Past Medical History:  Diagnosis Date   BRCA negative 07/2018   MyRisk neg   Family history of breast cancer    IBIS=13%/riskscore=8.4%   Family history of ovarian cancer    Family history of pancreatic cancer    GERD (gastroesophageal reflux disease)    History of frequent urinary tract infections    Hypoglycemia    IBS (irritable bowel syndrome)    Migraine headache    linger for days when they happen   Vertigo    none for several years   Family History  Problem Relation Age of Onset   Hypertension Mother    COPD Mother    Epilepsy Mother     Sleep apnea Mother    Hypertension Father    Sleep apnea Father    Breast cancer Maternal Aunt 65   Ovarian cancer Maternal Aunt 45   Colon cancer Paternal Uncle 50   Pancreatic cancer Maternal Grandmother 61   Colon cancer Maternal Grandfather 66   Past Surgical History:  Procedure Laterality Date   ABDOMINAL HYSTERECTOMY  07/2006   PARTIAL   APPENDECTOMY     BACK SURGERY     CHOLECYSTECTOMY     COLONOSCOPY WITH PROPOFOL  N/A 11/01/2022   Procedure: COLONOSCOPY WITH PROPOFOL ;  Surgeon: Unk Corinn Skiff, MD;  Location: [facility];  Service: Gastroenterology;  Laterality: N/A;   DIAGNOSTIC LAPAROSCOPY     test on bladder   ESOPHAGOGASTRODUODENOSCOPY (EGD) WITH PROPOFOL  N/A 06/25/2022   Procedure: ESOPHAGOGASTRODUODENOSCOPY (EGD) WITH PROPOFOL ;  Surgeon: Unk Corinn Skiff, MD;  Location: [facility];  Service: Endoscopy;  Laterality: N/A;   GALLBLADDER SURGERY     LAPAROSCOPIC APPENDECTOMY N/A 12/08/2016   Procedure: APPENDECTOMY LAPAROSCOPIC;  Surgeon: Jordis Laneta FALCON, MD;  Location: [facility] ORS;  Service: General;  Laterality: N/A;   MAXIMUM ACCESS (MAS)POSTERIOR LUMBAR INTERBODY FUSION (PLIF) 1 LEVEL N/A 05/06/2013   Procedure: LUMBAR FIVE-SACRAL ONE MAXIMUM ACCESS POSTERIOR LUMBAR INTERBODY FUSION;  Surgeon: Alm GORMAN Molt, MD;  Location: [facility];  Service: Neurosurgery;  Laterality: N/A;   TOE SURGERY     TUBAL LIGATION     WISDOM TOOTH EXTRACTION      Short Social History:  Social  History   Tobacco Use   Smoking status: Every Day    Current packs/day: 0.50    Average packs/day: 0.7 packs/day for 30.2 years (22.6 ttl pk-yrs)    Types: Cigarettes    Start date: 10/25/2023   Smokeless tobacco: Never  Substance Use Topics   Alcohol use: Yes    Alcohol/week: 4.0 standard drinks of alcohol    Types: 4 Cans of beer per week    Allergies  Allergen Reactions   Statins Other (See Comments)    myopathy   Codeine  Nausea And Vomiting   Sulfa Antibiotics Rash    Vicodin [Hydrocodone -Acetaminophen ] Nausea Only    And itching all over    Current Outpatient Medications  Medication Sig Dispense Refill   Blood Glucose Monitoring Suppl DEVI 1 each by Does not apply route 2 (two) times daily as needed. May substitute to any manufacturer covered by patient's insurance. 1 each 0   ezetimibe  (ZETIA ) 10 MG tablet Take 1 tablet (10 mg total) by mouth daily. 90 tablet 3   FLUoxetine  (PROZAC ) 20 MG capsule Take 1 capsule (20 mg total) by mouth daily. 30 capsule 3   FLUoxetine  (PROZAC ) 40 MG capsule Take 1 capsule (40 mg total) by mouth daily. 90 capsule 3   gabapentin  (NEURONTIN ) 300 MG capsule Take 1 capsule (300 mg total) by mouth 2 (two) times daily. 60 capsule 1   lisinopril  (ZESTRIL ) 10 MG tablet Take 1 tablet (10 mg total) by mouth daily. 90 tablet 3   methocarbamol  (ROBAXIN ) 750 MG tablet Take 1 tablet (750 mg total) by mouth every 8 (eight) hours as needed for muscle spasms. 30 tablet 1   omeprazole  (PRILOSEC) 40 MG capsule Take 1 capsule by mouth once daily 30 capsule 1   Pancrelipase , Lip-Prot-Amyl, (CREON ) 24000-76000 units CPEP TAKE 1 CAPSULE BY MOUTH WITH THE FIRST BITE OF EACH MEAL AND 1 CAPSULE WITH THE FIRST BITE OF EACH SNACK 150 capsule 6   promethazine  (PHENERGAN ) 12.5 MG tablet Take 1 tablet (12.5 mg total) by mouth every 8 (eight) hours as needed for nausea or vomiting. 30 tablet 0   traZODone  (DESYREL ) 100 MG tablet Take 2 tablets (200 mg total) by mouth at bedtime. 60 tablet 0   valACYclovir  (VALTREX ) 500 MG tablet Take 2 tablets (1,000 mg total) by mouth 2 (two) times daily. 20 tablet 11   sucralfate  (CARAFATE ) 1 g tablet Take 1 tablet (1 g total) by mouth 4 (four) times daily -  with meals and at bedtime for 15 days. (Patient not taking: Reported on 12/25/2023) 60 tablet 0   No current facility-administered medications for this visit.    Review of Systems  Constitutional:  Negative for chills and fever.       Positive for weight gain,  decreased libido, inability to orgasm  HENT:  Negative for congestion, ear discharge, ear pain, hearing loss, sinus pain and sore throat.   Eyes:  Negative for blurred vision and double vision.  Respiratory:  Negative for cough, shortness of breath and wheezing.   Cardiovascular:  Negative for chest pain, palpitations and leg swelling.  Gastrointestinal:  Negative for abdominal pain, blood in stool, constipation, diarrhea, heartburn, melena, nausea and vomiting.  Genitourinary:  Negative for dysuria, flank pain, frequency, hematuria and urgency.  Musculoskeletal:  Negative for back pain, joint pain and myalgias.  Skin:  Negative for itching and rash.  Neurological:  Negative for dizziness, tingling, tremors, sensory change, speech change, focal weakness, seizures, loss of consciousness, weakness and  headaches.  Endo/Heme/Allergies:  Negative for environmental allergies. Does not bruise/bleed easily.       Positive for hot flashes, increased facial hair  Psychiatric/Behavioral:  Negative for depression, hallucinations, memory loss, substance abuse and suicidal ideas. The patient is not nervous/anxious and does not have insomnia.        Positive for mood changes        Objective:  Objective   Vitals:   12/25/23 1351  BP: 124/67  Pulse: 68  Weight: 199 lb 3.2 oz (90.4 kg)  Height: 5' 6.5 (1.689 m)   Body mass index is 31.67 kg/m. Constitutional: obese female in no acute distress.  HEENT: normal Skin: Warm and dry.  Cardiovascular: Regular rate and rhythm.   Respiratory:  Normal respiratory effort Psych: Alert and Oriented x3. No memory deficits. Mildly frustrated by symptoms.   Patient self collected vaginal specimen with broom for vaginal cancer screen   Assessment/Plan:     48 y.o. G2 P2 female with perimenopause symptoms (possible postmenopausal)  Labs: progesterone , estradiol , FSH/LH, testosterone  Follow up when labs result Screening for vaginal cancer (cervix  surgically removed) OTC Amberen for symptoms Consider estrogen supplement if she quits smoking Lifestyle modifications; diet, physical activity   Slater Rains, CNM [facility] 12/26/2023 1:17 PM

## 2023-12-29 NOTE — Progress Notes (Unsigned)
 Established patient visit  Patient: [name]   DOB: February 26, 1976   48 y.o. Female  MRN: [id number] Visit Date: 12/30/2023  Today's healthcare provider: Jolynn Spencer, PA-C   No chief complaint on file.  Subjective       Discussed the use of AI scribe software for clinical note transcription with the patient, who gave verbal consent to proceed.  History of Present Illness        11/11/2023    3:35 PM 07/04/2023    1:56 PM 03/13/2023    2:42 PM  Depression screen PHQ 2/9  Decreased Interest 1 0 0  Down, Depressed, Hopeless 1 0 0  PHQ - 2 Score 2 0 0  Altered sleeping 2 0 0  Tired, decreased energy 3 0 0  Change in appetite 0 0 0  Feeling bad or failure about yourself  0 0 0  Trouble concentrating 2 0 0  Moving slowly or fidgety/restless 0 0 0  Suicidal thoughts 0 0 0  PHQ-9 Score 9 0 0  Difficult doing work/chores   Not difficult at all      11/11/2023    3:35 PM 07/20/2020    8:09 AM  GAD 7 : Generalized Anxiety Score  Nervous, Anxious, on Edge 1 2  Control/stop worrying 0 3  Worry too much - different things 1 3  Trouble relaxing 1 1  Restless 0 0  Easily annoyed or irritable 2 2  Afraid - awful might happen 0 1  Total GAD 7 Score 5 12  Anxiety Difficulty Somewhat difficult Somewhat difficult    Medications: Outpatient Medications Prior to Visit  Medication Sig  . Blood Glucose Monitoring Suppl DEVI 1 each by Does not apply route 2 (two) times daily as needed. May substitute to any manufacturer covered by patient's insurance.  . ezetimibe  (ZETIA ) 10 MG tablet Take 1 tablet (10 mg total) by mouth daily.  . FLUoxetine  (PROZAC ) 20 MG capsule Take 1 capsule (20 mg total) by mouth daily.  . FLUoxetine  (PROZAC ) 40 MG capsule Take 1 capsule (40 mg total) by mouth daily.  . gabapentin  (NEURONTIN ) 300 MG capsule Take 1 capsule (300 mg total) by mouth 2 (two) times daily.  . lisinopril  (ZESTRIL ) 10 MG tablet Take 1 tablet (10 mg total) by mouth daily.  .  methocarbamol  (ROBAXIN ) 750 MG tablet Take 1 tablet (750 mg total) by mouth every 8 (eight) hours as needed for muscle spasms.  . omeprazole  (PRILOSEC) 40 MG capsule Take 1 capsule by mouth once daily  . Pancrelipase , Lip-Prot-Amyl, (CREON ) 24000-76000 units CPEP TAKE 1 CAPSULE BY MOUTH WITH THE FIRST BITE OF EACH MEAL AND 1 CAPSULE WITH THE FIRST BITE OF EACH SNACK  . promethazine  (PHENERGAN ) 12.5 MG tablet Take 1 tablet (12.5 mg total) by mouth every 8 (eight) hours as needed for nausea or vomiting.  . sucralfate  (CARAFATE ) 1 g tablet Take 1 tablet (1 g total) by mouth 4 (four) times daily -  with meals and at bedtime for 15 days. (Patient not taking: Reported on 12/25/2023)  . traZODone  (DESYREL ) 100 MG tablet Take 2 tablets (200 mg total) by mouth at bedtime.  . valACYclovir  (VALTREX ) 500 MG tablet Take 2 tablets (1,000 mg total) by mouth 2 (two) times daily.   No facility-administered medications prior to visit.    Review of Systems All negative Except see HPI   {Insert previous labs (optional):23779} {See past labs  Heme  Chem  Endocrine  Serology  Results Review (optional):1}  Objective    There were no vitals taken for this visit. {Insert last BP/Wt (optional):23777}{See vitals history (optional):1}   Physical Exam   No results found for any visits on 12/30/23.      Assessment and Plan Assessment & Plan     No orders of the defined types were placed in this encounter.   No follow-ups on file.   The patient was advised to call back or seek an in-person evaluation if the symptoms worsen or if the condition fails to improve as anticipated.  I discussed the assessment and treatment plan with the patient. The patient was provided an opportunity to ask questions and all were answered. The patient agreed with the plan and demonstrated an understanding of the instructions.  I, Javen Ridings, PA-C have reviewed all documentation for this visit. The documentation on  12/30/2023  for the exam, diagnosis, procedures, and orders are all accurate and complete.  Jolynn Spencer, [facility], MMS [facility] [phone] (phone) [phone] (fax)  [facility]

## 2023-12-30 ENCOUNTER — Encounter: Payer: Self-pay | Admitting: Physician Assistant

## 2023-12-30 ENCOUNTER — Encounter (INDEPENDENT_AMBULATORY_CARE_PROVIDER_SITE_OTHER): Admitting: Physician Assistant

## 2023-12-30 DIAGNOSIS — K8689 Other specified diseases of pancreas: Secondary | ICD-10-CM

## 2023-12-30 DIAGNOSIS — F419 Anxiety disorder, unspecified: Secondary | ICD-10-CM

## 2023-12-30 DIAGNOSIS — F17200 Nicotine dependence, unspecified, uncomplicated: Secondary | ICD-10-CM

## 2023-12-30 DIAGNOSIS — G4709 Other insomnia: Secondary | ICD-10-CM

## 2023-12-30 DIAGNOSIS — E7849 Other hyperlipidemia: Secondary | ICD-10-CM

## 2023-12-30 DIAGNOSIS — F1020 Alcohol dependence, uncomplicated: Secondary | ICD-10-CM

## 2023-12-30 DIAGNOSIS — Z780 Asymptomatic menopausal state: Secondary | ICD-10-CM

## 2023-12-30 DIAGNOSIS — S39012D Strain of muscle, fascia and tendon of lower back, subsequent encounter: Secondary | ICD-10-CM

## 2023-12-30 DIAGNOSIS — N951 Menopausal and female climacteric states: Secondary | ICD-10-CM

## 2023-12-30 DIAGNOSIS — I10 Essential (primary) hypertension: Secondary | ICD-10-CM

## 2023-12-30 DIAGNOSIS — F339 Major depressive disorder, recurrent, unspecified: Secondary | ICD-10-CM

## 2024-01-01 ENCOUNTER — Encounter: Payer: Self-pay | Admitting: Advanced Practice Midwife

## 2024-01-02 LAB — CYTOLOGY - PAP
Comment: NEGATIVE
Diagnosis: NEGATIVE
Diagnosis: REACTIVE
High risk HPV: POSITIVE — AB

## 2024-01-03 ENCOUNTER — Ambulatory Visit: Payer: Self-pay | Admitting: Advanced Practice Midwife

## 2024-01-10 NOTE — Progress Notes (Deleted)
 Established patient visit  Patient: [name]   DOB: 1975/12/05   48 y.o. Female  MRN: [id number] Visit Date: 01/13/2024  Today's healthcare provider: Jolynn Spencer, PA-C   No chief complaint on file.  Subjective       Discussed the use of AI scribe software for clinical note transcription with the patient, who gave verbal consent to proceed.  History of Present Illness        11/11/2023    3:35 PM 07/04/2023    1:56 PM 03/13/2023    2:42 PM  Depression screen PHQ 2/9  Decreased Interest 1 0 0  Down, Depressed, Hopeless 1 0 0  PHQ - 2 Score 2 0 0  Altered sleeping 2 0 0  Tired, decreased energy 3 0 0  Change in appetite 0 0 0  Feeling bad or failure about yourself  0 0 0  Trouble concentrating 2 0 0  Moving slowly or fidgety/restless 0 0 0  Suicidal thoughts 0 0 0  PHQ-9 Score 9 0 0  Difficult doing work/chores   Not difficult at all      11/11/2023    3:35 PM 07/20/2020    8:09 AM  GAD 7 : Generalized Anxiety Score  Nervous, Anxious, on Edge 1 2  Control/stop worrying 0 3  Worry too much - different things 1 3  Trouble relaxing 1 1  Restless 0 0  Easily annoyed or irritable 2 2  Afraid - awful might happen 0 1  Total GAD 7 Score 5 12  Anxiety Difficulty Somewhat difficult Somewhat difficult    Medications: Outpatient Medications Prior to Visit  Medication Sig  . Blood Glucose Monitoring Suppl DEVI 1 each by Does not apply route 2 (two) times daily as needed. May substitute to any manufacturer covered by patient's insurance.  . ezetimibe  (ZETIA ) 10 MG tablet Take 1 tablet (10 mg total) by mouth daily.  . FLUoxetine  (PROZAC ) 20 MG capsule Take 1 capsule (20 mg total) by mouth daily.  . FLUoxetine  (PROZAC ) 40 MG capsule Take 1 capsule (40 mg total) by mouth daily.  . gabapentin  (NEURONTIN ) 300 MG capsule Take 1 capsule (300 mg total) by mouth 2 (two) times daily.  . lisinopril  (ZESTRIL ) 10 MG tablet Take 1 tablet (10 mg total) by mouth daily.  .  methocarbamol  (ROBAXIN ) 750 MG tablet Take 1 tablet (750 mg total) by mouth every 8 (eight) hours as needed for muscle spasms.  . omeprazole  (PRILOSEC) 40 MG capsule Take 1 capsule by mouth once daily  . Pancrelipase , Lip-Prot-Amyl, (CREON ) 24000-76000 units CPEP TAKE 1 CAPSULE BY MOUTH WITH THE FIRST BITE OF EACH MEAL AND 1 CAPSULE WITH THE FIRST BITE OF EACH SNACK  . promethazine  (PHENERGAN ) 12.5 MG tablet Take 1 tablet (12.5 mg total) by mouth every 8 (eight) hours as needed for nausea or vomiting.  . sucralfate  (CARAFATE ) 1 g tablet Take 1 tablet (1 g total) by mouth 4 (four) times daily -  with meals and at bedtime for 15 days. (Patient not taking: Reported on 12/25/2023)  . traZODone  (DESYREL ) 100 MG tablet Take 2 tablets (200 mg total) by mouth at bedtime.  . valACYclovir  (VALTREX ) 500 MG tablet Take 2 tablets (1,000 mg total) by mouth 2 (two) times daily.   No facility-administered medications prior to visit.    Review of Systems  All other systems reviewed and are negative.  All negative Except see HPI   {Insert previous labs (optional):23779} {See past labs  Heme  Chem  Endocrine  Serology  Results Review (optional):1}   Objective    There were no vitals taken for this visit. {Insert last BP/Wt (optional):23777}{See vitals history (optional):1}   Physical Exam Vitals reviewed.  Constitutional:      General: She is not in acute distress.    Appearance: Normal appearance. She is well-developed. She is not diaphoretic.  HENT:     Head: Normocephalic and atraumatic.  Eyes:     General: No scleral icterus.    Conjunctiva/sclera: Conjunctivae normal.  Neck:     Thyroid: No thyromegaly.  Cardiovascular:     Rate and Rhythm: Normal rate and regular rhythm.     Pulses: Normal pulses.     Heart sounds: Normal heart sounds. No murmur heard. Pulmonary:     Effort: Pulmonary effort is normal. No respiratory distress.     Breath sounds: Normal breath sounds. No wheezing,  rhonchi or rales.  Musculoskeletal:     Cervical back: Neck supple.     Right lower leg: No edema.     Left lower leg: No edema.  Lymphadenopathy:     Cervical: No cervical adenopathy.  Skin:    General: Skin is warm and dry.     Findings: No rash.  Neurological:     Mental Status: She is alert and oriented to person, place, and time. Mental status is at baseline.  Psychiatric:        Mood and Affect: Mood normal.        Behavior: Behavior normal.     No results found for any visits on 01/13/24.      Assessment and Plan Assessment & Plan     No orders of the defined types were placed in this encounter.   No follow-ups on file.   The patient was advised to call back or seek an in-person evaluation if the symptoms worsen or if the condition fails to improve as anticipated.  I discussed the assessment and treatment plan with the patient. The patient was provided an opportunity to ask questions and all were answered. The patient agreed with the plan and demonstrated an understanding of the instructions.  I, Yader Criger, PA-C have reviewed all documentation for this visit. The documentation on 01/13/2024  for the exam, diagnosis, procedures, and orders are all accurate and complete.  Jolynn Spencer, [facility], MMS [facility] [phone] (phone) [phone] (fax)  [facility]

## 2024-01-13 ENCOUNTER — Ambulatory Visit (INDEPENDENT_AMBULATORY_CARE_PROVIDER_SITE_OTHER): Admitting: Physician Assistant

## 2024-01-13 DIAGNOSIS — F1020 Alcohol dependence, uncomplicated: Secondary | ICD-10-CM

## 2024-01-13 DIAGNOSIS — F339 Major depressive disorder, recurrent, unspecified: Secondary | ICD-10-CM

## 2024-01-13 DIAGNOSIS — S39012D Strain of muscle, fascia and tendon of lower back, subsequent encounter: Secondary | ICD-10-CM

## 2024-01-13 DIAGNOSIS — I10 Essential (primary) hypertension: Secondary | ICD-10-CM

## 2024-01-13 DIAGNOSIS — F17200 Nicotine dependence, unspecified, uncomplicated: Secondary | ICD-10-CM

## 2024-01-13 DIAGNOSIS — E7849 Other hyperlipidemia: Secondary | ICD-10-CM

## 2024-01-13 DIAGNOSIS — F419 Anxiety disorder, unspecified: Secondary | ICD-10-CM

## 2024-01-16 ENCOUNTER — Encounter: Payer: Self-pay | Admitting: Physician Assistant

## 2024-01-16 ENCOUNTER — Ambulatory Visit (INDEPENDENT_AMBULATORY_CARE_PROVIDER_SITE_OTHER): Admitting: Physician Assistant

## 2024-01-16 VITALS — BP 114/74 | HR 89 | Temp 99.1°F | Ht 66.0 in | Wt 192.0 lb

## 2024-01-16 DIAGNOSIS — J069 Acute upper respiratory infection, unspecified: Secondary | ICD-10-CM

## 2024-01-16 LAB — POCT RAPID STREP A (OFFICE): Rapid Strep A Screen: NEGATIVE

## 2024-01-16 LAB — POC COVID19/FLU A&B COMBO
Covid Antigen, POC: NEGATIVE
Influenza A Antigen, POC: NEGATIVE
Influenza B Antigen, POC: NEGATIVE

## 2024-01-16 MED ORDER — FLUCONAZOLE 150 MG PO TABS: 150.0000 mg | ORAL_TABLET | Freq: Once | ORAL | 0 refills | Status: AC

## 2024-01-16 MED ORDER — ALBUTEROL SULFATE HFA 108 (90 BASE) MCG/ACT IN AERS: 2.0000 | INHALATION_SPRAY | Freq: Four times a day (QID) | RESPIRATORY_TRACT | 0 refills | Status: AC | PRN

## 2024-01-16 MED ORDER — AMOXICILLIN-POT CLAVULANATE 875-125 MG PO TABS: 1.0000 | ORAL_TABLET | Freq: Two times a day (BID) | ORAL | 0 refills | Status: AC

## 2024-01-16 NOTE — Progress Notes (Signed)
 Established patient visit  Patient: [name]   DOB: 04/16/1976   48 y.o. Female  MRN: [id number] Visit Date: 01/16/2024  Today's healthcare provider: Jolynn Spencer, PA-C   Chief Complaint  Patient presents with   Acute Visit    Patient is present due to having a cold X Wednesday. She reports things are getting worse with symptoms of sore throat, chest congestion, ear pain, head congestion, loss of taste. She reports taking alka seltzer cold with no relief. She has not been around anyone sick that she is aware of.     Lab Work    Patient reports she had labs completed at [facility] and was advised to discuss with PCP. Labs completed 12/25/23   Subjective     HPI     Acute Visit    Additional comments: Patient is present due to having a cold X Wednesday. She reports things are getting worse with symptoms of sore throat, chest congestion, ear pain, head congestion, loss of taste. She reports taking alka seltzer cold with no relief. She has not been around anyone sick that she is aware of.          Lab Work    Additional comments: Patient reports she had labs completed at [facility] and was advised to discuss with PCP. Labs completed 12/25/23      Last edited by Lilian Fitzpatrick, CMA on 01/16/2024  2:03 PM.       Discussed the use of AI scribe software for clinical note transcription with the patient, who gave verbal consent to proceed.  History of Present Illness [name] is a 48 year old female who presents with severe headache and respiratory symptoms.  She experiences a severe headache without specific location or radiation. She has ear pain, particularly inside her ears. She experiences shortness of breath and wheezing. She has chest pain and congestion, both described as significant. She also has a sore throat. During the review of symptoms, she confirms shortness of breath, chest pain, and wheezing. No external ear problems are noted.       01/16/2024    2:04 PM  11/11/2023    3:35 PM 07/04/2023    1:56 PM  Depression screen PHQ 2/9  Decreased Interest 1 1 0  Down, Depressed, Hopeless 1 1 0  PHQ - 2 Score 2 2 0  Altered sleeping 0 2 0  Tired, decreased energy 1 3 0  Change in appetite 0 0 0  Feeling bad or failure about yourself  0 0 0  Trouble concentrating 0 2 0  Moving slowly or fidgety/restless 0 0 0  Suicidal thoughts 0 0 0  PHQ-9 Score 3 9 0  Difficult doing work/chores Somewhat difficult        01/16/2024    2:04 PM 11/11/2023    3:35 PM 07/20/2020    8:09 AM  GAD 7 : Generalized Anxiety Score  Nervous, Anxious, on Edge 1 1 2   Control/stop worrying 0 0 3  Worry too much - different things 0 1 3  Trouble relaxing 1 1 1   Restless 0 0 0  Easily annoyed or irritable 1 2 2   Afraid - awful might happen 0 0 1  Total GAD 7 Score 3 5 12   Anxiety Difficulty Somewhat difficult Somewhat difficult Somewhat difficult    Medications: Outpatient Medications Prior to Visit  Medication Sig   Blood Glucose Monitoring Suppl DEVI 1 each by Does not apply route 2 (two) times daily as needed. May  substitute to any manufacturer covered by patient's insurance.   ezetimibe  (ZETIA ) 10 MG tablet Take 1 tablet (10 mg total) by mouth daily.   FLUoxetine  (PROZAC ) 40 MG capsule Take 1 capsule (40 mg total) by mouth daily.   gabapentin  (NEURONTIN ) 300 MG capsule Take 1 capsule (300 mg total) by mouth 2 (two) times daily.   lisinopril  (ZESTRIL ) 10 MG tablet Take 1 tablet (10 mg total) by mouth daily.   methocarbamol  (ROBAXIN ) 750 MG tablet Take 1 tablet (750 mg total) by mouth every 8 (eight) hours as needed for muscle spasms.   omeprazole  (PRILOSEC) 40 MG capsule Take 1 capsule by mouth once daily   Pancrelipase , Lip-Prot-Amyl, (CREON ) 24000-76000 units CPEP TAKE 1 CAPSULE BY MOUTH WITH THE FIRST BITE OF EACH MEAL AND 1 CAPSULE WITH THE FIRST BITE OF EACH SNACK   promethazine  (PHENERGAN ) 12.5 MG tablet Take 1 tablet (12.5 mg total) by mouth every 8 (eight)  hours as needed for nausea or vomiting.   sucralfate  (CARAFATE ) 1 g tablet Take 1 tablet (1 g total) by mouth 4 (four) times daily -  with meals and at bedtime for 15 days.   traZODone  (DESYREL ) 100 MG tablet Take 2 tablets (200 mg total) by mouth at bedtime.   valACYclovir  (VALTREX ) 500 MG tablet Take 2 tablets (1,000 mg total) by mouth 2 (two) times daily.   FLUoxetine  (PROZAC ) 20 MG capsule Take 1 capsule (20 mg total) by mouth daily.   No facility-administered medications prior to visit.    Review of Systems  All other systems reviewed and are negative.  All negative Except see HPI       Objective    BP 114/74 Comment: home cuff  Pulse 89   Temp 99.1 F (37.3 C)   Ht 5' 6 (1.676 m)   Wt 192 lb (87.1 kg)   SpO2 100%   BMI 30.99 kg/m     Physical Exam Vitals reviewed.  Constitutional:      General: She is not in acute distress.    Appearance: Normal appearance. She is well-developed. She is not diaphoretic.  HENT:     Head: Normocephalic and atraumatic.  Eyes:     General: No scleral icterus.    Conjunctiva/sclera: Conjunctivae normal.  Neck:     Thyroid: No thyromegaly.  Cardiovascular:     Rate and Rhythm: Normal rate and regular rhythm.     Pulses: Normal pulses.     Heart sounds: Normal heart sounds. No murmur heard. Pulmonary:     Effort: Pulmonary effort is normal. No respiratory distress.     Breath sounds: Rhonchi and rales present. No wheezing.  Musculoskeletal:     Cervical back: Neck supple.     Right lower leg: No edema.     Left lower leg: No edema.  Lymphadenopathy:     Cervical: No cervical adenopathy.  Skin:    General: Skin is warm and dry.     Findings: No rash.  Neurological:     Mental Status: She is alert and oriented to person, place, and time. Mental status is at baseline.  Psychiatric:        Mood and Affect: Mood normal.        Behavior: Behavior normal.      No results found for any visits on 01/16/24.      Assessment  & Plan Upper respiratory infection Could be due to Acute bronchitis vs pneumonia with wheezing, sore throat, nasal congestion, headache, and shortness of  breath Abnormal lung sound all lung fields on PE - Prescribed antibiotics. - Continued albuterol  for wheezing and cough. - Recommended Mucinex  DM for congestion. - Advised Tylenol  and ibuprofen  for fever and headache. - Encouraged hydration and warm salt water  gargles for sore throat. - Recommended nasal saline rinse or spray, Flonase , and Zyrtec for nasal congestion. - Prescribed Diflucan . - Advised follow-up if symptoms persist after seven days. - Provided work note until next Friday. - Consider chest X-ray if symptoms worsen. Work note provided until 01/23/24 We will discuss pt's lab results and options at the follow-up   Orders Placed This Encounter  Procedures   POCT rapid strep A   POC Covid19/Flu A&B Antigen    No follow-ups on file.   The patient was advised to call back or seek an in-person evaluation if the symptoms worsen or if the condition fails to improve as anticipated.  I discussed the assessment and treatment plan with the patient. The patient was provided an opportunity to ask questions and all were answered. The patient agreed with the plan and demonstrated an understanding of the instructions.  I, Ineze Serrao, PA-C have reviewed all documentation for this visit. The documentation on 01/16/2024  for the exam, diagnosis, procedures, and orders are all accurate and complete.  Jolynn Spencer, [facility], MMS [facility] [phone] (phone) [phone] (fax)  [facility]

## 2024-01-21 ENCOUNTER — Other Ambulatory Visit: Payer: Self-pay | Admitting: Physician Assistant

## 2024-01-21 DIAGNOSIS — G4709 Other insomnia: Secondary | ICD-10-CM

## 2024-01-28 ENCOUNTER — Encounter: Payer: Self-pay | Admitting: Physician Assistant

## 2024-01-28 NOTE — Progress Notes (Unsigned)
 01/29/2024 [name] [id number] 1975/07/30  Referring provider: Ostwalt, Janna, PA-C Primary GI doctor: Dr. Charlanne  ASSESSMENT AND PLAN:  Worsening postprandial diarrhea via x 6 months, fecal incontinence, increasing gas/bloating in setting of possible EPI and on Creon  without help  Describes oily/watery stools, pudding at times, multiple times a day, has had nocturnal symptoms On doxy now, in May augmentin .  No sick contacts, no traveling/camping, no new medications 11/01/2022 colonoscopy 3 polyps 5 to 8 mm transverse colon, 12 mm polyp transverse colon status post MR clip normal TI normal mucosa nonbleeding internal hemorrhoids unremarkable biopsies pathology revealed tubular adenoma, recall 3 years Previously low pancreatic elastase but think this may have been false positive, Creon  not helping CTAP unremarkable pancreas.   Most likely IBS-D or mixed however we will try to rule out infection, IBD, EPI, microscopic colitis -KUB to evaluate for stool burden/obstruction with previous history of constipation - stool samples to rule out infection with recent ABX, Diatherix given to the patient, giardia testing  -CRP/ESR ,fecal calprotectin with halo sign on previous CT with previous colonoscopy had no evidence of IBD - TSH  -Pancreatic elastase/fecal fat, Consider MRI or MRCP if fecal fat is present. -Add on citracel/benefiber, FODMAP, trial off lactulose  and lifestyle changes discussed -Discussed with the patient we will plan on colonoscopy and EGD for change in bowel habits rule out microscopic colitis -Will schedule for endoscopic evaluation, discussed with patient and agrees with plan. -Consider SIBO testing or xifaxin trial pending results - Consider pelvic floor referral  Epigastric AB pain radiating into her chest with history of GERD with history of H. Pylori, no dysphagia, patchy intestinal metaplasia on last EGD 2 years ago No dysphagia Can have khaki color stools or dark  tarry stools, no pepto, no iron supplement 06/25/2022 EGD normal duodenal bulb, H. pylori gastritis with patchy intestinal metaplasia present, normal esophagus and GE junction, negative celiac negative dysplasia H. pylori eradication breath test confirmed 11/14/2023 CTAP W no acute inflammatory process, showe fat halo sign in TI,  On omeprazole  40 mg once a day, occ takes tums On prednisone  now for bronchitis, no NSAIDS, rare alcohol, tobacco use - Continue pantoprazole  40 mg daily, take 30 minutes to 1 hour before meals. - Add Pepcid  40 mg at night. - Refill omeprazole  prescription. - diatherix H pylori - schedule EGD with colonoscopy, I discussed risks of EGD with patient today, including risk of sedation, bleeding or perforation. Patient provides understanding and gave verbal consent to proceed.  MAFLD    Latest Ref Rng & Units 11/14/2023   10:15 AM 11/12/2023    2:44 PM 02/12/2023    1:25 PM  Hepatic Function  Total Protein 6.5 - 8.1 g/dL 7.5  7.1  7.8   Albumin 3.5 - 5.0 g/dL 4.0  4.8  4.2   AST 15 - 41 U/L 31  33  34   ALT 0 - 44 U/L 24  28  32   Alk Phosphatase 38 - 126 U/L 61  96  70   Total Bilirubin 0.0 - 1.2 mg/dL 0.7  0.3  0.4   Bilirubin, Direct 0.0 - 0.2 mg/dL   0.2    Platelets 738  INR 07/04/2022 1.0  Rare ETOH several times a months Seen 11/14/2023 on CTAP W for abdominal pain hepatic steatosis no suspicious masses noncirrhotic configuration no intrahepatic bile duct dilation there is mild prominence of the extrahepatic bile duct most likely due to postcholecystectomy status.   Pancreas is  unremarkable no ductal dilation no inflammatory changes normal spleen - Suspect fatty liver, no LFT elevation - Recheck liver function, if elevated LFTs suggest hepatocellular workup. - CBC and LFT monitoring every 6 months - Consider elastography - Avoid alcohol - Weight loss discussed with the patient  Patient Care Team: Ostwalt, Janna, PA-C as PCP - General (Physician  Assistant)  HISTORY OF PRESENT ILLNESS: 48 y.o. female with a past medical history listed below presents for evaluation of diarrhea, GERD.   Patient presents as a new patient of Dr. Charlanne, previously saw [facility] gastroenterology Dr. Unk, last seen 01/22/2023 for elevated LFTs and abdominal pain.  Discussed the use of AI scribe software for clinical note transcription with the patient, who gave verbal consent to proceed.  History of Present Illness   [name] is a 48 year old female who presents with worsening diarrhea and fecal incontinence.  Over the past six months, she has experienced worsening diarrhea and fecal incontinence, necessitating proximity to a bathroom after meals. She describes her bowel movements as watery with oily chunks, sometimes resembling pudding, and rarely solid. She experiences nocturnal symptoms, occasionally waking up to find she has soiled her bed. Stool color varies from pale brown or khaki to occasionally tarry black. She does not use Pepto Bismol or iron supplements but takes Tums for indigestion.  She is currently taking Creon  with every meal, starting with her second bite, at the lowest dose due to severe constipation with higher doses. Despite this, she continues to experience postprandial diarrhea and bloating.  She has a history of abdominal pain, particularly in the upper abdomen and chest, distinct from her bloating and discomfort. A CT scan in June for abdominal pain and a previous endoscopy in January 2024 showed H. pylori gastritis, though subsequent breath tests were negative. A colonoscopy in May 2024 revealed three polyps and internal hemorrhoids.  She has a history of fatty liver and elevated liver function tests, with the last test in June being normal. Her gallbladder was previously removed. She consumes alcohol socially a couple of times a month and smokes. There is no family history of liver issues.  She experiences swelling in her  legs, particularly her ankles and calves, but denies rashes or joint pain. No significant nausea or vomiting. She is currently on doxycycline  and was on Augmentin  in May. She has not started any new supplements or medications in the last six months.        She  reports that she has been smoking cigarettes. She started smoking about 3 months ago. She has a 22.6 pack-year smoking history. She has never used smokeless tobacco. She reports current alcohol use of about 4.0 standard drinks of alcohol per week. She reports that she does not use drugs.  RELEVANT GI HISTORY, IMAGING AND LABS: Results   LABS Breath test: Negative  RADIOLOGY CT abdomen: No evidence of pancreatic insufficiency, fat halo sign (11/2023)  DIAGNOSTIC Endoscopy: H. pylori gastritis (06/2022) Colonoscopy: Three polyps removed, one 12 mm, internal hemorrhoids (11/01/2022)     GI Procedures:  Upper endoscopy 06/25/2022 Normal duodenal bulb, second portion of duodenum and third portion of duodenum, biopsied Antral erythema, biopsied Normal esophagus and GE junction DIAGNOSIS:  A. DUODENUM; COLD BIOPSY:  - MILD FOCAL PEPTIC DUODENITIS.  - PRESERVED VILLOUS ARCHITECTURE.  - NEGATIVE FOR FEATURES OF CELIAC, DYSPLASIA, AND MALIGNANCY.   B. STOMACH; COLD BIOPSY:  - MILD TO MODERATE CHRONIC ACTIVE GASTRITIS.  - IMMUNOCHEMICAL STAINS FOR HELICOBACTER PYLORI SHOW FOCAL  ORGANISMS OF  H. PYLORI (SEE COMMENT)  - PATCHY INTESTINAL METAPLASIA IS PRESENT.  - NEGATIVE FOR DYSPLASIA AND MALIGNANCY.      Colonoscopy 11/01/2022 - Three 5 to 8 mm polyps in the transverse colon, removed with a cold snare. Resected and retrieved. - One 12 mm polyp in the transverse colon, removed with a hot snare. Resected and retrieved. Clip manufacturer: [employer]. Clip ( MR safe) was placed. - The examined portion of the ileum was normal. Biopsied. - Normal mucosa in the entire examined colon. Biopsied. - The distal rectum and anal verge are  normal on retroflexion view. - Non- bleeding external hemorrhoids. Pathology revealed tubular adenoma Terminal ileal biopsies were normal Random colon biopsies were normal CBC    Component Value Date/Time   WBC 5.6 11/14/2023 0931   RBC 4.16 11/14/2023 0931   HGB 13.0 11/14/2023 0931   HGB 14.0 11/12/2023 1444   HCT 39.2 11/14/2023 0931   HCT 41.9 11/12/2023 1444   PLT 261 11/14/2023 0931   PLT 349 11/12/2023 1444   MCV 94.2 11/14/2023 0931   MCV 95 11/12/2023 1444   MCV 91 05/14/2014 2239   MCH 31.3 11/14/2023 0931   MCHC 33.2 11/14/2023 0931   RDW 11.9 11/14/2023 0931   RDW 12.2 11/12/2023 1444   RDW 12.7 05/14/2014 2239   LYMPHSABS 2.8 11/12/2023 1444   LYMPHSABS 1.4 10/23/2013 1022   MONOABS 0.4 12/20/2022 1148   MONOABS 0.7 10/23/2013 1022   EOSABS 0.1 11/12/2023 1444   EOSABS 0.1 10/23/2013 1022   BASOSABS 0.0 11/12/2023 1444   BASOSABS 0.1 10/23/2013 1022   Recent Labs    02/12/23 1325 11/12/23 1444 11/14/23 0931  HGB 14.5 14.0 13.0    CMP     Component Value Date/Time   NA 138 11/14/2023 1015   NA 140 11/12/2023 1444   NA 139 05/14/2014 2239   K 3.9 11/14/2023 1015   K 3.5 05/14/2014 2239   CL 105 11/14/2023 1015   CL 108 (H) 05/14/2014 2239   CO2 20 (L) 11/14/2023 1015   CO2 22 05/14/2014 2239   GLUCOSE 103 (H) 11/14/2023 1015   GLUCOSE 129 (H) 05/14/2014 2239   BUN 10 11/14/2023 1015   BUN 10 11/12/2023 1444   BUN 6 (L) 05/14/2014 2239   CREATININE 0.80 11/14/2023 1015   CREATININE 1.10 05/14/2014 2239   CALCIUM  9.0 11/14/2023 1015   CALCIUM  8.4 (L) 05/14/2014 2239   PROT 7.5 11/14/2023 1015   PROT 7.1 11/12/2023 1444   ALBUMIN 4.0 11/14/2023 1015   ALBUMIN 4.8 11/12/2023 1444   AST 31 11/14/2023 1015   ALT 24 11/14/2023 1015   ALKPHOS 61 11/14/2023 1015   BILITOT 0.7 11/14/2023 1015   BILITOT 0.3 11/12/2023 1444   GFRNONAA >60 11/14/2023 1015   GFRNONAA 60 (L) 05/14/2014 2239   GFRNONAA >60 10/23/2013 1022   GFRAA 89 03/29/2020 1518    GFRAA >60 05/14/2014 2239   GFRAA >60 10/23/2013 1022      Latest Ref Rng & Units 11/14/2023   10:15 AM 11/12/2023    2:44 PM 02/12/2023    1:25 PM  Hepatic Function  Total Protein 6.5 - 8.1 g/dL 7.5  7.1  7.8   Albumin 3.5 - 5.0 g/dL 4.0  4.8  4.2   AST 15 - 41 U/L 31  33  34   ALT 0 - 44 U/L 24  28  32   Alk Phosphatase 38 - 126 U/L 61  96  70   Total Bilirubin 0.0 - 1.2 mg/dL 0.7  0.3  0.4   Bilirubin, Direct 0.0 - 0.2 mg/dL   0.2       Latest Ref Rng & Units 06/07/2022    1:21 AM  Hepatitis C  HCV Quanitative >50 IU/mL HCV Not Detected     Current Medications:   Current Outpatient Medications (Endocrine & Metabolic):    predniSONE  (DELTASONE ) 20 MG tablet, Take 20 mg by mouth. Tapering dose  Current Outpatient Medications (Cardiovascular):    ezetimibe  (ZETIA ) 10 MG tablet, Take 1 tablet (10 mg total) by mouth daily.   lisinopril  (ZESTRIL ) 10 MG tablet, Take 1 tablet (10 mg total) by mouth daily.  Current Outpatient Medications (Respiratory):    albuterol  (VENTOLIN  HFA) 108 (90 Base) MCG/ACT inhaler, Inhale 2 puffs into the lungs every 6 (six) hours as needed for wheezing or shortness of breath.   promethazine  (PHENERGAN ) 12.5 MG tablet, Take 1 tablet (12.5 mg total) by mouth every 8 (eight) hours as needed for nausea or vomiting.    Current Outpatient Medications (Other):    Blood Glucose Monitoring Suppl DEVI, 1 each by Does not apply route 2 (two) times daily as needed. May substitute to any manufacturer covered by patient's insurance.   doxycycline  (VIBRAMYCIN ) 100 MG capsule, Take 100 mg by mouth 2 (two) times daily.   famotidine  (PEPCID ) 40 MG tablet, Take 1 tablet (40 mg total) by mouth at bedtime.   fluconazole  (DIFLUCAN ) 150 MG tablet, Take 150 mg by mouth once.   FLUoxetine  (PROZAC ) 40 MG capsule, Take 1 capsule (40 mg total) by mouth daily.   gabapentin  (NEURONTIN ) 300 MG capsule, Take 1 capsule (300 mg total) by mouth 2 (two) times daily.   methocarbamol   (ROBAXIN ) 750 MG tablet, Take 1 tablet (750 mg total) by mouth every 8 (eight) hours as needed for muscle spasms.   Na Sulfate-K Sulfate-Mg Sulfate concentrate (SUPREP) 17.5-3.13-1.6 GM/177ML SOLN, Take 1 kit (354 mLs total) by mouth once for 1 dose.   Pancrelipase , Lip-Prot-Amyl, (CREON ) 24000-76000 units CPEP, TAKE 1 CAPSULE BY MOUTH WITH THE FIRST BITE OF EACH MEAL AND 1 CAPSULE WITH THE FIRST BITE OF EACH SNACK   traZODone  (DESYREL ) 100 MG tablet, Take 2 tablets (200 mg total) by mouth at bedtime.   valACYclovir  (VALTREX ) 500 MG tablet, Take 2 tablets (1,000 mg total) by mouth 2 (two) times daily.   omeprazole  (PRILOSEC) 40 MG capsule, Take 1 capsule (40 mg total) by mouth daily.   sucralfate  (CARAFATE ) 1 g tablet, Take 1 tablet (1 g total) by mouth 4 (four) times daily -  with meals and at bedtime for 15 days. (Patient not taking: Reported on 01/29/2024)  Medical History:  Past Medical History:  Diagnosis Date   Allergy     Anxiety    Arthritis    BRCA negative 07/2018   MyRisk neg   Bronchitis    Colon polyps    Depression    Duodenal ulcer    Family history of breast cancer    IBIS=13%/riskscore=8.4%   Family history of ovarian cancer    Family history of pancreatic cancer    Gallstones    GERD (gastroesophageal reflux disease)    H. pylori infection    History of frequent urinary tract infections    HLD (hyperlipidemia)    HTN (hypertension)    Hypoglycemia    IBS (irritable bowel syndrome)    Migraine headache    linger for days when they happen  Vertigo    none for several years   Allergies:  Allergies  Allergen Reactions   Statins Other (See Comments)    myopathy   Codeine  Nausea And Vomiting   Sulfa Antibiotics Rash   Vicodin [Hydrocodone -Acetaminophen ] Nausea Only    And itching all over     Surgical History:  She  has a past surgical history that includes Toe Surgery (Bilateral); Wisdom tooth extraction; Diagnostic laparoscopy; Tubal ligation;  Cholecystectomy; Maximum access (mas)posterior lumbar interbody fusion (plif) 1 level (N/A, 05/06/2013); laparoscopic appendectomy (N/A, 12/08/2016); Abdominal hysterectomy (07/2006); Esophagogastroduodenoscopy (egd) with propofol  (N/A, 06/25/2022); Colonoscopy with propofol  (N/A, 11/01/2022); and bone spurs removed (Right). Family History:  Her family history includes Alcohol abuse in her father, sister, and sister; Anxiety disorder in her mother, sister, and sister; Arthritis in her father and mother; Breast cancer (age of onset: 34) in her maternal aunt; COPD in her mother; Cancer in her maternal grandmother; Colon cancer (age of onset: 46) in her paternal uncle; Colon cancer (age of onset: 46) in her maternal grandfather; Depression in her father, mother, sister, and sister; Drug abuse in her mother, sister, and sister; Epilepsy in her mother; Hypertension in her father and mother; Obesity in her mother; Ovarian cancer (age of onset: 48) in her maternal aunt; Pancreatic cancer (age of onset: 76) in her maternal grandmother; Sleep apnea in her father and mother; Stroke in her mother.  REVIEW OF SYSTEMS  : All other systems reviewed and negative except where noted in the History of Present Illness.  PHYSICAL EXAM: BP 124/80 (BP Location: Left Arm, Patient Position: Sitting, Cuff Size: Normal)   Pulse 68   Ht 5' 5.5 (1.664 m) Comment: height measured without shoes  Wt 196 lb 4 oz (89 kg)   BMI 32.16 kg/m  Physical Exam   GENERAL APPEARANCE: Well nourished, in no apparent distress. HEENT: No cervical lymphadenopathy, unremarkable thyroid, sclerae anicteric, conjunctiva pink. RESPIRATORY: Respiratory effort normal, breath sounds equal bilaterally without rales, rhonchi, or wheezing. Lungs clear to auscultation. CARDIO: Regular rate and rhythm with no murmurs, rubs, or gallops, peripheral pulses intact. ABDOMEN: Soft, non-distended, active bowel sounds in all four quadrants, mild discomfort in right  lower abdomen, no tenderness to palpation, no rebound, no mass appreciated. RECTAL: Declines. MUSCULOSKELETAL: Full range of motion, normal gait, without edema. SKIN: Dry, intact without rashes or lesions. No jaundice. NEURO: Alert, oriented, no focal deficits. PSYCH: Cooperative, normal mood and affect.      Alan JONELLE Coombs, PA-C 11:21 AM

## 2024-01-29 ENCOUNTER — Ambulatory Visit: Admitting: Physician Assistant

## 2024-01-29 ENCOUNTER — Ambulatory Visit (INDEPENDENT_AMBULATORY_CARE_PROVIDER_SITE_OTHER)
Admission: RE | Admit: 2024-01-29 | Discharge: 2024-01-29 | Disposition: A | Discharge status: Home / Self Care | Source: Ambulatory Visit | Attending: Physician Assistant | Admitting: Physician Assistant

## 2024-01-29 ENCOUNTER — Other Ambulatory Visit (INDEPENDENT_AMBULATORY_CARE_PROVIDER_SITE_OTHER)

## 2024-01-29 ENCOUNTER — Encounter: Payer: Self-pay | Admitting: Physician Assistant

## 2024-01-29 ENCOUNTER — Ambulatory Visit: Payer: Self-pay | Admitting: Physician Assistant

## 2024-01-29 VITALS — BP 124/80 | HR 68 | Ht 65.5 in | Wt 196.2 lb

## 2024-01-29 DIAGNOSIS — R197 Diarrhea, unspecified: Secondary | ICD-10-CM | POA: Diagnosis not present

## 2024-01-29 DIAGNOSIS — K7581 Nonalcoholic steatohepatitis (NASH): Secondary | ICD-10-CM

## 2024-01-29 DIAGNOSIS — A048 Other specified bacterial intestinal infections: Secondary | ICD-10-CM

## 2024-01-29 DIAGNOSIS — R1013 Epigastric pain: Secondary | ICD-10-CM

## 2024-01-29 DIAGNOSIS — D123 Benign neoplasm of transverse colon: Secondary | ICD-10-CM

## 2024-01-29 DIAGNOSIS — K8689 Other specified diseases of pancreas: Secondary | ICD-10-CM

## 2024-01-29 DIAGNOSIS — R7989 Other specified abnormal findings of blood chemistry: Secondary | ICD-10-CM

## 2024-01-29 DIAGNOSIS — D72829 Elevated white blood cell count, unspecified: Secondary | ICD-10-CM

## 2024-01-29 LAB — COMPREHENSIVE METABOLIC PANEL WITH GFR
ALT: 54 U/L — ABNORMAL HIGH (ref 0–35)
AST: 33 U/L (ref 0–37)
Albumin: 4.4 g/dL (ref 3.5–5.2)
Alkaline Phosphatase: 75 U/L (ref 39–117)
BUN: 14 mg/dL (ref 6–23)
CO2: 27 meq/L (ref 19–32)
Calcium: 9.3 mg/dL (ref 8.4–10.5)
Chloride: 101 meq/L (ref 96–112)
Creatinine, Ser: 0.89 mg/dL (ref 0.40–1.20)
GFR: 77.84 mL/min (ref >60.00)
Glucose, Bld: 77 mg/dL (ref 70–99)
Potassium: 4.4 meq/L (ref 3.5–5.1)
Sodium: 138 meq/L (ref 135–145)
Total Bilirubin: 0.3 mg/dL (ref 0.2–1.2)
Total Protein: 7.3 g/dL (ref 6.0–8.3)

## 2024-01-29 LAB — CBC WITH DIFFERENTIAL/PLATELET
Basophils Absolute: 0 K/uL (ref 0.0–0.1)
Basophils Relative: 0.3 % (ref 0.0–3.0)
Eosinophils Absolute: 0.1 K/uL (ref 0.0–0.7)
Eosinophils Relative: 0.3 % (ref 0.0–5.0)
HCT: 42.9 % (ref 36.0–46.0)
Hemoglobin: 14.2 g/dL (ref 12.0–15.0)
Lymphocytes Relative: 13.7 % (ref 12.0–46.0)
Lymphs Abs: 2.5 K/uL (ref 0.7–4.0)
MCHC: 33 g/dL (ref 30.0–36.0)
MCV: 93.1 fl (ref 78.0–100.0)
Monocytes Absolute: 0.7 K/uL (ref 0.1–1.0)
Monocytes Relative: 3.9 % (ref 3.0–12.0)
Neutro Abs: 14.8 K/uL — ABNORMAL HIGH (ref 1.4–7.7)
Neutrophils Relative %: 81.8 % — ABNORMAL HIGH (ref 43.0–77.0)
Platelets: 271 K/uL (ref 150.0–400.0)
RBC: 4.61 Mil/uL (ref 3.87–5.11)
RDW: 12.8 % (ref 11.5–15.5)
WBC: 18.1 K/uL (ref 4.0–10.5)

## 2024-01-29 LAB — SEDIMENTATION RATE: Sed Rate: 6 mm/h (ref 0–20)

## 2024-01-29 LAB — TSH: TSH: 1.07 u[IU]/mL (ref 0.35–5.50)

## 2024-01-29 LAB — C-REACTIVE PROTEIN: CRP: <1 mg/dL (ref 0.5–20.0)

## 2024-01-29 MED ORDER — OMEPRAZOLE 40 MG PO CPDR: 40.0000 mg | DELAYED_RELEASE_CAPSULE | Freq: Every day | ORAL | 1 refills | Status: AC

## 2024-01-29 MED ORDER — FAMOTIDINE 40 MG PO TABS: 40.0000 mg | ORAL_TABLET | Freq: Every day | ORAL | 3 refills | Status: AC

## 2024-01-29 MED ORDER — NA SULFATE-K SULFATE-MG SULF 17.5-3.13-1.6 GM/177ML PO SOLN: 1.0000 | Freq: Once | ORAL | 0 refills | Status: AC

## 2024-01-29 NOTE — Patient Instructions (Addendum)
 Your provider has requested that you go to the basement level for lab work before leaving today. Press B on the elevator. The lab is located at the first door on the left as you exit the elevator.  Due to recent changes in healthcare laws, you may see the results of your imaging and laboratory studies on MyChart before your provider has had a chance to review them.  We understand that in some cases there may be results that are confusing or concerning to you. Not all laboratory results come back in the same time frame and the provider may be waiting for multiple results in order to interpret others.  Please give us  48 hours in order for your provider to thoroughly review all the results before contacting the office for clarification of your results.    Your provider has requested that you have an abdominal x ray before leaving today. Please go to the basement floor to our Radiology department for the test.  We are ordering a [employer] stool testing for you to take home and complete.  You have received a kit from our office today containing all necessary supplies to complete this test.  Please carefully read the stool collection instructions provided in the kit before opening the accompanying materials  OR an easier way is to please use toilet paper to wipe after your bowel movement and use the qtip/applicator provided to get a small volume of the stool from the toilet paper and place that in the tube.   You have been scheduled for an endoscopy and colonoscopy. Please follow the written instructions given to you at your visit today.  If you use inhalers (even only as needed), please bring them with you on the day of your procedure.  DO NOT TAKE 7 DAYS PRIOR TO TEST- Trulicity (dulaglutide) Ozempic, Wegovy (semaglutide) Mounjaro (tirzepatide) Bydureon Bcise (exanatide extended release)  DO NOT TAKE 1 DAY PRIOR TO YOUR TEST Rybelsus (semaglutide) Adlyxin (lixisenatide) Victoza  (liraglutide) Byetta (exanatide) ___________________________________________________________________________     Important to remember: -Place the label onto the puritan opti-swab TUBE. -This label should include your full name and date of birth.  - This label should have the DATE AND TIME stool was collects -After completing the test, you should secure the tube into the specimen biohazard bag.  -The laboratory request information sheet (including date and time of specimen collection) should be placed into the outside pocket of the specimen biohazard bag and returned to the [facility] lab with 2 days of collection.   If it is greater than two days from collection you will be asked to repeat the test.  If the label is missing from the tube with your name, date of birth, date and time of collection on it, you will have to repeat the test.  Any questions please message us  on my chart or call the office at [phone]   Please take your proton pump inhibitor medication, pantoprazole  40 mg once day  Please take this medication 30 minutes to 1 hour before meals- this makes it more effective.  Add on pepcid  40 mg at night Avoid spicy and acidic foods Avoid fatty foods Limit your intake of coffee, tea, alcohol, and carbonated drinks Work to maintain a healthy weight Keep the head of the bed elevated at least 3 inches with blocks or a wedge pillow if you are having any nighttime symptoms Stay upright for 2 hours after eating Avoid meals and snacks three to four hours before bedtime Stop  smoking   FIBER SUPPLEMENT You can do metamucil or fibercon once or twice a day but if this causes gas/bloating please switch to Benefiber or Citracel.  Fiber is good for constipation/diarrhea/irritable bowel syndrome.  It can also help with weight loss and can help lower your bad cholesterol (LDL).  Please do 1 TBSP in the morning in water , coffee, or tea.  It can take up to a month before you can see a  difference with your bowel movements.  It is cheapest from [employer], [employer], [employer].   First do a trial off milk/lactose products if you use them.  Add fiber like benefiber or citracel once a day Increase activity   Thank you for trusting me with your gastrointestinal care!   Alan Coombs, PA-C   _______________________________________________________  If your blood pressure at your visit was 140/90 or greater, please contact your primary care physician to follow up on this.  _______________________________________________________  If you are age 34 or older, your body mass index should be between 23-30. Your Body mass index is 32.16 kg/m. If this is out of the aforementioned range listed, please consider follow up with your Primary Care Provider.  If you are age 40 or [place], your body mass index should be between 19-25. Your Body mass index is 32.16 kg/m. If this is out of the aformentioned range listed, please consider follow up with your Primary Care Provider.   ________________________________________________________  The [facility] providers would like to encourage you to use MYCHART to communicate with providers for non-urgent requests or questions.  Due to long hold times on the telephone, sending your provider a message by [facility] may be a faster and more efficient way to get a response.  Please allow 48 business hours for a response.  Please remember that this is for non-urgent requests.  _______________________________________________________  [facility] is using a team-based approach to care.  Your team is made up of your doctor and two to three APPS. Our APPS (Nurse Practitioners and Physician Assistants) work with your physician to ensure care continuity for you. They are fully qualified to address your health concerns and develop a treatment plan. They communicate directly with your gastroenterologist to care for you. Seeing the Advanced Practice  Practitioners on your physician's team can help you by facilitating care more promptly, often allowing for earlier appointments, access to diagnostic testing, procedures, and other specialty referrals.       FODMAP stands for fermentable oligo-, di-, mono-saccharides and polyols (1). These are the scientific terms used to classify groups of carbs that are difficult for our body to digest and that are notorious for triggering digestive symptoms like bloating, gas, loose stools and stomach pain.   You can try low FODMAP diet  - start with eliminating just one column at a time that you feel may be a trigger for you. - the table at the very bottom contains foods that are low in FODMAPs   Sometimes trying to eliminate the FODMAP's from your diet is difficult or tricky, if you are stuggling with trying to do the elimination diet you can try an enzyme.  There is a food enzymes that you sprinkle in or on your food that helps break down the FODMAP. You can read more about the enzyme by going to this site: https://fodzyme.com/  Small intestinal bacterial overgrowth (SIBO) occurs when there is an abnormal increase in the overall bacterial population in the small intestine -- particularly types of bacteria not commonly found in  that part of the digestive tract. Small intestinal bacterial overgrowth (SIBO) commonly results when a circumstance -- such as surgery or disease -- slows the passage of food and waste products in the digestive tract, creating a breeding ground for bacteria.  Signs and symptoms of SIBO often include: Loss of appetite Abdominal pain Nausea Bloating An uncomfortable feeling of fullness after eating Diarrhea or constipation, depending on the type of gas produced  What foods trigger SIBO? While foods aren't the original cause of SIBO, certain foods do encourage the overgrowth of the wrong bacteria in your small intestine. If you're feeding them their favorite foods, they're  going to grow more, and that will trigger more of your SIBO symptoms. By the same token, you can help reduce the overgrowth by starving the problematic bacteria of their favorite foods. This strategy has led to a number of proposed SIBO eating plans. The plans vary, and so do individual results. But in general, they tend to recommend limiting carbohydrates.  These include: Sugars and sweeteners. Fruits and starchy vegetables. Dairy products. Grains.  There is a test for this we can do called a breath test, if you are positive we will treat you with an antibiotic to see if it helps.  Your symptoms are very suspicious for this condition, as discussed, we will start you on an antibiotic to see if this helps.

## 2024-01-30 ENCOUNTER — Ambulatory Visit: Admitting: Physician Assistant

## 2024-01-30 ENCOUNTER — Telehealth (INDEPENDENT_AMBULATORY_CARE_PROVIDER_SITE_OTHER): Admitting: Physician Assistant

## 2024-01-30 DIAGNOSIS — N760 Acute vaginitis: Secondary | ICD-10-CM | POA: Diagnosis not present

## 2024-01-30 DIAGNOSIS — B9689 Other specified bacterial agents as the cause of diseases classified elsewhere: Secondary | ICD-10-CM

## 2024-01-30 DIAGNOSIS — E282 Polycystic ovarian syndrome: Secondary | ICD-10-CM | POA: Diagnosis not present

## 2024-01-30 DIAGNOSIS — N951 Menopausal and female climacteric states: Secondary | ICD-10-CM

## 2024-01-30 DIAGNOSIS — K8689 Other specified diseases of pancreas: Secondary | ICD-10-CM | POA: Diagnosis not present

## 2024-01-30 MED ORDER — METRONIDAZOLE 500 MG PO TABS
500.0000 mg | ORAL_TABLET | Freq: Two times a day (BID) | ORAL | 0 refills | Status: AC
Start: 2024-01-30 — End: 2024-02-06

## 2024-01-30 NOTE — Progress Notes (Signed)
 MyChart Video Visit  Virtual Visit via Video Note   This format is felt to be most appropriate for this patient at this time. Physical exam was limited by quality of the video and audio technology used for the visit.   Provider location: Virtual Visit Location Provider: Office/Clinic Patient Location: Home  I discussed the limitations of evaluation and management by telemedicine and the availability of in person appointments. The patient expressed understanding and agreed to proceed.  Patient: [name]   DOB: 1975-07-18   48 y.o. Female  MRN: [id number] Visit Date: 01/30/2024  Today's healthcare provider: Jolynn Spencer, PA-C   No chief complaint on file.  Subjective     Discussed the use of AI scribe software for clinical note transcription with the patient, who gave verbal consent to proceed.  History of Present Illness [name] is a 48 year old female who presents to discuss lab results related to polycystic ovarian syndrome (PCOS) and gastrointestinal issues. She is accompanied by her fianc.  She recently underwent hormonal testing in July, and her OBGYN advised her to discuss the results with her primary care provider. She seeks clarification and management options for PCOS, as detailed communication about the results was not provided.  She experiences gastrointestinal issues and was previously diagnosed with pancreatic insufficiency. She is on enzyme replacement therapy, but her gastroenterologist suspects a possible misdiagnosis. A repeat fecal test has been requested to confirm the condition.  She has a recent shift in her vaginal flora, with lab results indicating bacterial vaginosis. She has not yet received treatment and seeks guidance on appropriate management.     Medications: Outpatient Medications Prior to Visit  Medication Sig   albuterol  (VENTOLIN  HFA) 108 (90 Base) MCG/ACT inhaler Inhale 2 puffs into the lungs every 6 (six) hours as needed  for wheezing or shortness of breath.   Blood Glucose Monitoring Suppl DEVI 1 each by Does not apply route 2 (two) times daily as needed. May substitute to any manufacturer covered by patient's insurance.   doxycycline  (VIBRAMYCIN ) 100 MG capsule Take 100 mg by mouth 2 (two) times daily.   ezetimibe  (ZETIA ) 10 MG tablet Take 1 tablet (10 mg total) by mouth daily.   famotidine  (PEPCID ) 40 MG tablet Take 1 tablet (40 mg total) by mouth at bedtime.   fluconazole  (DIFLUCAN ) 150 MG tablet Take 150 mg by mouth once.   FLUoxetine  (PROZAC ) 40 MG capsule Take 1 capsule (40 mg total) by mouth daily.   gabapentin  (NEURONTIN ) 300 MG capsule Take 1 capsule (300 mg total) by mouth 2 (two) times daily.   lisinopril  (ZESTRIL ) 10 MG tablet Take 1 tablet (10 mg total) by mouth daily.   methocarbamol  (ROBAXIN ) 750 MG tablet Take 1 tablet (750 mg total) by mouth every 8 (eight) hours as needed for muscle spasms.   omeprazole  (PRILOSEC) 40 MG capsule Take 1 capsule (40 mg total) by mouth daily.   Pancrelipase , Lip-Prot-Amyl, (CREON ) 24000-76000 units CPEP TAKE 1 CAPSULE BY MOUTH WITH THE FIRST BITE OF EACH MEAL AND 1 CAPSULE WITH THE FIRST BITE OF EACH SNACK   predniSONE  (DELTASONE ) 20 MG tablet Take 20 mg by mouth. Tapering dose   promethazine  (PHENERGAN ) 12.5 MG tablet Take 1 tablet (12.5 mg total) by mouth every 8 (eight) hours as needed for nausea or vomiting.   sucralfate  (CARAFATE ) 1 g tablet Take 1 tablet (1 g total) by mouth 4 (four) times daily -  with meals and at bedtime for 15 days. (Patient not  taking: Reported on 01/29/2024)   traZODone  (DESYREL ) 100 MG tablet Take 2 tablets (200 mg total) by mouth at bedtime.   valACYclovir  (VALTREX ) 500 MG tablet Take 2 tablets (1,000 mg total) by mouth 2 (two) times daily.   No facility-administered medications prior to visit.    Review of Systems All negative  Except see HPI       Objective    There were no vitals taken for this visit.       Physical  Exam  Constitutional:      General: She is not in acute distress.    Appearance: Normal appearance.  HENT:     Head: Normocephalic.  Pulmonary:     Effort: Pulmonary effort is normal. No respiratory distress.  Neurological:     Mental Status: She is alert and oriented to person, place, and time. Mental status is at baseline.       Assessment & Plan Perimenopause/Polycystic ovarian syndrome (PCOS) PCOS with hormonal changes/elevated testosterone  levels. Weight loss and lifestyle modifications are crucial. Metformin deferred due to gastrointestinal concerns. Alternative treatments considered after further assessment. - Communicate with OBGYN for further management of PCOS. - Encourage weight loss and lifestyle modifications, including healthy diet and exercise. - Reassess blood pressure and electrolytes at next visit to consider alternative treatments. - Defer metformin until gastrointestinal issues are resolved and pancreatic insufficiency is confirmed or ruled out.  Bacterial vaginosis Shift in flora suggesting bacterial vaginosis. - Advise to take tablets with plenty of water . - Inform about potential side effects: orange urine and metallic taste. - Encourage intake of yogurt or natural probiotics to support healthy flora.  Suspected pancreatic insufficiency Suspected pancreatic insufficiency with ongoing evaluation. Recent labs and fecal test pending. Current treatment includes pancreatic enzymes. - Complete fecal test to confirm or rule out pancreatic insufficiency. - Continue current management with pancreatic enzymes until diagnosis is confirmed or ruled out. - Follow up with gastroenterologist for further evaluation and management.   No follow-ups on file.     I discussed the assessment and treatment plan with the patient. The patient was provided an opportunity to ask questions and all were answered. The patient agreed with the plan and demonstrated an understanding of  the instructions.   The patient was advised to call back or seek an in-person evaluation if the symptoms worsen or if the condition fails to improve as anticipated.  I, Hulbert Branscome, PA-C have reviewed all documentation for this visit. The documentation on 01/30/2024  for the exam, diagnosis, procedures, and orders are all accurate and complete.  Jolynn Spencer, [facility], MMS [facility] [phone] (phone) [phone] (fax)  [facility]

## 2024-02-03 ENCOUNTER — Ambulatory Visit (INDEPENDENT_AMBULATORY_CARE_PROVIDER_SITE_OTHER): Admitting: Physician Assistant

## 2024-02-03 ENCOUNTER — Ambulatory Visit: Payer: Self-pay

## 2024-02-03 ENCOUNTER — Encounter: Payer: Self-pay | Admitting: Physician Assistant

## 2024-02-03 VITALS — BP 125/83 | HR 85 | Temp 98.8°F | Ht 65.5 in | Wt 193.8 lb

## 2024-02-03 DIAGNOSIS — G4489 Other headache syndrome: Secondary | ICD-10-CM | POA: Insufficient documentation

## 2024-02-03 DIAGNOSIS — R053 Chronic cough: Secondary | ICD-10-CM | POA: Diagnosis not present

## 2024-02-03 DIAGNOSIS — K8689 Other specified diseases of pancreas: Secondary | ICD-10-CM

## 2024-02-03 DIAGNOSIS — J309 Allergic rhinitis, unspecified: Secondary | ICD-10-CM | POA: Insufficient documentation

## 2024-02-03 DIAGNOSIS — Z91048 Other nonmedicinal substance allergy status: Secondary | ICD-10-CM | POA: Diagnosis not present

## 2024-02-03 DIAGNOSIS — G4709 Other insomnia: Secondary | ICD-10-CM | POA: Diagnosis not present

## 2024-02-03 DIAGNOSIS — J3089 Other allergic rhinitis: Secondary | ICD-10-CM

## 2024-02-03 DIAGNOSIS — F17200 Nicotine dependence, unspecified, uncomplicated: Secondary | ICD-10-CM

## 2024-02-03 LAB — INTERPRETATION:

## 2024-02-03 LAB — ALPHA-GAL PANEL
Allergen, Mutton, f88: <0.1 kU/L
Allergen, Pork, f26: <0.1 kU/L
Beef: <0.1 kU/L
CLASS: 0
CLASS: 0
Class: 0
GALACTOSE-ALPHA-1,3-GALACTOSE IGE*: <0.1 kU/L (ref <0.10)

## 2024-02-03 MED ORDER — PROMETHAZINE-DM 6.25-15 MG/5ML PO SYRP: 5.0000 mL | ORAL_SOLUTION | Freq: Four times a day (QID) | ORAL | 0 refills | Status: DC | PRN

## 2024-02-03 MED ORDER — TRAZODONE HCL 100 MG PO TABS: 200.0000 mg | ORAL_TABLET | Freq: Every day | ORAL | 0 refills | Status: AC

## 2024-02-03 MED ORDER — LEVOCETIRIZINE DIHYDROCHLORIDE 5 MG PO TABS
5.0000 mg | ORAL_TABLET | Freq: Every evening | ORAL | 2 refills | Status: DC
Start: 2024-02-03 — End: 2024-04-26

## 2024-02-03 NOTE — Progress Notes (Signed)
 Established patient visit  Patient: [name]   DOB: 1975/11/26   48 y.o. Female  MRN: [id number] Visit Date: 02/03/2024  Today's healthcare provider: Jolynn Spencer, PA-C   Chief Complaint  Patient presents with   Cough    Patient presenyts with cough x 3 weeks. Has been Dx with brochitis per UC, has done 2 rounds abx with no relief. States that she did an at home mold test and it showed that there was some present in her home unsure of what kind- wants to know if there are any labs that can be done    Insomnia    Patient reports insomnia since being sick, states she is intending to go to behavioral health but has not been able to with being sick. Would like refill just to get her to apppt    Subjective     HPI     Cough    Additional comments: Patient presenyts with cough x 3 weeks. Has been Dx with brochitis per UC, has done 2 rounds abx with no relief. States that she did an at home mold test and it showed that there was some present in her home unsure of what kind- wants to know if there are any labs that can be done         Insomnia    Additional comments: Patient reports insomnia since being sick, states she is intending to go to behavioral health but has not been able to with being sick. Would like refill just to get her to apppt       Last edited by Cherry Chiquita HERO, CMA on 02/03/2024  1:51 PM.       Discussed the use of AI scribe software for clinical note transcription with the patient, who gave verbal consent to proceed.  History of Present Illness [name] is a 49 year old female who presents with persistent cough and allergy  symptoms.  She experiences a persistent cough and allergy  symptoms, potentially related to mold exposure in her home. Mold growth was confirmed with a home testing kit. Previous treatments for upper respiratory infections and bronchitis, including a breathing treatment and doxycycline , have not resolved her symptoms.  Her  cough is not significantly relieved by albuterol . A prescribed cough medicine helped her rest at night, but she is now using Robitussin DM. A recent lab test showed an elevated white blood cell count.  Current medications include 40 mg of omeprazole  every morning, famotidine  at night, and daily Flonase . Allegra has been ineffective for her allergies. She has not recently started Mucinex  but has used it in the past.  She experiences nasal congestion, a sore throat, and a headache with frontal pressure. She smokes about five cigarettes a day and is attempting to reduce her smoking. She uses nasal saline spray.       01/16/2024    2:04 PM 11/11/2023    3:35 PM 07/04/2023    1:56 PM  Depression screen PHQ 2/9  Decreased Interest 1 1 0  Down, Depressed, Hopeless 1 1 0  PHQ - 2 Score 2 2 0  Altered sleeping 0 2 0  Tired, decreased energy 1 3 0  Change in appetite 0 0 0  Feeling bad or failure about yourself  0 0 0  Trouble concentrating 0 2 0  Moving slowly or fidgety/restless 0 0 0  Suicidal thoughts 0 0 0  PHQ-9 Score 3 9 0  Difficult doing work/chores Somewhat difficult  01/16/2024    2:04 PM 11/11/2023    3:35 PM 07/20/2020    8:09 AM  GAD 7 : Generalized Anxiety Score  Nervous, Anxious, on Edge 1 1 2   Control/stop worrying 0 0 3  Worry too much - different things 0 1 3  Trouble relaxing 1 1 1   Restless 0 0 0  Easily annoyed or irritable 1 2 2   Afraid - awful might happen 0 0 1  Total GAD 7 Score 3 5 12   Anxiety Difficulty Somewhat difficult Somewhat difficult Somewhat difficult    Medications: Outpatient Medications Prior to Visit  Medication Sig   albuterol  (VENTOLIN  HFA) 108 (90 Base) MCG/ACT inhaler Inhale 2 puffs into the lungs every 6 (six) hours as needed for wheezing or shortness of breath.   Blood Glucose Monitoring Suppl DEVI 1 each by Does not apply route 2 (two) times daily as needed. May substitute to any manufacturer covered by patient's insurance.    ezetimibe  (ZETIA ) 10 MG tablet Take 1 tablet (10 mg total) by mouth daily.   famotidine  (PEPCID ) 40 MG tablet Take 1 tablet (40 mg total) by mouth at bedtime.   fluconazole  (DIFLUCAN ) 150 MG tablet Take 150 mg by mouth once.   FLUoxetine  (PROZAC ) 40 MG capsule Take 1 capsule (40 mg total) by mouth daily.   gabapentin  (NEURONTIN ) 300 MG capsule Take 1 capsule (300 mg total) by mouth 2 (two) times daily.   lisinopril  (ZESTRIL ) 10 MG tablet Take 1 tablet (10 mg total) by mouth daily.   methocarbamol  (ROBAXIN ) 750 MG tablet Take 1 tablet (750 mg total) by mouth every 8 (eight) hours as needed for muscle spasms.   metroNIDAZOLE  (FLAGYL ) 500 MG tablet Take 1 tablet (500 mg total) by mouth 2 (two) times daily for 7 days.   omeprazole  (PRILOSEC) 40 MG capsule Take 1 capsule (40 mg total) by mouth daily.   Pancrelipase , Lip-Prot-Amyl, (CREON ) 24000-76000 units CPEP TAKE 1 CAPSULE BY MOUTH WITH THE FIRST BITE OF EACH MEAL AND 1 CAPSULE WITH THE FIRST BITE OF EACH SNACK   promethazine  (PHENERGAN ) 12.5 MG tablet Take 1 tablet (12.5 mg total) by mouth every 8 (eight) hours as needed for nausea or vomiting.   sucralfate  (CARAFATE ) 1 g tablet Take 1 tablet (1 g total) by mouth 4 (four) times daily -  with meals and at bedtime for 15 days.   valACYclovir  (VALTREX ) 500 MG tablet Take 2 tablets (1,000 mg total) by mouth 2 (two) times daily.   [DISCONTINUED] traZODone  (DESYREL ) 100 MG tablet Take 2 tablets (200 mg total) by mouth at bedtime.   [DISCONTINUED] doxycycline  (VIBRAMYCIN ) 100 MG capsule Take 100 mg by mouth 2 (two) times daily.   [DISCONTINUED] predniSONE  (DELTASONE ) 20 MG tablet Take 20 mg by mouth. Tapering dose   No facility-administered medications prior to visit.    Review of Systems All negative Except see HPI       Objective    BP 125/83 (BP Location: Left Arm, Patient Position: Sitting, Cuff Size: Normal)   Pulse 85   Temp 98.8 F (37.1 C) (Oral)   Ht 5' 5.5 (1.664 m)   Wt 193 lb  12.8 oz (87.9 kg)   SpO2 99%   BMI 31.76 kg/m     Physical Exam   No results found for any visits on 02/03/24.      Assessment & Plan Chronic cough Chronic cough persists despite albuterol  and previous antibiotics. Albuterol  ineffective. Possible relation to allergies or lingering viral infection. Smoking  may exacerbate cough. - Prescribe promethazine  with dextromethorphan  solution for cough. - Consider decongestant if cough persists. - Refer to allergist for evaluation of potential mold allergy . Smoking cessation advised  Allergic rhinitis Allergic rhinitis with nasal congestion and discharge, possibly related to mold exposure. Current treatment includes nasal saline spray, Flonase , and Allegra, but symptoms persist. Xyzal  considered for stronger antihistamine effect. - Prescribe Xyzal  for allergic rhinitis. - Continue nasal saline rinse or spray and Flonase  daily. - Refer to allergist for further evaluation.  Headache Headache described as frontal pressure sensation. Possible relation to sinus congestion or allergies or stress. - Advise to drink plenty of water . Symptomatic treatment advised advised Tylenol /ibuprofen  with food as needed  Insomnia Insomnia possibly related to chronic cough and discomfort. Previous cough medicine improved nighttime cough and rest. - Prescribe promethazine  with dextromethorphan  solution to aid with nighttime cough and improve sleep. Continue with trazodone  temporarily until [facility] assessment through [facility]  Tobacco use Currently smoking approximately five cigarettes a day. Efforts to reduce smoking are ongoing. - Encourage further reduction and cessation of smoking.  Suspected pancreatic insufficiency Awaiting results from stool sample to confirm pancreatic insufficiency versus acid reflux. Currently taking 40 mg of omeprazole  daily and famotidine  at night. - Await stool sample results for further evaluation.  Other insomnia  - traZODone   (DESYREL ) 100 MG tablet; Take 2 tablets (200 mg total) by mouth at bedtime.  Dispense: 60 tablet; Refill: 0  Chronic cough (Primary)  - traZODone  (DESYREL ) 100 MG tablet; Take 2 tablets (200 mg total) by mouth at bedtime.  Dispense: 60 tablet; Refill: 0 - levocetirizine (XYZAL ) 5 MG tablet; Take 1 tablet (5 mg total) by mouth every evening.  Dispense: 30 tablet; Refill: 2 - promethazine -dextromethorphan  (PROMETHAZINE -DM) 6.25-15 MG/5ML syrup; Take 5 mLs by mouth 4 (four) times daily as needed for cough.  Dispense: 240 mL; Refill: 0 - Ambulatory referral to Allergy   Allergy  to mold  - Ambulatory referral to Allergy    Orders Placed This Encounter  Procedures   Ambulatory referral to Allergy     Referral Priority:   Routine    Referral Type:   Allergy  Testing    Referral Reason:   Specialty Services Required    Requested Specialty:   Allergy     Number of Visits Requested:   1    No follow-ups on file.   The patient was advised to call back or seek an in-person evaluation if the symptoms worsen or if the condition fails to improve as anticipated.  I discussed the assessment and treatment plan with the patient. The patient was provided an opportunity to ask questions and all were answered. The patient agreed with the plan and demonstrated an understanding of the instructions.  I, Chardai Gangemi, PA-C have reviewed all documentation for this visit. The documentation on 02/03/2024  for the exam, diagnosis, procedures, and orders are all accurate and complete.  Jolynn Spencer, [facility], MMS [facility] [phone] (phone) [phone] (fax)  [facility]

## 2024-02-03 NOTE — Telephone Encounter (Signed)
 FYI Only or Action Required?: FYI only for provider.  Patient was last seen in primary care on 03/20/2023 by Emilio Kelly DASEN, FNP.  Called Nurse Triage reporting Cough.  Symptoms began x 3 weeks.  Interventions attempted: Prescription medications: ABX x 2 .  Symptoms are: gradually worsening.  Triage Disposition: See PCP When Office is Open (Within 3 Days)  Patient/caregiver understands and will follow disposition?: Yes   Copied from CRM #[id number]. Topic: Clinical - Red Word Triage >> Feb 03, 2024 11:44 AM Ivette P wrote: [facility] that prompted transfer to Nurse Triage: Trouble breathing, coughing like crazy, trazodone  refill is needed.   Mold exposure. Reason for Disposition  Cough has been present for > 3 weeks  Answer Assessment - Initial Assessment Questions 1. ONSET: When did the cough begin?      X 3 weeks 2. SEVERITY: How bad is the cough today?      Severe : has coughing spells lead to trouble catching breath, leads to n/v 3. SPUTUM: Describe the color of your sputum (e.g., none, dry cough; clear, white, yellow, green)     White foamy 4. HEMOPTYSIS: Are you coughing up any blood? If Yes, ask: How much? (e.g., flecks, streaks, tablespoons, etc.)     no 5. DIFFICULTY BREATHING: Are you having difficulty breathing? If Yes, ask: How bad is it? (e.g., mild, moderate, severe)      Only with bad coughing spells 6. FEVER: Do you have a fever? If Yes, ask: What is your temperature, how was it measured, and when did it start?     unknown 7. CARDIAC HISTORY: Do you have any history of heart disease? (e.g., heart attack, congestive heart failure)      na 8. LUNG HISTORY: Do you have any history of lung disease?  (e.g., pulmonary embolus, asthma, emphysema)     na 9. PE RISK FACTORS: Do you have a history of blood clots? (or: recent major surgery, recent prolonged travel, bedridden)     na 10. OTHER SYMPTOMS: Do you have any other symptoms? (e.g., runny  nose, wheezing, chest pain)       Runny nose, wheezing at night 11. PREGNANCY: Is there any chance you are pregnant? When was your last menstrual period?       na 12. TRAVEL: Have you traveled out of the country in the last month? (e.g., travel history, exposures)       Na   Pt has possible mold exposure : due to patient being sick so long she is requesting labs..  Protocols used: Cough - Acute Productive-A-AH

## 2024-02-04 ENCOUNTER — Other Ambulatory Visit

## 2024-02-04 DIAGNOSIS — K8689 Other specified diseases of pancreas: Secondary | ICD-10-CM

## 2024-02-04 DIAGNOSIS — R7989 Other specified abnormal findings of blood chemistry: Secondary | ICD-10-CM

## 2024-02-04 DIAGNOSIS — D72829 Elevated white blood cell count, unspecified: Secondary | ICD-10-CM

## 2024-02-04 DIAGNOSIS — R197 Diarrhea, unspecified: Secondary | ICD-10-CM

## 2024-02-05 LAB — GIARDIA ANTIGEN
MICRO NUMBER:: 16916765
RESULT:: NOT DETECTED
SPECIMEN QUALITY:: ADEQUATE

## 2024-02-06 ENCOUNTER — Telehealth: Payer: Self-pay | Admitting: Physician Assistant

## 2024-02-06 LAB — FECAL FAT, QUALITATIVE
Fat Qual Neutral, Stl: NORMAL
Fat Qual Total, Stl: NORMAL

## 2024-02-06 LAB — CALPROTECTIN, FECAL: Calprotectin, Fecal: 42 ug/g (ref 0–120)

## 2024-02-10 LAB — PANCREATIC ELASTASE, FECAL: Pancreatic Elastase-1, Stool: 25 ug/g — ABNORMAL LOW (ref >200)

## 2024-02-24 MED ORDER — PROMETHAZINE HCL 12.5 MG PO TABS: 12.5000 mg | ORAL_TABLET | Freq: Three times a day (TID) | ORAL | 0 refills | Status: DC | PRN

## 2024-03-15 ENCOUNTER — Other Ambulatory Visit: Payer: Self-pay

## 2024-03-15 ENCOUNTER — Encounter: Payer: Self-pay | Admitting: *Deleted

## 2024-03-15 ENCOUNTER — Emergency Department
Admission: EM | Admit: 2024-03-15 | Discharge: 2024-03-15 | Disposition: A | Discharge status: Home / Self Care | Attending: Emergency Medicine | Admitting: Emergency Medicine

## 2024-03-15 ENCOUNTER — Emergency Department

## 2024-03-15 DIAGNOSIS — R0789 Other chest pain: Secondary | ICD-10-CM | POA: Diagnosis not present

## 2024-03-15 DIAGNOSIS — W182XXA Fall in (into) shower or empty bathtub, initial encounter: Secondary | ICD-10-CM | POA: Diagnosis not present

## 2024-03-15 DIAGNOSIS — R079 Chest pain, unspecified: Secondary | ICD-10-CM | POA: Diagnosis present

## 2024-03-15 LAB — CBC
HCT: 41.4 % (ref 36.0–46.0)
Hemoglobin: 13.8 g/dL (ref 12.0–15.0)
MCH: 31.4 pg (ref 26.0–34.0)
MCHC: 33.3 g/dL (ref 30.0–36.0)
MCV: 94.1 fL (ref 80.0–100.0)
Platelets: 327 K/uL (ref 150–400)
RBC: 4.4 MIL/uL (ref 3.87–5.11)
RDW: 13.2 % (ref 11.5–15.5)
WBC: 10.9 K/uL — ABNORMAL HIGH (ref 4.0–10.5)
nRBC: 0 % (ref 0.0–0.2)

## 2024-03-15 LAB — BASIC METABOLIC PANEL WITH GFR
Anion gap: 13 (ref 5–15)
BUN: 15 mg/dL (ref 6–20)
CO2: 20 mmol/L — ABNORMAL LOW (ref 22–32)
Calcium: 8.7 mg/dL — ABNORMAL LOW (ref 8.9–10.3)
Chloride: 102 mmol/L (ref 98–111)
Creatinine, Ser: 0.62 mg/dL (ref 0.44–1.00)
GFR, Estimated: >60 mL/min (ref >60)
Glucose, Bld: 119 mg/dL — ABNORMAL HIGH (ref 70–99)
Potassium: 4.5 mmol/L (ref 3.5–5.1)
Sodium: 135 mmol/L (ref 135–145)

## 2024-03-15 LAB — D-DIMER, QUANTITATIVE: D-Dimer, Quant: <0.27 ug{FEU}/mL (ref 0.00–0.50)

## 2024-03-15 LAB — TROPONIN I (HIGH SENSITIVITY): Troponin I (High Sensitivity): 3 ng/L (ref <18)

## 2024-03-15 MED ORDER — OXYCODONE-ACETAMINOPHEN 5-325 MG PO TABS: 1.0000 | ORAL_TABLET | Freq: Four times a day (QID) | ORAL | 0 refills | Status: AC | PRN

## 2024-03-15 MED ORDER — LIDOCAINE 5 % EX PTCH
1.0000 | MEDICATED_PATCH | CUTANEOUS | Status: DC
  Administered 2024-03-15: 1 via TRANSDERMAL
  Filled 2024-03-15: qty 1

## 2024-03-15 MED ORDER — KETOROLAC TROMETHAMINE 15 MG/ML IJ SOLN
15.0000 mg | Freq: Once | INTRAMUSCULAR | Status: AC
  Administered 2024-03-15: 15 mg via INTRAVENOUS
  Filled 2024-03-15: qty 1

## 2024-03-15 MED ORDER — OXYCODONE-ACETAMINOPHEN 5-325 MG PO TABS
1.0000 | ORAL_TABLET | Freq: Once | ORAL | Status: AC
Start: 2024-03-15 — End: 2024-03-15
  Administered 2024-03-15: 1 via ORAL
  Filled 2024-03-15: qty 1

## 2024-03-15 NOTE — ED Triage Notes (Addendum)
 Pt ambulatory to triage.  Pt has pain beneath left breast for 3 weeks.  Pt saw pmd and was started on pain meds, steroids and muscle relaxers. Cig smoker.   Pt report sob.  Pt reports falling off the shower stool 3 weeks ago.  Pt alert  speech clear.

## 2024-03-15 NOTE — Discharge Instructions (Addendum)
 You are seen in the Emergency Department today for evaluation of your chest wall pain.  Your testing fortunately did not show emergency cause for this.  Follow-up with your primary care doctor for further evaluation of your symptoms.  Return to the ER for new or worsening symptoms.  You can take 1000 mg of Tylenol  every 6 hours and 600 mg of ibuprofen  every 6 hours to help with your pain. Do not take more than 4 grams of Tylenol  in a day. You can also try over the counter lidocaine  patches.  I have sent a prescription for a narcotic pain medicine for severe breathrough pain. Do not drink alcohol, drive or participate in any other potentially dangerous activities while taking this medication as it may make you sleepy. Do not take this medication with any other sedating medications, either prescription or over-the-counter.  This medication is intended for your use only - do not give any to anyone else and keep it in a secure place where nobody else, especially children, have access to it.  It can also cause or worsen constipation, so you may want to consider taking an over-the-counter stool softener while you are taking this medication.  Do not take the medication I have sent with your prior narcotic prescription sent by your primary care doctor.

## 2024-03-15 NOTE — ED Provider Notes (Signed)
 [facility] Provider Note    Event Date/Time   First MD Initiated Contact with Patient 03/15/24 2112     (approximate)   History   Chest Pain   HPI  [name] is a 48 year old female presenting to the emergency department for evaluation of chest pain.  About 3 weeks ago patient fell in the shower and hit her left side.  A week after that she began to notice some increasing pain.  Saw her primary care doctor and has been on hydrocodone , steroids, muscle relaxers but reports ongoing symptoms.  Presents to the ER in the setting of this.  Does note that her primary care doctor expressed concerns about alternative pathology such as possible blood clot.     Physical Exam   Triage Vital Signs: ED Triage Vitals  Encounter Vitals Group     BP 03/15/24 2004 (!) 118/96     Girls Systolic BP Percentile --      Girls Diastolic BP Percentile --      Boys Systolic BP Percentile --      Boys Diastolic BP Percentile --      Pulse Rate 03/15/24 2004 90     Resp 03/15/24 2004 20     Temp 03/15/24 2004 98.5 F (36.9 C)     Temp Source 03/15/24 2004 Oral     SpO2 03/15/24 2004 96 %     Weight 03/15/24 2001 200 lb (90.7 kg)     Height 03/15/24 2001 5' 6 (1.676 m)     Head Circumference --      Peak Flow --      Pain Score 03/15/24 2001 10     Pain Loc --      Pain Education --      Exclude from Growth Chart --     Most recent vital signs: Vitals:   03/15/24 2004  BP: (!) 118/96  Pulse: 90  Resp: 20  Temp: 98.5 F (36.9 C)  SpO2: 96%     General: Awake, interactive  CV:  Good peripheral perfusion Resp:  Unlabored respiration, lung clear to auscultation Chest wall: No overlying skin changes.  There is tenderness to palpation along the right chest wall without focal area of point tenderness Abd:  Nondistended.  Neuro:  Symmetric facial movement, fluid speech   ED Results / Procedures / Treatments   Labs (all labs ordered are listed, but only  abnormal results are displayed) Labs Reviewed  BASIC METABOLIC PANEL WITH GFR - Abnormal; Notable for the following components:      Result Value   CO2 20 (*)    Glucose, Bld 119 (*)    Calcium  8.7 (*)    All other components within normal limits  CBC - Abnormal; Notable for the following components:   WBC 10.9 (*)    All other components within normal limits  D-DIMER, QUANTITATIVE  TROPONIN I (HIGH SENSITIVITY)     EKG EKG independently reviewed and interpreted by myself demonstrates:  EKG demonstrate sinus tachycardia at a rate of 108, PR 126, QRS 82, QTc 460, no acute ST changes  RADIOLOGY Imaging independently reviewed and interpreted by myself demonstrates:  CXR without focal consolidation  Formal Radiology Read:  DG Chest 2 View Result Date: 03/15/2024 EXAM: 2 VIEW(S) XRAY OF THE CHEST 03/15/2024 08:20:09 PM COMPARISON: 11/14/2023 CLINICAL HISTORY: chest pain. Patient c/o pain under left breast x 3 weeks. Clemens off a stool in shower around that time. FINDINGS: LUNGS AND PLEURA:  No focal pulmonary opacity. No pulmonary edema. No pleural effusion. No pneumothorax. HEART AND MEDIASTINUM: No acute abnormality of the cardiac and mediastinal silhouettes. BONES AND SOFT TISSUES: No acute osseous abnormality. IMPRESSION: 1. No acute cardiopulmonary process. Electronically signed by: Pinkie Pebbles MD 03/15/2024 08:22 PM EDT RP Workstation: [id number]    PROCEDURES:  Critical Care performed: No  Procedures   MEDICATIONS ORDERED IN ED: Medications  lidocaine  (LIDODERM ) 5 % 1 patch (1 patch Transdermal Patch Applied 03/15/24 2131)  ketorolac  (TORADOL ) 15 MG/ML injection 15 mg (15 mg Intravenous Given 03/15/24 2144)  oxyCODONE -acetaminophen  (PERCOCET/ROXICET) 5-325 MG per tablet 1 tablet (1 tablet Oral Given 03/15/24 2130)     IMPRESSION / MDM / ASSESSMENT AND PLAN / ED COURSE  I reviewed the triage vital signs and the nursing notes.  Differential diagnosis includes, but is  not limited to, bruised rib, rib fracture, pneumothorax, pneumonia, early zoster prior to rash, much lower suspicion PE  Patient's presentation is most consistent with acute presentation with potential threat to life or bodily function.  48 year old female presenting to the emergency department for evaluation of ongoing left chest wall pain.  Stable vitals on presentation.  CBC with minimal leukocytosis, BMP without significant derangement.  Negative troponin with well over 3 hours of symptoms.  Dimer within normal limits and low risk.  Chest x-Nasha Diss without focal consolidation or other acute abnormality.  EKG without acute ischemic findings.  Patient treated with multimodal pain control here.  Did report improvement following this.  Question if she has limited benefit from hydrocodone , will trial short course of Percocet.  Discussed not taking these together.  Strict return precautions provided.  Patient discharged in stable condition with plans for outpatient follow-up.       FINAL CLINICAL IMPRESSION(S) / ED DIAGNOSES   Final diagnoses:  Left-sided chest wall pain     Rx / DC Orders   ED Discharge Orders          Ordered    oxyCODONE -acetaminophen  (PERCOCET) 5-325 MG tablet  Every 6 hours PRN        03/15/24 2249             Note:  This document was prepared using Dragon voice recognition software and may include unintentional dictation errors.   Levander Slate, MD 03/15/24 2251

## 2024-03-17 ENCOUNTER — Other Ambulatory Visit

## 2024-03-23 ENCOUNTER — Ambulatory Visit: Admitting: Gastroenterology

## 2024-03-23 ENCOUNTER — Encounter: Payer: Self-pay | Admitting: Gastroenterology

## 2024-03-23 VITALS — BP 95/67 | HR 85 | Temp 98.8°F | Resp 12 | Ht 65.5 in | Wt 196.0 lb

## 2024-03-23 DIAGNOSIS — Z1211 Encounter for screening for malignant neoplasm of colon: Secondary | ICD-10-CM | POA: Diagnosis present

## 2024-03-23 DIAGNOSIS — K2950 Unspecified chronic gastritis without bleeding: Secondary | ICD-10-CM

## 2024-03-23 DIAGNOSIS — D124 Benign neoplasm of descending colon: Secondary | ICD-10-CM

## 2024-03-23 DIAGNOSIS — R1013 Epigastric pain: Secondary | ICD-10-CM

## 2024-03-23 DIAGNOSIS — R197 Diarrhea, unspecified: Secondary | ICD-10-CM | POA: Diagnosis not present

## 2024-03-23 DIAGNOSIS — K640 First degree hemorrhoids: Secondary | ICD-10-CM | POA: Diagnosis not present

## 2024-03-23 DIAGNOSIS — B9681 Helicobacter pylori [H. pylori] as the cause of diseases classified elsewhere: Secondary | ICD-10-CM

## 2024-03-23 DIAGNOSIS — K5730 Diverticulosis of large intestine without perforation or abscess without bleeding: Secondary | ICD-10-CM

## 2024-03-23 MED ORDER — SODIUM CHLORIDE 0.9 % IV SOLN: 500.0000 mL | INTRAVENOUS | Status: DC

## 2024-03-23 NOTE — Progress Notes (Signed)
 Pt's states no medical or surgical changes since previsit or office visit.

## 2024-03-23 NOTE — Op Note (Signed)
 [facility] Patient Name: [name] Procedure Date: 03/23/2024 1:46 PM MRN: [id number] Endoscopist: Lynnie Bring , MD, [id number] Age: 48 Referring MD:  Date of Birth: January 24, 1976 Gender: Female Account #: [id number] Procedure:                Colonoscopy Indications:              High risk colon cancer surveillance: Personal                            history of advanced colonic polyps s/p EMR 10/2022.                            Diarrhea has resolved after quitting alcohol. Medicines:                Monitored Anesthesia Care Procedure:                Pre-Anesthesia Assessment:                           - Prior to the procedure, a History and Physical                            was performed, and patient medications and                            allergies were reviewed. The patient's tolerance of                            previous anesthesia was also reviewed. The risks                            and benefits of the procedure and the sedation                            options and risks were discussed with the patient.                            All questions were answered, and informed consent                            was obtained. Prior Anticoagulants: The patient has                            taken no anticoagulant or antiplatelet agents. ASA                            Grade Assessment: II - A patient with mild systemic                            disease. After reviewing the risks and benefits,                            the patient was deemed in satisfactory condition to  undergo the procedure.                           After obtaining informed consent, the colonoscope                            was passed under direct vision. Throughout the                            procedure, the patient's blood pressure, pulse, and                            oxygen saturations were monitored continuously. The                            CF [id number]  #[id number] was introduced through the anus                            and advanced to the 2 cm into the ileum. The                            colonoscopy was performed without difficulty. The                            patient tolerated the procedure well. The quality                            of the bowel preparation was good. The terminal                            ileum, ileocecal valve, appendiceal orifice, and                            rectum were photographed. Scope In: 2:18:14 PM Scope Out: 2:33:06 PM Scope Withdrawal Time: 0 hours 10 minutes 47 seconds  Total Procedure Duration: 0 hours 14 minutes 52 seconds  Findings:                 A 6 mm polyp was found in the mid descending colon.                            The polyp was sessile. The polyp was removed with a                            cold snare. Resection and retrieval were complete.                            The area of the transverse colon was carefully                            examined-site of previous EMR.                           A few small-mouthed diverticula were found in  the                            sigmoid colon.                           Non-bleeding internal hemorrhoids were found during                            retroflexion. The hemorrhoids were small and Grade                            I (internal hemorrhoids that do not prolapse).                           The terminal ileum appeared normal.                           Retroflexion in the right colon was performed.                           The exam was otherwise without abnormality on                            direct and retroflexion views. Random colon                            biopsies were not performed since previous biopsies                            were negative for microscopic colitis. Complications:            No immediate complications. Estimated Blood Loss:     Estimated blood loss: none. Impression:               - One 6 mm polyp in the  mid descending colon,                            removed with a cold snare. Resected and retrieved.                           - Minimal sigmoid diverticulosis                           - Non-bleeding internal hemorrhoids.                           - The examined portion of the ileum was normal.                           - The examination was otherwise normal on direct                            and retroflexion views. Recommendation:           - Patient has a contact number available for  emergencies. The signs and symptoms of potential                            delayed complications were discussed with the                            patient. Return to normal activities tomorrow.                            Written discharge instructions were provided to the                            patient.                           - Resume previous diet.                           - Continue present medications.                           - Await pathology results.                           - Repeat colonoscopy in 5 years for surveillance.                           - The findings and recommendations were discussed                            with the patient's family. Lynnie Bring, MD 03/23/2024 2:39:27 PM This report has been signed electronically.

## 2024-03-23 NOTE — Progress Notes (Signed)
 Called to room to assist during endoscopic procedure.  Patient ID and intended procedure confirmed with present staff. Received instructions for my participation in the procedure from the performing physician.

## 2024-03-23 NOTE — Patient Instructions (Addendum)
 -Handout on polyps, diverticulosis, hemorrhoids and gastritis provided -Await pathology results -Avoid nonsteroidal's (like ibuprofen ) -Stop all alcohol use for now. Stop smoking.  -Continue present medications. In 4 weeks, you can stop Pepcid  at bedtime and continue Protonix  40 mg daily.  YOU HAD AN ENDOSCOPIC PROCEDURE TODAY AT THE [facility]:   Refer to the procedure report that was given to you for any specific questions about what was found during the examination.  If the procedure report does not answer your questions, please call your gastroenterologist to clarify.  If you requested that your care partner not be given the details of your procedure findings, then the procedure report has been included in a sealed envelope for you to review at your convenience later.  YOU SHOULD EXPECT: Some feelings of bloating in the abdomen. Passage of more gas than usual.  Walking can help get rid of the air that was put into your GI tract during the procedure and reduce the bloating. If you had a lower endoscopy (such as a colonoscopy or flexible sigmoidoscopy) you may notice spotting of blood in your stool or on the toilet paper. If you underwent a bowel prep for your procedure, you may not have a normal bowel movement for a few days.  Please Note:  You might notice some irritation and congestion in your nose or some drainage.  This is from the oxygen used during your procedure.  There is no need for concern and it should clear up in a day or so.  SYMPTOMS TO REPORT IMMEDIATELY:  Following lower endoscopy (colonoscopy or flexible sigmoidoscopy):  Excessive amounts of blood in the stool  Significant tenderness or worsening of abdominal pains  Swelling of the abdomen that is new, acute  Fever of 100F or higher  Following upper endoscopy (EGD)  Vomiting of blood or coffee ground material  New chest pain or pain under the shoulder blades  Painful or persistently difficult swallowing  New  shortness of breath  Fever of 100F or higher  Black, tarry-looking stools  For urgent or emergent issues, a gastroenterologist can be reached at any hour by calling [phone]. Do not use MyChart messaging for urgent concerns.    DIET:  We do recommend a small meal at first, but then you may proceed to your regular diet.  Drink plenty of fluids but you should avoid alcoholic beverages for 24 hours.  ACTIVITY:  You should plan to take it easy for the rest of today and you should NOT DRIVE or use heavy machinery until tomorrow (because of the sedation medicines used during the test).    FOLLOW UP: Our staff will call the number listed on your records the next business day following your procedure.  We will call around 7:15- 8:00 am to check on you and address any questions or concerns that you may have regarding the information given to you following your procedure. If we do not reach you, we will leave a message.     If any biopsies were taken you will be contacted by phone or by letter within the next 1-3 weeks.  Please call us  at [phone] if you have not heard about the biopsies in 3 weeks.    SIGNATURES/CONFIDENTIALITY: You and/or your care partner have signed paperwork which will be entered into your electronic medical record.  These signatures attest to the fact that that the information above on your After Visit Summary has been reviewed and is understood.  Full responsibility of  the confidentiality of this discharge information lies with you and/or your care-partner.

## 2024-03-23 NOTE — Progress Notes (Signed)
 Sedate, gd SR, tolerated procedure well, VSS, report to RN

## 2024-03-23 NOTE — Op Note (Signed)
 [facility] Patient Name: [name] Procedure Date: 03/23/2024 1:57 PM MRN: [id number] Endoscopist: Lynnie Bring , MD, [id number] Age: 48 Referring MD:  Date of Birth: Nov 10, 1975 Gender: Female Account #: [id number] Procedure:                Upper GI endoscopy Indications:              Epigastric abdominal pain. H/O HP gastritis with                            intestinal metaplasia on EGD 2024. Medicines:                Monitored Anesthesia Care Procedure:                Pre-Anesthesia Assessment:                           - Prior to the procedure, a History and Physical                            was performed, and patient medications and                            allergies were reviewed. The patient's tolerance of                            previous anesthesia was also reviewed. The risks                            and benefits of the procedure and the sedation                            options and risks were discussed with the patient.                            All questions were answered, and informed consent                            was obtained. Prior Anticoagulants: The patient has                            taken no anticoagulant or antiplatelet agents. ASA                            Grade Assessment: II - A patient with mild systemic                            disease. After reviewing the risks and benefits,                            the patient was deemed in satisfactory condition to                            undergo the procedure.  After obtaining informed consent, the endoscope was                            passed under direct vision. Throughout the                            procedure, the patient's blood pressure, pulse, and                            oxygen saturations were monitored continuously. The                            Olympus scope [id number] was introduced through the                            mouth, and advanced  to the second part of duodenum.                            The upper GI endoscopy was accomplished without                            difficulty. The patient tolerated the procedure                            well. Scope In: Scope Out: Findings:                 The examined esophagus was normal with well-defined                            Z-line at 36 cm, examined by NBI.                           Localized mild inflammation characterized by                            erythema was found in the gastric antrum. Multiple                            biopsies were obtained using [place] protocol                            (LC-antrum, GC-antrum, LC-body, GC-body, angularis,                            fundus) and sent in separate jars for histology.                           The examined duodenum was normal. Biopsies were not                            obtained since previous biopsies were negative for                            celiac disease. Complications:  No immediate complications. Estimated Blood Loss:     Estimated blood loss: none. Impression:               - Minimal gastritis. Biopsied. Recommendation:           - Patient has a contact number available for                            emergencies. The signs and symptoms of potential                            delayed complications were discussed with the                            patient. Return to normal activities tomorrow.                            Written discharge instructions were provided to the                            patient.                           - Resume previous diet.                           - Continue present medications. In 4 weeks, can                            stop pepcid  QHS and continue Protonix  40 mg p.o.                            daily.                           - Await pathology results.                           - Avoid nonsteroidals.                           - Stop smoking                            - Stop all alcohol use for now.                           - The findings and recommendations were discussed                            with the patient's family. Lynnie Bring, MD 03/23/2024 2:35:59 PM This report has been signed electronically.

## 2024-03-23 NOTE — Progress Notes (Signed)
 01/29/2024 [name] [id number] Dec 03, 1975   Referring provider: Ostwalt, Janna, PA-C Primary GI doctor: Dr. Charlanne   ASSESSMENT AND PLAN:  Worsening postprandial diarrhea via x 6 months, fecal incontinence, increasing gas/bloating in setting of possible EPI and on Creon  without help  Describes oily/watery stools, pudding at times, multiple times a day, has had nocturnal symptoms On doxy now, in May augmentin .  No sick contacts, no traveling/camping, no new medications 11/01/2022 colonoscopy 3 polyps 5 to 8 mm transverse colon, 12 mm polyp transverse colon status post MR clip normal TI normal mucosa nonbleeding internal hemorrhoids unremarkable biopsies pathology revealed tubular adenoma, recall 3 years Previously low pancreatic elastase but think this may have been false positive, Creon  not helping CTAP unremarkable pancreas.   Most likely IBS-D or mixed however we will try to rule out infection, IBD, EPI, microscopic colitis -KUB to evaluate for stool burden/obstruction with previous history of constipation - stool samples to rule out infection with recent ABX, Diatherix given to the patient, giardia testing  -CRP/ESR ,fecal calprotectin with halo sign on previous CT with previous colonoscopy had no evidence of IBD - TSH  -Pancreatic elastase/fecal fat, Consider MRI or MRCP if fecal fat is present. -Add on citracel/benefiber, FODMAP, trial off lactulose  and lifestyle changes discussed -Discussed with the patient we will plan on colonoscopy and EGD for change in bowel habits rule out microscopic colitis -Will schedule for endoscopic evaluation, discussed with patient and agrees with plan. -Consider SIBO testing or xifaxin trial pending results - Consider pelvic floor referral   Epigastric AB pain radiating into her chest with history of GERD with history of H. Pylori, no dysphagia, patchy intestinal metaplasia on last EGD 2 years ago No dysphagia Can have khaki color stools or dark  tarry stools, no pepto, no iron supplement 06/25/2022 EGD normal duodenal bulb, H. pylori gastritis with patchy intestinal metaplasia present, normal esophagus and GE junction, negative celiac negative dysplasia H. pylori eradication breath test confirmed 11/14/2023 CTAP W no acute inflammatory process, showe fat halo sign in TI,  On omeprazole  40 mg once a day, occ takes tums On prednisone  now for bronchitis, no NSAIDS, rare alcohol, tobacco use - Continue pantoprazole  40 mg daily, take 30 minutes to 1 hour before meals. - Add Pepcid  40 mg at night. - Refill omeprazole  prescription. - diatherix H pylori - schedule EGD with colonoscopy, I discussed risks of EGD with patient today, including risk of sedation, bleeding or perforation. Patient provides understanding and gave verbal consent to proceed.   MAFLD     Latest Ref Rng & Units 11/14/2023   10:15 AM 11/12/2023    2:44 PM 02/12/2023    1:25 PM  Hepatic Function  Total Protein 6.5 - 8.1 g/dL 7.5  7.1  7.8   Albumin 3.5 - 5.0 g/dL 4.0  4.8  4.2   AST 15 - 41 U/L 31  33  34   ALT 0 - 44 U/L 24  28  32   Alk Phosphatase 38 - 126 U/L 61  96  70   Total Bilirubin 0.0 - 1.2 mg/dL 0.7  0.3  0.4   Bilirubin, Direct 0.0 - 0.2 mg/dL     0.2    Platelets 738  INR 07/04/2022 1.0  Rare ETOH several times a months Seen 11/14/2023 on CTAP W for abdominal pain hepatic steatosis no suspicious masses noncirrhotic configuration no intrahepatic bile duct dilation there is mild prominence of the extrahepatic bile duct most likely due to  postcholecystectomy status.   Pancreas is unremarkable no ductal dilation no inflammatory changes normal spleen - Suspect fatty liver, no LFT elevation - Recheck liver function, if elevated LFTs suggest hepatocellular workup. - CBC and LFT monitoring every 6 months - Consider elastography - Avoid alcohol - Weight loss discussed with the patient   Patient Care Team: Ostwalt, Janna, PA-C as PCP - General (Physician  Assistant)   HISTORY OF PRESENT ILLNESS: 48 y.o. female with a past medical history listed below presents for evaluation of diarrhea, GERD.    Patient presents as a new patient of Dr. Charlanne, previously saw [facility] gastroenterology Dr. Unk, last seen 01/22/2023 for elevated LFTs and abdominal pain.   Discussed the use of AI scribe software for clinical note transcription with the patient, who gave verbal consent to proceed.   History of Present Illness   [name] is a 48 year old female who presents with worsening diarrhea and fecal incontinence.   Over the past six months, she has experienced worsening diarrhea and fecal incontinence, necessitating proximity to a bathroom after meals. She describes her bowel movements as watery with oily chunks, sometimes resembling pudding, and rarely solid. She experiences nocturnal symptoms, occasionally waking up to find she has soiled her bed. Stool color varies from pale brown or khaki to occasionally tarry black. She does not use Pepto Bismol or iron supplements but takes Tums for indigestion.   She is currently taking Creon  with every meal, starting with her second bite, at the lowest dose due to severe constipation with higher doses. Despite this, she continues to experience postprandial diarrhea and bloating.   She has a history of abdominal pain, particularly in the upper abdomen and chest, distinct from her bloating and discomfort. A CT scan in June for abdominal pain and a previous endoscopy in January 2024 showed H. pylori gastritis, though subsequent breath tests were negative. A colonoscopy in May 2024 revealed three polyps and internal hemorrhoids.   She has a history of fatty liver and elevated liver function tests, with the last test in June being normal. Her gallbladder was previously removed. She consumes alcohol socially a couple of times a month and smokes. There is no family history of liver issues.   She experiences  swelling in her legs, particularly her ankles and calves, but denies rashes or joint pain. No significant nausea or vomiting. She is currently on doxycycline  and was on Augmentin  in May. She has not started any new supplements or medications in the last six months.           She  reports that she has been smoking cigarettes. She started smoking about 3 months ago. She has a 22.6 pack-year smoking history. She has never used smokeless tobacco. She reports current alcohol use of about 4.0 standard drinks of alcohol per week. She reports that she does not use drugs.   RELEVANT GI HISTORY, IMAGING AND LABS: Results   LABS Breath test: Negative   RADIOLOGY CT abdomen: No evidence of pancreatic insufficiency, fat halo sign (11/2023)   DIAGNOSTIC Endoscopy: H. pylori gastritis (06/2022) Colonoscopy: Three polyps removed, one 12 mm, internal hemorrhoids (11/01/2022)     GI Procedures:  Upper endoscopy 06/25/2022 Normal duodenal bulb, second portion of duodenum and third portion of duodenum, biopsied Antral erythema, biopsied Normal esophagus and GE junction DIAGNOSIS:  A. DUODENUM; COLD BIOPSY:  - MILD FOCAL PEPTIC DUODENITIS.  - PRESERVED VILLOUS ARCHITECTURE.  - NEGATIVE FOR FEATURES OF CELIAC, DYSPLASIA, AND MALIGNANCY.  B. STOMACH; COLD BIOPSY:  - MILD TO MODERATE CHRONIC ACTIVE GASTRITIS.  - IMMUNOCHEMICAL STAINS FOR HELICOBACTER PYLORI SHOW FOCAL ORGANISMS OF  H. PYLORI (SEE COMMENT)  - PATCHY INTESTINAL METAPLASIA IS PRESENT.  - NEGATIVE FOR DYSPLASIA AND MALIGNANCY.      Colonoscopy 11/01/2022 - Three 5 to 8 mm polyps in the transverse colon, removed with a cold snare. Resected and retrieved. - One 12 mm polyp in the transverse colon, removed with a hot snare. Resected and retrieved. Clip manufacturer: [employer]. Clip ( MR safe) was placed. - The examined portion of the ileum was normal. Biopsied. - Normal mucosa in the entire examined colon. Biopsied. - The distal  rectum and anal verge are normal on retroflexion view. - Non- bleeding external hemorrhoids. Pathology revealed tubular adenoma Terminal ileal biopsies were normal Random colon biopsies were normal CBC Labs (Brief)          Component Value Date/Time    WBC 5.6 11/14/2023 0931    RBC 4.16 11/14/2023 0931    HGB 13.0 11/14/2023 0931    HGB 14.0 11/12/2023 1444    HCT 39.2 11/14/2023 0931    HCT 41.9 11/12/2023 1444    PLT 261 11/14/2023 0931    PLT 349 11/12/2023 1444    MCV 94.2 11/14/2023 0931    MCV 95 11/12/2023 1444    MCV 91 05/14/2014 2239    MCH 31.3 11/14/2023 0931    MCHC 33.2 11/14/2023 0931    RDW 11.9 11/14/2023 0931    RDW 12.2 11/12/2023 1444    RDW 12.7 05/14/2014 2239    LYMPHSABS 2.8 11/12/2023 1444    LYMPHSABS 1.4 10/23/2013 1022    MONOABS 0.4 12/20/2022 1148    MONOABS 0.7 10/23/2013 1022    EOSABS 0.1 11/12/2023 1444    EOSABS 0.1 10/23/2013 1022    BASOSABS 0.0 11/12/2023 1444    BASOSABS 0.1 10/23/2013 1022      Recent Labs (within last 365 days)       Recent Labs    02/12/23 1325 11/12/23 1444 11/14/23 0931  HGB 14.5 14.0 13.0        CMP     Labs (Brief)          Component Value Date/Time    NA 138 11/14/2023 1015    NA 140 11/12/2023 1444    NA 139 05/14/2014 2239    K 3.9 11/14/2023 1015    K 3.5 05/14/2014 2239    CL 105 11/14/2023 1015    CL 108 (H) 05/14/2014 2239    CO2 20 (L) 11/14/2023 1015    CO2 22 05/14/2014 2239    GLUCOSE 103 (H) 11/14/2023 1015    GLUCOSE 129 (H) 05/14/2014 2239    BUN 10 11/14/2023 1015    BUN 10 11/12/2023 1444    BUN 6 (L) 05/14/2014 2239    CREATININE 0.80 11/14/2023 1015    CREATININE 1.10 05/14/2014 2239    CALCIUM  9.0 11/14/2023 1015    CALCIUM  8.4 (L) 05/14/2014 2239    PROT 7.5 11/14/2023 1015    PROT 7.1 11/12/2023 1444    ALBUMIN 4.0 11/14/2023 1015    ALBUMIN 4.8 11/12/2023 1444    AST 31 11/14/2023 1015    ALT 24 11/14/2023 1015    ALKPHOS 61 11/14/2023 1015    BILITOT 0.7  11/14/2023 1015    BILITOT 0.3 11/12/2023 1444    GFRNONAA >60 11/14/2023 1015    GFRNONAA 60 (L) 05/14/2014 2239  GFRNONAA >60 10/23/2013 1022    GFRAA 89 03/29/2020 1518    GFRAA >60 05/14/2014 2239    GFRAA >60 10/23/2013 1022          Latest Ref Rng & Units 11/14/2023   10:15 AM 11/12/2023    2:44 PM 02/12/2023    1:25 PM  Hepatic Function  Total Protein 6.5 - 8.1 g/dL 7.5  7.1  7.8   Albumin 3.5 - 5.0 g/dL 4.0  4.8  4.2   AST 15 - 41 U/L 31  33  34   ALT 0 - 44 U/L 24  28  32   Alk Phosphatase 38 - 126 U/L 61  96  70   Total Bilirubin 0.0 - 1.2 mg/dL 0.7  0.3  0.4   Bilirubin, Direct 0.0 - 0.2 mg/dL     0.2         Latest Ref Rng & Units 06/07/2022    1:21 AM  Hepatitis C  HCV Quanitative >50 IU/mL HCV Not Detected       Current Medications:    Current Outpatient Medications (Endocrine & Metabolic):    predniSONE  (DELTASONE ) 20 MG tablet, Take 20 mg by mouth. Tapering dose   Current Outpatient Medications (Cardiovascular):    ezetimibe  (ZETIA ) 10 MG tablet, Take 1 tablet (10 mg total) by mouth daily.   lisinopril  (ZESTRIL ) 10 MG tablet, Take 1 tablet (10 mg total) by mouth daily.   Current Outpatient Medications (Respiratory):    albuterol  (VENTOLIN  HFA) 108 (90 Base) MCG/ACT inhaler, Inhale 2 puffs into the lungs every 6 (six) hours as needed for wheezing or shortness of breath.   promethazine  (PHENERGAN ) 12.5 MG tablet, Take 1 tablet (12.5 mg total) by mouth every 8 (eight) hours as needed for nausea or vomiting.       Current Outpatient Medications (Other):    Blood Glucose Monitoring Suppl DEVI, 1 each by Does not apply route 2 (two) times daily as needed. May substitute to any manufacturer covered by patient's insurance.   doxycycline  (VIBRAMYCIN ) 100 MG capsule, Take 100 mg by mouth 2 (two) times daily.   famotidine  (PEPCID ) 40 MG tablet, Take 1 tablet (40 mg total) by mouth at bedtime.   fluconazole  (DIFLUCAN ) 150 MG tablet, Take 150 mg by mouth once.    FLUoxetine  (PROZAC ) 40 MG capsule, Take 1 capsule (40 mg total) by mouth daily.   gabapentin  (NEURONTIN ) 300 MG capsule, Take 1 capsule (300 mg total) by mouth 2 (two) times daily.   methocarbamol  (ROBAXIN ) 750 MG tablet, Take 1 tablet (750 mg total) by mouth every 8 (eight) hours as needed for muscle spasms.   Na Sulfate-K Sulfate-Mg Sulfate concentrate (SUPREP) 17.5-3.13-1.6 GM/177ML SOLN, Take 1 kit (354 mLs total) by mouth once for 1 dose.   Pancrelipase , Lip-Prot-Amyl, (CREON ) 24000-76000 units CPEP, TAKE 1 CAPSULE BY MOUTH WITH THE FIRST BITE OF EACH MEAL AND 1 CAPSULE WITH THE FIRST BITE OF EACH SNACK   traZODone  (DESYREL ) 100 MG tablet, Take 2 tablets (200 mg total) by mouth at bedtime.   valACYclovir  (VALTREX ) 500 MG tablet, Take 2 tablets (1,000 mg total) by mouth 2 (two) times daily.   omeprazole  (PRILOSEC) 40 MG capsule, Take 1 capsule (40 mg total) by mouth daily.   sucralfate  (CARAFATE ) 1 g tablet, Take 1 tablet (1 g total) by mouth 4 (four) times daily -  with meals and at bedtime for 15 days. (Patient not taking: Reported on 01/29/2024)   Medical History:  Past Medical History:  Diagnosis Date   Allergy      Anxiety     Arthritis     BRCA negative 07/2018    MyRisk neg   Bronchitis     Colon polyps     Depression     Duodenal ulcer     Family history of breast cancer      IBIS=13%/riskscore=8.4%   Family history of ovarian cancer     Family history of pancreatic cancer     Gallstones     GERD (gastroesophageal reflux disease)     H. pylori infection     History of frequent urinary tract infections     HLD (hyperlipidemia)     HTN (hypertension)     Hypoglycemia     IBS (irritable bowel syndrome)     Migraine headache      linger for days when they happen   Vertigo      none for several years        Allergies:  Allergies       Allergies  Allergen Reactions   Statins Other (See Comments)      myopathy   Codeine  Nausea And Vomiting   Sulfa Antibiotics  Rash   Vicodin [Hydrocodone -Acetaminophen ] Nausea Only      And itching all over        Surgical History:  She  has a past surgical history that includes Toe Surgery (Bilateral); Wisdom tooth extraction; Diagnostic laparoscopy; Tubal ligation; Cholecystectomy; Maximum access (mas)posterior lumbar interbody fusion (plif) 1 level (N/A, 05/06/2013); laparoscopic appendectomy (N/A, 12/08/2016); Abdominal hysterectomy (07/2006); Esophagogastroduodenoscopy (egd) with propofol  (N/A, 06/25/2022); Colonoscopy with propofol  (N/A, 11/01/2022); and bone spurs removed (Right). Family History:  Her family history includes Alcohol abuse in her father, sister, and sister; Anxiety disorder in her mother, sister, and sister; Arthritis in her father and mother; Breast cancer (age of onset: 77) in her maternal aunt; COPD in her mother; Cancer in her maternal grandmother; Colon cancer (age of onset: 13) in her paternal uncle; Colon cancer (age of onset: 67) in her maternal grandfather; Depression in her father, mother, sister, and sister; Drug abuse in her mother, sister, and sister; Epilepsy in her mother; Hypertension in her father and mother; Obesity in her mother; Ovarian cancer (age of onset: 30) in her maternal aunt; Pancreatic cancer (age of onset: 16) in her maternal grandmother; Sleep apnea in her father and mother; Stroke in her mother.   REVIEW OF SYSTEMS  : All other systems reviewed and negative except where noted in the History of Present Illness.   PHYSICAL EXAM: BP 124/80 (BP Location: Left Arm, Patient Position: Sitting, Cuff Size: Normal)   Pulse 68   Ht 5' 5.5 (1.664 m) Comment: height measured without shoes  Wt 196 lb 4 oz (89 kg)   BMI 32.16 kg/m  Physical Exam   GENERAL APPEARANCE: Well nourished, in no apparent distress. HEENT: No cervical lymphadenopathy, unremarkable thyroid, sclerae anicteric, conjunctiva pink. RESPIRATORY: Respiratory effort normal, breath sounds equal bilaterally  without rales, rhonchi, or wheezing. Lungs clear to auscultation. CARDIO: Regular rate and rhythm with no murmurs, rubs, or gallops, peripheral pulses intact. ABDOMEN: Soft, non-distended, active bowel sounds in all four quadrants, mild discomfort in right lower abdomen, no tenderness to palpation, no rebound, no mass appreciated. RECTAL: Declines. MUSCULOSKELETAL: Full range of motion, normal gait, without edema. SKIN: Dry, intact without rashes or lesions. No jaundice. NEURO: Alert, oriented, no focal deficits. PSYCH: Cooperative, normal mood and affect.  Alan JONELLE Coombs, PA-C    Attending physician's note   I have taken history, reviewed the chart and examined the patient. I performed a substantive portion of this encounter, including complete performance of at least one of the key components, in conjunction with the APP. I agree with the Advanced Practitioner's note, impression and recommendations.   For EGD/colon   Anselm Bring, MD [facility] [phone]

## 2024-03-24 ENCOUNTER — Telehealth: Payer: Self-pay | Admitting: *Deleted

## 2024-03-24 NOTE — Telephone Encounter (Signed)
  Follow up Call-     03/23/2024    1:20 PM  Call back number  Post procedure Call Back phone  # [phone]  Permission to leave phone message Yes     Patient questions:  Do you have a fever, pain , or abdominal swelling? No. Pain Score  0 *  Have you tolerated food without any problems? Yes.    Have you been able to return to your normal activities? Yes.    Do you have any questions about your discharge instructions: Diet   No. Medications  No. Follow up visit  No.  Do you have questions or concerns about your Care? No.  Actions: * If pain score is 4 or above: No action needed, pain <4.

## 2024-03-29 LAB — SURGICAL PATHOLOGY

## 2024-04-04 ENCOUNTER — Ambulatory Visit: Payer: Self-pay | Admitting: Gastroenterology

## 2024-04-14 ENCOUNTER — Other Ambulatory Visit: Payer: Self-pay | Admitting: Physical Medicine & Rehabilitation

## 2024-04-14 DIAGNOSIS — M5414 Radiculopathy, thoracic region: Secondary | ICD-10-CM

## 2024-04-19 ENCOUNTER — Ambulatory Visit (INDEPENDENT_AMBULATORY_CARE_PROVIDER_SITE_OTHER): Admitting: Urology

## 2024-04-19 VITALS — BP 128/86 | HR 79 | Ht 66.0 in | Wt 200.0 lb

## 2024-04-19 DIAGNOSIS — N3946 Mixed incontinence: Secondary | ICD-10-CM | POA: Diagnosis not present

## 2024-04-19 DIAGNOSIS — R3989 Other symptoms and signs involving the genitourinary system: Secondary | ICD-10-CM | POA: Diagnosis not present

## 2024-04-19 LAB — MICROSCOPIC EXAMINATION: Epithelial Cells (non renal): >10 /HPF — AB (ref 0–10)

## 2024-04-19 LAB — URINALYSIS, COMPLETE
Bilirubin, UA: NEGATIVE
Glucose, UA: NEGATIVE
Ketones, UA: NEGATIVE
Leukocytes,UA: NEGATIVE
Nitrite, UA: NEGATIVE
Protein,UA: NEGATIVE
Specific Gravity, UA: 1.03 (ref 1.005–1.030)
Urobilinogen, Ur: 0.2 mg/dL (ref 0.2–1.0)
pH, UA: 6 (ref 5.0–7.5)

## 2024-04-19 NOTE — Progress Notes (Signed)
 04/19/2024 8:22 AM   [name] December 23, 1975 [id number]  Referring provider: Rudolpho Norleen BIRCH, MD [address] [facility] [place],  [place] [address]  No chief complaint on file.   HPI: I was consulted to assist the patient's urinary incontinence.  Primary symptom is leaking with coughing sneezing bending and lifting and sometimes that she gags.  She also has urge incontinence.  She leaks with intercourse.  Sometimes she has mild bedwetting.  She wears 5 pads a day that are moderately wet but she changes them frequently as she is quite fastidious  She voids every 2 hours gets up twice twice at night.  Flow was strong but sometimes it pops and starts.  Sometimes she strains.  It takes more time.  She does not always feel empty  She gets a urinary tract infection approximately for 2 months.  She will get urgency and bleeding if she does not get treated quickly.  The symptoms respond to antibiotics.  She does not take daily aspirin or blood thinner.  She has a smoking history.  She had a CT scan with contrast November 14, 2023 and was normal  She has had a hysterectomy low back surgery  Bowel movements normal.  No treatment   PMH: Past Medical History:  Diagnosis Date   Allergy     Anxiety    Arthritis    BRCA negative 07/2018   MyRisk neg   Bronchitis    Colon polyps    Depression    Duodenal ulcer    Family history of breast cancer    IBIS=13%/riskscore=8.4%   Family history of ovarian cancer    Family history of pancreatic cancer    Gallstones    GERD (gastroesophageal reflux disease)    H. pylori infection    History of frequent urinary tract infections    HLD (hyperlipidemia)    HTN (hypertension)    Hypoglycemia    IBS (irritable bowel syndrome)    Migraine headache    linger for days when they happen   Vertigo    none for several years    Surgical History: Past Surgical History:  Procedure Laterality Date   ABDOMINAL HYSTERECTOMY  07/2006    PARTIAL   bone spurs removed Right    CHOLECYSTECTOMY     COLONOSCOPY WITH PROPOFOL  N/A 11/01/2022   Procedure: COLONOSCOPY WITH PROPOFOL ;  Surgeon: Unk Corinn Skiff, MD;  Location: [facility];  Service: Gastroenterology;  Laterality: N/A;   DIAGNOSTIC LAPAROSCOPY     test on bladder   ESOPHAGOGASTRODUODENOSCOPY (EGD) WITH PROPOFOL  N/A 06/25/2022   Procedure: ESOPHAGOGASTRODUODENOSCOPY (EGD) WITH PROPOFOL ;  Surgeon: Unk Corinn Skiff, MD;  Location: [facility];  Service: Endoscopy;  Laterality: N/A;   LAPAROSCOPIC APPENDECTOMY N/A 12/08/2016   Procedure: APPENDECTOMY LAPAROSCOPIC;  Surgeon: Jordis Laneta FALCON, MD;  Location: [facility];  Service: General;  Laterality: N/A;   MAXIMUM ACCESS (MAS)POSTERIOR LUMBAR INTERBODY FUSION (PLIF) 1 LEVEL N/A 05/06/2013   Procedure: LUMBAR FIVE-SACRAL ONE MAXIMUM ACCESS POSTERIOR LUMBAR INTERBODY FUSION;  Surgeon: Alm GORMAN Molt, MD;  Location: [facility];  Service: Neurosurgery;  Laterality: N/A;   TOE SURGERY Bilateral    ingrown toenail   TUBAL LIGATION     WISDOM TOOTH EXTRACTION      Home Medications:  Allergies as of 04/19/2024       Reactions   Statins Other (See Comments)   myopathy   Codeine  Nausea And Vomiting   Hydrocodone -acetaminophen  Itching, Nausea Only   Sulfa Antibiotics Rash  Vicodin [hydrocodone -acetaminophen ] Nausea Only   And itching all over        Medication List        Accurate as of April 19, 2024  8:22 AM. If you have any questions, ask your nurse or doctor.          albuterol  108 (90 Base) MCG/ACT inhaler Commonly known as: VENTOLIN  HFA Inhale 2 puffs into the lungs every 6 (six) hours as needed for wheezing or shortness of breath.   Blood Glucose Monitoring Suppl Devi 1 each by Does not apply route 2 (two) times daily as needed. May substitute to any manufacturer covered by patient's insurance.   Cholecalciferol 50 MCG (2000 UT) Tabs Take 2,000 Units by mouth.   ezetimibe  10 MG  tablet Commonly known as: Zetia  Take 1 tablet (10 mg total) by mouth daily.   famotidine  40 MG tablet Commonly known as: Pepcid  Take 1 tablet (40 mg total) by mouth at bedtime.   FLUoxetine  40 MG capsule Commonly known as: PROZAC  Take 1 capsule (40 mg total) by mouth daily.   gabapentin  300 MG capsule Commonly known as: NEURONTIN  Take 1 capsule (300 mg total) by mouth 2 (two) times daily.   levocetirizine 5 MG tablet Commonly known as: XYZAL  Take 1 tablet (5 mg total) by mouth every evening.   lisinopril  10 MG tablet Commonly known as: ZESTRIL  Take 1 tablet (10 mg total) by mouth daily.   methocarbamol  750 MG tablet Commonly known as: ROBAXIN  Take 1 tablet (750 mg total) by mouth every 8 (eight) hours as needed for muscle spasms.   Omega-3 Fish Oil 1200 MG Caps Take by mouth.   omeprazole  40 MG capsule Commonly known as: PRILOSEC Take 1 capsule (40 mg total) by mouth daily.   promethazine  12.5 MG tablet Commonly known as: PHENERGAN  Take 1 tablet (12.5 mg total) by mouth every 8 (eight) hours as needed for nausea or vomiting.   sucralfate  1 g tablet Commonly known as: Carafate  Take 1 tablet (1 g total) by mouth 4 (four) times daily -  with meals and at bedtime for 15 days.   traZODone  100 MG tablet Commonly known as: DESYREL  Take 2 tablets (200 mg total) by mouth at bedtime.   valACYclovir  500 MG tablet Commonly known as: VALTREX  Take 2 tablets (1,000 mg total) by mouth 2 (two) times daily.        Allergies:  Allergies  Allergen Reactions   Statins Other (See Comments)    myopathy   Codeine  Nausea And Vomiting   Hydrocodone -Acetaminophen  Itching and Nausea Only   Sulfa Antibiotics Rash   Vicodin [Hydrocodone -Acetaminophen ] Nausea Only    And itching all over    Family History: Family History  Problem Relation Age of Onset   Hypertension Mother    COPD Mother    Epilepsy Mother    Sleep apnea Mother    Anxiety disorder Mother    Arthritis  Mother    Depression Mother    Drug abuse Mother    Obesity Mother    Stroke Mother    Hypertension Father    Sleep apnea Father    Alcohol abuse Father    Arthritis Father    Depression Father    Alcohol abuse Sister    Anxiety disorder Sister    Depression Sister    Drug abuse Sister    Alcohol abuse Sister    Anxiety disorder Sister    Depression Sister    Drug abuse Sister  Breast cancer Maternal Aunt 42   Ovarian cancer Maternal Aunt 45   Colon cancer Paternal Uncle 50   Pancreatic cancer Maternal Grandmother 56   Cancer Maternal Grandmother    Colon cancer Maternal Grandfather 34   Stomach cancer Neg Hx    Esophageal cancer Neg Hx    Rectal cancer Neg Hx     Social History:  reports that she has been smoking cigarettes. She started smoking about 5 months ago. She has a 22.7 pack-year smoking history. She has never used smokeless tobacco. She reports current alcohol use of about 4.0 standard drinks of alcohol per week. She reports that she does not use drugs.  ROS:                                        Physical Exam: There were no vitals taken for this visit.  Constitutional:  Alert and oriented, No acute distress. HEENT: [place] AT, moist mucus membranes.  Trachea midline, no masses. Cardiovascular: No clubbing, cyanosis, or edema. Respiratory: Normal respiratory effort, no increased work of breathing. GI: Abdomen is soft, nontender, nondistended, no abdominal masses GU: On pelvic examination patient had grade 2 hypermobility the bladder neck and a positive cough test.  She had a high grade 1 asymptomatic cystocele.  Little to no posterior defect. Skin: No rashes, bruises or suspicious lesions. Lymph: No cervical or inguinal adenopathy. Neurologic: Grossly intact, no focal deficits, moving all 4 extremities. Psychiatric: Normal mood and affect.  Laboratory Data: Lab Results  Component Value Date   WBC 10.9 (H) 03/15/2024   HGB 13.8  03/15/2024   HCT 41.4 03/15/2024   MCV 94.1 03/15/2024   PLT 327 03/15/2024    Lab Results  Component Value Date   CREATININE 0.62 03/15/2024    No results found for: PSA  Lab Results  Component Value Date   TESTOSTERONE  304 (H) 12/25/2023    Lab Results  Component Value Date   HGBA1C 5.2 11/12/2023    Urinalysis    Component Value Date/Time   COLORURINE STRAW (A) 12/20/2022 1700   APPEARANCEUR CLEAR (A) 12/20/2022 1700   APPEARANCEUR CLOUDY 10/23/2013 1012   LABSPEC 1.004 (L) 12/20/2022 1700   LABSPEC 1.020 10/23/2013 1012   PHURINE 6.0 12/20/2022 1700   GLUCOSEU NEGATIVE 12/20/2022 1700   GLUCOSEU NEGATIVE 10/23/2013 1012   HGBUR NEGATIVE 12/20/2022 1700   BILIRUBINUR NEGATIVE 12/20/2022 1700   BILIRUBINUR negative 06/03/2022 1706   BILIRUBINUR small 07/08/2018 1130   BILIRUBINUR 1+ 10/23/2013 1012   KETONESUR NEGATIVE 12/20/2022 1700   PROTEINUR NEGATIVE 12/20/2022 1700   UROBILINOGEN 0.2 06/03/2022 1706   NITRITE NEGATIVE 12/20/2022 1700   LEUKOCYTESUR NEGATIVE 12/20/2022 1700   LEUKOCYTESUR 3+ 10/23/2013 1012    Pertinent Imaging: Urine reviewed.  Urine sent for culture.  Chart reviewed  Assessment & Plan: Patient has mixed incontinence and primarily stress incontinence.  She has mild bedwetting.  She does have some moderate flow symptoms.  She reports recurrent urinary tract infections associated blood in the urine with a smoking history.  She will return for urodynamics and cystoscopy and we will proceed accordingly.  Call if culture positive  Patient l may need a sling or bulking agent to reach her treatment goal.  Having said that she does have elements of an overactive bladder with mild bedwetting.  Cystoscopy will help clear her for hematuria.  She may or may not  need prophylaxis in the future.  She did not think she was clinically infected today  1. Mixed incontinence (Primary)  - Urinalysis, Complete   No follow-ups on file.  Glendia DELENA Elizabeth, MD  [facility] [address], [address] [place], [place] [address] [phone]

## 2024-04-19 NOTE — Patient Instructions (Signed)

## 2024-04-21 ENCOUNTER — Other Ambulatory Visit: Payer: Self-pay | Admitting: Internal Medicine

## 2024-04-21 ENCOUNTER — Ambulatory Visit
Admission: RE | Admit: 2024-04-21 | Discharge: 2024-04-21 | Disposition: A | Discharge status: Home / Self Care | Source: Ambulatory Visit | Attending: Physical Medicine & Rehabilitation | Admitting: Physical Medicine & Rehabilitation

## 2024-04-21 DIAGNOSIS — M5414 Radiculopathy, thoracic region: Secondary | ICD-10-CM

## 2024-04-21 DIAGNOSIS — Z1231 Encounter for screening mammogram for malignant neoplasm of breast: Secondary | ICD-10-CM

## 2024-04-22 LAB — CULTURE, URINE COMPREHENSIVE

## 2024-04-25 ENCOUNTER — Other Ambulatory Visit: Payer: Self-pay | Admitting: Physician Assistant

## 2024-04-25 DIAGNOSIS — R053 Chronic cough: Secondary | ICD-10-CM

## 2024-04-25 DIAGNOSIS — J3089 Other allergic rhinitis: Secondary | ICD-10-CM

## 2024-04-26 ENCOUNTER — Encounter: Payer: Self-pay | Admitting: Urology

## 2024-05-10 ENCOUNTER — Ambulatory Visit: Admitting: Urology

## 2024-05-26 ENCOUNTER — Other Ambulatory Visit: Payer: Self-pay | Admitting: Physician Assistant

## 2024-05-26 DIAGNOSIS — R053 Chronic cough: Secondary | ICD-10-CM

## 2024-05-26 DIAGNOSIS — J3089 Other allergic rhinitis: Secondary | ICD-10-CM

## 2024-05-28 ENCOUNTER — Encounter

## 2024-06-02 ENCOUNTER — Emergency Department: Admission: EM | Admit: 2024-06-02 | Discharge: 2024-06-02 | Disposition: A | Discharge status: Home / Self Care

## 2024-06-02 ENCOUNTER — Emergency Department

## 2024-06-02 ENCOUNTER — Other Ambulatory Visit: Payer: Self-pay

## 2024-06-02 DIAGNOSIS — I10 Essential (primary) hypertension: Secondary | ICD-10-CM | POA: Diagnosis not present

## 2024-06-02 DIAGNOSIS — R110 Nausea: Secondary | ICD-10-CM | POA: Insufficient documentation

## 2024-06-02 DIAGNOSIS — R1021 Pelvic and perineal pain right side: Secondary | ICD-10-CM | POA: Diagnosis present

## 2024-06-02 DIAGNOSIS — R509 Fever, unspecified: Secondary | ICD-10-CM | POA: Diagnosis not present

## 2024-06-02 DIAGNOSIS — R102 Pelvic and perineal pain unspecified side: Secondary | ICD-10-CM

## 2024-06-02 LAB — COMPREHENSIVE METABOLIC PANEL WITH GFR
ALT: 46 U/L — ABNORMAL HIGH (ref 0–44)
AST: 56 U/L — ABNORMAL HIGH (ref 15–41)
Albumin: 4.4 g/dL (ref 3.5–5.0)
Alkaline Phosphatase: 74 U/L (ref 38–126)
Anion gap: 11 (ref 5–15)
BUN: 12 mg/dL (ref 6–20)
CO2: 23 mmol/L (ref 22–32)
Calcium: 9.3 mg/dL (ref 8.9–10.3)
Chloride: 101 mmol/L (ref 98–111)
Creatinine, Ser: 0.81 mg/dL (ref 0.44–1.00)
GFR, Estimated: >60 mL/min
Glucose, Bld: 99 mg/dL (ref 70–99)
Potassium: 4.7 mmol/L (ref 3.5–5.1)
Sodium: 136 mmol/L (ref 135–145)
Total Bilirubin: 0.3 mg/dL (ref 0.0–1.2)
Total Protein: 7.1 g/dL (ref 6.5–8.1)

## 2024-06-02 LAB — URINALYSIS, ROUTINE W REFLEX MICROSCOPIC
Bilirubin Urine: NEGATIVE
Glucose, UA: NEGATIVE mg/dL
Hgb urine dipstick: NEGATIVE
Ketones, ur: NEGATIVE mg/dL
Leukocytes,Ua: NEGATIVE
Nitrite: NEGATIVE
Protein, ur: NEGATIVE mg/dL
Specific Gravity, Urine: 1.015 (ref 1.005–1.030)
pH: 5 (ref 5.0–8.0)

## 2024-06-02 LAB — LIPASE, BLOOD: Lipase: 38 U/L (ref 11–51)

## 2024-06-02 LAB — CBC
HCT: 38.8 % (ref 36.0–46.0)
Hemoglobin: 13 g/dL (ref 12.0–15.0)
MCH: 31.7 pg (ref 26.0–34.0)
MCHC: 33.5 g/dL (ref 30.0–36.0)
MCV: 94.6 fL (ref 80.0–100.0)
Platelets: 242 K/uL (ref 150–400)
RBC: 4.1 MIL/uL (ref 3.87–5.11)
RDW: 13.1 % (ref 11.5–15.5)
WBC: 7.3 K/uL (ref 4.0–10.5)
nRBC: 0 % (ref 0.0–0.2)

## 2024-06-02 LAB — PREGNANCY, URINE: Preg Test, Ur: NEGATIVE

## 2024-06-02 MED ORDER — MORPHINE SULFATE (PF) 4 MG/ML IV SOLN
4.0000 mg | Freq: Once | INTRAVENOUS | Status: AC
  Administered 2024-06-02: 4 mg via INTRAVENOUS
  Filled 2024-06-02: qty 1

## 2024-06-02 MED ORDER — SODIUM CHLORIDE 0.9 % IV BOLUS
1000.0000 mL | Freq: Once | INTRAVENOUS | Status: AC
  Administered 2024-06-02: 1000 mL via INTRAVENOUS

## 2024-06-02 MED ORDER — PROMETHAZINE HCL 12.5 MG PO TABS: 12.5000 mg | ORAL_TABLET | Freq: Three times a day (TID) | ORAL | 0 refills | Status: AC | PRN

## 2024-06-02 MED ORDER — OXYCODONE HCL 5 MG PO TABS
5.0000 mg | ORAL_TABLET | Freq: Once | ORAL | Status: AC
  Administered 2024-06-02: 5 mg via ORAL
  Filled 2024-06-02: qty 1

## 2024-06-02 MED ORDER — KETOROLAC TROMETHAMINE 30 MG/ML IJ SOLN
30.0000 mg | Freq: Once | INTRAMUSCULAR | Status: AC
  Administered 2024-06-02: 30 mg via INTRAMUSCULAR
  Filled 2024-06-02: qty 1

## 2024-06-02 MED ORDER — OXYCODONE-ACETAMINOPHEN 5-325 MG PO TABS: 1.0000 | ORAL_TABLET | Freq: Four times a day (QID) | ORAL | 0 refills | Status: AC | PRN

## 2024-06-02 MED ORDER — IOHEXOL 300 MG/ML  SOLN
100.0000 mL | Freq: Once | INTRAMUSCULAR | Status: AC | PRN
  Administered 2024-06-02: 100 mL via INTRAVENOUS

## 2024-06-02 MED ORDER — PROMETHAZINE (PHENERGAN) 6.25MG IN NS 50ML IVPB
6.2500 mg | Freq: Four times a day (QID) | INTRAVENOUS | Status: DC | PRN
  Administered 2024-06-02: 6.25 mg via INTRAVENOUS
  Filled 2024-06-02: qty 6.25

## 2024-06-02 MED ORDER — ONDANSETRON 4 MG PO TBDP
4.0000 mg | ORAL_TABLET | Freq: Once | ORAL | Status: AC
  Administered 2024-06-02: 4 mg via ORAL
  Filled 2024-06-02: qty 1

## 2024-06-02 NOTE — ED Triage Notes (Signed)
 Pt presents to ED from home C/O lower abdominal/pelvic pain X 2 days. Endorses headache, nausea, diarrhea, fever, chills. Denies vomiting.

## 2024-06-02 NOTE — Discharge Instructions (Signed)
 Your blood work, CT scan and pelvic ultrasound were all normal.  I believe your abdominal pain is due to a viral gastroenteritis or the stomach flu.  I have sent nausea medication and pain medication for you to take.  Please return to the emergency department with any worsening symptoms.  I have sent a strong pain medication called Percocet to the pharmacy.  This medication can be taken every 4-6 hours as needed for severe or breakthrough pain.  This medication can cause dependency so only take if you are unable to control your pain with other medications.  It will make you sleepy so do not drive or operate heavy machinery after taking it.  Do not drink alcohol while taking this medication.  This medication can also cause constipation so please take an over-the-counter stool softener like MiraLAX  or Colace while taking it.  This medication contains both oxycodone  and acetaminophen .  Do not take Tylenol  at the same time.  You can take the Percocet or Tylenol  but not both.

## 2024-06-02 NOTE — ED Notes (Signed)
 Called lab to verify they had the urine sample for the pregnancy test. Ultrasound holding until result posts.

## 2024-06-02 NOTE — ED Provider Notes (Signed)
 "   [facility] Emergency Department Provider Note     Event Date/Time   First MD Initiated Contact with Patient 06/02/24 1532     (approximate)   History   Abdominal Pain   HPI  [name] is a 48 y.o. female with a past medical history of IBS, HTN, HLD presents to the ED for evaluation of localized right pelvic pain x 2 days with associated nausea without vomiting.  Denies urinary symptoms such as vaginal bleeding or dysuria.  Patient also endorses nausea and subjective fevers at home.     Physical Exam   Triage Vital Signs: ED Triage Vitals  Encounter Vitals Group     BP 06/02/24 1251 (!) 142/88     Girls Systolic BP Percentile --      Girls Diastolic BP Percentile --      Boys Systolic BP Percentile --      Boys Diastolic BP Percentile --      Pulse Rate 06/02/24 1251 78     Resp 06/02/24 1251 16     Temp 06/02/24 1251 98.4 F (36.9 C)     Temp Source 06/02/24 1251 Oral     SpO2 06/02/24 1251 99 %     Weight 06/02/24 1252 200 lb (90.7 kg)     Height 06/02/24 1252 5' 6 (1.676 m)     Head Circumference --      Peak Flow --      Pain Score 06/02/24 1252 9     Pain Loc --      Pain Education --      Exclude from Growth Chart --     Most recent vital signs: Vitals:   06/02/24 1251  BP: (!) 142/88  Pulse: 78  Resp: 16  Temp: 98.4 F (36.9 C)  SpO2: 99%    General Awake, no distress.  Well-appearing HEENT NCAT.  CV:  Good peripheral perfusion.  RRR RESP:  Normal effort.  LCTAB ABD:  No distention.  Soft, nontender. Other:  Tenderness to right pelvic region.   ED Results / Procedures / Treatments   Labs (all labs ordered are listed, but only abnormal results are displayed) Labs Reviewed  COMPREHENSIVE METABOLIC PANEL WITH GFR - Abnormal; Notable for the following components:      Result Value   AST 56 (*)    ALT 46 (*)    All other components within normal limits  URINALYSIS, ROUTINE W REFLEX MICROSCOPIC - Abnormal;  Notable for the following components:   Color, Urine YELLOW (*)    APPearance HAZY (*)    All other components within normal limits  LIPASE, BLOOD  CBC  PREGNANCY, URINE   RADIOLOGY  I personally viewed and evaluated these images as part of my medical decision making, as well as reviewing the written report by the radiologist.  US  PELVIC COMPLETE W TRANSVAGINAL AND TORSION R/O Result Date: 06/02/2024 EXAM: US  Pelvis, Complete Transvaginal and Transabdominal without Doppler TECHNIQUE: Transabdominal and transvaginal pelvic duplex ultrasound using B-mode/gray scaled imaging without Doppler spectral analysis and color flow was obtained. COMPARISON: US  Pelvis Complete 10/16/2017 (Report only). CT abdomen and pelvis 11/14/23 CLINICAL HISTORY: Pelvic pain for 2 days and history of hysterectomy in 2008. FINDINGS: UTERUS: Status post hysterectomy. ENDOMETRIAL STRIPE: Not applicable due to hysterectomy. RIGHT OVARY: Right ovary measures 3.0 x 1.6 x 1.7 cm, volume 4 ml. Right ovary is within normal limits. There is normal arterial and venous Doppler flow. LEFT OVARY: Left ovary not  visualized. FREE FLUID: No free fluid. LIMITATIONS: Limited evaluation due to bowel gas. IMPRESSION: 1. Status post hysterectomy with nonvisualization of the left ovary and limited evaluation due to bowel gas. 2. No acute sonographic abnormality is identified. Electronically signed by: Morgane Naveau MD 06/02/2024 05:34 PM EST RP Workstation: [id number]    PROCEDURES:  Critical Care performed: No  Procedures   MEDICATIONS ORDERED IN ED: Medications  ketorolac  (TORADOL ) 30 MG/ML injection 30 mg (30 mg Intramuscular Given 06/02/24 1422)  ondansetron  (ZOFRAN -ODT) disintegrating tablet 4 mg (4 mg Oral Given 06/02/24 1423)  oxyCODONE  (Oxy IR/ROXICODONE ) immediate release tablet 5 mg (5 mg Oral Given 06/02/24 1717)     IMPRESSION / MDM / ASSESSMENT AND PLAN / ED COURSE  I reviewed the triage vital signs and the nursing  notes.                              Clinical Course as of 06/02/24 1808  Wed Jun 02, 2024  1404 Unilateral pelvic pain x 2 days with nausea and low grade fever. Diarrhea yesterday that has resolved. No appendix. U/S r/o ovarian torsianl. Poss gasterentiritis  [MH]  1743 US  PELVIC COMPLETE W TRANSVAGINAL AND TORSION R/O IMPRESSION: 1. Status post hysterectomy with nonvisualization of the left ovary and limited evaluation due to bowel gas. 2. No acute sonographic abnormality is identified.   [MH]    Clinical Course User Index [MH] Margrette Monte A, PA-C    48 y.o. female presents to the emergency department for evaluation and treatment of acute unilateral right pelvic pain. See HPI for further details.   Differential diagnosis includes, but is not limited to ovarian cyst, ovarian torsion, diverticulitis, urinary tract infection/pyelonephritis, bowel obstruction, colitis,  gastroenteritis, IBS flare  Patient's presentation is most consistent with acute complicated illness / injury requiring diagnostic workup.  Patient is alert and oriented.  She is hemodynamic stable and afebrile.  On initial assessment patient is nontoxic-appearing and sitting comfortably on evaluation bed.  Physical exam findings are pertinent for localized right sided pelvic tenderness to palpation.  Patient has a history of a partial hysterectomy and appendectomy.  Plan is to obtain ultrasound to rule out ovarian torsion.  If this is negative we will consider CT abdomen pelvis.  Lab work is reassuring.  No leukocytosis.  Lipase is normal.  No infectious etiology on urinalysis.  ----------------------------------------- 6:05 PM on 06/02/2024 -----------------------------------------  Negative ultrasound.  Will obtain CT abdomen pelvis.  I presume if this is normal patient will be in stable condition for discharge home and outpatient management.  At this time patient is in stable condition.  She is in agreement with  obtaining CT abdomen pelvis.  Transferring care to colleague, Cleaster, PA-C who will follow-up on CT scan and make appropriate disposition.   FINAL CLINICAL IMPRESSION(S) / ED DIAGNOSES   Final diagnoses:  Pelvic pain in female   Rx / DC Orders   ED Discharge Orders     None        Note:  This document was prepared using Dragon voice recognition software and may include unintentional dictation errors.    Margrette, Jasneet Schobert A, PA-C 06/02/24 NELIDA    Floy Roberts, MD 06/02/24 [phone]  "

## 2024-06-02 NOTE — ED Provider Notes (Signed)
 ----------------------------------------- 8:24 PM on 06/02/2024 -----------------------------------------  Blood pressure 139/80, pulse 63, temperature 98.9 F (37.2 C), temperature source Oral, resp. rate 20, height 5' 6 (1.676 m), weight 90.7 kg, SpO2 98%.  Assuming care from [facility], PA-C.  In short, [name] is a 48 y.o. female with a chief complaint of Abdominal Pain .  Refer to the original H&P for additional details.  The current plan of care is to wait for results of Ct scan and dispo accordingly.  ____________________________________________    ED Results / Procedures / Treatments   Labs (all labs ordered are listed, but only abnormal results are displayed) Labs Reviewed  COMPREHENSIVE METABOLIC PANEL WITH GFR - Abnormal; Notable for the following components:      Result Value   AST 56 (*)    ALT 46 (*)    All other components within normal limits  URINALYSIS, ROUTINE W REFLEX MICROSCOPIC - Abnormal; Notable for the following components:   Color, Urine YELLOW (*)    APPearance HAZY (*)    All other components within normal limits  LIPASE, BLOOD  CBC  PREGNANCY, URINE     RADIOLOGY  I personally viewed and evaluated these images as part of my medical decision making, as well as reviewing the written report by the radiologist.  ED Provider Interpretation: CT scan is negative for acute abnormalities.  CT ABDOMEN PELVIS W CONTRAST Result Date: 06/02/2024 EXAM: CT ABDOMEN AND PELVIS WITH CONTRAST 06/02/2024 07:53:30 PM TECHNIQUE: CT of the abdomen and pelvis was performed with the administration of 100 mL of iohexol  (OMNIPAQUE ) 300 MG/ML solution. Multiplanar reformatted images are provided for review. Automated exposure control, iterative reconstruction, and/or weight-based adjustment of the mA/kV was utilized to reduce the radiation dose to as low as reasonably achievable. COMPARISON: 11/14/2023, CLINICAL HISTORY: Diverticulitis, complication suspected.  FINDINGS: LOWER CHEST: No acute abnormality. LIVER: The liver is unremarkable. GALLBLADDER AND BILE DUCTS: Status post cholecystectomy. No biliary ductal dilatation. SPLEEN: No acute abnormality. PANCREAS: No acute abnormality. ADRENAL GLANDS: No acute abnormality. KIDNEYS, URETERS AND BLADDER: No stones in the kidneys or ureters. No hydronephrosis. No perinephric or periureteral stranding. Urinary bladder is unremarkable. GI AND BOWEL: Status post appendectomy. Stomach demonstrates no acute abnormality. There is no bowel obstruction. PERITONEUM AND RETROPERITONEUM: No ascites. No free air. VASCULATURE: Aorta is normal in caliber. LYMPH NODES: No lymphadenopathy. REPRODUCTIVE ORGANS: No acute abnormality. BONES AND SOFT TISSUES: L5-S1 PLIF. No acute osseous abnormality. No focal soft tissue abnormality. IMPRESSION: 1. No evidence of acute diverticulitis or diverticulitis-related complication in the abdomen or pelvis. Electronically signed by: Franky Stanford MD 06/02/2024 09:27 PM EST RP Workstation: [id number]   US  PELVIC COMPLETE W TRANSVAGINAL AND TORSION R/O Result Date: 06/02/2024 EXAM: US  Pelvis, Complete Transvaginal and Transabdominal without Doppler TECHNIQUE: Transabdominal and transvaginal pelvic duplex ultrasound using B-mode/gray scaled imaging without Doppler spectral analysis and color flow was obtained. COMPARISON: US  Pelvis Complete 10/16/2017 (Report only). CT abdomen and pelvis 11/14/23 CLINICAL HISTORY: Pelvic pain for 2 days and history of hysterectomy in 2008. FINDINGS: UTERUS: Status post hysterectomy. ENDOMETRIAL STRIPE: Not applicable due to hysterectomy. RIGHT OVARY: Right ovary measures 3.0 x 1.6 x 1.7 cm, volume 4 ml. Right ovary is within normal limits. There is normal arterial and venous Doppler flow. LEFT OVARY: Left ovary not visualized. FREE FLUID: No free fluid. LIMITATIONS: Limited evaluation due to bowel gas. IMPRESSION: 1. Status post hysterectomy with nonvisualization of the  left ovary and limited evaluation due to bowel gas. 2. No acute sonographic  abnormality is identified. Electronically signed by: Morgane Naveau MD 06/02/2024 05:34 PM EST RP Workstation: [id number]     PROCEDURES:  Critical Care performed: No  Procedures   MEDICATIONS ORDERED IN ED: Medications  promethazine  (PHENERGAN ) 6.25 mg/NS 50 mL IVPB (6.25 mg Intravenous New Bag/Given 06/02/24 2124)  ketorolac  (TORADOL ) 30 MG/ML injection 30 mg (30 mg Intramuscular Given 06/02/24 1422)  ondansetron  (ZOFRAN -ODT) disintegrating tablet 4 mg (4 mg Oral Given 06/02/24 1423)  oxyCODONE  (Oxy IR/ROXICODONE ) immediate release tablet 5 mg (5 mg Oral Given 06/02/24 1717)  sodium chloride  0.9 % bolus 1,000 mL (1,000 mLs Intravenous New Bag/Given 06/02/24 1957)  morphine  (PF) 4 MG/ML injection 4 mg (4 mg Intravenous Given 06/02/24 1956)  iohexol  (OMNIPAQUE ) 300 MG/ML solution 100 mL (100 mLs Intravenous Contrast Given 06/02/24 1946)     IMPRESSION / MDM / ASSESSMENT AND PLAN / ED COURSE  I reviewed the triage vital signs and the nursing notes.                             48 year old female presents for evaluation of abdominal pain.  Please see provider's note for differential diagnosis.  Patient's presentation is most consistent with acute complicated illness / injury requiring diagnostic workup.  CT scan obtained and was negative for acute abnormalities.  Suspect that patient's pain is due to gastroenteritis.  Recommended supportive care.  Will send nausea medication and pain medication.  Discussed close follow-up and return precautions to the ED.  Patient voiced understanding, all questions were answered and she was stable at discharge.   Clinical Course as of 06/02/24 2150  Wed Jun 02, 2024  1404 Unilateral pelvic pain x 2 days with nausea and low grade fever. Diarrhea yesterday that has resolved. No appendix. U/S r/o ovarian torsianl. Poss gasterentiritis  [MH]  1743 US  PELVIC COMPLETE W  TRANSVAGINAL AND TORSION R/O IMPRESSION: 1. Status post hysterectomy with nonvisualization of the left ovary and limited evaluation due to bowel gas. 2. No acute sonographic abnormality is identified.   [MH]  1939 Patient requesting additional pain medication. Will give morphine  and phenergan  as patient has had nausea with codeine  before. [LD]  2150 Patient reports an improvement in her pain after the additional pain medication.  She reports her nausea has improved as well. [LD]    Clinical Course User Index [LD] Cleaster Tinnie LABOR, PA-C [MH] Margrette, Myah A, PA-C    FINAL CLINICAL IMPRESSION(S) / ED DIAGNOSES   Final diagnoses:  Pelvic pain in female     Rx / DC Orders   ED Discharge Orders     None        Note:  This document was prepared using Dragon voice recognition software and may include unintentional dictation errors.    Cleaster Tinnie LABOR, PA-C 06/02/24 2150    Floy Roberts, MD 06/02/24 2159

## 2024-06-21 ENCOUNTER — Ambulatory Visit: Admitting: Urology

## 2024-07-05 ENCOUNTER — Other Ambulatory Visit: Admitting: Urology

## 2024-07-07 ENCOUNTER — Ambulatory Visit
Admission: RE | Admit: 2024-07-07 | Discharge: 2024-07-07 | Disposition: A | Source: Ambulatory Visit | Attending: Internal Medicine

## 2024-07-07 DIAGNOSIS — Z1231 Encounter for screening mammogram for malignant neoplasm of breast: Secondary | ICD-10-CM

## 2024-09-20 ENCOUNTER — Other Ambulatory Visit: Admitting: Urology
